# Patient Record
Sex: Female | Born: 1955 | Race: White | Hispanic: No | Marital: Married | Smoking: Former smoker
Health system: Southern US, Community
[De-identification: ages and names within clinical notes are randomized; demographics above are authoritative.]

## PROBLEM LIST (undated history)

## (undated) DIAGNOSIS — R05 Cough: Secondary | ICD-10-CM

## (undated) DIAGNOSIS — I10 Essential (primary) hypertension: Secondary | ICD-10-CM

## (undated) DIAGNOSIS — I6529 Occlusion and stenosis of unspecified carotid artery: Secondary | ICD-10-CM

## (undated) DIAGNOSIS — I5032 Chronic diastolic (congestive) heart failure: Secondary | ICD-10-CM

## (undated) DIAGNOSIS — Z951 Presence of aortocoronary bypass graft: Secondary | ICD-10-CM

## (undated) DIAGNOSIS — G479 Sleep disorder, unspecified: Secondary | ICD-10-CM

## (undated) DIAGNOSIS — Z8619 Personal history of other infectious and parasitic diseases: Secondary | ICD-10-CM

## (undated) DIAGNOSIS — R059 Cough, unspecified: Secondary | ICD-10-CM

## (undated) DIAGNOSIS — J45909 Unspecified asthma, uncomplicated: Secondary | ICD-10-CM

## (undated) DIAGNOSIS — T8859XA Other complications of anesthesia, initial encounter: Secondary | ICD-10-CM

## (undated) DIAGNOSIS — E785 Hyperlipidemia, unspecified: Secondary | ICD-10-CM

## (undated) DIAGNOSIS — I2510 Atherosclerotic heart disease of native coronary artery without angina pectoris: Secondary | ICD-10-CM

## (undated) DIAGNOSIS — E119 Type 2 diabetes mellitus without complications: Secondary | ICD-10-CM

## (undated) DIAGNOSIS — I779 Disorder of arteries and arterioles, unspecified: Secondary | ICD-10-CM

## (undated) DIAGNOSIS — Z9889 Other specified postprocedural states: Secondary | ICD-10-CM

## (undated) DIAGNOSIS — I739 Peripheral vascular disease, unspecified: Secondary | ICD-10-CM

## (undated) DIAGNOSIS — I639 Cerebral infarction, unspecified: Secondary | ICD-10-CM

## (undated) DIAGNOSIS — T4145XA Adverse effect of unspecified anesthetic, initial encounter: Secondary | ICD-10-CM

## (undated) DIAGNOSIS — G459 Transient cerebral ischemic attack, unspecified: Secondary | ICD-10-CM

## (undated) DIAGNOSIS — R112 Nausea with vomiting, unspecified: Secondary | ICD-10-CM

## (undated) DIAGNOSIS — I219 Acute myocardial infarction, unspecified: Secondary | ICD-10-CM

## (undated) HISTORY — DX: Chronic diastolic (congestive) heart failure: I50.32

## (undated) HISTORY — DX: Personal history of other infectious and parasitic diseases: Z86.19

## (undated) HISTORY — DX: Cerebral infarction, unspecified: I63.9

## (undated) HISTORY — DX: Atherosclerotic heart disease of native coronary artery without angina pectoris: I25.10

## (undated) HISTORY — DX: Occlusion and stenosis of unspecified carotid artery: I65.29

## (undated) HISTORY — PX: CORONARY ARTERY BYPASS GRAFT: SHX141

## (undated) HISTORY — PX: CHOLECYSTECTOMY: SHX55

## (undated) HISTORY — DX: Essential (primary) hypertension: I10

## (undated) HISTORY — DX: Presence of aortocoronary bypass graft: Z95.1

## (undated) HISTORY — DX: Hyperlipidemia, unspecified: E78.5

---

## 1979-09-20 HISTORY — PX: TUBAL LIGATION: SHX77

## 1989-05-20 HISTORY — PX: CHOLECYSTECTOMY: SHX55

## 2004-09-19 DIAGNOSIS — I639 Cerebral infarction, unspecified: Secondary | ICD-10-CM

## 2004-09-19 HISTORY — DX: Cerebral infarction, unspecified: I63.9

## 2011-08-26 ENCOUNTER — Encounter: Payer: Self-pay | Admitting: *Deleted

## 2011-08-26 ENCOUNTER — Emergency Department (HOSPITAL_BASED_OUTPATIENT_CLINIC_OR_DEPARTMENT_OTHER)
Admission: EM | Admit: 2011-08-26 | Discharge: 2011-08-26 | Disposition: A | Discharge status: Home / Self Care | Payer: BC Managed Care – PPO | Attending: Emergency Medicine | Admitting: Emergency Medicine

## 2011-08-26 ENCOUNTER — Emergency Department (INDEPENDENT_AMBULATORY_CARE_PROVIDER_SITE_OTHER): Payer: BC Managed Care – PPO

## 2011-08-26 DIAGNOSIS — R0602 Shortness of breath: Secondary | ICD-10-CM

## 2011-08-26 DIAGNOSIS — J189 Pneumonia, unspecified organism: Secondary | ICD-10-CM | POA: Insufficient documentation

## 2011-08-26 DIAGNOSIS — E119 Type 2 diabetes mellitus without complications: Secondary | ICD-10-CM | POA: Insufficient documentation

## 2011-08-26 DIAGNOSIS — I517 Cardiomegaly: Secondary | ICD-10-CM

## 2011-08-26 DIAGNOSIS — R609 Edema, unspecified: Secondary | ICD-10-CM

## 2011-08-26 LAB — BASIC METABOLIC PANEL
BUN: 15 mg/dL (ref 6–23)
CO2: 25 mEq/L (ref 19–32)
Chloride: 104 mEq/L (ref 96–112)
Creatinine, Ser: 0.6 mg/dL (ref 0.50–1.10)
GFR calc Af Amer: >90 mL/min (ref >90)
Glucose, Bld: 135 mg/dL — ABNORMAL HIGH (ref 70–99)
Potassium: 3.8 mEq/L (ref 3.5–5.1)

## 2011-08-26 LAB — CBC
HCT: 39.4 % (ref 36.0–46.0)
Hemoglobin: 13.3 g/dL (ref 12.0–15.0)
MCHC: 33.8 g/dL (ref 30.0–36.0)
RBC: 4.53 MIL/uL (ref 3.87–5.11)

## 2011-08-26 LAB — DIFFERENTIAL
Lymphocytes Relative: 5 % — ABNORMAL LOW (ref 12–46)
Lymphs Abs: 1 10*3/uL (ref 0.7–4.0)
Monocytes Absolute: 0.4 10*3/uL (ref 0.1–1.0)
Monocytes Relative: 2 % — ABNORMAL LOW (ref 3–12)
Neutro Abs: 17.7 10*3/uL — ABNORMAL HIGH (ref 1.7–7.7)
Neutrophils Relative %: 92 % — ABNORMAL HIGH (ref 43–77)

## 2011-08-26 MED ORDER — MOXIFLOXACIN HCL 400 MG PO TABS: 400.0000 mg | ORAL_TABLET | Freq: Every day | ORAL | Status: DC

## 2011-08-26 MED ORDER — DEXTROSE 5 % IV SOLN
1.0000 g | Freq: Once | INTRAVENOUS | Status: AC
  Administered 2011-08-26: 1 g via INTRAVENOUS
  Filled 2011-08-26: qty 10

## 2011-08-26 NOTE — Discharge Instructions (Signed)

## 2011-08-26 NOTE — ED Notes (Signed)
Walked to BR and tolerated well

## 2011-08-26 NOTE — ED Notes (Signed)
Asthmatic attack

## 2011-08-26 NOTE — ED Notes (Signed)
Feels better can now talk in full sentences

## 2011-08-26 NOTE — ED Notes (Signed)
Pt was tried off the oxygen to RA 21%; pt had an SPO2 drop of as low as 89% but maintained at 90%-93% on RA. Pt when she was auscultated had an oxygen saturation of 96% when taking big deep breaths.

## 2011-08-26 NOTE — ED Provider Notes (Signed)
History     CSN: [id number] Arrival date & time: 08/26/2011  5:26 PM   First MD Initiated Contact with Patient 08/26/11 1742      Chief Complaint  Patient presents with  . Asthma    (Consider location/radiation/quality/duration/timing/severity/associated sxs/prior treatment) HPI Comments: Pt states that she has been increasingly sob over the last month:pt states that she was diagnosed with pneumonia a month ago and they did a repeat x-ray and everything was normal:pt states that at that time they put her on oxygen at night:pt was given 2 treatment on the ems  Patient is a 55 y.o. female presenting with shortness of breath. The history is provided by the patient. No language interpreter was used.  Shortness of Breath  The current episode started more than 2 weeks ago. The problem occurs continuously. The problem has been gradually worsening. The problem is severe. The symptoms are relieved by beta-agonist inhalers. The symptoms are aggravated by activity. Associated symptoms include cough, shortness of breath and wheezing. Pertinent negatives include no chest pain and no fever. Her past medical history is significant for asthma.    Past Medical History  Diagnosis Date  . Asthma   . Diabetes mellitus     Past Surgical History  Procedure Date  . Cholecystectomy     Family History  Problem Relation Age of Onset  . Cancer Mother   . Heart failure Mother   . Diabetes Mother     History  Substance Use Topics  . Smoking status: Not on file  . Smokeless tobacco: Not on file  . Alcohol Use: No    OB History    Grav Para Term Preterm Abortions TAB SAB Ect Mult Living                  Review of Systems  Constitutional: Negative for fever.  Respiratory: Positive for cough, shortness of breath and wheezing.   Cardiovascular: Negative for chest pain.  All other systems reviewed and are negative.    Allergies  Review of patient's allergies indicates no known  allergies.  Home Medications   Current Outpatient Rx  Name Route Sig Dispense Refill  . ACETAMINOPHEN 500 MG PO TABS Oral Take 1,000 mg by mouth every 6 (six) hours as needed. For pain      . ALBUTEROL SULFATE (2.5 MG/3ML) 0.083% IN NEBU Nebulization Take 2.5 mg by nebulization 2 (two) times daily as needed. For shortness of breath and wheezing     . BUDESONIDE-FORMOTEROL FUMARATE 160-4.5 MCG/ACT IN AERO Inhalation Inhale 2 puffs into the lungs 2 (two) times daily.      . INSULIN GLARGINE 100 UNIT/ML [place] SOLN Subcutaneous Inject 100 Units into the skin 2 (two) times daily.      . INSULIN LISPRO (HUMAN) 100 UNIT/ML [place] SOLN Subcutaneous Inject 15 Units into the skin 3 (three) times daily before meals.        BP 155/61  Pulse 93  Temp(Src) 98.8 F (37.1 C) (Oral)  Resp 24  SpO2 93%  Physical Exam  Nursing note and vitals reviewed. Constitutional: She is oriented to person, place, and time. She appears well-developed and well-nourished.  HENT:  Head: Normocephalic and atraumatic.  Right Ear: External ear normal.  Left Ear: External ear normal.  Eyes: Pupils are equal, round, and reactive to light.  Neck: Normal range of motion. Neck supple.  Cardiovascular: Regular rhythm.   Pulmonary/Chest: No respiratory distress. She has wheezes.  Abdominal: Soft.  Musculoskeletal: Normal range of  motion.  Neurological: She is alert and oriented to person, place, and time.  Skin: Skin is warm and dry.  Psychiatric: She has a normal mood and affect.    ED Course  Procedures (including critical care time)  Labs Reviewed  CBC - Abnormal; Notable for the following:    WBC 19.1 (*)    All other components within normal limits  DIFFERENTIAL - Abnormal; Notable for the following:    Neutrophils Relative 92 (*)    Neutro Abs 17.7 (*)    Lymphocytes Relative 5 (*)    Monocytes Relative 2 (*)    All other components within normal limits  BASIC METABOLIC PANEL - Abnormal; Notable for the  following:    Glucose, Bld 135 (*)    All other components within normal limits   Dg Chest 2 View  08/26/2011  *RADIOLOGY REPORT*  Clinical Data: Shortness of breath.  CHEST - 2 VIEW  Comparison: None.  Findings: There is cardiomegaly.  The patient has bilateral alveolar and interstitial opacities.  Airspace opacity is most confluent in the right middle lobe.  No pneumothorax or effusion is identified.  IMPRESSION:   Cardiomegaly and interstitial edema.  More confluent opacity in the right middle lobe is likely due to more focal edema. Pneumonia cannot be excluded.  Original Report Authenticated By: Bernadene Bell. D'ALESSIO, M.D.     1. Pneumonia       MDM  Will treat pt for pneumonia:pt states that she has oxygen at home pt is 95-97% on oxygen:pt goes down to 89 % without JXB:JYNW have pt be on oxygen continuously:pt is requesting to go home;pt states that she is feeling better at this time:pt able to ambulate without actively being sob    Medical screening examination/treatment/procedure(s) were performed by non-physician practitioner and as supervising physician I was immediately available for consultation/collaboration. Osvaldo Human, M.D.      Teressa Lower, NP 08/26/11 2119  Carleene Cooper III, MD 08/26/11 2127

## 2011-08-29 MED FILL — Ipratropium-Albuterol Nebu Soln 0.5-2.5(3) MG/3ML: RESPIRATORY_TRACT | Qty: 3 | Status: AC

## 2011-08-29 MED FILL — Albuterol Sulfate Soln Nebu 0.083% (2.5 MG/3ML): RESPIRATORY_TRACT | Qty: 3 | Status: AC

## 2011-08-29 MED FILL — Methylprednisolone Sod Succ For Inj 125 MG (Base Equiv): INTRAMUSCULAR | Qty: 2 | Status: AC

## 2011-08-31 ENCOUNTER — Emergency Department (HOSPITAL_COMMUNITY)
Admission: EM | Admit: 2011-08-31 | Discharge: 2011-08-31 | Discharge status: Against Medical Advice | Payer: Self-pay | Attending: Emergency Medicine | Admitting: Emergency Medicine

## 2011-08-31 ENCOUNTER — Inpatient Hospital Stay (HOSPITAL_COMMUNITY)
Admission: EM | Admit: 2011-08-31 | Discharge: 2011-09-12 | DRG: 546 | Disposition: A | Discharge status: Home / Self Care | Payer: BC Managed Care – PPO | Source: Ambulatory Visit | Attending: Surgery | Admitting: Surgery

## 2011-08-31 ENCOUNTER — Other Ambulatory Visit: Payer: Self-pay

## 2011-08-31 ENCOUNTER — Encounter (HOSPITAL_COMMUNITY): Payer: Self-pay | Admitting: Internal Medicine

## 2011-08-31 ENCOUNTER — Emergency Department (HOSPITAL_COMMUNITY): Payer: BC Managed Care – PPO

## 2011-08-31 DIAGNOSIS — I10 Essential (primary) hypertension: Secondary | ICD-10-CM | POA: Diagnosis present

## 2011-08-31 DIAGNOSIS — I214 Non-ST elevation (NSTEMI) myocardial infarction: Principal | ICD-10-CM | POA: Diagnosis present

## 2011-08-31 DIAGNOSIS — Z79899 Other long term (current) drug therapy: Secondary | ICD-10-CM

## 2011-08-31 DIAGNOSIS — R0902 Hypoxemia: Secondary | ICD-10-CM | POA: Diagnosis present

## 2011-08-31 DIAGNOSIS — I509 Heart failure, unspecified: Secondary | ICD-10-CM | POA: Diagnosis present

## 2011-08-31 DIAGNOSIS — Z6841 Body Mass Index (BMI) 40.0 and over, adult: Secondary | ICD-10-CM

## 2011-08-31 DIAGNOSIS — E119 Type 2 diabetes mellitus without complications: Secondary | ICD-10-CM | POA: Diagnosis present

## 2011-08-31 DIAGNOSIS — R0989 Other specified symptoms and signs involving the circulatory and respiratory systems: Secondary | ICD-10-CM | POA: Insufficient documentation

## 2011-08-31 DIAGNOSIS — I5033 Acute on chronic diastolic (congestive) heart failure: Secondary | ICD-10-CM | POA: Diagnosis present

## 2011-08-31 DIAGNOSIS — T380X5A Adverse effect of glucocorticoids and synthetic analogues, initial encounter: Secondary | ICD-10-CM | POA: Diagnosis not present

## 2011-08-31 DIAGNOSIS — J189 Pneumonia, unspecified organism: Secondary | ICD-10-CM | POA: Diagnosis present

## 2011-08-31 DIAGNOSIS — J45909 Unspecified asthma, uncomplicated: Secondary | ICD-10-CM | POA: Diagnosis present

## 2011-08-31 DIAGNOSIS — J159 Unspecified bacterial pneumonia: Secondary | ICD-10-CM

## 2011-08-31 DIAGNOSIS — D72829 Elevated white blood cell count, unspecified: Secondary | ICD-10-CM | POA: Diagnosis present

## 2011-08-31 DIAGNOSIS — Z794 Long term (current) use of insulin: Secondary | ICD-10-CM

## 2011-08-31 DIAGNOSIS — K5900 Constipation, unspecified: Secondary | ICD-10-CM | POA: Diagnosis not present

## 2011-08-31 DIAGNOSIS — G4733 Obstructive sleep apnea (adult) (pediatric): Secondary | ICD-10-CM | POA: Diagnosis present

## 2011-08-31 DIAGNOSIS — R599 Enlarged lymph nodes, unspecified: Secondary | ICD-10-CM | POA: Diagnosis present

## 2011-08-31 DIAGNOSIS — I2510 Atherosclerotic heart disease of native coronary artery without angina pectoris: Secondary | ICD-10-CM | POA: Diagnosis present

## 2011-08-31 HISTORY — DX: Acute myocardial infarction, unspecified: I21.9

## 2011-08-31 HISTORY — DX: Nausea with vomiting, unspecified: R11.2

## 2011-08-31 HISTORY — DX: Sleep disorder, unspecified: G47.9

## 2011-08-31 HISTORY — DX: Other specified postprocedural states: Z98.890

## 2011-08-31 LAB — RAPID URINE DRUG SCREEN, HOSP PERFORMED
Benzodiazepines: NOT DETECTED
Cocaine: NOT DETECTED
Opiates: NOT DETECTED

## 2011-08-31 LAB — DIFFERENTIAL
Basophils Relative: 1 % (ref 0–1)
Eosinophils Relative: 2 % (ref 0–5)
Monocytes Absolute: 0.5 10*3/uL (ref 0.1–1.0)
Monocytes Relative: 4 % (ref 3–12)
Neutro Abs: 8 10*3/uL — ABNORMAL HIGH (ref 1.7–7.7)

## 2011-08-31 LAB — POCT I-STAT, CHEM 8
BUN: 14 mg/dL (ref 6–23)
Calcium, Ion: 1.12 mmol/L (ref 1.12–1.32)
Chloride: 109 mEq/L (ref 96–112)
Glucose, Bld: 306 mg/dL — ABNORMAL HIGH (ref 70–99)
HCT: 44 % (ref 36.0–46.0)
Potassium: 4.4 mEq/L (ref 3.5–5.1)

## 2011-08-31 LAB — TROPONIN I: Troponin I: <0.3 ng/mL (ref <0.30)

## 2011-08-31 LAB — URINALYSIS, ROUTINE W REFLEX MICROSCOPIC
Bilirubin Urine: NEGATIVE
Hgb urine dipstick: NEGATIVE
Specific Gravity, Urine: 1.021 (ref 1.005–1.030)
pH: 6 (ref 5.0–8.0)

## 2011-08-31 LAB — URINE MICROSCOPIC-ADD ON

## 2011-08-31 LAB — CBC
HCT: 39.5 % (ref 36.0–46.0)
Hemoglobin: 12.6 g/dL (ref 12.0–15.0)
MCH: 28.4 pg (ref 26.0–34.0)
MCHC: 31.9 g/dL (ref 30.0–36.0)
MCV: 89 fL (ref 78.0–100.0)
RDW: 13.8 % (ref 11.5–15.5)

## 2011-08-31 LAB — BLOOD GAS, ARTERIAL
Bicarbonate: 21.5 mEq/L (ref 20.0–24.0)
TCO2: 19.3 mmol/L (ref 0–100)
pCO2 arterial: 39.7 mmHg (ref 35.0–45.0)
pH, Arterial: 7.354 (ref 7.350–7.400)
pO2, Arterial: 271 mmHg — ABNORMAL HIGH (ref 80.0–100.0)

## 2011-08-31 LAB — COMPREHENSIVE METABOLIC PANEL
Albumin: 3.4 g/dL — ABNORMAL LOW (ref 3.5–5.2)
BUN: 13 mg/dL (ref 6–23)
CO2: 21 mEq/L (ref 19–32)
Calcium: 9 mg/dL (ref 8.4–10.5)
Chloride: 102 mEq/L (ref 96–112)
Creatinine, Ser: 0.75 mg/dL (ref 0.50–1.10)
GFR calc non Af Amer: >90 mL/min (ref >90)
Total Bilirubin: 0.1 mg/dL — ABNORMAL LOW (ref 0.3–1.2)

## 2011-08-31 LAB — GLUCOSE, CAPILLARY: Glucose-Capillary: 225 mg/dL — ABNORMAL HIGH (ref 70–99)

## 2011-08-31 LAB — PRO B NATRIURETIC PEPTIDE: Pro B Natriuretic peptide (BNP): 320.3 pg/mL — ABNORMAL HIGH (ref 0–125)

## 2011-08-31 MED ORDER — AZITHROMYCIN 500 MG IV SOLR
500.0000 mg | Freq: Once | INTRAVENOUS | Status: AC
  Administered 2011-08-31: 500 mg via INTRAVENOUS
  Filled 2011-08-31: qty 500

## 2011-08-31 MED ORDER — FUROSEMIDE 10 MG/ML IJ SOLN
40.0000 mg | Freq: Once | INTRAMUSCULAR | Status: AC
  Administered 2011-08-31: 40 mg via INTRAVENOUS
  Filled 2011-08-31: qty 4

## 2011-08-31 MED ORDER — DEXTROSE 5 % IV SOLN
1.0000 g | Freq: Once | INTRAVENOUS | Status: AC
  Administered 2011-08-31: 1 g via INTRAVENOUS
  Filled 2011-08-31: qty 10

## 2011-08-31 MED ORDER — IOHEXOL 300 MG/ML  SOLN
100.0000 mL | Freq: Once | INTRAMUSCULAR | Status: AC | PRN
  Administered 2011-08-31: 100 mL via INTRAVENOUS

## 2011-08-31 NOTE — ED Notes (Signed)
Ct and Rt made aware that pt is ready to go for CT angio

## 2011-08-31 NOTE — ED Notes (Signed)
Per CT pt need IV access above the hand and wrist

## 2011-08-31 NOTE — ED Notes (Signed)
Per ems. Found pt sitting in her car at a gas station, obvious respiratory distress. Extremely diaphoretic and rales in all fields. Applied cpap, immediately lung sounds started to clear, but not compeltely. One nitroglycerin. 20 g R hand. Alert and oriented x4 whole time. Pt reported hx of dx of pneumonia last Friday. Denies allergies.

## 2011-08-31 NOTE — ED Notes (Signed)
Per Dr. Conley Rolls pt transported to CT on 100% NRB.  Pt spo2 maintained between 94-100%.  Per Dr. Conley Rolls pt left on 100% NRB and is tolerating well. VS stable at this time.  RT will continue to follow.

## 2011-08-31 NOTE — Progress Notes (Signed)
Documented on wrong pt.Needed to document on a [place]

## 2011-08-31 NOTE — ED Notes (Signed)
Pt and bedding changed due to pt being incontinent of urine. Foley placed by assigned RN

## 2011-08-31 NOTE — ED Notes (Signed)
MD at bedside. Dr. Le  

## 2011-08-31 NOTE — ED Provider Notes (Signed)
History     CSN: [id number] Arrival date & time: 08/31/2011  5:28 PM   First MD Initiated Contact with Patient 08/31/11 1734      Chief Complaint  Patient presents with  . Respiratory Distress    (Consider location/radiation/quality/duration/timing/severity/associated sxs/prior treatment) Patient is a 55 y.o. female presenting with shortness of breath. The history is provided by the patient.  Shortness of Breath  The current episode started 3 to 5 days ago. The onset was gradual. The problem occurs continuously. The problem has been rapidly worsening. The problem is severe. The symptoms are relieved by nothing. The symptoms are aggravated by nothing. Associated symptoms include orthopnea, cough and shortness of breath. Pertinent negatives include no chest pain, no chest pressure, no fever, no rhinorrhea and no wheezing. She has been behaving normally. Urine output has been normal. The last void occurred less than 6 hours ago. There were no sick contacts. Recently, medical care has been given by the PCP. Services received include medications given (Treated with Avelox for 5 days in the emergency department).   The patient was driving her car home tonight, when she had sudden worsening of her breathing problem. She was assessed in the field by EMS and put on CPAP. She reports feeling better after on CPAP. She denies any chest pain in the last week. She has chronic lower extremity edema. She feels she has not gotten any better on the Avelox.  Past Medical History  Diagnosis Date  . Asthma   . Diabetes mellitus     Past Surgical History  Procedure Date  . Cholecystectomy     Family History  Problem Relation Age of Onset  . Cancer Mother   . Heart failure Mother   . Diabetes Mother     History  Substance Use Topics  . Smoking status: Not on file  . Smokeless tobacco: Not on file  . Alcohol Use: No    OB History    Grav Para Term Preterm Abortions TAB SAB Ect Mult Living               Review of Systems  Constitutional: Negative for fever.  HENT: Negative for rhinorrhea.   Respiratory: Positive for cough and shortness of breath. Negative for wheezing.   Cardiovascular: Positive for orthopnea. Negative for chest pain.  All other systems reviewed and are negative.    Allergies  Review of patient's allergies indicates no known allergies.  Home Medications   Current Outpatient Rx  Name Route Sig Dispense Refill  . ACETAMINOPHEN 500 MG PO TABS Oral Take 1,000 mg by mouth every 6 (six) hours as needed. For pain      . ALBUTEROL SULFATE (2.5 MG/3ML) 0.083% IN NEBU Nebulization Take 2.5 mg by nebulization 2 (two) times daily as needed. For shortness of breath and wheezing     . ALBUTEROL SULFATE (5 MG/ML) 0.5% IN NEBU Nebulization Take 10 mg by nebulization once.      . BUDESONIDE-FORMOTEROL FUMARATE 160-4.5 MCG/ACT IN AERO Inhalation Inhale 2 puffs into the lungs 2 (two) times daily.      . INSULIN GLARGINE 100 UNIT/ML [place] SOLN Subcutaneous Inject 100 Units into the skin 2 (two) times daily.      . INSULIN LISPRO (HUMAN) 100 UNIT/ML [place] SOLN Subcutaneous Inject 15 Units into the skin 3 (three) times daily before meals.      . IPRATROPIUM BROMIDE 0.02 % IN SOLN Nebulization Take 500 mcg by nebulization once.      [name]  METHYLPREDNISOLONE SODIUM SUCC 125 MG IJ SOLR Intravenous Inject 125 mg into the vein once.      [name] MOXIFLOXACIN HCL 400 MG PO TABS Oral Take 1 tablet (400 mg total) by mouth daily. 5 tablet 0    BP 127/78  Pulse 79  Resp 29  SpO2 100%  Physical Exam  Constitutional: She is oriented to person, place, and time. She appears well-developed.       Obese  HENT:  Head: Normocephalic and atraumatic.  Right Ear: External ear normal.  Left Ear: External ear normal.  Eyes: Conjunctivae and EOM are normal. Pupils are equal, round, and reactive to light. Left eye exhibits no discharge.  Neck: Normal range of motion. Neck supple.  Cardiovascular: Normal  rate and regular rhythm.   Pulmonary/Chest: She is in respiratory distress. She has no wheezes. She has no rales. She exhibits no tenderness.  Abdominal: Soft. Bowel sounds are normal. She exhibits no distension. There is no tenderness.  Musculoskeletal: She exhibits edema (Symmetric lower extremity edema, mild.). She exhibits no tenderness.  Neurological: She is alert and oriented to person, place, and time. No cranial nerve deficit. She exhibits normal muscle tone. Coordination normal.  Skin: Skin is warm and dry.  Psychiatric: She has a normal mood and affect. Her behavior is normal. Judgment and thought content normal.    ED Course  Procedures (including critical care time)  CRITICAL CARE Performed by: Mancel Bale L   Total critical care time: 50  Critical care time was exclusive of separately billable procedures and treating other patients.  Critical care was necessary to treat or prevent imminent or life-threatening deterioration.  Critical care was time spent personally by me on the following activities: development of treatment plan with patient and/or surrogate as well as nursing, discussions with consultants, evaluation of patient's response to treatment, examination of patient, obtaining history from patient or surrogate, ordering and performing treatments and interventions, ordering and review of laboratory studies, ordering and review of radiographic studies, pulse oximetry and re-evaluation of patient's condition.  Date: 08/31/2011  Rate: 103  Rhythm: sinus tachycardia  QRS Axis: left  Intervals: normal  ST/T Wave abnormalities: nonspecific ST changes  Conduction Disutrbances:left anterior fascicular block  Narrative Interpretation: LVH, Q wave anterior  Old EKG Reviewed: none available  18:30 Initial ED management. Change from CPAP to BiPAP. Arterial blood gas checked and is reassuring. Weaning from BiPAP underway at this time. 20:30- she did not tolerate weaning  from BiPAP. IV Lasix ordered for apparent CHF. IV antibiotics, started for persistent community-acquired pneumonia. Patient maintained good blood pressure during observation, but was persistently tachypneic.     Labs Reviewed  CBC - Abnormal; Notable for the following:    WBC 10.7 (*)    All other components within normal limits  DIFFERENTIAL - Abnormal; Notable for the following:    Neutro Abs 8.0 (*)    All other components within normal limits  COMPREHENSIVE METABOLIC PANEL - Abnormal; Notable for the following:    Glucose, Bld 298 (*)    Albumin 3.4 (*)    AST 70 (*)    ALT 54 (*)    Alkaline Phosphatase 194 (*)    Total Bilirubin 0.1 (*)    All other components within normal limits  URINE RAPID DRUG SCREEN (HOSP PERFORMED) - Abnormal; Notable for the following:    Barbiturates POSITIVE (*)    All other components within normal limits  URINALYSIS, ROUTINE W REFLEX MICROSCOPIC - Abnormal; Notable for the following:  Glucose, UA 250 (*)    Protein, ur 100 (*)    All other components within normal limits  BLOOD GAS, ARTERIAL - Abnormal; Notable for the following:    pO2, Arterial 271.0 (*)    Acid-base deficit 3.2 (*)    All other components within normal limits  PRO B NATRIURETIC PEPTIDE - Abnormal; Notable for the following:    Pro B Natriuretic peptide (BNP) 320.3 (*)    All other components within normal limits  GLUCOSE, CAPILLARY - Abnormal; Notable for the following:    Glucose-Capillary 295 (*)    All other components within normal limits  POCT I-STAT TROPONIN I - Abnormal; Notable for the following:    Troponin i, poc 0.28 (*)    All other components within normal limits  POCT I-STAT, CHEM 8 - Abnormal; Notable for the following:    Glucose, Bld 306 (*)    All other components within normal limits  URINE MICROSCOPIC-ADD ON - Abnormal; Notable for the following:    Squamous Epithelial / LPF FEW (*)    Casts HYALINE CASTS (*)    All other components within normal  limits  TROPONIN I  I-STAT, CHEM 8  I-STAT TROPONIN I  URINE CULTURE  CULTURE, BLOOD (ROUTINE X 2)  CULTURE, BLOOD (ROUTINE X 2)   Dg Chest Port 1 View  08/31/2011  *RADIOLOGY REPORT*  Clinical Data: Short of breath.  Respiratory distress  PORTABLE CHEST - 1 VIEW  Comparison: 08/26/2011  Findings: Progression of diffuse bilateral airspace disease.  This may be due to pulmonary edema versus pneumonia.  No significant pleural effusion.  IMPRESSION: Progression of diffuse bilateral airspace disease, probable pulmonary edema.  Original Report Authenticated By: Camelia Phenes, M.D.     1. CHF (congestive heart failure)   2. Community acquired bacterial pneumonia       MDM  Nonspecific, shortness of breath with concern for combined CHF, and pneumonia. Patient is in persistent respiratory distress. Will have to be admitted for further treatment and management.        Flint Melter, MD 08/31/11 2122

## 2011-08-31 NOTE — ED Notes (Signed)
JYN:WGNF<AO> Expected date:08/31/11<BR> Expected time: 5:25 PM<BR> Means of arrival:Ambulance<BR> Comments:<BR> EMS 61 GC - Pneumonia/low sats/need respiratory

## 2011-08-31 NOTE — ED Notes (Signed)
Dr. Conley Rolls notified of pt being placed on NRB and tolerating well. Pt. Maintaining sats at 100%. OK for pt to stay on NRB per Dr. Conley Rolls.

## 2011-08-31 NOTE — ED Notes (Signed)
Dr. Conley Rolls notified of pt accucheck of 225 mg/dl. No further orders received.

## 2011-08-31 NOTE — ED Notes (Signed)
Pt is aware of the need for urine. Reporting EMT states that MD was asked for foley and MD wanted to wait.

## 2011-08-31 NOTE — ED Notes (Signed)
Patient transported to CT on Non rebreather per Dr Conley Rolls

## 2011-08-31 NOTE — ED Notes (Signed)
Pt. Denies chest pain. Pt. Has no complaints at this time. Continues on Bi-Pap.

## 2011-08-31 NOTE — ED Notes (Signed)
Pt. Confirms that she takes 100U Lantus twice daily. Dr. Conley Rolls aware. Pt a/o x 3. No acute distress.

## 2011-09-01 ENCOUNTER — Encounter (HOSPITAL_COMMUNITY): Payer: Self-pay

## 2011-09-01 DIAGNOSIS — I509 Heart failure, unspecified: Secondary | ICD-10-CM

## 2011-09-01 DIAGNOSIS — I214 Non-ST elevation (NSTEMI) myocardial infarction: Secondary | ICD-10-CM | POA: Diagnosis present

## 2011-09-01 DIAGNOSIS — I369 Nonrheumatic tricuspid valve disorder, unspecified: Secondary | ICD-10-CM

## 2011-09-01 LAB — BASIC METABOLIC PANEL
BUN: 15 mg/dL (ref 6–23)
Chloride: 101 mEq/L (ref 96–112)
Creatinine, Ser: 0.8 mg/dL (ref 0.50–1.10)
GFR calc Af Amer: >90 mL/min (ref >90)
Glucose, Bld: 232 mg/dL — ABNORMAL HIGH (ref 70–99)

## 2011-09-01 LAB — CARDIAC PANEL(CRET KIN+CKTOT+MB+TROPI)
CK, MB: 18 ng/mL (ref 0.3–4.0)
Relative Index: 7.8 — ABNORMAL HIGH (ref 0.0–2.5)
Relative Index: 7.7 — ABNORMAL HIGH (ref 0.0–2.5)
Total CK: 234 U/L — ABNORMAL HIGH (ref 7–177)
Troponin I: 2.71 ng/mL (ref <0.30)

## 2011-09-01 LAB — CBC
HCT: 38.3 % (ref 36.0–46.0)
Hemoglobin: 12.4 g/dL (ref 12.0–15.0)
MCH: 28.8 pg (ref 26.0–34.0)
MCHC: 32.4 g/dL (ref 30.0–36.0)
MCV: 89.1 fL (ref 78.0–100.0)
RDW: 14.2 % (ref 11.5–15.5)

## 2011-09-01 LAB — APTT: aPTT: 29 seconds (ref 24–37)

## 2011-09-01 LAB — PROTIME-INR: INR: 1.06 (ref 0.00–1.49)

## 2011-09-01 LAB — GLUCOSE, CAPILLARY: Glucose-Capillary: 249 mg/dL — ABNORMAL HIGH (ref 70–99)

## 2011-09-01 LAB — HEMOGLOBIN A1C: Hgb A1c MFr Bld: 8.1 % — ABNORMAL HIGH (ref <5.7)

## 2011-09-01 MED ORDER — DEXTROSE 5 % IV SOLN
500.0000 mg | INTRAVENOUS | Status: DC
  Administered 2011-09-01: 500 mg via INTRAVENOUS
  Filled 2011-09-01 (×2): qty 500

## 2011-09-01 MED ORDER — DEXTROSE 5 % IV SOLN
1.0000 g | INTRAVENOUS | Status: DC
  Administered 2011-09-01: 1 g via INTRAVENOUS
  Filled 2011-09-01 (×2): qty 10

## 2011-09-01 MED ORDER — HEPARIN BOLUS VIA INFUSION
4000.0000 [IU] | Freq: Once | INTRAVENOUS | Status: DC
  Administered 2011-09-01: 4000 [IU] via INTRAVENOUS
  Filled 2011-09-01: qty 4000

## 2011-09-01 MED ORDER — INSULIN GLARGINE 100 UNIT/ML ~~LOC~~ SOLN
50.0000 [IU] | Freq: Two times a day (BID) | SUBCUTANEOUS | Status: DC
  Administered 2011-09-01 – 2011-09-03 (×3): 50 [IU] via SUBCUTANEOUS
  Filled 2011-09-01: qty 3
  Filled 2011-09-01: qty 0.5

## 2011-09-01 MED ORDER — SODIUM CHLORIDE 0.9 % IJ SOLN
3.0000 mL | Freq: Two times a day (BID) | INTRAMUSCULAR | Status: DC
  Administered 2011-09-03 – 2011-09-04 (×2): 3 mL via INTRAVENOUS

## 2011-09-01 MED ORDER — FUROSEMIDE 10 MG/ML IJ SOLN
40.0000 mg | Freq: Every day | INTRAMUSCULAR | Status: DC
  Administered 2011-09-01: 40 mg via INTRAVENOUS
  Filled 2011-09-01 (×2): qty 2
  Filled 2011-09-01 (×2): qty 4

## 2011-09-01 MED ORDER — CARVEDILOL 3.125 MG PO TABS
3.1250 mg | ORAL_TABLET | Freq: Two times a day (BID) | ORAL | Status: DC
  Administered 2011-09-01 – 2011-09-06 (×10): 3.125 mg via ORAL
  Filled 2011-09-01 (×16): qty 1

## 2011-09-01 MED ORDER — ASPIRIN EC 325 MG PO TBEC
325.0000 mg | DELAYED_RELEASE_TABLET | Freq: Every day | ORAL | Status: DC
  Administered 2011-09-01: 325 mg via ORAL
  Filled 2011-09-01 (×2): qty 1

## 2011-09-01 MED ORDER — SODIUM CHLORIDE 0.9 % IJ SOLN: 3.0000 mL | INTRAMUSCULAR | Status: DC | PRN

## 2011-09-01 MED ORDER — HEPARIN SOD (PORCINE) IN D5W 100 UNIT/ML IV SOLN
1650.0000 [IU]/h | INTRAVENOUS | Status: DC
  Administered 2011-09-01: 1200 [IU]/h via INTRAVENOUS
  Administered 2011-09-02: 1650 [IU]/h via INTRAVENOUS
  Filled 2011-09-01 (×4): qty 250

## 2011-09-01 MED ORDER — ACETAMINOPHEN 500 MG PO TABS: 1000.0000 mg | ORAL_TABLET | Freq: Four times a day (QID) | ORAL | Status: DC | PRN

## 2011-09-01 MED ORDER — SODIUM CHLORIDE 0.9 % IV SOLN: 250.0000 mL | INTRAVENOUS | Status: DC | PRN

## 2011-09-01 MED ORDER — METHYLPREDNISOLONE SODIUM SUCC 125 MG IJ SOLR
60.0000 mg | Freq: Four times a day (QID) | INTRAMUSCULAR | Status: DC
  Administered 2011-09-01 (×3): 60 mg via INTRAVENOUS
  Administered 2011-09-01: 09:00:00 via INTRAVENOUS
  Administered 2011-09-02: 60 mg via INTRAVENOUS
  Filled 2011-09-01 (×2): qty 2
  Filled 2011-09-01: qty 4
  Filled 2011-09-01 (×6): qty 2

## 2011-09-01 MED ORDER — ASPIRIN 81 MG PO CHEW
324.0000 mg | CHEWABLE_TABLET | ORAL | Status: AC
  Administered 2011-09-02: 324 mg via ORAL
  Filled 2011-09-01: qty 4

## 2011-09-01 MED ORDER — DIAZEPAM 5 MG PO TABS
5.0000 mg | ORAL_TABLET | ORAL | Status: AC
  Administered 2011-09-02: 5 mg via ORAL
  Filled 2011-09-01: qty 1

## 2011-09-01 MED ORDER — ROSUVASTATIN CALCIUM 40 MG PO TABS
40.0000 mg | ORAL_TABLET | Freq: Every day | ORAL | Status: DC
  Administered 2011-09-04 – 2011-09-11 (×7): 40 mg via ORAL
  Filled 2011-09-01 (×11): qty 1

## 2011-09-01 MED ORDER — ALBUTEROL SULFATE (5 MG/ML) 0.5% IN NEBU
2.5000 mg | INHALATION_SOLUTION | Freq: Four times a day (QID) | RESPIRATORY_TRACT | Status: DC
  Administered 2011-09-01 – 2011-09-03 (×8): 2.5 mg via RESPIRATORY_TRACT
  Filled 2011-09-01 (×8): qty 0.5

## 2011-09-01 MED ORDER — MORPHINE SULFATE 2 MG/ML IJ SOLN: 2.0000 mg | INTRAMUSCULAR | Status: DC | PRN

## 2011-09-01 MED ORDER — ROSUVASTATIN CALCIUM 40 MG PO TABS
40.0000 mg | ORAL_TABLET | ORAL | Status: AC
  Administered 2011-09-01: 40 mg via ORAL
  Filled 2011-09-01 (×2): qty 1

## 2011-09-01 MED ORDER — SODIUM CHLORIDE 0.9 % IJ SOLN
3.0000 mL | Freq: Two times a day (BID) | INTRAMUSCULAR | Status: DC
  Administered 2011-09-01 – 2011-09-04 (×4): 3 mL via INTRAVENOUS

## 2011-09-01 MED ORDER — BUDESONIDE-FORMOTEROL FUMARATE 160-4.5 MCG/ACT IN AERO
2.0000 | INHALATION_SPRAY | Freq: Two times a day (BID) | RESPIRATORY_TRACT | Status: DC
  Administered 2011-09-01 – 2011-09-03 (×3): 2 via RESPIRATORY_TRACT
  Filled 2011-09-01 (×2): qty 6

## 2011-09-01 MED ORDER — INSULIN ASPART 100 UNIT/ML ~~LOC~~ SOLN
0.0000 [IU] | Freq: Three times a day (TID) | SUBCUTANEOUS | Status: DC
  Administered 2011-09-01: 08:00:00 via SUBCUTANEOUS

## 2011-09-01 MED ORDER — INSULIN GLARGINE 100 UNIT/ML ~~LOC~~ SOLN: 100.0000 [IU] | Freq: Every day | SUBCUTANEOUS | Status: DC

## 2011-09-01 MED ORDER — INSULIN ASPART 100 UNIT/ML ~~LOC~~ SOLN
0.0000 [IU] | SUBCUTANEOUS | Status: DC
  Administered 2011-09-01 – 2011-09-02 (×5): 8 [IU] via SUBCUTANEOUS
  Filled 2011-09-01: qty 0.15

## 2011-09-01 MED ORDER — ALBUTEROL SULFATE (5 MG/ML) 0.5% IN NEBU: 2.5000 mg | INHALATION_SOLUTION | RESPIRATORY_TRACT | Status: DC | PRN

## 2011-09-01 MED ORDER — INSULIN GLARGINE 100 UNIT/ML ~~LOC~~ SOLN
100.0000 [IU] | Freq: Two times a day (BID) | SUBCUTANEOUS | Status: DC
  Administered 2011-09-01 (×2): 100 [IU] via SUBCUTANEOUS
  Filled 2011-09-01 (×2): qty 1

## 2011-09-01 MED ORDER — INSULIN ASPART 100 UNIT/ML ~~LOC~~ SOLN
6.0000 [IU] | Freq: Three times a day (TID) | SUBCUTANEOUS | Status: DC
  Administered 2011-09-01: 08:00:00 via SUBCUTANEOUS

## 2011-09-01 MED ORDER — INSULIN ASPART 100 UNIT/ML ~~LOC~~ SOLN: 0.0000 [IU] | Freq: Every day | SUBCUTANEOUS | Status: DC

## 2011-09-01 MED ORDER — ENOXAPARIN SODIUM 40 MG/0.4ML ~~LOC~~ SOLN: 40.0000 mg | SUBCUTANEOUS | Status: DC

## 2011-09-01 NOTE — ED Notes (Signed)
Pt transferred to dept.

## 2011-09-01 NOTE — ED Notes (Signed)
Dr Conley Rolls at bs with pt to discuss Ct results. Reports that he will change order for pt to go to tele bed

## 2011-09-01 NOTE — Progress Notes (Signed)
ANTICOAGULATION CONSULT NOTE - Initial Consult  Pharmacy Consult for IV heparin Indication: ACS/STEMI  No Known Allergies  Patient Measurements:   Patient's reported weight = 323 lbs = 146.8 kg Ideal body weight = 61.6 Heparin dosing weight = 98 kg  Vital Signs: Temp: 98.7 F (37.1 C) (12/13 1145) BP: 146/55 mmHg (12/13 1145) Pulse Rate: 97  (12/13 1145)  Labs:  Basename 09/01/11 1013 09/01/11 0500 08/31/11 1802 08/31/11 1737 08/31/11 1712  HGB -- 12.4 15.0 -- --  HCT -- 38.3 44.0 39.5 --  PLT -- 250 -- 235 --  APTT -- -- -- -- --  LABPROT -- -- -- -- --  INR -- -- -- -- --  HEPARINUNFRC -- -- -- -- --  CREATININE -- 0.80 0.80 0.75 --  CKTOTAL 339* -- -- -- --  CKMB 26.4* -- -- -- --  TROPONINI 1.44* -- -- -- <0.30   CrCl is unknown because there is no height on file for the current visit.  Medical History: Past Medical History  Diagnosis Date  . Asthma   . Diabetes mellitus   . PONV (postoperative nausea and vomiting)   . Sleep disturbance     Had sleep study several years ago, wasn't told she has OSA but told she needs O2 at night  . Abnormal stress test     Failed stress test 3 years ago in [facility] for cough -  had f/u echo showing a "valve didn't open well" . No cath    Medications:  Scheduled:    . albuterol  2.5 mg Nebulization Q6H  . aspirin EC  325 mg Oral Daily  . azithromycin (ZITHROMAX) 500 MG IVPB  500 mg Intravenous Once  . azithromycin  500 mg Intravenous Q24H  . budesonide-formoterol  2 puff Inhalation BID  . carvedilol  3.125 mg Oral BID WC  . cefTRIAXone (ROCEPHIN)  IV  1 g Intravenous Once  . cefTRIAXone (ROCEPHIN)  IV  1 g Intravenous Q24H  . furosemide  40 mg Intravenous Once  . furosemide  40 mg Intravenous Daily  . insulin aspart  0-15 Units Subcutaneous Q4H  . insulin glargine  50 Units Subcutaneous BID  . methylPREDNISolone (SOLU-MEDROL) injection  60 mg Intravenous Q6H  . sodium chloride  3 mL Intravenous Q12H  . DISCONTD:  enoxaparin  40 mg Subcutaneous Q24H  . DISCONTD: insulin aspart  0-20 Units Subcutaneous TID WC  . DISCONTD: insulin aspart  0-5 Units Subcutaneous QHS  . DISCONTD: insulin aspart  6 Units Subcutaneous TID WC  . DISCONTD: insulin glargine  100 Units Subcutaneous BID  . DISCONTD: insulin glargine  100 Units Subcutaneous QHS   Infusions:    Assessment:  55YOF admitted with SOB found to have elevated cardiac enzymes.  Pt is to start IV heparin to r/o ACS/STEMI.    CBC stable  Goal of Therapy:  Heparin level 0.3-0.7 units/ml   Plan:   RN notified to obtain baseline PT/INR and PTT  Heparin 4000 units IV x 1 as bolus then heparin 1200 units/hr  Heparin level 6 hours after start of infusion  Daily heparin level and CBC  Marshall Cork, Jerell Demery Thi 09/01/2011,2:43 PM

## 2011-09-01 NOTE — Progress Notes (Signed)
ANTICOAGULATION CONSULT NOTE - Follow Up Consult  Pharmacy Consult for Heparin Indication: chest pain/ACS  No Known Allergies  Patient Measurements: Weight: 314 lb 13.1 oz (142.8 kg) Adjusted Body Weight:   Vital Signs: Temp: 98.7 F (37.1 C) (12/13 1145) BP: 146/55 mmHg (12/13 1145) Pulse Rate: 97  (12/13 1145)  Labs:  Basename 09/01/11 2225 09/01/11 1630 09/01/11 1503 09/01/11 1013 09/01/11 0500 08/31/11 1802 08/31/11 1737 08/31/11 1712  HGB -- -- -- -- 12.4 15.0 -- --  HCT -- -- -- -- 38.3 44.0 39.5 --  PLT -- -- -- -- 250 -- 235 --  APTT -- -- 29 -- -- -- -- --  LABPROT -- -- 14.0 -- -- -- -- --  INR -- -- 1.06 -- -- -- -- --  HEPARINUNFRC <0.10* -- -- -- -- -- -- --  CREATININE -- -- -- -- 0.80 0.80 0.75 --  CKTOTAL -- 234* -- 339* -- -- -- --  CKMB -- 18.0* -- 26.4* -- -- -- --  TROPONINI -- 2.71* -- 1.44* -- -- -- <0.30   CrCl is unknown because there is no height on file for the current visit.    Assessment: Patient on Heparin for ACS, 1st level is below goal.  No issues per RN.  Goal of Therapy:  Heparin level 0.3-0.7 units/ml   Plan:  Increase heparin to 1650 units/hr, recheck level at 0800  Darlina Guys, Jacquenette Shone Crowford 09/01/2011,11:37 PM

## 2011-09-01 NOTE — Consult Note (Signed)
CARDIOLOGY CONSULT NOTE  Patient ID: Felicia Rivas, MRN: [id number], DOB/AGE: 1956/04/28 55 y.o. Admit date: 08/31/2011 Date of Consult: 09/01/2011  Primary Cardiologist: New  Chief Complaint: SOB Reason for Consultation: NSTEMI, CHF  HPI: 45 y/o F with hx DM, abnormal stress test 3 yrs ago but no cath presented to [facility] c/o SOB. She works as a Runner, broadcasting/film/video at [employer] and after getting off work, she was driving home and became progressively SOB to the point where she had to stop at a gas station to call for help. She was also very diaphoretic. She denies any chest pain either now or in the past. She doesn't typically get DOE, except when she's sick with a URI. She hasn't felt well for the past several weeks, with increasing LEE& dyspnea on exertion. She saw her PCP on 12/7 for SOB/cough and was dx'd with PNA and given IV steroids/abx then prescribed 5 days of Avelox which she completed. No fevers, chills, nausea, vomiting, syncope or palpitations. She notes that her weight was 290 on 12/7 and then at her f/u on 12/10, it was 323. She hasn't noticed any early satiety or change in the way her clothing fits, but says she wears loose clothing in general. She states she is urinating normally. She received 2 doses of IV Lasix 40mg  each thus far, and she can't say whether it has made a difference. BNP was 320.  Initial EKG 12/12 17:28pm showed sinus tach with marked ST depression V4-V6, ST depression avL. Difficult to assess for ST elevation given wandering baseline. F/u EKG today shows improvement of these findings with mild ST depression V5-V6 with general TW flattening V5, biphasic T in V6 with evidence for LVH.   Past Medical History  Diagnosis Date  . Asthma   . Diabetes mellitus   . PONV (postoperative nausea and vomiting)   . Sleep disturbance     Had sleep study several years ago, wasn't told she has OSA but told she needs O2 at night  . Abnormal stress test     Failed stress test 3 years  ago in [facility] for cough -  had f/u echo showing a "valve didn't open well" . No cath      Surgical History:  Past Surgical History  Procedure Date  . Cholecystectomy      Home Meds: Medication Sig  acetaminophen (TYLENOL) 500 MG tablet Take 1,000 mg by mouth every 6 (six) hours as needed. For pain    albuterol (PROVENTIL) (2.5 MG/3ML) 0.083% nebulizer solution Take 2.5 mg by nebulization 2 (two) times daily as needed. For shortness of breath and wheezing   budesonide-formoterol (SYMBICORT) 160-4.5 MCG/ACT inhaler Inhale 2 puffs into the lungs 2 (two) times daily.   insulin glargine (LANTUS) 100 UNIT/ML injection Inject 100 Units into the skin 2 (two) times daily.    insulin lispro (HUMALOG) 100 UNIT/ML injection Inject 15 Units into the skin 3 (three) times daily before meals.    moxifloxacin (AVELOX) 400 MG tablet Take 400 mg by mouth daily. Finished 08/31/11 after a 5 day course of therapy.   albuterol (PROVENTIL) (5 MG/ML) 0.5% nebulizer solution Take 10 mg by nebulization once.    ipratropium (ATROVENT) 0.02 % nebulizer solution Take 500 mcg by nebulization once.    methylPREDNISolone sodium succinate (SOLU-MEDROL) 125 MG injection Inject 125 mg into the vein once.      Inpatient Medications:    . albuterol  2.5 mg Nebulization Q6H  . aspirin EC  325 mg Oral Daily  . azithromycin (ZITHROMAX) 500 MG IVPB  500 mg Intravenous Once  . azithromycin  500 mg Intravenous Q24H  . budesonide-formoterol  2 puff Inhalation BID  . carvedilol  3.125 mg Oral BID WC  . cefTRIAXone (ROCEPHIN)  IV  1 g Intravenous Once  . cefTRIAXone (ROCEPHIN)  IV  1 g Intravenous Q24H  . furosemide  40 mg Intravenous Once  . furosemide  40 mg Intravenous Daily  . insulin aspart  0-15 Units Subcutaneous Q4H  . insulin glargine  50 Units Subcutaneous BID  . methylPREDNISolone (SOLU-MEDROL) injection  60 mg Intravenous Q6H  . sodium chloride  3 mL Intravenous Q12H  . DISCONTD: enoxaparin  40 mg  Subcutaneous Q24H  . DISCONTD: insulin aspart  0-20 Units Subcutaneous TID WC  . DISCONTD: insulin aspart  0-5 Units Subcutaneous QHS  . DISCONTD: insulin aspart  6 Units Subcutaneous TID WC  . DISCONTD: insulin glargine  100 Units Subcutaneous BID  . DISCONTD: insulin glargine  100 Units Subcutaneous QHS    Allergies: No Known Allergies  History   Social History  . Marital Status: Married    Spouse Name: N/A    Number of Children: N/A  . Years of Education: N/A   Occupational History  . Not on file.   Social History Main Topics  . Smoking status: Former Smoker    Types: Cigarettes    Quit date: 10/01/2010  . Smokeless tobacco: Never Used  . Alcohol Use: No  . Drug Use: No  . Sexually Active: No   Other Topics Concern  . Not on file   Social History Narrative  . No narrative on file     Family History  Problem Relation Age of Onset  . Cancer Mother   . Heart failure Mother     Also has hx MVP s/p MVR  . Diabetes Mother   . Coronary artery disease Father     Died of MI at age 44     Review of Systems: General: negative for chills, fever, night sweats. + impressive weight gain of 33lbs  Cardiovascular: negative for chest pain. + edema, orthopnea. No palpitations. See above Dermatological: negative for rash Respiratory: Positive for some cough. No wheezing Urologic: negative for hematuria Abdominal: negative for nausea, vomiting, diarrhea, bright red blood per rectum, melena, or hematemesis Neurologic: negative for visual changes, syncope, or dizziness All other systems reviewed and are otherwise negative except as noted above.  Labs:  [facility] 09/01/11 1013 08/31/11 1712  CKTOTAL 339* --  CKMB 26.4* --  TROPONINI 1.44* <0.30   Lab Results  Component Value Date   WBC 15.4* 09/01/2011   HGB 12.4 09/01/2011   HCT 38.3 09/01/2011   MCV 89.1 09/01/2011   PLT 250 09/01/2011    Lab 09/01/11 0500 08/31/11 1737  NA 137 --  K 3.9 --  CL 101 --  CO2 29  --  BUN 15 --  CREATININE 0.80 --  CALCIUM 9.8 --  PROT -- 7.2  BILITOT -- 0.1*  ALKPHOS -- 194*  ALT -- 54*  AST -- 70*  GLUCOSE 232* --   No results found for this basename: CHOL, HDL, LDLCALC, TRIG   No results found for this basename: DDIMER    Radiology/Studies:  1. Chest 2 View 08/26/2011  *RADIOLOGY REPORT*  Clinical Data: Shortness of breath.  CHEST - 2 VIEW  Comparison: None.  Findings: There is cardiomegaly.  The patient has bilateral alveolar and interstitial opacities.  Airspace opacity is most confluent in the right middle lobe.  No pneumothorax or effusion is identified.  IMPRESSION:   Cardiomegaly and interstitial edema.  More confluent opacity in the right middle lobe is likely due to more focal edema. Pneumonia cannot be excluded.  Original Report Authenticated By: Bernadene Bell. D'ALESSIO, M.D.   2. Ct Angio Chest W/cm &/or Wo Cm 08/31/2011  *RADIOLOGY REPORT*  Clinical Data:  Shortness of breath.  Asthma.  Pneumonia.  CT ANGIOGRAPHY CHEST WITH CONTRAST  Technique:  Multidetector CT imaging of the chest was performed using the standard protocol during bolus administration of intravenous contrast.  Multiplanar CT image reconstructions including MIPs were obtained to evaluate the vascular anatomy.  Contrast: OMNIPAQUE IOHEXOL 300 MG/ML IV SOLN  Comparison:  None  Findings:  Technically adequate study with moderately good opacification of the central and proximal segmental pulmonary arteries.   No filling defects are demonstrated in the visualized vessels.  No evidence of significant pulmonary embolus. The sub segmental pulmonary arteries are not well visualized and smaller pulmonary emboli are not excluded. Normal caliber thoracic aorta with calcification.  Mild diffuse thickening of the esophageal wall which might represent reflux disease.  Prominent hilar and mediastinal lymph nodes.  Right paratracheal lymph nodes measure up to about 17 mm in diameter.  Left aortopulmonic window  lymph nodes measure about 14 mm in diameter.  These are nonspecific and could represent reactive or pathologic lymph nodes.  Recommend follow-up in 3-6 months for further evaluation.  Calcification of coronary arteries.  The lung fields demonstrate diffuse alveolar infiltrates bilaterally suggesting pulmonary edema or diffuse pneumonia. Nodular changes are not excluded.  No significant pleural effusion. No pneumothorax.  Degenerative changes in the thoracic spine without compression deformity or bone destruction.  Review of the MIP images confirms the above findings.  IMPRESSION: No evidence of significant central pulmonary embolus although the peripheral vessels are not well demonstrated and smaller peripheral emboli are not excluded.  Diffuse bilateral airspace disease throughout the lungs consistent with edema or pneumonia. Moderately enlarged mediastinal lymph nodes may represent reactive lymphadenopathy although pathologic lymph nodes are not excluded and follow-up is recommended.  Mild diffuse esophageal wall thickening suggesting inflammatory process.  Original Report Authenticated By: Marlon Pel, M.D.   EKG: Initial EKG 12/12 17:28pm showed sinus tach with marked ST depression V4-V6, ST depression avL. Difficult to assess for ST elevation given wandering baseline. F/u EKG today shows improvement of these findings with mild ST depression V5-V6 with general TW flattening V5, biphasic T in V6 with evidence for LVH.    Physical Exam: Blood pressure 146/55, pulse 97, temperature 98.7 F (37.1 C), resp. rate 22, SpO2 98.00%. General: Obese pleasant WF, well nourished, in no acute distress sitting comfortably in bed. Head: Normocephalic, atraumatic, sclera non-icteric, no xanthomas, nares are without discharge. Wears glasses Neck: Quiet right carotid bruits, L without bruit. JVD is slightly elevated. Lungs: Generally clear bilaterally to auscultation without wheezes, rales, or rhonchi. Breathing  is unlabored. Heart: RRR with S1 S2, +S3. No murmurs or gallops appreciated. Abdomen: Soft, non-tender, non-distended with normoactive bowel sounds. No hepatomegaly. No rebound/guarding. No obvious abdominal masses. Msk:  Strength and tone appear normal for age. Extremities: No clubbing or cyanosis. Soft sockline edema.  Distal pedal pulses are 1+ and equal bilaterally. Neuro: Alert and oriented X 3. Moves all extremities spontaneously. Psych:  Responds to questions appropriately with a normal affect.     Assessment and Plan:   1. Acute respiratory  distress - unclear precipitant, CHF vs. PNA as trigger. CXR with cardiomegaly, ?edema vs PNA on CT. BNP only 320 but can be falsely low in obese pts. Impressive 33lb weight gain since last week although on exam her volume is difficult to assess. Await 2D echocardiogram. Patient is symptomatically improved from breathing standpoint.  2. NSTEMI with marked ST depression yesterday - no prior hx of CAD. We have ordered heparin per pharmacy. Aspirin, Coreg started. Patient has several RF's for CAD and will likely need cath once felt compensated from respiratory standpoint. It is possible that enzymes could be from acute respiratory failure, but cannot exclude ischemia based on findings. Check lipid panel. Will discuss timing of cath with MD.  3. Diabetes mellitus - mgmt per primary team.  4. Right carotid bruit - would recommend carotid dopplers at d/c with f/u.  5. Lymphadenopathy on CT angio - will need eventual rescan.  Will discuss comprehensive plan with MD.  Signed, Ronie Spies PA-C 09/01/2011, 2:30 PM   Patient seen and examined, agree with above. 55 y/o F with 10 years of DM presents with acute dyspnea but no CP. EKG revealed sinus tachycardia, LVH, and marked ST depression laterally. Echocardiogram showed normal LV function and moderate LVH. Cardiac markers are abnormal, consistent with NSTEMI. Physical exam at present with clear lungs,  cardiovascular exam with RRR & extremities show trace edema. Patient will need cardiac catheterization. The risks & benefits including but not limited to death, myocardial infarction and CVA were discussed and pt agrees to proceed. Agree with therapy with aspirin, heparin, BB. Add statin. She will require an ACEI once renal function is stable post-procedure. Agree with gentle diuresis. Follow renal function closely. Outpatient carotid dopplers for R carotid bruit. She will need f/u CT for adenopathy. We will continue to cycle enzymes and will plan for cath in AM.  Olga Millers

## 2011-09-01 NOTE — H&P (Signed)
PCP: None  Chief Complaint: Progressive shortness of breath and wheezing.  HPI: Felicia Rivas is an 55 y.o. female with history of asthma, diabetes, diagnosed with community-acquired pneumonia, obesity, presents with acute worsening of her shortness of breath and wheezing. Apparently, she was a gas station and suddenly developed severe shortness of breath and wheezing. EMS was summoned, and she was placed on CPAP with immediate improvement of her breathing status.  In the emergency room, she was found to be hypoxemic and BiPAP was placed. She was evaluated and was found that her chest x-ray was suggestive of congestive heart failure versus pneumonia.  A pro BNP was 300's.  Her ABG did not show hypoxemia or acidosis. She was also given Lasix, and her breathing status improved markedly. Her blood glucose was also elevated in the mid 300s. Hospitalist was asked to admit her for treatment of her community-acquired pneumonia, congestive heart failure, hyperglycemia, and asthma exarcebation.  Rewiew of Systems:  The patient denies anorexia, fever, weight loss,, vision loss, decreased hearing, hoarseness, chest pain, syncope, , peripheral edema, balance deficits, hemoptysis, abdominal pain, melena, hematochezia, severe indigestion/heartburn, hematuria, incontinence, genital sores, muscle weakness, suspicious skin lesions, transient blindness, difficulty walking, depression, unusual weight change, abnormal bleeding, enlarged lymph nodes, angioedema, and breast masses.    Past Medical History  Diagnosis Date  . Asthma   . Diabetes mellitus     Past Surgical History  Procedure Date  . Cholecystectomy     Medications:  HOME MEDS: Prior to Admission medications   Medication Sig Start Date End Date Taking? Authorizing Provider  acetaminophen (TYLENOL) 500 MG tablet Take 1,000 mg by mouth every 6 (six) hours as needed. For pain     Yes Historical Provider, MD  albuterol (PROVENTIL) (2.5 MG/3ML) 0.083%  nebulizer solution Take 2.5 mg by nebulization 2 (two) times daily as needed. For shortness of breath and wheezing    Yes Historical Provider, MD  budesonide-formoterol (SYMBICORT) 160-4.5 MCG/ACT inhaler Inhale 2 puffs into the lungs 2 (two) times daily.    Yes Historical Provider, MD  insulin glargine (LANTUS) 100 UNIT/ML injection Inject 100 Units into the skin 2 (two) times daily.     Yes Historical Provider, MD  insulin lispro (HUMALOG) 100 UNIT/ML injection Inject 15 Units into the skin 3 (three) times daily before meals.     Yes Historical Provider, MD  moxifloxacin (AVELOX) 400 MG tablet Take 400 mg by mouth daily. Finished 08/31/11 after a 5 day course of therapy.    Yes Historical Provider, MD  albuterol (PROVENTIL) (5 MG/ML) 0.5% nebulizer solution Take 10 mg by nebulization once.      Historical Provider, MD  ipratropium (ATROVENT) 0.02 % nebulizer solution Take 500 mcg by nebulization once.      Historical Provider, MD  methylPREDNISolone sodium succinate (SOLU-MEDROL) 125 MG injection Inject 125 mg into the vein once.      Historical Provider, MD     Allergies:  No Known Allergies  Social History:   does not have a smoking history on file. She does not have any smokeless tobacco history on file. She reports that she does not drink alcohol or use illicit drugs.  Family History: Family History  Problem Relation Age of Onset  . Cancer Mother   . Heart failure Mother   . Diabetes Mother      Physical Exam: Filed Vitals:   08/31/11 2200 08/31/11 2300 08/31/11 2315 08/31/11 2340  BP: 134/59 146/57 129/66   Pulse: 66 74 77  Resp: 16 22 22    SpO2: 100% 100% 99% 100%   Blood pressure 129/66, pulse 77, resp. rate 22, SpO2 100.00%.  GEN:  Pleasant person lying in the stretcher in no acute distress; cooperative with exam PSYCH:  alert and oriented x4; does not appear anxious does not appear depressed; affect is normal HEENT: Mucous membranes pink and anicteric; PERRLA; EOM  intact; no cervical lymphadenopathy nor thyromegaly or carotid bruit; no JVD; Breasts:: Not examined CHEST WALL: No tenderness CHEST:  Diffuse wheezing.  She has shallow and rapid respirations. HEART: Regular rate and rhythm; no murmurs rubs or gallops BACK: No kyphosis or scoliosis; no CVA tenderness ABDOMEN: Obese, soft non-tender; no masses, no organomegaly, normal abdominal bowel sounds; no pannus; no intertriginous candida. Rectal Exam: Not done EXTREMITIES: No bone or joint deformity; age-appropriate arthropathy of the hands and knees; she has trace edema but no ulcerations. Genitalia: not examined PULSES: 2+ and symmetric SKIN: Normal hydration no rash or ulceration CNS: Cranial nerves 2-12 grossly intact no focal neurologic deficit   Labs & Imaging Results for orders placed during the hospital encounter of 08/31/11 (from the past 48 hour(s))  TROPONIN I     Status: Normal   Collection Time   08/31/11  5:12 PM      Component Value Range Comment   Troponin I <0.30  <0.30 (ng/mL)   CBC     Status: Abnormal   Collection Time   08/31/11  5:37 PM      Component Value Range Comment   WBC 10.7 (*) 4.0 - 10.5 (K/uL)    RBC 4.44  3.87 - 5.11 (MIL/uL)    Hemoglobin 12.6  12.0 - 15.0 (g/dL)    HCT 13.0  86.5 - 78.4 (%)    MCV 89.0  78.0 - 100.0 (fL)    MCH 28.4  26.0 - 34.0 (pg)    MCHC 31.9  30.0 - 36.0 (g/dL)    RDW 69.6  29.5 - 28.4 (%)    Platelets 235  150 - 400 (K/uL)   DIFFERENTIAL     Status: Abnormal   Collection Time   08/31/11  5:37 PM      Component Value Range Comment   Neutrophils Relative 75  43 - 77 (%)    Neutro Abs 8.0 (*) 1.7 - 7.7 (K/uL)    Lymphocytes Relative 19  12 - 46 (%)    Lymphs Abs 2.1  0.7 - 4.0 (K/uL)    Monocytes Relative 4  3 - 12 (%)    Monocytes Absolute 0.5  0.1 - 1.0 (K/uL)    Eosinophils Relative 2  0 - 5 (%)    Eosinophils Absolute 0.2  0.0 - 0.7 (K/uL)    Basophils Relative 1  0 - 1 (%)    Basophils Absolute 0.1  0.0 - 0.1 (K/uL)     COMPREHENSIVE METABOLIC PANEL     Status: Abnormal   Collection Time   08/31/11  5:37 PM      Component Value Range Comment   Sodium 136  135 - 145 (mEq/L)    Potassium 4.3  3.5 - 5.1 (mEq/L)    Chloride 102  96 - 112 (mEq/L)    CO2 21  19 - 32 (mEq/L)    Glucose, Bld 298 (*) 70 - 99 (mg/dL)    BUN 13  6 - 23 (mg/dL)    Creatinine, Ser 1.32  0.50 - 1.10 (mg/dL)    Calcium 9.0  8.4 - 10.5 (mg/dL)  Total Protein 7.2  6.0 - 8.3 (g/dL)    Albumin 3.4 (*) 3.5 - 5.2 (g/dL)    AST 70 (*) 0 - 37 (U/L)    ALT 54 (*) 0 - 35 (U/L)    Alkaline Phosphatase 194 (*) 39 - 117 (U/L)    Total Bilirubin 0.1 (*) 0.3 - 1.2 (mg/dL)    GFR calc non Af Amer >90  >90 (mL/min)    GFR calc Af Amer >90  >90 (mL/min)   PRO B NATRIURETIC PEPTIDE     Status: Abnormal   Collection Time   08/31/11  5:37 PM      Component Value Range Comment   Pro B Natriuretic peptide (BNP) 320.3 (*) 0 - 125 (pg/mL)   GLUCOSE, CAPILLARY     Status: Abnormal   Collection Time   08/31/11  5:39 PM      Component Value Range Comment   Glucose-Capillary 295 (*) 70 - 99 (mg/dL)   BLOOD GAS, ARTERIAL     Status: Abnormal   Collection Time   08/31/11  5:42 PM      Component Value Range Comment   FIO2 1.00      Delivery systems BILEVEL POSITIVE AIRWAY PRESSURE      Mode BILEVEL POSITIVE AIRWAY PRESSURE      Rate 10      Inspiratory PAP 18      Expiratory PAP 8      pH, Arterial 7.354  7.350 - 7.400     pCO2 arterial 39.7  35.0 - 45.0 (mmHg)    pO2, Arterial 271.0 (*) 80.0 - 100.0 (mmHg)    Bicarbonate 21.5  20.0 - 24.0 (mEq/L)    TCO2 19.3  0 - 100 (mmol/L)    Acid-base deficit 3.2 (*) 0.0 - 2.0 (mmol/L)    O2 Saturation 99.3      Patient temperature 98.6      Collection site RIGHT RADIAL      Drawn by [id number]      Sample type ARTERIAL DRAW      Allens test (pass/fail) PASS  PASS    POCT I-STAT TROPONIN I     Status: Abnormal   Collection Time   08/31/11  5:49 PM      Component Value Range Comment   Troponin i, poc  0.28 (*) 0.00 - 0.08 (ng/mL)    Comment NOTIFIED PHYSICIAN      Comment 3            POCT I-STAT, CHEM 8     Status: Abnormal   Collection Time   08/31/11  6:02 PM      Component Value Range Comment   Sodium 139  135 - 145 (mEq/L)    Potassium 4.4  3.5 - 5.1 (mEq/L)    Chloride 109  96 - 112 (mEq/L)    BUN 14  6 - 23 (mg/dL)    Creatinine, Ser 8.29  0.50 - 1.10 (mg/dL)    Glucose, Bld 562 (*) 70 - 99 (mg/dL)    Calcium, Ion 1.30  1.12 - 1.32 (mmol/L)    TCO2 22  0 - 100 (mmol/L)    Hemoglobin 15.0  12.0 - 15.0 (g/dL)    HCT 86.5  78.4 - 69.6 (%)   URINE RAPID DRUG SCREEN (HOSP PERFORMED)     Status: Abnormal   Collection Time   08/31/11  8:26 PM      Component Value Range Comment   Opiates NONE DETECTED  NONE DETECTED  Cocaine NONE DETECTED  NONE DETECTED     Benzodiazepines NONE DETECTED  NONE DETECTED     Amphetamines NONE DETECTED  NONE DETECTED     Tetrahydrocannabinol NONE DETECTED  NONE DETECTED     Barbiturates POSITIVE (*) NONE DETECTED    URINALYSIS, ROUTINE W REFLEX MICROSCOPIC     Status: Abnormal   Collection Time   08/31/11  8:26 PM      Component Value Range Comment   Color, Urine YELLOW  YELLOW     APPearance CLEAR  CLEAR     Specific Gravity, Urine 1.021  1.005 - 1.030     pH 6.0  5.0 - 8.0     Glucose, UA 250 (*) NEGATIVE (mg/dL)    Hgb urine dipstick NEGATIVE  NEGATIVE     Bilirubin Urine NEGATIVE  NEGATIVE     Ketones, ur NEGATIVE  NEGATIVE (mg/dL)    Protein, ur 253 (*) NEGATIVE (mg/dL)    Urobilinogen, UA 1.0  0.0 - 1.0 (mg/dL)    Nitrite NEGATIVE  NEGATIVE     Leukocytes, UA NEGATIVE  NEGATIVE    URINE MICROSCOPIC-ADD ON     Status: Abnormal   Collection Time   08/31/11  8:26 PM      Component Value Range Comment   Squamous Epithelial / LPF FEW (*) RARE     WBC, UA 0-2  <3 (WBC/hpf)    Casts HYALINE CASTS (*) NEGATIVE     Urine-Other MUCOUS PRESENT     GLUCOSE, CAPILLARY     Status: Abnormal   Collection Time   08/31/11 10:31 PM       Component Value Range Comment   Glucose-Capillary 225 (*) 70 - 99 (mg/dL)    Ct Angio Chest W/cm &/or Wo Cm  08/31/2011  *RADIOLOGY REPORT*  Clinical Data:  Shortness of breath.  Asthma.  Pneumonia.  CT ANGIOGRAPHY CHEST WITH CONTRAST  Technique:  Multidetector CT imaging of the chest was performed using the standard protocol during bolus administration of intravenous contrast.  Multiplanar CT image reconstructions including MIPs were obtained to evaluate the vascular anatomy.  Contrast: OMNIPAQUE IOHEXOL 300 MG/ML IV SOLN  Comparison:  None  Findings:  Technically adequate study with moderately good opacification of the central and proximal segmental pulmonary arteries.   No filling defects are demonstrated in the visualized vessels.  No evidence of significant pulmonary embolus. The sub segmental pulmonary arteries are not well visualized and smaller pulmonary emboli are not excluded. Normal caliber thoracic aorta with calcification.  Mild diffuse thickening of the esophageal wall which might represent reflux disease.  Prominent hilar and mediastinal lymph nodes.  Right paratracheal lymph nodes measure up to about 17 mm in diameter.  Left aortopulmonic window lymph nodes measure about 14 mm in diameter.  These are nonspecific and could represent reactive or pathologic lymph nodes.  Recommend follow-up in 3-6 months for further evaluation.  Calcification of coronary arteries.  The lung fields demonstrate diffuse alveolar infiltrates bilaterally suggesting pulmonary edema or diffuse pneumonia. Nodular changes are not excluded.  No significant pleural effusion. No pneumothorax.  Degenerative changes in the thoracic spine without compression deformity or bone destruction.  Review of the MIP images confirms the above findings.  IMPRESSION: No evidence of significant central pulmonary embolus although the peripheral vessels are not well demonstrated and smaller peripheral emboli are not excluded.  Diffuse  bilateral airspace disease throughout the lungs consistent with edema or pneumonia. Moderately enlarged mediastinal lymph nodes may represent reactive lymphadenopathy  although pathologic lymph nodes are not excluded and follow-up is recommended.  Mild diffuse esophageal wall thickening suggesting inflammatory process.  Original Report Authenticated By: Marlon Pel, M.D.   Dg Chest Port 1 View  08/31/2011  *RADIOLOGY REPORT*  Clinical Data: Short of breath.  Respiratory distress  PORTABLE CHEST - 1 VIEW  Comparison: 08/26/2011  Findings: Progression of diffuse bilateral airspace disease.  This may be due to pulmonary edema versus pneumonia.  No significant pleural effusion.  IMPRESSION: Progression of diffuse bilateral airspace disease, probable pulmonary edema.  Original Report Authenticated By: Camelia Phenes, M.D.      Assessment 1 community-acquired pneumonia 2 congestive heart failure 3 diabetes type 2  4 morbid obesity 5 asthmatic attack   PLAN:  Will admit her to the step down unit. She will be given supplemental O2. I will continue her on IV steroids along with Rocephin and Zithromax for community-acquired pneumonia. She will be diuresed slowly as well. We'll get an echo to evaluate her LV function. For her diabetes, we'll continue her large dose Lantus twice a day and use resistant insulin sliding scale. She is stable, full code, and will be admitted to triad hospitalist service.   Other plans as per orders.   Ashna Dorough 09/01/2011, 12:32 AM

## 2011-09-01 NOTE — Progress Notes (Signed)
.12:23 PM I agree with HPI/GPe and A/P per Dr. 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  Yesterday had a pretty unremarkable day at [employer] and was ready to go home and thought she felt a bit "Odd" and when she got down to the highway and parked open the side of the highway and wen tto the [place] station and called 9-1-1.  Had no CP at that time, has not had any since either. No arm pain, no and does have some paraesthesias in her arm.  Getting up to walk around and getting up and doing something at home last week would "wind" her.  Has gained 23 punds since last Firday.  Clothes not been tighter (wears loose clothing), in the evening her legs have been swollen.  Cannot lay down flat.  Saw a Development worker, international aid at [employer] 3 yr ago for Tricuspid regurgitation-had a stress test 3 years ago.  Today feels "much better than yesterday" Uses 2 Liters of O2 at night Sees [facility] that she sees for this.     CBC    Component Value Date/Time   WBC 15.4* 09/01/2011 0500   RBC 4.30 09/01/2011 0500   HGB 12.4 09/01/2011 0500   HCT 38.3 09/01/2011 0500   PLT 250 09/01/2011 0500   MCV 89.1 09/01/2011 0500   MCH 28.8 09/01/2011 0500   MCHC 32.4 09/01/2011 0500   RDW 14.2 09/01/2011 0500   LYMPHSABS 2.1 08/31/2011 1737   MONOABS 0.5 08/31/2011 1737   EOSABS 0.2 08/31/2011 1737   BASOSABS 0.1 08/31/2011 1737   BMET    Component Value Date/Time   NA 137 09/01/2011 0500   K 3.9 09/01/2011 0500   CL 101 09/01/2011 0500   CO2 29 09/01/2011 0500   GLUCOSE 232* 09/01/2011 0500   BUN 15 09/01/2011 0500   CREATININE 0.80 09/01/2011 0500   CALCIUM 9.8 09/01/2011 0500   GFRNONAA 81* 09/01/2011 0500   GFRAA >90  09/01/2011 0500   Results for Felicia, Felicia Rivas (MRN [id number]) as of 09/01/2011 12:01  Ref. Range 09/01/2011 10:13  CK, MB Latest Range: 0.3-4.0 ng/mL 26.4 (HH)  CK Total Latest Range: 7-177 U/L 339 (H)  Troponin I Latest Range: <0.30 ng/mL 1.44 (HH)    BP 146/55  Pulse 97  Temp 98.7 F (37.1 C)  Resp 22  SpO2 98% General appearance: alert and cooperative Throat: lips, mucosa, and tongue normal; teeth and gums normal Lungs: clear to auscultation bilaterally and normal percussion bilaterally Heart: regular rate and rhythm, S1, S2 normal, no murmur, click, rub or gallop Extremities: edema grade 1-2 and no dermal stasis changes Pulses: 2+ and symmetric   Patient Active Hospital Problem List: Possible (NSTEMI) (09/01/2011)   Assessment:Ce's elevated-1st set-Rpt x 3 q 6   Will consult Cards-Might need Heparinization + Cardiac cath.  Rpt EKG today 12:31 PM shows rversion of the St-T changes seen on 12.12 EKG, with some T wave flattening in V6, V1 only that is apparent now  PNA (pneumonia) (08/31/2011)   Assessment: Continue Empiric Rx-Would narrow abx and complete course of Rx (possibly Azithromycin 500 qd x 7 d) if no white count elevation in am  Asthma (08/31/2011)  Assessment: Continue rx   CHF (congestive heart failure) (08/31/2011)   Assessment: Will diurese gently as per prior plan-as doesn;t seem decompensated, would start empiric B-blocker low dose-Follow echo  DM2 (diabetes mellitus, type 2) (08/31/2011)   Assessment: CBG q6-keep NPO as unclear if needs intervention   Obesity (09/01/2011)   Assessment: Out-patient eval

## 2011-09-01 NOTE — Progress Notes (Signed)
  Echocardiogram 2D Echocardiogram has been performed.  [name] [facility] 09/01/2011, 2:01 PM

## 2011-09-02 ENCOUNTER — Encounter (HOSPITAL_COMMUNITY): Payer: Self-pay | Admitting: Cardiology

## 2011-09-02 ENCOUNTER — Encounter (HOSPITAL_COMMUNITY)
Admission: EM | Disposition: A | Discharge status: Home / Self Care | Payer: Self-pay | Source: Ambulatory Visit | Attending: Surgery

## 2011-09-02 ENCOUNTER — Other Ambulatory Visit: Payer: Self-pay | Admitting: Surgery

## 2011-09-02 DIAGNOSIS — I2510 Atherosclerotic heart disease of native coronary artery without angina pectoris: Secondary | ICD-10-CM

## 2011-09-02 HISTORY — PX: LEFT HEART CATHETERIZATION WITH CORONARY ANGIOGRAM: SHX5451

## 2011-09-02 LAB — CBC
Platelets: 254 10*3/uL (ref 150–400)
RBC: 4.58 MIL/uL (ref 3.87–5.11)
WBC: 15.1 10*3/uL — ABNORMAL HIGH (ref 4.0–10.5)

## 2011-09-02 LAB — COMPREHENSIVE METABOLIC PANEL
ALT: 34 U/L (ref 0–35)
AST: 20 U/L (ref 0–37)
Alkaline Phosphatase: 142 U/L — ABNORMAL HIGH (ref 39–117)
CO2: 26 mEq/L (ref 19–32)
Chloride: 100 mEq/L (ref 96–112)
GFR calc non Af Amer: >90 mL/min (ref >90)
Sodium: 135 mEq/L (ref 135–145)
Total Bilirubin: 0.2 mg/dL — ABNORMAL LOW (ref 0.3–1.2)

## 2011-09-02 LAB — GLUCOSE, CAPILLARY
Glucose-Capillary: 233 mg/dL — ABNORMAL HIGH (ref 70–99)
Glucose-Capillary: 258 mg/dL — ABNORMAL HIGH (ref 70–99)
Glucose-Capillary: 281 mg/dL — ABNORMAL HIGH (ref 70–99)

## 2011-09-02 LAB — CARDIAC PANEL(CRET KIN+CKTOT+MB+TROPI)
Total CK: 133 U/L (ref 7–177)
Troponin I: 2.18 ng/mL (ref <0.30)

## 2011-09-02 LAB — URINE CULTURE
Colony Count: NO GROWTH
Culture  Setup Time: 201212130110
Culture: NO GROWTH

## 2011-09-02 LAB — HEPARIN LEVEL (UNFRACTIONATED): Heparin Unfractionated: 0.37 IU/mL (ref 0.30–0.70)

## 2011-09-02 LAB — SURGICAL PCR SCREEN
MRSA, PCR: NEGATIVE
Staphylococcus aureus: NEGATIVE

## 2011-09-02 SURGERY — LEFT HEART CATHETERIZATION WITH CORONARY ANGIOGRAM
Anesthesia: LOCAL

## 2011-09-02 MED ORDER — VERAPAMIL HCL 2.5 MG/ML IV SOLN
INTRAVENOUS | Status: AC
  Filled 2011-09-02: qty 2

## 2011-09-02 MED ORDER — DIAZEPAM 2 MG PO TABS: 2.0000 mg | ORAL_TABLET | ORAL | Status: DC | PRN

## 2011-09-02 MED ORDER — INSULIN ASPART 100 UNIT/ML ~~LOC~~ SOLN
0.0000 [IU] | Freq: Three times a day (TID) | SUBCUTANEOUS | Status: DC
Start: 2011-09-02 — End: 2011-09-03
  Administered 2011-09-02 (×2): 5 [IU] via SUBCUTANEOUS
  Administered 2011-09-03: 3 [IU] via SUBCUTANEOUS
  Filled 2011-09-02: qty 3

## 2011-09-02 MED ORDER — HEPARIN SOD (PORCINE) IN D5W 100 UNIT/ML IV SOLN
2400.0000 [IU]/h | INTRAVENOUS | Status: DC
  Administered 2011-09-02: 1650 [IU]/h via INTRAVENOUS
  Administered 2011-09-03 (×2): 1800 [IU]/h via INTRAVENOUS
  Administered 2011-09-04 – 2011-09-05 (×3): 2200 [IU]/h via INTRAVENOUS
  Administered 2011-09-06 – 2011-09-07 (×2): 2400 [IU]/h via INTRAVENOUS
  Filled 2011-09-02 (×11): qty 250

## 2011-09-02 MED ORDER — LIDOCAINE HCL (PF) 1 % IJ SOLN
INTRAMUSCULAR | Status: AC
  Filled 2011-09-02: qty 30

## 2011-09-02 MED ORDER — ASPIRIN 81 MG PO CHEW
81.0000 mg | CHEWABLE_TABLET | Freq: Every day | ORAL | Status: DC
  Administered 2011-09-02 – 2011-09-06 (×5): 81 mg via ORAL
  Filled 2011-09-02 (×5): qty 1

## 2011-09-02 MED ORDER — INSULIN ASPART 100 UNIT/ML ~~LOC~~ SOLN: 0.0000 [IU] | Freq: Three times a day (TID) | SUBCUTANEOUS | Status: DC

## 2011-09-02 MED ORDER — HEPARIN (PORCINE) IN NACL 2-0.9 UNIT/ML-% IJ SOLN
INTRAMUSCULAR | Status: AC
  Filled 2011-09-02: qty 2000

## 2011-09-02 MED ORDER — NITROGLYCERIN IN D5W 200-5 MCG/ML-% IV SOLN
3.0000 ug/min | INTRAVENOUS | Status: DC
  Administered 2011-09-02: 3 ug/min via INTRAVENOUS
  Filled 2011-09-02 (×2): qty 250

## 2011-09-02 MED ORDER — SODIUM CHLORIDE 0.9 % IV SOLN: INTRAVENOUS | Status: AC

## 2011-09-02 MED ORDER — NITROGLYCERIN IN D5W 200-5 MCG/ML-% IV SOLN
INTRAVENOUS | Status: AC
  Filled 2011-09-02: qty 250

## 2011-09-02 MED ORDER — MIDAZOLAM HCL 2 MG/2ML IJ SOLN
INTRAMUSCULAR | Status: AC
  Filled 2011-09-02: qty 2

## 2011-09-02 MED ORDER — AMLODIPINE BESYLATE 5 MG PO TABS
5.0000 mg | ORAL_TABLET | Freq: Every day | ORAL | Status: DC
  Administered 2011-09-02 – 2011-09-06 (×5): 5 mg via ORAL
  Filled 2011-09-02 (×6): qty 1

## 2011-09-02 MED ORDER — HEPARIN SODIUM (PORCINE) 1000 UNIT/ML IJ SOLN
INTRAMUSCULAR | Status: AC
  Filled 2011-09-02: qty 1

## 2011-09-02 MED ORDER — FUROSEMIDE 10 MG/ML IJ SOLN
INTRAMUSCULAR | Status: AC
  Filled 2011-09-02: qty 4

## 2011-09-02 MED ORDER — FUROSEMIDE 40 MG PO TABS
40.0000 mg | ORAL_TABLET | Freq: Every day | ORAL | Status: DC
  Administered 2011-09-03: 40 mg via ORAL
  Filled 2011-09-02: qty 1

## 2011-09-02 MED ORDER — NITROGLYCERIN 0.2 MG/ML ON CALL CATH LAB
INTRAVENOUS | Status: AC
  Filled 2011-09-02: qty 1

## 2011-09-02 MED ORDER — ONDANSETRON HCL 4 MG/2ML IJ SOLN: 4.0000 mg | Freq: Four times a day (QID) | INTRAMUSCULAR | Status: DC | PRN

## 2011-09-02 NOTE — Progress Notes (Signed)
Patient ID: Felicia Rivas, female   DOB: 1956-02-23, 55 y.o.   MRN: [id number] Patient stable post cath.  No def rales.  Slight wheezes.   Antibiotics were placed on hold for now.  She has not had a temp, and wbc is high, but getting steroids, which I also held.  Will have general medicine comment on this.  Note:  EDP was greater than 40.  Shawnie Pons 5:38 PM 09/02/2011

## 2011-09-02 NOTE — Op Note (Signed)
Cardiac Catheterization Procedure Note  Name: Felicia Rivas MRN: [id number] DOB: October 11, 1955  Procedure: Left Heart Cath, Selective Coronary Angiography,  Indication: non stemi   Procedural Details: The right wrist was prepped, draped, and anesthetized with 1% lidocaine. Using the modified Seldinger technique, a 5 French sheath was introduced into the right radial artery. 3 mg of verapamil was administered through the sheath, weight-based unfractionated heparin was administered intravenously. Standard Judkins catheters were used for selective coronary angiography and left ventriculography. Catheter exchanges were performed over an exchange length guidewire. There were no immediate procedural complications. A TR band was used for radial hemostasis at the completion of the procedure.  The patient was transferred to the post catheterization recovery area for further monitoring.  Procedural Findings: Hemodynamics: AO 150/79 (106) LV 164/48  Coronary angiography: Coronary dominance: right  Left mainstem: The left main coronary stem is calcified and short but without significant obstruction.  Left anterior descending (LAD): The left anterior descending artery is fairly heavily calcified throughout its course. There is segmental plaque throughout the proximal vessel but not high-grade significant obstruction. There is approximate 70-80% stenosis just beyond the origin of the major diagonal near a septal perforator. It does not appear to be the culprit vessel.  Left circumflex (LCx): The circumflex coronary artery provides a first marginal branch has mild ostial irregularity but no high-grade stenosis. The AV circumflex and provides a second marginal branch which has a high-grade stenosis of approximately 80% the distal portion of this large OM 2 bifurcates and is a large caliber vessel the AV circumflex supplies a small posterolateral segment and has about 60-70% narrowing. Small posterolateral segment  is nongraftable. The large OM 2 is a graftable vessel.  Right coronary artery (RCA): The right coronary artery demonstrates about 40% segmental plaque near the junction of the proximal and mid vessel. It then opens up and then there is 30% narrowing at the acute margin distally, there is about an 80% area stenosis prior to the takeoff of the posterior descending branch which is large and graftable and the somewhat smaller posterolateral branch.   Final Conclusions:   1. 3 vessel coronary artery disease with significant involvement of the right coronary artery and moderately high-grade involvement of the obtuse marginal and left anterior descending artery. 2. Pulmonary edema with marked elevation in left ventricular end-diastolic pressure likely accounting for findings on CT.  Recommendations:  1. I will review with my colleagues. We will attempt to diurese her. Final recommendations pending.  Shawnie Pons 11:35 AM 09/02/2011   Shawnie Pons 09/02/2011, 11:27 AM

## 2011-09-02 NOTE — Progress Notes (Addendum)
1610 Received to cath lab holding area pre-procedure via [facility] from [facility]. Vital signs recorded in doc flowsheets. NSL right AC; 0.9% NS @ 75cc/hr right hand. Both sites unremarable. [id number] Dr. Riley Kill in to speak with patient. Taken to cath lab procedure room.

## 2011-09-02 NOTE — Progress Notes (Signed)
[facility] HOSPITALIST STEP-DOWN PROGRESS NOTE   Patient Details:    Felicia Rivas is an 55 y.o. female who presented to [facility] with possible PNA in a setting of recently Rx PNA She presented with SOB and Wheeze and was hypoxemic and placed on BI-PAP with some improvement She had no assosc CP or Diaphoresis, but initial EKG showed ST-T wave changes with elevations of Cardiac enzymes. Cardiology was consulted and she was taken to [facility] for Cardiac cath 12.14 am  Lines, Airways, Drains: Urethral Catheter (Active)  Site Assessment Clean;Intact 09/02/2011  7:30 AM  Collection Container Standard drainage bag 09/02/2011  7:30 AM  Securement Method Securing device (Describe) 09/02/2011  7:30 AM  Urinary Catheter Interventions Unclamped 09/02/2011  7:30 AM  Output (mL) 60 mL 09/02/2011  7:30 AM    Anti-infectives:  Anti-infectives     Start     Dose/Rate Route Frequency Ordered Stop   09/01/11 0224   cefTRIAXone (ROCEPHIN) 1 g in dextrose 5 % 50 mL IVPB        1 g 100 mL/hr over 30 Minutes Intravenous Every 24 hours 09/01/11 0224     09/01/11 0224   azithromycin (ZITHROMAX) 500 mg in dextrose 5 % 250 mL IVPB        500 mg 250 mL/hr over 60 Minutes Intravenous Every 24 hours 09/01/11 0224     08/31/11 2045   cefTRIAXone (ROCEPHIN) 1 g in dextrose 5 % 50 mL IVPB        1 g 100 mL/hr over 30 Minutes Intravenous  Once 08/31/11 2038 08/31/11 2152   08/31/11 2045   azithromycin (ZITHROMAX) 500 mg in dextrose 5 % 250 mL IVPB        500 mg 250 mL/hr over 60 Minutes Intravenous  Once 08/31/11 2038 08/31/11 2338          Microbiology: Results for orders placed during the hospital encounter of 08/31/11  CULTURE, BLOOD (ROUTINE X 2)     Status: Normal (Preliminary result)   Collection Time   08/31/11  5:40 PM      Component Value Range Status Comment   Specimen Description BLOOD LEFT HAND   Final    Special Requests BOTTLES DRAWN AEROBIC AND ANAEROBIC 5CC   Final    Setup Time 201212122310    Final    Culture     Final    Value:        BLOOD CULTURE RECEIVED NO GROWTH TO DATE CULTURE WILL BE HELD FOR 5 DAYS BEFORE ISSUING A FINAL NEGATIVE REPORT   Report Status PENDING   Incomplete   CULTURE, BLOOD (ROUTINE X 2)     Status: Normal (Preliminary result)   Collection Time   08/31/11  5:42 PM      Component Value Range Status Comment   Specimen Description BLOOD RIGHT ARM   Final    Special Requests BOTTLES DRAWN AEROBIC AND ANAEROBIC 3CC   Final    Setup Time [id number]   Final    Culture     Final    Value:        BLOOD CULTURE RECEIVED NO GROWTH TO DATE CULTURE WILL BE HELD FOR 5 DAYS BEFORE ISSUING A FINAL NEGATIVE REPORT   Report Status PENDING   Incomplete   URINE CULTURE     Status: Normal   Collection Time   08/31/11  8:26 PM      Component Value Range Status Comment   Specimen Description URINE, CATHETERIZED     Final    Special Requests NONE   Final    Setup Time [id number]   Final    Colony Count NO GROWTH   Final    Culture NO GROWTH   Final    Report Status 09/02/2011 FINAL   Final   MRSA PCR SCREENING     Status: Normal   Collection Time   09/01/11 10:39 PM      Component Value Range Status Comment   MRSA by PCR NEGATIVE  NEGATIVE  Final     ABG    Component Value Date/Time   PHART 7.354 08/31/2011 1742   PCO2ART 39.7 08/31/2011 1742   PO2ART 271.0* 08/31/2011 1742   HCO3 21.5 08/31/2011 1742   TCO2 22 08/31/2011 1802   ACIDBASEDEF 3.2* 08/31/2011 1742   O2SAT 99.3 08/31/2011 1742     Best Practice/Protocols:  VTE Prophylaxis: Heparin (drip) Vancomycin/Azithromycin (12.12>>>)  Events: CT chest neg for PE 12.12 Cardiac Cath planned 12.14   Studies:  Pertinent labs and imaging all reviewed today  Consults: Treatment Team:  Peter Le Dalton McLean, MD   Subjective:    Overnight Issues:   Objective:  Vital signs for last 24 hours: Temp:  [98 F (36.7 C)-98.7 F (37.1 C)] 98.4 F (36.9 C) (12/14 0500) Pulse Rate:  [75-101] 75   (12/14 0730) Resp:  [16-24] 16  (12/14 0730) BP: (109-156)/(55-100) 156/73 mmHg (12/14 0730) SpO2:  [97 %-100 %] 99 % (12/14 0730) Weight:  [141.4 kg (311 lb 11.7 oz)-142.8 kg (314 lb 13.1 oz)] 311 lb 11.7 oz (141.4 kg) (12/14 0500)  Intake/Output from previous day: 12/13 0701 - 12/14 0700 In: 671.2 [P.O.:120; I.V.:251.2; IV Piggyback:300] Out: 2500 [Urine:2500]  Intake/Output this shift: Total I/O In: -  Out: 60 [Urine:60]  Physical Exam:  General: alert and no respiratory distress Neuro: alert, oriented and nonfocal exam HEENT/Neck: no JVD and Possible Bruit L carotid Resp: clear to auscultation bilaterally and normal percussion bilaterally CVS: regular rate and rhythm, S1, S2 normal, no murmur, click, rub or gallop GI: soft, nontender, BS WNL, no r/g Skin: no rash  Assessment/Plan:   NEURO  NAD   Plan: nad  PULM  Pneumonia: community-acquired (possible-unlikley)   Plan: D#2 abx-would wean to oral Azithro 500 x 3 more days in am-Leukocytosis likely 2/2 to NSTEMI>>PNA CXR tomorrow to confirm  CARDIO  Acute Coronary Syndrome: acute myocardial infarction (no ST elevation)   Plan: For Cath-on heparin Further dispo per Cards-Appreciate consult  RENAL  NAD   Plan: nad  GI  Nad   Plan: Nad  ID  Pneumonia (see above)   Plan: see above  HEME  Leukocytosis (lymphocytosis)   Plan: likely 2/2 to NSTEMI>> PNA-see above  ENDO Diabetes Mellitus (Type 2)   Plan: Continue SSI-NPO still Blood sugars 200 range  Global Issues   Patient transferred to [facility] for further management and care under cardiology [facility] If patient stays at [facility]-please inform this MD so patient can be followed there-thanks   LOS: 2 days   Additional comments:I reviewed the patient's new clinical lab test results. in entirety and I reviewed the patient's other test results. in entirety  Critical Care Total Time*: 45 Minutes  Lisbet Busker,JAI 09/02/2011     

## 2011-09-02 NOTE — Op Note (Signed)
Cardiac Catheterization Procedure Note  Name: Felicia Rivas MRN: [id number] DOB: [birth year]  Procedure: Left Heart Cath, Selective Coronary Angiography,  Indication: non stemi   Procedural Details: The right wrist was prepped, draped, and anesthetized with 1% lidocaine. Using the modified Seldinger technique, a 5 French sheath was introduced into the right radial artery. 3 mg of verapamil was administered through the sheath, weight-based unfractionated heparin was administered intravenously. Standard Judkins catheters were used for selective coronary angiography and left ventriculography. Catheter exchanges were performed over an exchange length guidewire. There were no immediate procedural complications. A TR band was used for radial hemostasis at the completion of the procedure.  The patient was transferred to the post catheterization recovery area for further monitoring.  Procedural Findings: Hemodynamics: AO 150/79 (106) LV 164/48  Coronary angiography: Coronary dominance: right  Left mainstem: The left main coronary stem is calcified and short but without significant obstruction.  Left anterior descending (LAD): The left anterior descending artery is fairly heavily calcified throughout its course. There is segmental plaque throughout the proximal vessel but not high-grade significant obstruction. There is approximate 70-80% stenosis just beyond the origin of the major diagonal near a septal perforator. It does not appear to be the culprit vessel.  Left circumflex (LCx): The circumflex coronary artery provides a first marginal branch has mild ostial irregularity but no high-grade stenosis. The AV circumflex and provides a second marginal branch which has a high-grade stenosis of approximately 80% the distal portion of this large OM 2 bifurcates and is a large caliber vessel the AV circumflex supplies a small posterolateral segment and has about 60-70% narrowing. Small posterolateral segment  is nongraftable. The large OM 2 is a graftable vessel.  Right coronary artery (RCA): The right coronary artery demonstrates about 40% segmental plaque near the junction of the proximal and mid vessel. It then opens up and then there is 30% narrowing at the acute margin distally, there is about an 80% area stenosis prior to the takeoff of the posterior descending branch which is large and graftable and the somewhat smaller posterolateral branch.   Final Conclusions:   1. 3 vessel coronary artery disease with significant involvement of the right coronary artery and moderately high-grade involvement of the obtuse marginal and left anterior descending artery. 2. Pulmonary edema with marked elevation in left ventricular end-diastolic pressure likely accounting for findings on CT.  Recommendations:  1. I will review with my colleagues. We will attempt to diurese her. Final recommendations pending.  Shawnie Pons 11:35 AM 09/02/2011   Shawnie Pons 09/02/2011, 11:35 AM

## 2011-09-02 NOTE — Progress Notes (Signed)
ANTICOAGULATION CONSULT NOTE - Initial Consult  Pharmacy Consult for Heparin Indication: ACS/NSTEMI  No Known Allergies  Patient Measurements: Height: 5\' 7"  (170.2 cm) Weight: 311 lb 11.7 oz (141.4 kg) IBW/kg (Calculated) : 61.6  Adjusted Body Weight: 96.3  Vital Signs: Temp: 97.9 F (36.6 C) (12/14 1613) BP: 148/38 mmHg (12/14 1615) Pulse Rate: 72  (12/14 1615)  Labs:  Basename 09/02/11 0625 09/02/11 0600 09/02/11 0147 09/01/11 2225 09/01/11 1630 09/01/11 1503 09/01/11 1013 09/01/11 0500 08/31/11 1802 08/31/11 1737  HGB 13.2 -- -- -- -- -- -- 12.4 -- --  HCT 40.3 -- -- -- -- -- -- 38.3 44.0 --  PLT 254 -- -- -- -- -- -- 250 -- 235  APTT -- -- -- -- -- 29 -- -- -- --  LABPROT -- -- -- -- -- 14.0 -- -- -- --  INR -- -- -- -- -- 1.06 -- -- -- --  HEPARINUNFRC -- 0.37 -- <0.10* -- -- -- -- -- --  CREATININE 0.61 -- -- -- -- -- -- 0.80 0.80 --  CKTOTAL -- -- 133 -- 234* -- 339* -- -- --  CKMB -- -- 9.7* -- 18.0* -- 26.4* -- -- --  TROPONINI -- -- 2.18* -- 2.71* -- 1.44* -- -- --   Estimated Creatinine Clearance: 115.9 ml/min (by C-G formula based on Cr of 0.61).  Medical History: Past Medical History  Diagnosis Date  . Asthma   . Diabetes mellitus   . PONV (postoperative nausea and vomiting)   . Sleep disturbance     Had sleep study several years ago, wasn't told she has OSA but told she needs O2 at night  . Abnormal stress test     Failed stress test 3 years ago in [facility] for cough -  had f/u echo showing a "valve didn't open well" . No cath  . Myocardial infarction     Assessment: 83 YOF with NSTEMI, s/p cath. Pt. was on heparin before cath at [facility], level therapeutic at 1650units/hr. Now to restart heparin infusion with a goal range of 0.3-0.5 per Dr. Riley Kill. Pt. may need cardiac surgery.   Goal of Therapy:  0.3-0.5 units/ml   Plan:  Resume heparin infusion 1650 units/hr start 2000 tonight,  F/u heparin level 6 hours after infusion start at around 0200 on  12/15  Riki Rusk 09/02/2011,4:53 PM

## 2011-09-02 NOTE — Interval H&P Note (Signed)
History and Physical Interval Note:  09/02/2011 10:17 AM  Felicia Rivas  has presented today for surgery, with the diagnosis of non STEMI.  See note of Dr. Jens Som.  All studies reviewed.  She has had multiple studies.  CT scan with findings noted.  With positive enzymes, we will proceed with cath today.  Timing of PCI if needed is in question in part due to pulmonary findings.    The various methods of treatment have been discussed with the patient. After consideration of risks, benefits and other options for treatment, the patient has consented to  Procedure(s): LEFT HEART CATHETERIZATION WITH CORONARY ANGIOGRAM as a surgical intervention .  The patients' history has been reviewed, patient examined, no change in status, stable for surgery.  I have reviewed the patients' chart and labs.  Questions were answered to the patient's satisfaction.     Shawnie Pons

## 2011-09-02 NOTE — Progress Notes (Addendum)
This is a progress note on [name] done at [facility] at 7 PM. This patient was earlier seen at [facility] by [facility] hospitalist subsequently patient was transferred here under team 8 at 10am this morning. I was asked to see the patient by Cardiology as she has not been seen by hospitalist today at this facility.   Felicia Rivas ZOX:[id number] is a 55 y.o. female,  Outpatient Primary MD for the patient is No primary provider on file.  Chief Complaint  Patient presents with  . Respiratory Distress        Subjective:   Felicia Rivas today has, No headache, No chest pain, No abdominal pain - No Nausea, No new weakness tingling or numbness, No Cough - SOB.    Objective:   Filed Vitals:   09/02/11 1615 09/02/11 1630 09/02/11 1718 09/02/11 1730  BP: 148/38 143/55 143/55 125/113  Pulse: 72 77 71   Temp:      TempSrc:      Resp:      Height:      Weight:      SpO2: 100% 100%      Wt Readings from Last 3 Encounters:  09/02/11 141.4 kg (311 lb 11.7 oz)  09/02/11 141.4 kg (311 lb 11.7 oz)     Intake/Output Summary (Last 24 hours) at 09/02/11 1850 Last data filed at 09/02/11 1613  Gross per 24 hour  Intake 716.18 ml  Output   1320 ml  Net -603.82 ml    Exam Awake Alert, Oriented *3, No new F.N deficits, Normal affect Felicia Rivas.AT,PERRAL Supple Neck,No JVD, No cervical lymphadenopathy appriciated.  Symmetrical Chest wall movement, Good air movement bilaterally, few rales RRR,No Gallops,Rubs or new Murmurs, No Parasternal Heave +ve B.Sounds, Abd Soft, Non tender, No organomegaly appriciated, No rebound -guarding or rigidity. No Cyanosis, Clubbing, trace edema, No new Rash or bruise     Data Review  CBC  Lab 09/02/11 0625 09/01/11 0500 08/31/11 1802 08/31/11 1737  WBC 15.1* 15.4* -- 10.7*  HGB 13.2 12.4 15.0 12.6  HCT 40.3 38.3 44.0 39.5  PLT 254 250 -- 235  MCV 88.0 89.1 -- 89.0  MCH 28.8 28.8 -- 28.4  MCHC 32.8 32.4 -- 31.9  RDW 13.7 14.2 --  13.8  LYMPHSABS -- -- -- 2.1  MONOABS -- -- -- 0.5  EOSABS -- -- -- 0.2  BASOSABS -- -- -- 0.1  BANDABS -- -- -- --    Chemistries   Lab 09/02/11 0625 09/01/11 0500 08/31/11 1802 08/31/11 1737  NA 135 137 139 136  K 4.0 3.9 4.4 4.3  CL 100 101 109 102  CO2 26 29 -- 21  GLUCOSE 286* 232* 306* 298*  BUN 18 15 14 13   CREATININE 0.61 0.80 0.80 0.75  CALCIUM 9.7 9.8 -- 9.0  MG -- -- -- --  AST 20 -- -- 70*  ALT 34 -- -- 54*  ALKPHOS 142* -- -- 194*  BILITOT 0.2* -- -- 0.1*   ------------------------------------------------------------------------------------------------------------------ estimated creatinine clearance is 115.9 ml/min (by C-G formula based on Cr of 0.61). ------------------------------------------------------------------------------------------------------------------  [facility] 09/01/11 0447  HGBA1C 8.1*   ------------------------------------------------------------------------------------------------------------------ No results found for this basename: CHOL:2,HDL:2,LDLCALC:2,TRIG:2,CHOLHDL:2,LDLDIRECT:2 in the last 72 hours ------------------------------------------------------------------------------------------------------------------  [facility] 09/01/11 0447  TSH 1.017  T4TOTAL --  T3FREE --  THYROIDAB --   ------------------------------------------------------------------------------------------------------------------ No results found for this basename: VITAMINB12:2,FOLATE:2,FERRITIN:2,TIBC:2,IRON:2,RETICCTPCT:2 in the last 72 hours  Coagulation profile  Lab 09/01/11 1503  INR 1.06  PROTIME --  No results found for this basename: DDIMER:2 in the last 72 hours  Cardiac Enzymes  Lab 09/02/11 0147 09/01/11 1630 09/01/11 1013  CKMB 9.7* 18.0* 26.4*  TROPONINI 2.18* 2.71* 1.44*  MYOGLOBIN -- -- --   ------------------------------------------------------------------------------------------------------------------ No components found with this  basename: POCBNP:3  Micro Results Recent Results (from the past 240 hour(s))  CULTURE, BLOOD (ROUTINE X 2)     Status: Normal (Preliminary result)   Collection Time   08/31/11  5:40 PM      Component Value Range Status Comment   Specimen Description BLOOD LEFT HAND   Final    Special Requests BOTTLES DRAWN AEROBIC AND ANAEROBIC 5CC   Final    Setup Time [id number]   Final    Culture     Final    Value:        BLOOD CULTURE RECEIVED NO GROWTH TO DATE CULTURE WILL BE HELD FOR 5 DAYS BEFORE ISSUING A FINAL NEGATIVE REPORT   Report Status PENDING   Incomplete   CULTURE, BLOOD (ROUTINE X 2)     Status: Normal (Preliminary result)   Collection Time   08/31/11  5:42 PM      Component Value Range Status Comment   Specimen Description BLOOD RIGHT ARM   Final    Special Requests BOTTLES DRAWN AEROBIC AND ANAEROBIC 3CC   Final    Setup Time [id number]   Final    Culture     Final    Value:        BLOOD CULTURE RECEIVED NO GROWTH TO DATE CULTURE WILL BE HELD FOR 5 DAYS BEFORE ISSUING A FINAL NEGATIVE REPORT   Report Status PENDING   Incomplete   URINE CULTURE     Status: Normal   Collection Time   08/31/11  8:26 PM      Component Value Range Status Comment   Specimen Description URINE, CATHETERIZED   Final    Special Requests NONE   Final    Setup Time [id number]   Final    Colony Count NO GROWTH   Final    Culture NO GROWTH   Final    Report Status 09/02/2011 FINAL   Final   MRSA PCR SCREENING     Status: Normal   Collection Time   09/01/11 10:39 PM      Component Value Range Status Comment   MRSA by PCR NEGATIVE  NEGATIVE  Final     Radiology Reports Dg Chest 2 View  08/26/2011  *RADIOLOGY REPORT*  Clinical Data: Shortness of breath.  CHEST - 2 VIEW  Comparison: None.  Findings: There is cardiomegaly.  The patient has bilateral alveolar and interstitial opacities.  Airspace opacity is most confluent in the right middle lobe.  No pneumothorax or effusion is identified.   IMPRESSION:   Cardiomegaly and interstitial edema.  More confluent opacity in the right middle lobe is likely due to more focal edema. Pneumonia cannot be excluded.  Original Report Authenticated By: Bernadene Bell. Maricela Curet, M.D.   Ct Angio Chest W/cm &/or Wo Cm  08/31/2011  *RADIOLOGY REPORT*  Clinical Data:  Shortness of breath.  Asthma.  Pneumonia.  CT ANGIOGRAPHY CHEST WITH CONTRAST  Technique:  Multidetector CT imaging of the chest was performed using the standard protocol during bolus administration of intravenous contrast.  Multiplanar CT image reconstructions including MIPs were obtained to evaluate the vascular anatomy.  Contrast: OMNIPAQUE IOHEXOL 300 MG/ML IV SOLN  Comparison:  None  Findings:  Technically adequate study  with moderately good opacification of the central and proximal segmental pulmonary arteries.   No filling defects are demonstrated in the visualized vessels.  No evidence of significant pulmonary embolus. The sub segmental pulmonary arteries are not well visualized and smaller pulmonary emboli are not excluded. Normal caliber thoracic aorta with calcification.  Mild diffuse thickening of the esophageal wall which might represent reflux disease.  Prominent hilar and mediastinal lymph nodes.  Right paratracheal lymph nodes measure up to about 17 mm in diameter.  Left aortopulmonic window lymph nodes measure about 14 mm in diameter.  These are nonspecific and could represent reactive or pathologic lymph nodes.  Recommend follow-up in 3-6 months for further evaluation.  Calcification of coronary arteries.  The lung fields demonstrate diffuse alveolar infiltrates bilaterally suggesting pulmonary edema or diffuse pneumonia. Nodular changes are not excluded.  No significant pleural effusion. No pneumothorax.  Degenerative changes in the thoracic spine without compression deformity or bone destruction.  Review of the MIP images confirms the above findings.    IMPRESSION: No evidence of  significant central pulmonary embolus although the peripheral vessels are not well demonstrated and smaller peripheral emboli are not excluded.  Diffuse bilateral airspace disease throughout the lungs consistent with edema or pneumonia. Moderately enlarged mediastinal lymph nodes may represent reactive lymphadenopathy although pathologic lymph nodes are not excluded and follow-up is recommended.  Mild diffuse esophageal wall thickening suggesting inflammatory process.  Original Report Authenticated By: Marlon Pel, M.D.   Dg Chest Port 1 View  08/31/2011  *RADIOLOGY REPORT*  Clinical Data: Short of breath.  Respiratory distress  PORTABLE CHEST - 1 VIEW  Comparison: 08/26/2011  Findings: Progression of diffuse bilateral airspace disease.  This may be due to pulmonary edema versus pneumonia.  No significant pleural effusion.    IMPRESSION: Progression of diffuse bilateral airspace disease, probable pulmonary edema.  Original Report Authenticated By: Camelia Phenes, M.D.    Scheduled Meds:   . albuterol  2.5 mg Nebulization Q6H  . amLODipine  5 mg Oral Daily  . aspirin  324 mg Oral Pre-Cath  . aspirin  81 mg Oral Daily  . budesonide-formoterol  2 puff Inhalation BID  . carvedilol  3.125 mg Oral BID WC  . diazepam  5 mg Oral On Call  . furosemide      . furosemide  40 mg Intravenous Daily  . furosemide  40 mg Oral Daily  . heparin      . heparin      . insulin aspart  0-15 Units Subcutaneous TID WC  . insulin glargine  50 Units Subcutaneous BID  . lidocaine      . midazolam      . nitroGLYCERIN      . rosuvastatin  40 mg Oral NOW   Followed by  . rosuvastatin  40 mg Oral Q0600  . sodium chloride  3 mL Intravenous Q12H  . sodium chloride  3 mL Intravenous Q12H  . verapamil      . DISCONTD: aspirin EC  325 mg Oral Daily  . DISCONTD: azithromycin  500 mg Intravenous Q24H  . DISCONTD: cefTRIAXone (ROCEPHIN)  IV  1 g Intravenous Q24H  . DISCONTD: insulin aspart  0-15 Units  Subcutaneous Q4H  . DISCONTD: insulin aspart  0-15 Units Subcutaneous TID WC  . DISCONTD: methylPREDNISolone (SOLU-MEDROL) injection  60 mg Intravenous Q6H   Continuous Infusions:   . sodium chloride 75 mL/hr at 09/02/11 1147  . heparin    . nitroGLYCERIN 3 mcg/min (  09/02/11 1149)  . DISCONTD: heparin 1,650 Units/hr (09/02/11 0635)   PRN Meds:.sodium chloride, sodium chloride, acetaminophen, albuterol, diazepam, morphine, ondansetron (ZOFRAN) IV, sodium chloride, sodium chloride  Assessment & Plan   #1. This is a 55 year old Caucasian female with known past medical history of morbid obesity, essential hypertension, type 2 diabetes mellitus who is on insulin now, she presented to [facility] long ED yesterday with shortness of breath, as is the second episode in the last week of shortness of breath, patient does not report any fevers she does report a cough intermittently which is producing some frothy discharge. She denies any exposure to sick contacts. At the hospital there was a question whether patient had pneumonia versus CHF.   After her  workup it was decided that patient's symptoms were consistent with coronary artery disease, she was subsequently transferred to [facility] where she underwent left heart catheterization done by Dr. Riley Kill.  The catheterization showed  3 vessel coronary artery disease with significant involvement of the right coronary artery and moderately high-grade involvement of the obtuse marginal and left anterior descending artery. Pulmonary edema with marked elevation in left ventricular end-diastolic pressure likely accounting for findings on CT. Dr. Riley Kill has already talked to cardiothoracic surgery for possible CABG. She has been placed on nitro drip & IV heparin per Dr. Riley Kill, IV Lasix has been given today by Dr. Riley Kill, patient is on oxygen and feeling much better and currently symptom-free.  I do not think that patient had URI or pneumonia her history  physical exam are all consistent with acute on chronic diastolic CHF in the presence of multi-vessels CAD & NSTEMI - her leukocytosis is secondary to her steroid use which has been given during her hospital stay. I agree with Dr. Riley Kill on holding steroids and antibiotics at this time however if patient shows signs of infection this could be addressed. Will order strict eyes nose, fluid restriction, elevation of head of the bed, will put her on oral Lasix from tomorrow. Patient is already on ASA, Statin, Coreg which will be continued I have added Norvasc for better blood pressure control.  Please call and confirm pt on CTS list in am.  #2. Diabetes mellitus type 2 Will continue patient on Lantus and insulin sliding scale, after discontinuation of steroids her glycemic control should improve.  #3. History of underlying asthma no acute issues when necessary nebulizer treatments.  #4. Morbid obesity outpatient nutrition and obesity counseling.  #5. Questionable history of obstructive sleep apnea patient wears oxygen at night at home, no formal sleep study no CPAP use. Outpatient sleep study if clinically appropriate.  Patient will be followed by [facility] hospitalist from tomorrow Team 8.   DVT Prophylaxis   Heparin   See all Orders from today for further details   Time spent 55 minutes in seeing, examining patient, and over half of the total time was spent in coordinating patient care on the floor or bedisde.  Leroy Sea M.D  09/02/2011, 6:50 PM  [facility] Office  [phone]

## 2011-09-02 NOTE — Progress Notes (Signed)
ANTICOAGULATION CONSULT NOTE - Follow Up Consult  Pharmacy Consult for Heparin Indication: chest pain/ACS  No Known Allergies  Patient Measurements: Height: 5\' 7"  (170.2 cm) Weight: 311 lb 11.7 oz (141.4 kg) IBW/kg (Calculated) : 61.6  Adjusted Body Weight:   Vital Signs: Temp: 98.4 F (36.9 C) (12/14 0500) Temp src: Oral (12/13 2130) BP: 132/58 mmHg (12/14 0500) Pulse Rate: 77  (12/13 2130)  Labs:  Basename 09/02/11 0625 09/02/11 0600 09/02/11 0147 09/01/11 2225 09/01/11 1630 09/01/11 1503 09/01/11 1013 09/01/11 0500 08/31/11 1802 08/31/11 1737  HGB 13.2 -- -- -- -- -- -- 12.4 -- --  HCT 40.3 -- -- -- -- -- -- 38.3 44.0 --  PLT 254 -- -- -- -- -- -- 250 -- 235  APTT -- -- -- -- -- 29 -- -- -- --  LABPROT -- -- -- -- -- 14.0 -- -- -- --  INR -- -- -- -- -- 1.06 -- -- -- --  HEPARINUNFRC -- 0.37 -- <0.10* -- -- -- -- -- --  CREATININE 0.61 -- -- -- -- -- -- 0.80 0.80 --  CKTOTAL -- -- 133 -- 234* -- 339* -- -- --  CKMB -- -- 9.7* -- 18.0* -- 26.4* -- -- --  TROPONINI -- -- 2.18* -- 2.71* -- 1.44* -- -- --   Estimated Creatinine Clearance: 115.9 ml/min (by C-G formula based on Cr of 0.61).    Assessment: Patient on Heparin for ACS, level within goal  Goal of Therapy:  Heparin level 0.3-0.7 units/ml   Plan:  1. Continue heparin 1650 units/hr 2. Recheck level in 6hrs to verify  Rollene Fare 09/02/2011,7:26 AM Pager: [phone]

## 2011-09-02 NOTE — H&P (View-Only) (Signed)
[facility]ITAL[facility] STEP-DOWN PROGRESS NOTE   Patient Details:    Felicia Rivas is an 55 y.o. female who presented to [facility] Ed with possible PNA in a setting of recently Rx PNA She presented with SOB and Wheeze and was hypoxemic and placed on BI-PAP with some improvement She had no assosc CP or Diaphoresis, but initial EKG showed ST-T wave changes with elevations of Cardiac enzymes. Cardiology was consulted and she was taken to [facility] for Cardiac cath 12.14 am  Lines, Airways, Drains: Urethral Catheter (Active)  Site Assessment Clean;Intact 09/02/2011  7:30 AM  Collection Container Standard drainage bag 09/02/2011  7:30 AM  Securement Method Securing device (Describe) 09/02/2011  7:30 AM  Urinary Catheter Interventions Unclamped 09/02/2011  7:30 AM  Output (mL) 60 mL 09/02/2011  7:30 AM    Anti-infectives:  Anti-infectives     Start     Dose/Rate Route Frequency Ordered Stop   09/01/11 0224   cefTRIAXone (ROCEPHIN) 1 g in dextrose 5 % 50 mL IVPB        1 g 100 mL/hr over 30 Minutes Intravenous Every 24 hours 09/01/11 0224     09/01/11 0224   azithromycin (ZITHROMAX) 500 mg in dextrose 5 % 250 mL IVPB        500 mg 250 mL/hr over 60 Minutes Intravenous Every 24 hours 09/01/11 0224     08/31/11 2045   cefTRIAXone (ROCEPHIN) 1 g in dextrose 5 % 50 mL IVPB        1 g 100 mL/hr over 30 Minutes Intravenous  Once 08/31/11 2038 08/31/11 2152   08/31/11 2045   azithromycin (ZITHROMAX) 500 mg in dextrose 5 % 250 mL IVPB        500 mg 250 mL/hr over 60 Minutes Intravenous  Once 08/31/11 2038 08/31/11 2338          Microbiology: Results for orders placed during the hospital encounter of 08/31/11  CULTURE, BLOOD (ROUTINE X 2)     Status: Normal (Preliminary result)   Collection Time   08/31/11  5:40 PM      Component Value Range Status Comment   Specimen Description BLOOD LEFT HAND   Final    Special Requests BOTTLES DRAWN AEROBIC AND ANAEROBIC 5CC   Final    Setup Time [id number]    Final    Culture     Final    Value:        BLOOD CULTURE RECEIVED NO GROWTH TO DATE CULTURE WILL BE HELD FOR 5 DAYS BEFORE ISSUING A FINAL NEGATIVE REPORT   Report Status PENDING   Incomplete   CULTURE, BLOOD (ROUTINE X 2)     Status: Normal (Preliminary result)   Collection Time   08/31/11  5:42 PM      Component Value Range Status Comment   Specimen Description BLOOD RIGHT ARM   Final    Special Requests BOTTLES DRAWN AEROBIC AND ANAEROBIC 3CC   Final    Setup Time [id number]   Final    Culture     Final    Value:        BLOOD CULTURE RECEIVED NO GROWTH TO DATE CULTURE WILL BE HELD FOR 5 DAYS BEFORE ISSUING A FINAL NEGATIVE REPORT   Report Status PENDING   Incomplete   URINE CULTURE     Status: Normal   Collection Time   08/31/11  8:26 PM      Component Value Range Status Comment   Specimen Description URINE, CATHETERIZED  Final    Special Requests NONE   Final    Setup Time [id number]   Final    Colony Count NO GROWTH   Final    Culture NO GROWTH   Final    Report Status 09/02/2011 FINAL   Final   MRSA PCR SCREENING     Status: Normal   Collection Time   09/01/11 10:39 PM      Component Value Range Status Comment   MRSA by PCR NEGATIVE  NEGATIVE  Final     ABG    Component Value Date/Time   PHART 7.354 08/31/2011 1742   PCO2ART 39.7 08/31/2011 1742   PO2ART 271.0* 08/31/2011 1742   HCO3 21.5 08/31/2011 1742   TCO2 22 08/31/2011 1802   ACIDBASEDEF 3.2* 08/31/2011 1742   O2SAT 99.3 08/31/2011 1742     Best Practice/Protocols:  VTE Prophylaxis: Heparin (drip) Vancomycin/Azithromycin (12.12>>>)  Events: CT chest neg for PE 12.12 Cardiac Cath planned 12.14   Studies:  Pertinent labs and imaging all reviewed today  Consults: Treatment Team:  Crisoforo Oxford, MD   Subjective:    Overnight Issues:   Objective:  Vital signs for last 24 hours: Temp:  [98 F (36.7 C)-98.7 F (37.1 C)] 98.4 F (36.9 C) (12/14 0500) Pulse Rate:  [75-101] 75   (12/14 0730) Resp:  [16-24] 16  (12/14 0730) BP: (109-156)/(55-100) 156/73 mmHg (12/14 0730) SpO2:  [97 %-100 %] 99 % (12/14 0730) Weight:  [141.4 kg (311 lb 11.7 oz)-142.8 kg (314 lb 13.1 oz)] 311 lb 11.7 oz (141.4 kg) (12/14 0500)  Intake/Output from previous day: 12/13 0701 - 12/14 0700 In: 671.2 [P.O.:120; I.V.:251.2; IV Piggyback:300] Out: 2500 [Urine:2500]  Intake/Output this shift: Total I/O In: -  Out: 60 [Urine:60]  Physical Exam:  General: alert and no respiratory distress Neuro: alert, oriented and nonfocal exam HEENT/Neck: no JVD and Possible Bruit L carotid Resp: clear to auscultation bilaterally and normal percussion bilaterally CVS: regular rate and rhythm, S1, S2 normal, no murmur, click, rub or gallop GI: soft, nontender, BS WNL, no r/g Skin: no rash  Assessment/Plan:   NEURO  NAD   Plan: nad  PULM  Pneumonia: community-acquired (possible-unlikley)   Plan: D#2 abx-would wean to oral Azithro 500 x 3 more days in am-Leukocytosis likely 2/2 to NSTEMI>>PNA CXR tomorrow to confirm  CARDIO  Acute Coronary Syndrome: acute myocardial infarction (no ST elevation)   Plan: For Cath-on heparin Further dispo per Cards-Appreciate consult  RENAL  NAD   Plan: nad  GI  Nad   Plan: Nad  ID  Pneumonia (see above)   Plan: see above  HEME  Leukocytosis (lymphocytosis)   Plan: likely 2/2 to NSTEMI>> PNA-see above  ENDO Diabetes Mellitus (Type 2)   Plan: Continue SSI-NPO still Blood sugars 200 range  Global Issues   Patient transferred to [facility] for further management and care under cardiology [facility] as 1ry If patient stays at [facility] inform this MD so patient can be followed there-thanks   LOS: 2 days   Additional comments:I reviewed the patient's new clinical lab test results. in entirety and I reviewed the patient's other test results. in entirety  Critical Care Total Time*: 45 Minutes  [facility] 09/02/2011

## 2011-09-03 ENCOUNTER — Inpatient Hospital Stay (HOSPITAL_COMMUNITY): Payer: BC Managed Care – PPO

## 2011-09-03 ENCOUNTER — Encounter (HOSPITAL_COMMUNITY): Payer: Self-pay

## 2011-09-03 DIAGNOSIS — I214 Non-ST elevation (NSTEMI) myocardial infarction: Secondary | ICD-10-CM

## 2011-09-03 DIAGNOSIS — I2510 Atherosclerotic heart disease of native coronary artery without angina pectoris: Secondary | ICD-10-CM

## 2011-09-03 LAB — CBC
Platelets: 246 10*3/uL (ref 150–400)
RBC: 4.49 MIL/uL (ref 3.87–5.11)
WBC: 18.8 10*3/uL — ABNORMAL HIGH (ref 4.0–10.5)

## 2011-09-03 LAB — GLUCOSE, CAPILLARY
Glucose-Capillary: 211 mg/dL — ABNORMAL HIGH (ref 70–99)
Glucose-Capillary: 184 mg/dL — ABNORMAL HIGH (ref 70–99)

## 2011-09-03 LAB — BASIC METABOLIC PANEL
Calcium: 9.5 mg/dL (ref 8.4–10.5)
GFR calc Af Amer: >90 mL/min (ref >90)
GFR calc non Af Amer: 81 mL/min — ABNORMAL LOW (ref >90)
Sodium: 139 mEq/L (ref 135–145)

## 2011-09-03 LAB — HEPARIN LEVEL (UNFRACTIONATED)
Heparin Unfractionated: <0.1 IU/mL — ABNORMAL LOW (ref 0.30–0.70)
Heparin Unfractionated: 0.16 IU/mL — ABNORMAL LOW (ref 0.30–0.70)
Heparin Unfractionated: <0.1 IU/mL — ABNORMAL LOW (ref 0.30–0.70)

## 2011-09-03 MED ORDER — BUDESONIDE 0.25 MG/2ML IN SUSP
0.2500 mg | Freq: Two times a day (BID) | RESPIRATORY_TRACT | Status: DC
  Administered 2011-09-03 – 2011-09-06 (×6): 0.25 mg via RESPIRATORY_TRACT
  Filled 2011-09-03 (×10): qty 2

## 2011-09-03 MED ORDER — INSULIN ASPART 100 UNIT/ML ~~LOC~~ SOLN
0.0000 [IU] | Freq: Three times a day (TID) | SUBCUTANEOUS | Status: DC
Start: 2011-09-03 — End: 2011-09-07
  Administered 2011-09-03: 0 [IU] via SUBCUTANEOUS
  Administered 2011-09-04 – 2011-09-06 (×3): 3 [IU] via SUBCUTANEOUS
  Administered 2011-09-06: 0 [IU] via SUBCUTANEOUS
  Administered 2011-09-06: 3 [IU] via SUBCUTANEOUS

## 2011-09-03 MED ORDER — ALBUTEROL SULFATE (5 MG/ML) 0.5% IN NEBU
2.5000 mg | INHALATION_SOLUTION | Freq: Four times a day (QID) | RESPIRATORY_TRACT | Status: DC
  Administered 2011-09-03 – 2011-09-05 (×7): 2.5 mg via RESPIRATORY_TRACT
  Filled 2011-09-03 (×7): qty 0.5

## 2011-09-03 MED ORDER — HEPARIN BOLUS VIA INFUSION
2500.0000 [IU] | Freq: Once | INTRAVENOUS | Status: AC
  Administered 2011-09-03: 2500 [IU] via INTRAVENOUS
  Filled 2011-09-03: qty 2500

## 2011-09-03 MED ORDER — INSULIN GLARGINE 100 UNIT/ML ~~LOC~~ SOLN
68.0000 [IU] | Freq: Two times a day (BID) | SUBCUTANEOUS | Status: DC
  Administered 2011-09-03 – 2011-09-04 (×2): 68 [IU] via SUBCUTANEOUS

## 2011-09-03 MED ORDER — FUROSEMIDE 10 MG/ML IJ SOLN
60.0000 mg | Freq: Two times a day (BID) | INTRAMUSCULAR | Status: DC
  Administered 2011-09-03 – 2011-09-04 (×3): 60 mg via INTRAVENOUS
  Filled 2011-09-03 (×6): qty 6

## 2011-09-03 MED ORDER — FUROSEMIDE 10 MG/ML IJ SOLN
40.0000 mg | Freq: Two times a day (BID) | INTRAMUSCULAR | Status: DC
  Administered 2011-09-03: 40 mg via INTRAVENOUS
  Filled 2011-09-03 (×3): qty 4

## 2011-09-03 MED ORDER — INSULIN ASPART 100 UNIT/ML ~~LOC~~ SOLN
0.0000 [IU] | Freq: Every day | SUBCUTANEOUS | Status: DC
  Administered 2011-09-05: 2 [IU] via SUBCUTANEOUS

## 2011-09-03 NOTE — Progress Notes (Signed)
ANTICOAGULATION CONSULT NOTE - Initial Consult  Pharmacy Consult for Heparin Indication: ACS/NSTEMI  No Known Allergies  Patient Measurements: Height: 5\' 7"  (170.2 cm) Weight: 314 lb 6 oz (142.6 kg) IBW/kg (Calculated) : 61.6  Adjusted Body Weight: 96.3  Vital Signs: Temp: 97.6 F (36.4 C) (12/15 1549) Temp src: Oral (12/15 1549)  Labs:  Basename 09/03/11 1551 09/03/11 1230 09/03/11 0409 09/03/11 0408 09/02/11 0625 09/02/11 0147 09/01/11 1630 09/01/11 1503 09/01/11 1013 09/01/11 0500  HGB -- -- 13.2 -- 13.2 -- -- -- -- --  HCT -- -- 40.6 -- 40.3 -- -- -- -- 38.3  PLT -- -- 246 -- 254 -- -- -- -- 250  APTT -- -- -- -- -- -- -- 29 -- --  LABPROT -- -- -- -- -- -- -- 14.0 -- --  INR -- -- -- -- -- -- -- 1.06 -- --  HEPARINUNFRC <0.10* <0.10* -- 0.16* -- -- -- -- -- --  CREATININE -- -- 0.80 -- 0.61 -- -- -- -- 0.80  CKTOTAL -- -- -- -- -- 133 234* -- 339* --  CKMB -- -- -- -- -- 9.7* 18.0* -- 26.4* --  TROPONINI -- -- -- -- -- 2.18* 2.71* -- 1.44* --   Estimated Creatinine Clearance: 116.5 ml/min (by C-G formula based on Cr of 0.8).  Medical History: Past Medical History  Diagnosis Date  . Asthma   . Diabetes mellitus   . PONV (postoperative nausea and vomiting)   . Sleep disturbance     Had sleep study several years ago, wasn't told she has OSA but told she needs O2 at night  . Abnormal stress test     Failed stress test 3 years ago in [facility] for cough -  had f/u echo showing a "valve didn't open well" . No cath  . Myocardial infarction     Assessment: 55 YOF with NSTEMI, s/p cath. Heparin level <0.10 on 1800 units/hr. Repeated to verify and still low. RN states lines infusing well and no bleeding issues.   Goal of Therapy:  0.3-0.5 units/ml per Dr. Riley Kill   Plan:  Heparin bolus of 2500 units x1, then increase to 2000 units/hr (61ml/hr).  F/u heparin level 6 hours   Felicia Rivas 09/03/2011,4:39 PM

## 2011-09-03 NOTE — Progress Notes (Signed)
Patient Name: Felicia Rivas Date of Encounter: 09/03/2011, 8:32 AM    Subjective  No CP. Breathing feels okay. She is still feeling sluggish, weaker than usual. On a happier note, her daughter had a baby yesterday and she is now a grandma for the second time!   Objective   Telemetry: NSR with occasional PVCs (which occasionally group into pattern of quadrigeminy) Physical Exam: T 97.4, P 62, BP 127/59, RR 16, Pox 98% 2LNC General: Well developed, well nourished, obese WF in no acute distress. Head: Normocephalic, atraumatic, sclera non-icteric, no xanthomas, nares are without discharge.  Neck: Negative for carotid bruits. JVD is mildly elevated. Lungs: Decrease coarse BS at bases. No wheezes or rhonchi. Breathing is unlabored. Heart: RRR S1 S2 without murmurs, rubs, or gallops.  Abdomen: Soft, non-tender, non-distended with normoactive bowel sounds. No hepatomegaly. No rebound/guarding. No obvious abdominal masses. Msk:  Strength and tone appear normal for age. Extremities: No clubbing or cyanosis. 1-2+ edema.  Distal pedal pulses are 2+ and equal bilaterally. Neuro: Alert and oriented X 3. Moves all extremities spontaneously. Psych:  Responds to questions appropriately with a normal affect.   Weight 314 (311 yesterday) - UOP 620 yesterday with +70 but patient transferred hospitals so unsure how accurate this is.   Intake/Output Summary (Last 24 hours) at 09/03/11 0832 Last data filed at 09/03/11 0600  Gross per 24 hour  Intake    691 ml  Output    561 ml  Net    130 ml    Labs:  [facility] 09/03/11 0409 09/02/11 0625  NA 139 135  K 4.0 4.0  CL 100 100  CO2 32 26  GLUCOSE 166* 286*  BUN 23 18  CREATININE 0.80 0.61  CALCIUM 9.5 9.7  MG -- --  PHOS -- --    Basename 09/02/11 0625 08/31/11 1737  AST 20 70*  ALT 34 54*  ALKPHOS 142* 194*  BILITOT 0.2* 0.1*  PROT 7.1 7.2  ALBUMIN 3.3* 3.4*    Basename 09/03/11 0409 09/02/11 0625 08/31/11 1737  WBC 18.8* 15.1*  --  NEUTROABS -- -- 8.0*  HGB 13.2 13.2 --  HCT 40.6 40.3 --  MCV 90.4 88.0 --  PLT 246 254 --    Basename 09/02/11 0147 09/01/11 1630 09/01/11 1013 08/31/11 1712  CKTOTAL 133 234* 339* --  CKMB 9.7* 18.0* 26.4* --  TROPONINI 2.18* 2.71* 1.44* <0.30    Basename 09/01/11 0447  HGBA1C 8.1*    Basename 09/01/11 0447  TSH 1.017  T4TOTAL --  T3FREE --  THYROIDAB --   Radiology/Studies:  1. Ct Angio Chest W/cm &/or Wo Cm 08/31/2011  *RADIOLOGY REPORT* IMPRESSION: No evidence of significant central pulmonary embolus although the peripheral vessels are not well demonstrated and smaller peripheral emboli are not excluded.  Diffuse bilateral airspace disease throughout the lungs consistent with edema or pneumonia. Moderately enlarged mediastinal lymph nodes may represent reactive lymphadenopathy although pathologic lymph nodes are not excluded and follow-up is recommended.  Mild diffuse esophageal wall thickening suggesting inflammatory process.   2. Cath 09/02/11 showing : 1. 3 vessel coronary artery disease with significant involvement of the right coronary artery and moderately high-grade involvement of the obtuse marginal and left anterior descending artery.  2. Pulmonary edema with marked elevation in left ventricular end-diastolic pressure likely accounting for findings on CT.  3. 2D Echo 09/01/11: - Left ventricle: The cavity size was mildly dilated. There was moderate concentric hypertrophy. Systolic function was normal. The estimated ejection fraction was  in the range of 55% to 60%. - Left atrium: The atrium was mildly dilated. - Atrial septum: No defect or patent foramen ovale was identified.    Assessment and Plan   1. NSTEMI with newly diagnosed 3-Vessel CAD - see cath report - per Dr. Riley Kill (ran into him in the unit), surgery is going to come by today to evaluate suitability for CABG.  - Continue ASA, Coreg, Crestor, heparin. Is maintained on low-dose NTG. No CP. - No  Plavix until decision of surgery is clarified.   2. Dyspnea felt 2/2 acute pulmonary edema - EF normal by echo, likely acute diastolic CHF in the setting of MI. LVEDP elevated on cath. Less likely felt due to PNA - pt received 5 days of Avelox as OP and her pulmonary symptoms are felt most attributable to pulmonary edema. She is written for both po/IV Lasix currently. - Per discussion with Dr. Riley Kill today, will increase to Lasix 40mg  IV bid. Note that her last normal weight was 290 on 08/26/11 at outpt visit, still 314 today. - BP well controlled at present  3. Diabetes Mellitus  - appreciate hospitalist's team input.  4. Leukocytosis - afebrile. May be related to IV steroid use for initial question of URI, which has since been d/c'd. - Follow.  5. R carotid bruit - Pending inpatient dopplers with possibility of CABG.  Signed, Ronie Spies PA-C   Seen and examined agree with revised H and P .  Thurston Hole

## 2011-09-03 NOTE — Progress Notes (Signed)
Order received for Cardiac rehab per [facility] orders.  Please order cardiac rehab when appropriate and activity is advanced.

## 2011-09-03 NOTE — Progress Notes (Signed)
*  PRELIMINARY RESULTS*  Pre-CABG Carotid Dopplers has been performed.  Right - There is a 60 to 79% ICA low to mid end of scale stenosis. Left - No significant ICA stenosis. Vertebral arteries are patent with antegrade flow.   Felicia Rivas  09/03/2011, 2:29 PM

## 2011-09-03 NOTE — Progress Notes (Signed)
Subjective: This is a 55 year old Caucasian female with known past medical history of morbid obesity, essential hypertension, type 2 diabetes mellitus who is on insulin now, she presented to [facility] long ED yesterday with shortness of breath, as is the second episode in the last week of shortness of breath, patient does not report any fevers she does report a cough intermittently which is producing some frothy discharge. She denies any exposure to sick contacts. At the hospital there was a question whether patient had pneumonia versus CHF. After her workup it was decided that patient's symptoms were consistent with coronary artery disease, she was subsequently transferred to [facility] where she underwent left heart catheterization done by Dr. Riley Kill. The catheterization showed 3 vessel coronary artery disease with significant involvement of the right coronary artery and moderately high-grade involvement of the obtuse marginal and left anterior descending artery.  At the present time the patient is resting comfortably in her hospital bed. She denies chest pain fevers chills nausea vomiting or abdominal pain. She reports her shortness of breath is much improved though not quite back to her baseline. Of note the patient actually never did suffer with chest pain the  Significant procedures: CT angiogram of the chest 12/12-no evidence of pulmonary embolus  TTE 12/13-LV cavity size mildly dilated-moderate concentric hypertrophy-systolic function normal with ejection fraction 55-60%  Cardiac cath 12/14-severe three-vessel coronary artery disease  Objective: Weight change: -1.4 kg (-3 lb 1.4 oz)  Intake/Output Summary (Last 24 hours) at 09/03/11 1357 Last data filed at 09/03/11 0600  Gross per 24 hour  Intake    691 ml  Output    561 ml  Net    130 ml   Blood pressure 127/59, pulse 62, temperature 97.4 F (36.3 C), temperature source Oral, resp. rate 16, height 5\' 7"  (1.702 m), weight 142.6  kg (314 lb 6 oz), SpO2 99.00%.  Physical Exam: General: No acute respiratory distress Lungs: Mild diffuse wheezing with good air movement otherwise with exception of faint basilar crackle Cardiovascular: Regular rate and rhythm without murmur gallop or rub normal S1 and S2 Abdomen: Nontender, nondistended, soft, bowel sounds positive, no rebound, no ascites, no appreciable mass-obese Extremities: No significant cyanosis or clubbing, 2+ edema bilateral lower extremities  Lab Results:  Basename 09/03/11 0409 09/02/11 0625 09/01/11 0500  NA 139 135 137  K 4.0 4.0 3.9  CL 100 100 101  CO2 32 26 29  GLUCOSE 166* 286* 232*  BUN 23 18 15   CREATININE 0.80 0.61 0.80  CALCIUM 9.5 9.7 9.8  MG -- -- --  PHOS -- -- --    [facility] 09/02/11 0625 08/31/11 1737  AST 20 70*  ALT 34 54*  ALKPHOS 142* 194*  BILITOT 0.2* 0.1*  PROT 7.1 7.2  ALBUMIN 3.3* 3.4*   No results found for this basename: LIPASE:2,AMYLASE:2 in the last 72 hours  Basename 09/03/11 0409 09/02/11 0625 09/01/11 0500 08/31/11 1737  WBC 18.8* 15.1* 15.4* --  NEUTROABS -- -- -- 8.0*  HGB 13.2 13.2 12.4 --  HCT 40.6 40.3 38.3 --  MCV 90.4 88.0 89.1 --  PLT 246 254 250 --    Basename 09/02/11 0147 09/01/11 1630 09/01/11 1013  CKTOTAL 133 234* 339*  CKMB 9.7* 18.0* 26.4*  CKMBINDEX -- -- --  TROPONINI 2.18* 2.71* 1.44*   No components found with this basename: POCBNP:3 No results found for this basename: DDIMER:2 in the last 72 hours  Basename 09/01/11 0447  HGBA1C 8.1*   No results  found for this basename: CHOL:2,HDL:2,LDLCALC:2,TRIG:2,CHOLHDL:2,LDLDIRECT:2 in the last 72 hours  Basename 09/01/11 0447  TSH 1.017  T4TOTAL --  T3FREE --  THYROIDAB --   No results found for this basename: VITAMINB12:2,FOLATE:2,FERRITIN:2,TIBC:2,IRON:2,RETICCTPCT:2 in the last 72 hours  Micro Results: Recent Results (from the past 240 hour(s))  CULTURE, BLOOD (ROUTINE X 2)     Status: Normal (Preliminary result)   Collection  Time   08/31/11  5:40 PM      Component Value Range Status Comment   Specimen Description BLOOD LEFT HAND   Final    Special Requests BOTTLES DRAWN AEROBIC AND ANAEROBIC 5CC   Final    Setup Time [id number]   Final    Culture     Final    Value:        BLOOD CULTURE RECEIVED NO GROWTH TO DATE CULTURE WILL BE HELD FOR 5 DAYS BEFORE ISSUING A FINAL NEGATIVE REPORT   Report Status PENDING   Incomplete   CULTURE, BLOOD (ROUTINE X 2)     Status: Normal (Preliminary result)   Collection Time   08/31/11  5:42 PM      Component Value Range Status Comment   Specimen Description BLOOD RIGHT ARM   Final    Special Requests BOTTLES DRAWN AEROBIC AND ANAEROBIC 3CC   Final    Setup Time [id number]   Final    Culture     Final    Value:        BLOOD CULTURE RECEIVED NO GROWTH TO DATE CULTURE WILL BE HELD FOR 5 DAYS BEFORE ISSUING A FINAL NEGATIVE REPORT   Report Status PENDING   Incomplete   URINE CULTURE     Status: Normal   Collection Time   08/31/11  8:26 PM      Component Value Range Status Comment   Specimen Description URINE, CATHETERIZED   Final    Special Requests NONE   Final    Setup Time [id number]   Final    [place] Count NO GROWTH   Final    Culture NO GROWTH   Final    Report Status 09/02/2011 FINAL   Final   MRSA PCR SCREENING     Status: Normal   Collection Time   09/01/11 10:39 PM      Component Value Range Status Comment   MRSA by PCR NEGATIVE  NEGATIVE  Final   SURGICAL PCR SCREEN     Status: Normal   Collection Time   09/02/11  6:02 PM      Component Value Range Status Comment   MRSA, PCR NEGATIVE  NEGATIVE  Final    Staphylococcus aureus NEGATIVE  NEGATIVE  Final     Studies/Results: Scheduled Meds:   . albuterol  2.5 mg Nebulization Q6H  . amLODipine  5 mg Oral Daily  . aspirin  81 mg Oral Daily  . budesonide-formoterol  2 puff Inhalation BID  . carvedilol  3.125 mg Oral BID WC  . furosemide  40 mg Intravenous BID  . insulin aspart  0-15 Units  Subcutaneous TID WC  . insulin glargine  50 Units Subcutaneous BID  . rosuvastatin  40 mg Oral Q0600  . sodium chloride  3 mL Intravenous Q12H  . sodium chloride  3 mL Intravenous Q12H  . DISCONTD: aspirin EC  325 mg Oral Daily  . DISCONTD: azithromycin  500 mg Intravenous Q24H  . DISCONTD: cefTRIAXone (ROCEPHIN)  IV  1 g Intravenous Q24H  . DISCONTD: furosemide  40 mg Intravenous  Daily  . DISCONTD: furosemide  40 mg Oral Daily  . DISCONTD: methylPREDNISolone (SOLU-MEDROL) injection  60 mg Intravenous Q6H   Continuous Infusions:   . sodium chloride 75 mL/hr at 09/02/11 1147  . heparin 1,800 Units/hr (09/03/11 1154)  . nitroGLYCERIN 3 mcg/min (09/03/11 0600)  . DISCONTD: heparin 1,650 Units/hr (09/02/11 0635)   PRN Meds:.sodium chloride, sodium chloride, acetaminophen, albuterol, diazepam, morphine, ondansetron (ZOFRAN) IV, sodium chloride, sodium chloride  Assessment/Plan:  NSTEMI with newly diagnosed 3-Vessel CAD The patient is undergoing the necessary staging tests to eventually have bypass surgery-cardiac surgery and cardiology are both following  pulmonary edema EF 55% via echo 09/01/1999-markedly elevated left ventricular end-diastolic pressures noted via cath-I. suspect she would benefit from additional diuresis-we will watch her I.'s and O.'s as well as her renal function  Asthma Only mild expiratory wheeze at present which may in fact be exacerbated by her pulmonary edema-diurese and follow  DM2 (diabetes mellitus, type 2) Reasonably well controlled at present but there is room for improvement-I have adjusted her medical therapy  Obesity  Questionable history of OSA Would benefit from an outpatient sleep study  R carotid bruit Carotid Dopplers are a routine part of the pre-bypass surgery evaluation   LOS: 3 days   Louna Rothgeb T 09/03/2011, 1:57 PM

## 2011-09-03 NOTE — Consult Note (Signed)
[address]            [place] [address]          [phone]       Felicia Rivas [facility] Medical Record #[id number] Date of Birth: 03-25-1956  Referring cardiologist: Dr. Shawnie Pons    Chief Complaint:    Chief Complaint  Patient presents with  . Respiratory Distress    History of Present Illness:      The patient is a 55 her old morbidly obese woman with a history of diabetes and asthma who reports that over the past 2 months or so she has had episodes of dyspnea occurring with exertion and rest. About a week and a half ago she presented to the [facility] in [facility] with shortness of breath and was told that she had pneumonia. She was treated with a 5 day course of antibiotics and told to followup with her primary physician. She did followup with her primary physician last Monday and was told that she was improved. She said she still didn't feel right. Then on Wednesday of this week she was driving home from work and became suddenly short of breath and it was much more severe than usual. She pulled over into a gas station and had them call 911. She was brought into [facility] emergency room and was treated with BiPAP with improvement. A CT scan the chest showed no evidence of pulmonary embolism but did show diffuse patchy air space disease consistent with edema. She also had cardiac enzymes drawn which were positive, an elevated pro- BNP and her echocardiogram showed an ejection fraction of 55-60% with a mildly dilated left ventricle and moderate concentric left ventricular hypertrophy. There was no significant valvular disease. She improved quickly with IV Lasix. Cardiac catheterization as noted below shows significant three-vessel coronary disease.  Current Activity/ Functional Status: Patient is independent with mobility/ambulation, transfers, ADL's, IADL's.   Past Medical History  Diagnosis Date  . Asthma   . Diabetes mellitus     . PONV (postoperative nausea and vomiting)   . Sleep disturbance     Had sleep study several years ago, wasn't told she has OSA but told she needs O2 at night  . Abnormal stress test     Failed stress test 3 years ago in [facility] for cough -  had f/u echo showing a "valve didn't open well" . No cath  . Myocardial infarction     Past Surgical History  Procedure Date  . Cholecystectomy     History  Smoking status  . Former Smoker  . Types: Cigarettes  . Quit date: 10/01/2010  Smokeless tobacco  . Never Used    History  Alcohol Use No    History   Social History  . Marital Status: Married    Spouse Name: N/A    Number of Children: N/A  . Years of Education: N/A   Occupational History  . 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 Smoking status: Former Smoker    Types: Cigarettes    Quit date: 10/01/2010  . Smokeless tobacco: Never Used  . Alcohol Use: No  . Drug Use: No  . Sexually Active: No   Other Topics Concern  . Not on file   Social History Narrative  . No narrative on  file    No Known Allergies  Current Facility-Administered Medications  Medication Dose Route Frequency Provider Last Rate Last Dose  . 0.9 %  sodium chloride infusion  250 mL Intravenous PRN Houston Siren      . 0.9 %  sodium chloride infusion  250 mL Intravenous PRN Lewayne Bunting, MD      . 0.9 %  sodium chloride infusion   Intravenous Continuous Shawnie Pons, MD 75 mL/hr at 09/02/11 1147    . acetaminophen (TYLENOL) tablet 1,000 mg  1,000 mg Oral Q6H PRN Houston Siren      . albuterol (PROVENTIL) (5 MG/ML) 0.5% nebulizer solution 2.5 mg  2.5 mg Nebulization Q6H Peter Le   2.5 mg at 09/03/11 0859  . albuterol (PROVENTIL) (5 MG/ML) 0.5% nebulizer solution 2.5 mg  2.5 mg Nebulization Q2H PRN Houston Siren      . amLODipine (NORVASC) tablet 5 mg  5 mg Oral Daily Leroy Sea, MD   5 mg at 09/03/11 0924  . aspirin chewable tablet 81 mg  81 mg Oral Daily  Shawnie Pons, MD   81 mg at 09/03/11 4540  . budesonide-formoterol (SYMBICORT) 160-4.5 MCG/ACT inhaler 2 puff  2 puff Inhalation BID Peter Le   2 puff at 09/03/11 0900  . carvedilol (COREG) tablet 3.125 mg  3.125 mg Oral BID WC Pleas Koch, MD   3.125 mg at 09/03/11 0924  . diazepam (VALIUM) tablet 2 mg  2 mg Oral Q4H PRN Shawnie Pons, MD      . furosemide (LASIX) injection 40 mg  40 mg Intravenous BID Dayna N Dunn, PA   40 mg at 09/03/11 1154  . heparin ADULT infusion 100 units/ml (25000 units/250 ml)  1,800 Units/hr Intravenous Continuous Drake Leach Rumbarger, PHARMD 18 mL/hr at 09/03/11 1154 1,800 Units/hr at 09/03/11 1154  . insulin aspart (novoLOG) injection 0-15 Units  0-15 Units Subcutaneous TID [facility] Hardin Negus [place], PHARMD   3 Units at 09/03/11 9811  . insulin glargine (LANTUS) injection 50 Units  50 Units Subcutaneous BID Pleas Koch, MD   50 Units at 09/03/11 [phone]  . morphine 2 MG/ML injection 2 mg  2 mg Intravenous Q2H PRN Houston Siren      . nitroGLYCERIN 0.2 mg/mL in dextrose 5 % infusion  3 mcg/min Intravenous Continuous Shawnie Pons, MD 0.9 mL/hr at 09/03/11 0600 3 mcg/min at 09/03/11 0600  . ondansetron (ZOFRAN) injection 4 mg  4 mg Intravenous Q6H PRN Shawnie Pons, MD      . rosuvastatin (CRESTOR) tablet 40 mg  40 mg Oral Q0600 Lewayne Bunting, MD      . sodium chloride 0.9 % injection 3 mL  3 mL Intravenous Q12H Peter Le   3 mL at 09/02/11 2200  . sodium chloride 0.9 % injection 3 mL  3 mL Intravenous PRN Houston Siren      . sodium chloride 0.9 % injection 3 mL  3 mL Intravenous Q12H Lewayne Bunting, MD      . sodium chloride 0.9 % injection 3 mL  3 mL Intravenous PRN Lewayne Bunting, MD      . DISCONTD: aspirin EC tablet 325 mg  325 mg Oral Daily Peter Le   325 mg at 09/01/11 1031  . DISCONTD: azithromycin (ZITHROMAX) 500 mg in dextrose 5 % 250 mL IVPB  500 mg Intravenous Q24H Peter Le   500 mg at 09/01/11 2228  . DISCONTD: cefTRIAXone (ROCEPHIN) 1 g in dextrose 5 % 50 mL  IVPB  1 g Intravenous Q24H Peter Le   1 g at 09/01/11 2229  . DISCONTD: furosemide (LASIX) injection 40 mg  40 mg Intravenous Daily Peter Le   40 mg at 09/01/11 1029  . DISCONTD: furosemide (LASIX) tablet 40 mg  40 mg Oral Daily Leroy Sea, MD   40 mg at 09/03/11 0924  . DISCONTD: heparin ADULT infusion 100 units/ml (25000 units/250 ml)  1,650 Units/hr Intravenous Continuous [address] Darlina Guys., PHARMD 16.5 mL/hr at 09/02/11 1610 1,650 Units/hr at 09/02/11 0635  . DISCONTD: methylPREDNISolone sodium succinate (SOLU-MEDROL) 125 MG injection 60 mg  60 mg Intravenous Q6H Peter Le   60 mg at 09/02/11 0230     Family History  Problem Relation Age of Onset  . Cancer Mother   . Heart failure Mother     Also has hx MVP s/p MVR  . Diabetes Mother   . Coronary artery disease Father     Died of MI at age 36     Review of Systems:     Cardiac Review of Systems: Y or N  Chest Pain [  n  ]  Resting SOB Cove.Etienne   ] Exertional SOB  [ y ]  Pollyann Kennedy Cove.Etienne ]   Pedal Edema Cove.Etienne  ]    Palpitations [ n ] Syncope  Milo.Brash  ]  Presyncope [n ]   General Review of Systems: [Y] = yes [  ]=no  Constitional: recent weight change [y]; anorexia [ n ]; fatigue [ y ]; nausea [n ]; night sweats [n  ]; fever [n  ]; or chills [ n ];                                                                                                                                          Dental: poor dentition[n  ];    Eye : blurred vision [n ]; diplopia Milo.Brash ]; vision changes [ n ];  Amaurosis fugax[ n];  Resp: cough Cove.Etienne  ];  wheezing[ y];  hemoptysis[ n ]; shortness of breath[y ]; paroxysmal nocturnal dyspnea[ n ]; dyspnea on exertion[ y]; or orthopnea[ y ];   GI:  gallstones[ n], vomiting[ n ];  dysphagia[n]; melena[ n ];  hematochezia [n ]; heartburn[ n ];   Hx of  Colonoscopy[  n];  GU: kidney stones [n  ]; hematuria[n  ];   dysuria [n ];  nocturia[  n];  history of     obstruction [n  ];              Skin: rash, swelling[ n ];,  hair loss[n];  peripheral edema[n];  or itching[ n ];  Musculosketetal: myalgias[ n ];  joint swelling[ n ];  joint erythema[ n ];  joint pain[ n ];  back pain[ n ];   Heme/Lymph: bruising[ n ];  bleeding[ n;  anemia[ n ];  Neuro: TIA[ n ];  headaches[  n];  stroke[  n];  vertigo[ n ];  seizures[ n ];   paresthesias[  n];  difficulty walking[n  ];   Psych:depression[ n]; anxiety[ n ];   Endocrine: diabetes[  n  thyroid dysfunction[ n ]   Immunizations: Flu [ n ]; Pneumococcal[ n ];   Other:  Physical Exam: BP 127/59  Pulse 62  Temp(Src) 97.4 F (36.3 C) (Oral)  Resp 16  Ht 5\' 7"  (1.702 m)  Wt 142.6 kg (314 lb 6 oz)  BMI 49.24 kg/m2  SpO2 99%  General appearance: Morbidly obese white female in no distress. Alert and cooperative HEENT: Normocephalic and atraumatic. Pupils are equal and reactive to light and accommodation. Extraocular muscles are intact. Oropharynx is clear. Neck: Carotid pulses are palpable bilaterally. There no bruits. There is no adenopathy or thyromegaly. Neurologic: intact Heart: regular rate and rhythm, S1, S2 normal, no murmur, click, rub or gallop Lungs: rales bibasilar Abdomen: obese,soft, non-tender; bowel sounds normal; no masses,  no organomegaly Extremities: extremities normal, atraumatic, no cyanosis or edema. Pedal pulses are palpable bilaterally.   Diagnostic Studies & Laboratory data:   [facility]*               *[facility]*                       [address]                     [place], [place] [address]                         [phone]   ------------------------------------------------------------ Transthoracic Echocardiography  Patient:    Tishanna, Dunford MR #:       [id number] Study Date: 09/01/2011 Gender:     F Age:        55 Height:     170.2cm Weight:     146.5kg BSA:        2.39m^2 Pt. Status: Room:       WA30    PERFORMING   Dodson, [facility]  SONOGRAPHER  Kwong(Billy) Dory Peru, RDCS   ATTENDING    Houston Siren  REFERRING    Lynden Ang, Jai-Gurmukh cc:  ------------------------------------------------------------ LV EF: 55% -   60%  ------------------------------------------------------------ Indications:      Dyspnea 786.09.  ------------------------------------------------------------ History:   PMH:   Congestive heart failure.  Risk factors: PNA. Asthma. Diabetes mellitus.  ------------------------------------------------------------ Study Conclusions  - Left ventricle: The cavity size was mildly dilated. There   was moderate concentric hypertrophy. Systolic function was   normal. The estimated ejection fraction was in the range   of 55% to 60%. - Left atrium: The atrium was mildly dilated. - Atrial septum: No defect or patent foramen ovale was   identified. Transthoracic echocardiography.  M-mode, complete 2D, spectral Doppler, and color Doppler.  Height:  Height: 170.2cm. Height: 67in.  Weight:  Weight: 146.5kg. Weight: 322.3lb.  Body mass index:  BMI: 50.6kg/m^2.  Body surface area:    BSA: 2.5m^2.  Blood pressure:     109/52.  Patient status:  Inpatient.  Location:  Emergency department.  ------------------------------------------------------------  ------------------------------------------------------------ Left ventricle:  The cavity size was mildly dilated. There was moderate concentric hypertrophy. Systolic function was normal. The estimated ejection fraction was in the range of 55% to 60%.  ------------------------------------------------------------ Aortic valve:  Mildly thickened leaflets.  ------------------------------------------------------------ Aorta:  The aorta was normal, not dilated, and non-diseased.   ------------------------------------------------------------ Mitral valve:   Doppler:   Trivial regurgitation.    Valve area by pressure half-time: 2.68cm^2. Indexed valve area by pressure half-time:  1.08cm^2/m^2.    Mean gradient: 3mm Hg (D). Peak gradient: 11mm Hg (D).  ------------------------------------------------------------ Left atrium:  The atrium was mildly dilated.  ------------------------------------------------------------ Atrial septum:  No defect or patent foramen ovale was identified.  ------------------------------------------------------------ Right ventricle:  The cavity size was normal. Wall thickness was normal. Systolic function was normal.  ------------------------------------------------------------ Pulmonic valve:    Doppler:   Trivial regurgitation.  ------------------------------------------------------------ Tricuspid valve:   Doppler:   Mild regurgitation.  ------------------------------------------------------------ Right atrium:  The atrium was normal in size.  ------------------------------------------------------------ Pericardium:  The pericardium was normal in appearance.   ------------------------------------------------------------ Post procedure conclusions Ascending Aorta:  - The aorta was normal, not dilated, and non-diseased.  ------------------------------------------------------------  2D measurements        Normal  Doppler measurements   Normal Left ventricle                 Left ventricle LVID ED,     54 mm     43-52   Ea, lat    7.85 cm/s   ------ chord,                         ann, tiss PLAX                           DP LVID ES,     39 mm     23-38   E/Ea, lat  19.3        ------ chord,                         ann, tiss     6 PLAX                           DP FS, chord,   28 %      >29     Ea, med    5.75 cm/s   ------ PLAX                           ann, tiss LVPW, ED     16 mm     ------  DP IVS/LVPW   1.06        <1.3    E/Ea, med  26.4        ------ ratio, ED                      ann, tiss     3 Ventricular septum             DP IVS, ED      17 mm     ------  Mitral valve Aorta                          Peak E vel   152 cm/s   ------ Root diam,   30 mm     ------  Peak A vel 43.2 cm/s   ------ ED  Mean vel,  82.6 cm/s   ------ Left atrium                    D AP dim       40 mm     ------  Decelerati  134 ms     150-23 AP dim     1.61 cm/m^2 <2.2    on time                0 index                          Pressure     82 ms     ------ Vol, S       48 ml     ------  half-time Vol index, 19.4 ml/m^2 ------  Mean          3 mm Hg  ------ S                              gradient,                                D                                Peak         11 mm Hg  ------                                gradient,                                D                                Peak E/A    3.5        ------                                ratio                                Area (PHT) 2.68 cm^2   ------                                Area index 1.08 cm^2/m ------                                (PHT)           ^2                                Annulus    40.4 cm     ------  VTI                                Systemic veins                                Estimated    20 mm Hg  ------                                CVP                                Right ventricle                                Sa vel,    12.6 cm/s   ------                                lat ann,                                tiss DP   ------------------------------------------------------------ Prepared and Electronically Authenticated by  Charlton Haws 2012-12-13T14:13:11.167 Procedural Findings: Hemodynamics: AO 150/79 (106) LV 164/48  Coronary angiography: Coronary dominance: right  Left mainstem: The left main coronary stem is calcified and short but without significant obstruction.  Left anterior descending (LAD): The left anterior descending artery is fairly heavily calcified throughout its course. There is segmental plaque throughout the proximal vessel but not  high-grade significant obstruction. There is approximate 70-80% stenosis just beyond the origin of the major diagonal near a septal perforator. It does not appear to be the culprit vessel.  Left circumflex (LCx): The circumflex coronary artery provides a first marginal branch has mild ostial irregularity but no high-grade stenosis. The AV circumflex and provides a second marginal branch which has a high-grade stenosis of approximately 80% the distal portion of this large OM 2 bifurcates and is a large caliber vessel the AV circumflex supplies a small posterolateral segment and has about 60-70% narrowing. Small posterolateral segment is nongraftable. The large OM 2 is a graftable vessel.  Right coronary artery (RCA): The right coronary artery demonstrates about 40% segmental plaque near the junction of the proximal and mid vessel. It then opens up and then there is 30% narrowing at the acute margin distally, there is about an 80% area stenosis prior to the takeoff of the posterior descending branch which is large and graftable and the somewhat smaller posterolateral branch.   Final Conclusions:   1. 3 vessel coronary artery disease with significant involvement of the right coronary artery and moderately high-grade involvement of the obtuse marginal and left anterior descending artery. 2. Pulmonary edema with marked elevation in left ventricular end-diastolic pressure likely accounting for findings on CT.    Recent Radiology Findings:   Dg Chest Port 1 View  09/03/2011  *RADIOLOGY REPORT*  Clinical Data: Chest and heart failure.  Shortness of breath.  PORTABLE CHEST - 1 VIEW  Comparison: CT and plain films chest 08/31/2011.  Findings: Pulmonary edema has improved.  There is cardiomegaly.  No pneumothorax or effusion.  IMPRESSION: Improved  congestive failure.  Original Report Authenticated By: Bernadene Bell. D'ALESSIO, M.D.      *RADIOLOGY REPORT*   Clinical Data: Chest and heart failure.  Shortness of  breath.   PORTABLE CHEST - 1 VIEW   Comparison: CT and plain films chest 08/31/2011.   Findings: Pulmonary edema has improved.  There is cardiomegaly.  No pneumothorax or effusion.   IMPRESSION: Improved congestive failure.   Original Report Authenticated By: Bernadene Bell. Maricela Curet, M.D.     Imaging     DG Chest [place] [address] (Order #[id number]) on 09/02/2011 - Imaging Information    *RADIOLOGY REPORT*   Clinical Data:  Shortness of breath.  Asthma.  Pneumonia.   CT ANGIOGRAPHY CHEST WITH CONTRAST   Technique:  Multidetector CT imaging of the chest was performed using the standard protocol during bolus administration of intravenous contrast.  Multiplanar CT image reconstructions including MIPs were obtained to evaluate the vascular anatomy.   Contrast: OMNIPAQUE IOHEXOL 300 MG/ML IV SOLN   Comparison:  None   Findings:  Technically adequate study with moderately good opacification of the central and proximal segmental pulmonary arteries.   No filling defects are demonstrated in the visualized vessels.  No evidence of significant pulmonary embolus. The sub segmental pulmonary arteries are not well visualized and smaller pulmonary emboli are not excluded. Normal caliber thoracic aorta with calcification.  Mild diffuse thickening of the esophageal wall which might represent reflux disease.  Prominent hilar and mediastinal lymph nodes.  Right paratracheal lymph nodes measure up to about 17 mm in diameter.  Left aortopulmonic window lymph nodes measure about 14 mm in diameter.  These are nonspecific and could represent reactive or pathologic lymph nodes.  Recommend follow-up in 3-6 months for further evaluation.  Calcification of coronary arteries.  The lung fields demonstrate diffuse alveolar infiltrates bilaterally suggesting pulmonary edema or diffuse pneumonia. Nodular changes are not excluded.  No significant pleural effusion. No pneumothorax.  Degenerative changes in  the thoracic spine without compression deformity or bone destruction.   Review of the MIP images confirms the above findings.   IMPRESSION: No evidence of significant central pulmonary embolus although the peripheral vessels are not well demonstrated and smaller peripheral emboli are not excluded.  Diffuse bilateral airspace disease throughout the lungs consistent with edema or pneumonia. Moderately enlarged mediastinal lymph nodes may represent reactive lymphadenopathy although pathologic lymph nodes are not excluded and follow-up is recommended.  Mild diffuse esophageal wall thickening suggesting inflammatory process.   Original Report Authenticated By: Marlon Pel, M.D.     Imaging     CT Angio Chest W/Cm &amp;/Or Wo Cm (Order #[id number]) on 08/31/2011 - Imaging Information       Recent Lab Findings: Lab Results  Component Value Date   WBC 18.8* 09/03/2011   HGB 13.2 09/03/2011   HCT 40.6 09/03/2011   PLT 246 09/03/2011   GLUCOSE 166* 09/03/2011   ALT 34 09/02/2011   AST 20 09/02/2011   NA 139 09/03/2011   K 4.0 09/03/2011   CL 100 09/03/2011   CREATININE 0.80 09/03/2011   BUN 23 09/03/2011   CO2 32 09/03/2011   TSH 1.017 09/01/2011   INR 1.06 09/01/2011   HGBA1C 8.1* 09/01/2011      Assessment / Plan:      She has significant three-vessel coronary disease presenting with acute shortness of breath secondary to pulmonary edema with recurrent episodes of the same. She has recently been treated for pneumonia but I think this is most  likely pulmonary edema. She has evidence of moderate left ventricular hypertrophy by echocardiogram with a markedly elevated LVEDP by catheterization and I'm sure she has diastolic dysfunction. She had elevated cardiac enzymes at the time of presentation consistent with non-ST segment elevation MI and acute ischemia on top of her diastolic dysfunction likely lead to pulmonary edema. I agree that revascularization surgery is the best  treatment for this obese diabetic patient with three-vessel coronary disease. I think we should  continue diuresis to try clear up her lungs as much as possible before proceeding with surgery sometime early next week. I discussed the importance of maximum cardiac risk factor reduction with her and the consequences of not following through with that. I discussed the operative procedure with the patient  including alternatives, benefits and risks; including but not limited to bleeding, blood transfusion, infection, stroke, myocardial infarction, graft failure, heart block requiring a permanent pacemaker, organ dysfunction, and death.  Ashni Lonzo understands and agrees to proceed.  We will schedule surgery for sometime early next week depending on how quickly the pulmonary edema clears up on her chest x-ray.       @me1 @ 09/03/2011 1:59 PM

## 2011-09-04 DIAGNOSIS — I2510 Atherosclerotic heart disease of native coronary artery without angina pectoris: Secondary | ICD-10-CM

## 2011-09-04 LAB — CBC
HCT: 42 % (ref 36.0–46.0)
Platelets: 241 10*3/uL (ref 150–400)
RDW: 13.8 % (ref 11.5–15.5)
WBC: 12.9 10*3/uL — ABNORMAL HIGH (ref 4.0–10.5)

## 2011-09-04 LAB — GLUCOSE, CAPILLARY
Glucose-Capillary: 72 mg/dL (ref 70–99)
Glucose-Capillary: 133 mg/dL — ABNORMAL HIGH (ref 70–99)
Glucose-Capillary: 134 mg/dL — ABNORMAL HIGH (ref 70–99)

## 2011-09-04 LAB — BASIC METABOLIC PANEL
BUN: 20 mg/dL (ref 6–23)
Chloride: 96 mEq/L (ref 96–112)
GFR calc Af Amer: >90 mL/min (ref >90)
Potassium: 3.7 mEq/L (ref 3.5–5.1)

## 2011-09-04 LAB — HEPARIN LEVEL (UNFRACTIONATED)
Heparin Unfractionated: 0.28 IU/mL — ABNORMAL LOW (ref 0.30–0.70)
Heparin Unfractionated: 0.38 IU/mL (ref 0.30–0.70)

## 2011-09-04 MED ORDER — INSULIN GLARGINE 100 UNIT/ML ~~LOC~~ SOLN
75.0000 [IU] | Freq: Two times a day (BID) | SUBCUTANEOUS | Status: DC
  Administered 2011-09-04 – 2011-09-07 (×5): 75 [IU] via SUBCUTANEOUS
  Filled 2011-09-04 (×2): qty 3

## 2011-09-04 NOTE — Progress Notes (Signed)
Subjective: This is a 55 year old Caucasian female with known past medical history of morbid obesity, essential hypertension, type 2 diabetes mellitus who is on insulin now, she presented to [facility] long ED yesterday with shortness of breath, as is the second episode in the last week of shortness of breath, patient does not report any fevers she does report a cough intermittently which is producing some frothy discharge. She denies any exposure to sick contacts. At the hospital there was a question whether patient had pneumonia versus CHF. After her workup it was decided that patient's symptoms were consistent with coronary artery disease, she was subsequently transferred to [facility] where she underwent left heart catheterization done by Dr. Riley Kill. The catheterization showed 3 vessel coronary artery disease with significant involvement of the right coronary artery and moderately high-grade involvement of the obtuse marginal and left anterior descending artery.  At the present time the patient is resting comfortably in her hospital bed. She denies chest pain fevers chills nausea vomiting or abdominal pain. She reports her shortness of breath is essentially resolved.   Significant procedures: CT angiogram of the chest 12/12-no evidence of pulmonary embolus  TTE 12/13-LV cavity size mildly dilated-moderate concentric hypertrophy-systolic function normal with ejection fraction 55-60%  Cardiac cath 12/14-severe three-vessel coronary artery disease  Objective: Weight change: -3 kg (-6 lb 9.8 oz)  Intake/Output Summary (Last 24 hours) at 09/04/11 1431 Last data filed at 09/04/11 1300  Gross per 24 hour  Intake 1062.3 ml  Output   4500 ml  Net -3437.7 ml   Blood pressure 142/50, pulse 59, temperature 98.1 F (36.7 C), temperature source Oral, resp. rate 18, height 5\' 7"  (1.702 m), weight 138.4 kg (305 lb 1.9 oz), SpO2 98.00%.  Physical Exam: General: No acute respiratory  distress Lungs: Mild diffuse wheezing with good air movement otherwise with exception of faint basilar crackle Cardiovascular: Regular rate and rhythm w/o murmur gallop or rub normal S1 and S2 Abdomen: Nontender, nondistended, soft, bowel sounds positive, no rebound, no ascites, no appreciable mass-obese Extremities: No significant cyanosis or clubbing, 1+ edema bilateral lower extremities  Lab Results:  [facility] 09/04/11 0916 09/03/11 0409 09/02/11 0625  NA 139 139 135  K 3.7 4.0 4.0  CL 96 100 100  CO2 34* 32 26  GLUCOSE 186* 166* 286*  BUN 20 23 18   CREATININE 0.76 0.80 0.61  CALCIUM 9.5 9.5 9.7  MG -- -- --  PHOS -- -- --    Basename 09/02/11 0625  AST 20  ALT 34  ALKPHOS 142*  BILITOT 0.2*  PROT 7.1  ALBUMIN 3.3*    Basename 09/04/11 0916 09/03/11 0409 09/02/11 0625  WBC 12.9* 18.8* 15.1*  NEUTROABS -- -- --  HGB 13.9 13.2 13.2  HCT 42.0 40.6 40.3  MCV 89.4 90.4 88.0  PLT 241 246 254    Basename 09/02/11 0147 09/01/11 1630  CKTOTAL 133 234*  CKMB 9.7* 18.0*  CKMBINDEX -- --  TROPONINI 2.18* 2.71*   Assessment/Plan:  NSTEMI with newly diagnosed 3-Vessel CAD The patient is undergoing the necessary staging tests to eventually have bypass surgery-cardiac surgery and cardiology are both following  pulmonary edema EF 55% via echo 09/01/1999-markedly elevated left ventricular end-diastolic pressures noted via cath-a significant diuresis has begun-continue to follow her Is/Os  Asthma No wheezing at present  DM2 (diabetes mellitus, type 2) No change in treatment plan today  Obesity  Questionable history of OSA Would benefit from an outpatient sleep study  R carotid bruit Await opinion  from thoracic surgery   LOS: 4 days  09/04/2011, 2:31 PM  Lonia Blood, MD [facility] Office  [phone] Pager [phone]  On-Call/Text Page:      [email or web address]      password [facility]

## 2011-09-04 NOTE — Progress Notes (Signed)
ANTICOAGULATION CONSULT NOTE - Initial Consult  Pharmacy Consult for Heparin Indication: ACS/NSTEMI  No Known Allergies  Patient Measurements: Height: 5\' 7"  (170.2 cm) Weight: 314 lb 6 oz (142.6 kg) IBW/kg (Calculated) : 61.6  Adjusted Body Weight: 96.3  Vital Signs: Temp: 97.8 F (36.6 C) (12/15 2339) Temp src: Oral (12/15 2339) BP: 130/86 mmHg (12/15 2305)  Labs:  Basename 09/03/11 2327 09/03/11 1551 09/03/11 1230 09/03/11 0409 09/02/11 0625 09/02/11 0147 09/01/11 1630 09/01/11 1503 09/01/11 1013 09/01/11 0500  HGB -- -- -- 13.2 13.2 -- -- -- -- --  HCT -- -- -- 40.6 40.3 -- -- -- -- 38.3  PLT -- -- -- 246 254 -- -- -- -- 250  APTT -- -- -- -- -- -- -- 29 -- --  LABPROT -- -- -- -- -- -- -- 14.0 -- --  INR -- -- -- -- -- -- -- 1.06 -- --  HEPARINUNFRC 0.28* <0.10* <0.10* -- -- -- -- -- -- --  CREATININE -- -- -- 0.80 0.61 -- -- -- -- 0.80  CKTOTAL -- -- -- -- -- 133 234* -- 339* --  CKMB -- -- -- -- -- 9.7* 18.0* -- 26.4* --  TROPONINI -- -- -- -- -- 2.18* 2.71* -- 1.44* --   Estimated Creatinine Clearance: 116.5 ml/min (by C-G formula based on Cr of 0.8).  Assessment: 57 YOF with NSTEMI, s/p cath, awaiting CABG, for Heparin  Goal of Therapy: Heparin level 0.3-0.5 per Dr. Riley Kill   Plan:  Increase Heparin 2200 units/hr, f/u am labs  Amador Braddy, Gary Fleet 09/04/2011,12:53 AM

## 2011-09-04 NOTE — Progress Notes (Signed)
Patient Name: Felicia Rivas      SUBJECTIVE:feels better with no CP and SOB is much improved  Past Medical History  Diagnosis Date  . Asthma   . Diabetes mellitus   . PONV (postoperative nausea and vomiting)   . Sleep disturbance     Had sleep study several years ago, wasn't told she has OSA but told she needs O2 at night  . Abnormal stress test     Failed stress test 3 years ago in [facility] for cough -  had f/u echo showing a "valve didn't open well" . No cath  . Myocardial infarction     PHYSICAL EXAM Filed Vitals:   09/04/11 0650 09/04/11 0800 09/04/11 0822 09/04/11 0837  BP: 125/38 127/56 127/56   Pulse:   62   Temp:   98.1 F (36.7 C)   TempSrc:   Oral   Resp: 16  18   Height:      Weight:      SpO2: 97%  97% 96%   Well developed and nourished in no acute distress HENT normal Neck supple with JVP-flat Carotids brisk and full without bruits Clear Regular rate and rhythm, no murmurs or gallops Abd-soft with active BS without hepatomegaly No Clubbing cyanosis edema Skin-warm and dry A & Oriented  Grossly normal sensory and motor function  TELEMETRY: Reviewed telemetry pt in nsr:    Intake/Output Summary (Last 24 hours) at 09/04/11 1028 Last data filed at 09/04/11 0803  Gross per 24 hour  Intake  937.8 ml  Output   2300 ml  Net -1362.2 ml    LABS: Basic Metabolic Panel:  Lab 09/03/11 0454 09/02/11 0625 09/01/11 0500 08/31/11 1802 08/31/11 1737  NA 139 135 137 139 136  K 4.0 4.0 3.9 4.4 4.3  CL 100 100 101 109 102  CO2 32 26 29 -- 21  GLUCOSE 166* 286* 232* 306* 298*  BUN 23 18 15 14 13   CREATININE 0.80 0.61 0.80 0.80 0.75  CALCIUM 9.5 9.7 -- -- --  MG -- -- -- -- --  PHOS -- -- -- -- --   Cardiac Enzymes:  Basename 09/02/11 0147 09/01/11 1630  CKTOTAL 133 234*  CKMB 9.7* 18.0*  CKMBINDEX -- --  TROPONINI 2.18* 2.71*   CBC:  Lab 09/04/11 0916 09/03/11 0409 09/02/11 0625 09/01/11 0500 08/31/11 1802 08/31/11 1737  WBC 12.9* 18.8* 15.1*  15.4* -- 10.7*  NEUTROABS -- -- -- -- -- 8.0*  HGB 13.9 13.2 13.2 12.4 15.0 12.6  HCT 42.0 40.6 40.3 38.3 44.0 39.5  MCV 89.4 90.4 88.0 89.1 -- 89.0  PLT 241 246 254 250 -- 235   PROTIME:  Basename 09/01/11 1503  LABPROT 14.0  INR 1.06   Liver Function Tests:  Basename 09/02/11 0625  AST 20  ALT 34  ALKPHOS 142*  BILITOT 0.2*  PROT 7.1  ALBUMIN 3.3*    Echo LV Fn normal with concentric moderate hypertrophy    ASSESSMENT AND PLAN:  Patient Active Hospital Problem List: Possible (NSTEMI) (09/01/2011)   Assessment: withoug chestpain   Plan: continue NTG, HEP, BB and Asa; CABG early week  CHF (congestive heart failure) (08/31/2011)   Assessment: improved;    Plan: will continue IV diuresis at least one more day and follow CR      Signed, Sherryl Manges MD  09/04/2011

## 2011-09-04 NOTE — Progress Notes (Signed)
ANTICOAGULATION CONSULT NOTE - Follow Up Consult  Pharmacy Consult for IV heparin Indication: ACS/NSTEMI  No Known Allergies  Patient Measurements: Height: 5\' 7"  (170.2 cm) Weight: 305 lb 1.9 oz (138.4 kg) (stand up scale) IBW/kg (Calculated) : 61.6   Vital Signs: Temp: 98.1 F (36.7 C) (12/16 0822) Temp src: Oral (12/16 0822) BP: 127/56 mmHg (12/16 0822) Pulse Rate: 62  (12/16 0822)  Labs:  Basename 09/04/11 0916 09/03/11 2327 09/03/11 1551 09/03/11 0409 09/02/11 0625 09/02/11 0147 09/01/11 1630 09/01/11 1503  HGB 13.9 -- -- 13.2 -- -- -- --  HCT 42.0 -- -- 40.6 40.3 -- -- --  PLT 241 -- -- 246 254 -- -- --  APTT -- -- -- -- -- -- -- 29  LABPROT -- -- -- -- -- -- -- 14.0  INR -- -- -- -- -- -- -- 1.06  HEPARINUNFRC 0.38 0.28* <0.10* -- -- -- -- --  CREATININE -- -- -- 0.80 0.61 -- -- --  CKTOTAL -- -- -- -- -- 133 234* --  CKMB -- -- -- -- -- 9.7* 18.0* --  TROPONINI -- -- -- -- -- 2.18* 2.71* --   Estimated Creatinine Clearance: 114.4 ml/min (by C-G formula based on Cr of 0.8).   Medications:  Heparin drip at 2200 units/hr (22 ml/hr).   Assessment: 55 year old female with ACS/NSTEMI for CABG later this week. Heparin is therapeutic for goal. CBC is stable. No bleeding reported.   Goal of Therapy:  Heparin level 0.3-0.7 units/ml   Plan:  1. Continue Heparin at 2200units/hr (22 ml/hr).  2. Will check a 6hr heparin level to confirm.   Fayne Norrie 09/04/2011,10:41 AM

## 2011-09-04 NOTE — Plan of Care (Signed)
Problem: Phase I Progression Outcomes Goal: Vascular site scale level 0 - I Vascular Site Scale Level 0: No bruising/bleeding/hematoma Level I (Mild): Bruising/Ecchymosis, minimal bleeding/ooozing, palpable hematoma < 3 cm Level II (Moderate): Bleeding not affecting hemodynamic parameters, pseudoaneurysm, palpable hematoma > 3 cm  Outcome: Completed/Met Date Met:  09/04/11 Right Radial level 0

## 2011-09-04 NOTE — Progress Notes (Signed)
ANTICOAGULATION CONSULT NOTE - Follow Up Consult  Pharmacy Consult for IV heparin Indication: ACS/NSTEMI  No Known Allergies  Patient Measurements: Height: 5\' 7"  (170.2 cm) Weight: 305 lb 1.9 oz (138.4 kg) (stand up scale) IBW/kg (Calculated) : 61.6   Vital Signs: Temp: 98 F (36.7 C) (12/16 1602) Temp src: Oral (12/16 1602) BP: 136/67 mmHg (12/16 1602) Pulse Rate: 55  (12/16 1602)  Labs:  Basename 09/04/11 1544 09/04/11 0916 09/03/11 2327 09/03/11 0409 09/02/11 0625 09/02/11 0147  HGB -- 13.9 -- 13.2 -- --  HCT -- 42.0 -- 40.6 40.3 --  PLT -- 241 -- 246 254 --  APTT -- -- -- -- -- --  LABPROT -- -- -- -- -- --  INR -- -- -- -- -- --  HEPARINUNFRC 0.35 0.38 0.28* -- -- --  CREATININE -- 0.76 -- 0.80 0.61 --  CKTOTAL -- -- -- -- -- 133  CKMB -- -- -- -- -- 9.7*  TROPONINI -- -- -- -- -- 2.18*   Estimated Creatinine Clearance: 114.4 ml/min (by C-G formula based on Cr of 0.76).   Medications:  Heparin drip at 2200 units/hr (22 ml/hr).   Assessment: 55 year old female with ACS/NSTEMI for CABG later this week. Heparin is therapeutic for goal.   Goal of Therapy:  Heparin level 0.3-0.7 units/ml   Plan:  1. Continue Heparin at 2200units/hr (22 ml/hr).  2. Follow-up am level.   Fayne Norrie 09/04/2011,4:40 PM

## 2011-09-04 NOTE — Progress Notes (Signed)
2 Days Post-Op Procedure(s) (LRB): LEFT HEART CATHETERIZATION WITH CORONARY ANGIOGRAM (N/A) Subjective: No complaints  Objective: Vital signs in last 24 hours: Temp:  [97.8 F (36.6 C)-98.4 F (36.9 C)] 98 F (36.7 C) (12/16 1602) Pulse Rate:  [55-62] 55  (12/16 1602) Cardiac Rhythm:  [-] Sinus bradycardia (12/16 0828) Resp:  [16-20] 20  (12/16 1602) BP: (118-142)/(38-86) 136/67 mmHg (12/16 1602) SpO2:  [95 %-100 %] 99 % (12/16 1602) FiO2 (%):  [28 %] 28 % (12/16 0837) Weight:  [138.4 kg (305 lb 1.9 oz)] 305 lb 1.9 oz (138.4 kg) (12/16 0500)  Hemodynamic parameters for last 24 hours:    Intake/Output from previous day: 12/15 0701 - 12/16 0700 In: 934.9 [P.O.:180; I.V.:754.9] Out: 2500 [Urine:2500] Intake/Output this shift: Total I/O In: 263.2 [I.V.:263.2] Out: 2000 [Urine:2000]  General appearance: alert and cooperative Heart: regular rate and rhythm, S1, S2 normal, no murmur, click, rub or gallop Lungs: rales bibasilar Extremities: edema mild  Lab Results:  [facility] 09/04/11 0916 09/03/11 0409  WBC 12.9* 18.8*  HGB 13.9 13.2  HCT 42.0 40.6  PLT 241 246   BMET:  Basename 09/04/11 0916 09/03/11 0409  NA 139 139  K 3.7 4.0  CL 96 100  CO2 34* 32  GLUCOSE 186* 166*  BUN 20 23  CREATININE 0.76 0.80  CALCIUM 9.5 9.5    PT/INR: No results found for this basename: LABPROT,INR in the last 72 hours ABG    Component Value Date/Time   PHART 7.354 08/31/2011 1742   HCO3 21.5 08/31/2011 1742   TCO2 22 08/31/2011 1802   ACIDBASEDEF 3.2* 08/31/2011 1742   O2SAT 99.3 08/31/2011 1742   CBG (last 3)   Basename 09/04/11 1204 09/04/11 0824 09/04/11 0720  GLUCAP 145* 134* 133*   CXR:  Looks better  Carotid Dopplers:  Right - There is a 60 to 79% ICA low to mid end of scale stenosis. Left - No significant ICA stenosis. Vertebral arteries are patent with antegrade flow.    Assessment/Plan: S/P Procedure(s) (LRB): LEFT HEART CATHETERIZATION WITH CORONARY ANGIOGRAM  (N/A) She is improving with diuresis.  I will plan to do CABG on wed am since schedule is full until then and she will benefit from further diuresis to completely clear up pulmonary edema. Will get PFT's tomorrow.   LOS: 4 days    BARTLE,BRYAN K 09/04/2011

## 2011-09-05 ENCOUNTER — Inpatient Hospital Stay (HOSPITAL_COMMUNITY): Payer: BC Managed Care – PPO

## 2011-09-05 ENCOUNTER — Encounter (HOSPITAL_COMMUNITY): Payer: Self-pay | Admitting: Dietician

## 2011-09-05 LAB — CBC
HCT: 41.5 % (ref 36.0–46.0)
MCH: 29.6 pg (ref 26.0–34.0)
MCHC: 33.5 g/dL (ref 30.0–36.0)
MCV: 88.5 fL (ref 78.0–100.0)
RDW: 14 % (ref 11.5–15.5)

## 2011-09-05 LAB — GLUCOSE, CAPILLARY
Glucose-Capillary: 90 mg/dL (ref 70–99)
Glucose-Capillary: 140 mg/dL — ABNORMAL HIGH (ref 70–99)

## 2011-09-05 LAB — BASIC METABOLIC PANEL
BUN: 20 mg/dL (ref 6–23)
Calcium: 9.2 mg/dL (ref 8.4–10.5)
GFR calc non Af Amer: >90 mL/min (ref >90)
Glucose, Bld: 92 mg/dL (ref 70–99)

## 2011-09-05 MED ORDER — POTASSIUM CHLORIDE CRYS ER 20 MEQ PO TBCR
20.0000 meq | EXTENDED_RELEASE_TABLET | Freq: Two times a day (BID) | ORAL | Status: DC
  Administered 2011-09-05 – 2011-09-06 (×3): 20 meq via ORAL
  Filled 2011-09-05 (×5): qty 1

## 2011-09-05 MED ORDER — FUROSEMIDE 10 MG/ML IJ SOLN
40.0000 mg | Freq: Two times a day (BID) | INTRAMUSCULAR | Status: DC
  Administered 2011-09-05 – 2011-09-06 (×4): 40 mg via INTRAVENOUS
  Filled 2011-09-05 (×8): qty 4

## 2011-09-05 MED ORDER — POTASSIUM CHLORIDE CRYS ER 20 MEQ PO TBCR
40.0000 meq | EXTENDED_RELEASE_TABLET | Freq: Once | ORAL | Status: AC
  Administered 2011-09-05: 40 meq via ORAL
  Filled 2011-09-05: qty 2

## 2011-09-05 MED ORDER — SODIUM CHLORIDE 0.9 % IV SOLN
INTRAVENOUS | Status: DC
  Administered 2011-09-05 – 2011-09-06 (×2): 10 mL/h via INTRAVENOUS

## 2011-09-05 MED ORDER — ALPRAZOLAM 0.25 MG PO TABS: 0.2500 mg | ORAL_TABLET | ORAL | Status: DC | PRN

## 2011-09-05 NOTE — Progress Notes (Signed)
PT. Given Lantus 75units SQ @ 2200; At 0400 CBG= 53; 56 . Graham cracker & peanut butter; Chocolate pudding given. Reassess CBG = 90. Lantus dose need to be adjusted. Thanks.

## 2011-09-05 NOTE — Progress Notes (Signed)
@   Subjective:  Denies CP or dyspnea   Objective:  Filed Vitals:   09/04/11 2325 09/04/11 2326 09/05/11 0342 09/05/11 0500  BP: 103/64  122/51   Pulse:      Temp:  97.6 F (36.4 C) 98.4 F (36.9 C)   TempSrc:  Oral Oral   Resp: 14  18   Height:      Weight:    302 lb 7.5 oz (137.2 kg)  SpO2:  96% 99%     Intake/Output from previous day:  Intake/Output Summary (Last 24 hours) at 09/05/11 0737 Last data filed at 09/05/11 0600  Gross per 24 hour  Intake 1642.7 ml  Output   4575 ml  Net -2932.3 ml    Physical Exam: Physical exam: Well-developed obese in no acute distress.  Skin is warm and dry.  HEENT is normal.  Neck is supple. No thyromegaly.  Chest is clear to auscultation with normal expansion.  Cardiovascular exam is regular rate and rhythm.  Abdominal exam nontender or distended. No masses palpated. Extremities show no edema. neuro grossly intact    Lab Results: Basic Metabolic Panel:  Basename 09/05/11 0530 09/04/11 0916  NA 137 139  K 3.8 3.7  CL 97 96  CO2 27 34*  GLUCOSE 92 186*  BUN 20 20  CREATININE 0.69 0.76  CALCIUM 9.2 9.5  MG -- --  PHOS -- --   CBC:  Basename 09/05/11 0530 09/04/11 0916  WBC 13.8* 12.9*  NEUTROABS -- --  HGB 13.9 13.9  HCT 41.5 42.0  MCV 88.5 89.4  PLT 175 241   Cardiac Enzymes:   Assessment/Plan:  1) NSTEMI - Continue ASA, heparin, statin, NTG and coreg; add ACEI following CABG; CABG Wed. 2) acute diastolic CHF - change lasix to 40 bid; follow renal function. 3) tobacco abuse - dc 4) adenopathy - fu chest ct 6 months 5) DM - management per primary care. 6) carotid bruit - await dopplers.  Olga Millers 09/05/2011, 7:37 AM

## 2011-09-05 NOTE — Progress Notes (Signed)
Pt smokes 1/2 ppd and says she plans to quit cold [place]. She is in action stage. Referred to 1-800 quit now for f/u and support. Discussed oral fixation substitutes, second hand smoke and in home smoking policy. Reviewed and gave pt Written education/contact information.

## 2011-09-05 NOTE — Progress Notes (Signed)
Patient ID: Felicia Rivas, female   DOB: 1955-11-13, 55 y.o.   MRN: [id number]  Filed Vitals:   09/05/11 0903 09/05/11 0958 09/05/11 1146 09/05/11 1231  BP: 132/55 142/50 119/51 119/51  Pulse: 62 74  73  Temp:   98.4 F (36.9 C)   TempSrc:   Oral   Resp: 17 18 20 20   Height:      Weight:      SpO2: 99%      No chest pain or SOB. Feels well.  PFT's done and FEV1 is 79% of predicted with no change with bronchodilators.  A/P:  Stable for CABG wed am

## 2011-09-05 NOTE — Progress Notes (Addendum)
*  PRELIMINARY RESULTS*  Pre CABG Extremity Dopplers have been performed. Right BP = 147 and Left BP = 135. Palmar Arch Eval = Right waveform remains normal with radial compression and obliterates with radial compression. Left waveform remains normal with radial compression and decreases >50% with ulnar compression.  ABI completed:    RIGHT    LEFT    PRESSURE WAVEFORM  PRESSURE WAVEFORM  DP 100 mono DP 98 mono  PT 121 mono PT 138 bi  AT X X AT X X  PER X X PER X X  GREAT TOE X X GREAT TOE X X    RIGHT LEFT  ABI 0.82 0.94      Farrel Demark 09/05/2011 5:09 PM     Farrel Demark 09/05/2011, 3:43 PM

## 2011-09-05 NOTE — Progress Notes (Signed)
Inpatient Diabetes Program Recommendations  AACE/ADA: New Consensus Statement on Inpatient Glycemic Control (2009)  Target Ranges:  Prepandial:   less than 140 mg/dL      Peak postprandial:   less than 180 mg/dL (1-2 hours)      Critically ill patients:  140 - 180 mg/dL   Reason:  Hypoglycemia this a.m. CBG=53  Inpatient Diabetes Program Recommendations Insulin - Basal: Decrease Lantus to 1/2 home dose 50 units BID  Note: Will continue to follow. Piedad Climes RN, Diabetes Coordinator

## 2011-09-05 NOTE — Progress Notes (Signed)
Subjective: This is a 55 year old Caucasian female with known past medical history of morbid obesity, essential hypertension, type 2 diabetes mellitus who is on insulin now, she presented to [facility] long ED yesterday with shortness of breath, as is the second episode in the last week of shortness of breath, patient does not report any fevers she does report a cough intermittently which is producing some frothy discharge. She denies any exposure to sick contacts. At the hospital there was a question whether patient had pneumonia versus CHF. After her workup it was decided that patient's symptoms were consistent with coronary artery disease, she was subsequently transferred to [facility] where she underwent left heart catheterization done by Dr. Riley Kill. The catheterization showed 3 vessel coronary artery disease with significant involvement of the right coronary artery and moderately high-grade involvement of the obtuse marginal and left anterior descending artery.  At the present time the patient is resting comfortably in her hospital bed. She denies shortness of breath chest pain fevers chills nausea vomiting or abdominal pain.   Significant procedures: CT angiogram of the chest 12/12-no evidence of pulmonary embolus  TTE 12/13-LV cavity size mildly dilated-moderate concentric hypertrophy-systolic function normal with ejection fraction 55-60%  Cardiac cath 12/14-severe three-vessel coronary artery disease  Objective: Weight change: -1.2 kg (-2 lb 10.3 oz)  Intake/Output Summary (Last 24 hours) at 09/05/11 1830 Last data filed at 09/05/11 1700  Gross per 24 hour  Intake 1304.7 ml  Output   1950 ml  Net -645.3 ml   Blood pressure 129/58, pulse 64, temperature 98.7 F (37.1 C), temperature source Oral, resp. rate 19, height 5\' 7"  (1.702 m), weight 137.2 kg (302 lb 7.5 oz), SpO2 100.00%.  Physical Exam: General: No acute respiratory distress Lungs: Mild diffuse wheezing with good air  movement otherwise with exception of faint basilar crackle Cardiovascular: Regular rate and rhythm w/o murmur gallop or rub normal S1 and S2 Abdomen: Nontender, nondistended, soft, bowel sounds positive, no rebound, no ascites, no appreciable mass-obese Extremities: No significant cyanosis or clubbing, 1+ edema bilateral lower extremities  Lab Results:  Basename 09/05/11 0530 09/04/11 0916 09/03/11 0409  NA 137 139 139  K 3.8 3.7 4.0  CL 97 96 100  CO2 27 34* 32  GLUCOSE 92 186* 166*  BUN 20 20 23   CREATININE 0.69 0.76 0.80  CALCIUM 9.2 9.5 9.5  MG -- -- --  PHOS -- -- --   No results found for this basename: AST:2,ALT:2,ALKPHOS:2,BILITOT:2,PROT:2,ALBUMIN:2 in the last 72 hours  Basename 09/05/11 0530 09/04/11 0916 09/03/11 0409  WBC 13.8* 12.9* 18.8*  NEUTROABS -- -- --  HGB 13.9 13.9 13.2  HCT 41.5 42.0 40.6  MCV 88.5 89.4 90.4  PLT 175 241 246   No results found for this basename: CKTOTAL:3,CKMB:3,CKMBINDEX:3,TROPONINI:3 in the last 72 hours Assessment/Plan:  NSTEMI with newly diagnosed 3-Vessel CAD The patient is undergoing the necessary staging tests to eventually have bypass surgery-cardiac surgery and cardiology are both following-surgery is now planned for Wednesday  pulmonary edema EF 55% via echo 09/01/1999-markedly elevated left ventricular end-diastolic pressures noted via cath-a significant diuresis has begun-she had a -3 L net balance yesterday  Asthma No wheezing at present  DM2 (diabetes mellitus, type 2) Well-controlled at present  Obesity  Questionable history of OSA Would benefit from an outpatient sleep study  R carotid bruit As per thoracic surgery   LOS: 5 days  09/05/2011, 6:30 PM  Lonia Blood, MD [facility] Office  [phone] Pager [phone]  On-Call/Text  Page:      [email or web address]      password [facility]

## 2011-09-05 NOTE — Progress Notes (Signed)
ANTICOAGULATION CONSULT NOTE - Follow Up Consult  Pharmacy Consult for IV Heparin Indication: ACS/NSTEMI  No Known Allergies  Patient Measurements: Height: 5\' 7"  (170.2 cm) Weight: 302 lb 7.5 oz (137.2 kg) IBW/kg (Calculated) : 61.6   Vital Signs: Temp: 98.4 F (36.9 C) (12/17 1146) Temp src: Oral (12/17 1146) BP: 119/51 mmHg (12/17 1231) Pulse Rate: 73  (12/17 1231)  Labs:  Basename 09/05/11 0530 09/04/11 1544 09/04/11 0916 09/03/11 0409  HGB 13.9 -- 13.9 --  HCT 41.5 -- 42.0 40.6  PLT 175 -- 241 246  APTT -- -- -- --  LABPROT -- -- -- --  INR -- -- -- --  HEPARINUNFRC 0.33 0.35 0.38 --  CREATININE 0.69 -- 0.76 0.80  CKTOTAL -- -- -- --  CKMB -- -- -- --  TROPONINI -- -- -- --   Estimated Creatinine Clearance: 113.8 ml/min (by C-G formula based on Cr of 0.69).   Medications:  Heparin drip at 2200 units/hr (22 ml/hr).   Assessment: 55 year old female with ACS/NSTEMI for CABG later this week. Heparin is within therapeutic range.   Goal of Therapy:  Heparin level 0.3-0.7 units/ml   Plan:  1. Continue Heparin at 2200units/hr (22 ml/hr).  2. Follow-up am level.   Nadara Mustard, PharmD., MS 09/05/2011,2:46 PM

## 2011-09-06 DIAGNOSIS — I219 Acute myocardial infarction, unspecified: Secondary | ICD-10-CM

## 2011-09-06 LAB — GLUCOSE, CAPILLARY
Glucose-Capillary: 136 mg/dL — ABNORMAL HIGH (ref 70–99)
Glucose-Capillary: 142 mg/dL — ABNORMAL HIGH (ref 70–99)

## 2011-09-06 LAB — CBC
HCT: 41.8 % (ref 36.0–46.0)
MCH: 29.1 pg (ref 26.0–34.0)
MCHC: 32.8 g/dL (ref 30.0–36.0)
MCV: 88.7 fL (ref 78.0–100.0)
RDW: 13.9 % (ref 11.5–15.5)

## 2011-09-06 LAB — BASIC METABOLIC PANEL
BUN: 19 mg/dL (ref 6–23)
Calcium: 9.4 mg/dL (ref 8.4–10.5)
Creatinine, Ser: 0.83 mg/dL (ref 0.50–1.10)
GFR calc non Af Amer: 77 mL/min — ABNORMAL LOW (ref >90)
Glucose, Bld: 81 mg/dL (ref 70–99)

## 2011-09-06 LAB — CULTURE, BLOOD (ROUTINE X 2): Culture  Setup Time: 201212122310

## 2011-09-06 LAB — TYPE AND SCREEN
ABO/RH(D): O NEG
Antibody Screen: NEGATIVE

## 2011-09-06 LAB — HEPARIN LEVEL (UNFRACTIONATED): Heparin Unfractionated: 0.52 IU/mL (ref 0.30–0.70)

## 2011-09-06 MED ORDER — DOPAMINE-DEXTROSE 3.2-5 MG/ML-% IV SOLN
2.0000 ug/kg/min | INTRAVENOUS | Status: DC
  Filled 2011-09-06: qty 250

## 2011-09-06 MED ORDER — POTASSIUM CHLORIDE 2 MEQ/ML IV SOLN
80.0000 meq | INTRAVENOUS | Status: DC
  Filled 2011-09-06: qty 40

## 2011-09-06 MED ORDER — CHLORHEXIDINE GLUCONATE 4 % EX LIQD
60.0000 mL | Freq: Once | CUTANEOUS | Status: DC
  Filled 2011-09-06: qty 60

## 2011-09-06 MED ORDER — PLASMA-LYTE 148 IV SOLN
INTRAVENOUS | Status: AC
  Administered 2011-09-07: 11:00:00
  Filled 2011-09-06 (×2): qty 0.5

## 2011-09-06 MED ORDER — DEXTROSE 5 % IV SOLN
750.0000 mg | INTRAVENOUS | Status: DC
  Filled 2011-09-06 (×2): qty 750

## 2011-09-06 MED ORDER — SODIUM CHLORIDE 0.9 % IV SOLN
INTRAVENOUS | Status: DC
  Filled 2011-09-06: qty 1

## 2011-09-06 MED ORDER — SODIUM CHLORIDE 0.9 % IV SOLN
INTRAVENOUS | Status: DC
  Filled 2011-09-06: qty 40

## 2011-09-06 MED ORDER — MAGNESIUM SULFATE 50 % IJ SOLN
40.0000 meq | INTRAMUSCULAR | Status: DC
  Filled 2011-09-06: qty 10

## 2011-09-06 MED ORDER — PHENYLEPHRINE HCL 10 MG/ML IJ SOLN
30.0000 ug/min | INTRAVENOUS | Status: AC
  Administered 2011-09-07: 10 ug/min via INTRAVENOUS
  Filled 2011-09-06: qty 2

## 2011-09-06 MED ORDER — CHLORHEXIDINE GLUCONATE 4 % EX LIQD
60.0000 mL | Freq: Once | CUTANEOUS | Status: AC
  Administered 2011-09-07: 4 via TOPICAL
  Filled 2011-09-06: qty 60

## 2011-09-06 MED ORDER — DIAZEPAM 5 MG PO TABS: 10.0000 mg | ORAL_TABLET | Freq: Once | ORAL | Status: DC

## 2011-09-06 MED ORDER — SODIUM CHLORIDE 0.9 % IV SOLN
0.1000 ug/kg/h | INTRAVENOUS | Status: AC
  Administered 2011-09-07: .2 ug/kg/h via INTRAVENOUS
  Filled 2011-09-06: qty 4

## 2011-09-06 MED ORDER — DEXTROSE 5 % IV SOLN
1.5000 g | INTRAVENOUS | Status: AC
  Administered 2011-09-07: 1.5 g via INTRAVENOUS
  Filled 2011-09-06: qty 1.5

## 2011-09-06 MED ORDER — METOPROLOL TARTRATE 12.5 MG HALF TABLET
12.5000 mg | ORAL_TABLET | Freq: Once | ORAL | Status: AC
  Administered 2011-09-07: 12.5 mg via ORAL
  Filled 2011-09-06: qty 1

## 2011-09-06 MED ORDER — TEMAZEPAM 15 MG PO CAPS: 15.0000 mg | ORAL_CAPSULE | Freq: Once | ORAL | Status: AC | PRN

## 2011-09-06 MED ORDER — VANCOMYCIN HCL 1000 MG IV SOLR
1500.0000 mg | INTRAVENOUS | Status: AC
  Administered 2011-09-07: 1500 mg via INTRAVENOUS
  Filled 2011-09-06: qty 1500

## 2011-09-06 MED ORDER — BISACODYL 5 MG PO TBEC
5.0000 mg | DELAYED_RELEASE_TABLET | Freq: Once | ORAL | Status: AC
  Administered 2011-09-06: 5 mg via ORAL
  Filled 2011-09-06: qty 1

## 2011-09-06 MED ORDER — CHLORHEXIDINE GLUCONATE 4 % EX LIQD
60.0000 mL | Freq: Once | CUTANEOUS | Status: AC
  Administered 2011-09-06: 4 via TOPICAL

## 2011-09-06 MED ORDER — MAGNESIUM HYDROXIDE 400 MG/5ML PO SUSP
30.0000 mL | Freq: Every day | ORAL | Status: DC | PRN
  Administered 2011-09-06: 30 mL via ORAL
  Filled 2011-09-06: qty 30

## 2011-09-06 MED ORDER — NITROGLYCERIN IN D5W 200-5 MCG/ML-% IV SOLN
2.0000 ug/min | INTRAVENOUS | Status: AC
  Administered 2011-09-07: 16 ug/kg/min via INTRAVENOUS

## 2011-09-06 MED ORDER — EPINEPHRINE HCL 1 MG/ML IJ SOLN
0.5000 ug/min | INTRAVENOUS | Status: DC
  Filled 2011-09-06: qty 4

## 2011-09-06 MED FILL — Insulin Aspart Inj 100 Unit/ML: SUBCUTANEOUS | Qty: 3 | Status: AC

## 2011-09-06 NOTE — Progress Notes (Signed)
ANTICOAGULATION CONSULT NOTE - Follow Up Consult  Pharmacy Consult for heparin Indication: chest pain/ACS  No Known Allergies  Patient Measurements: Height: 5\' 7"  (170.2 cm) Weight: 299 lb 13.2 oz (136 kg) IBW/kg (Calculated) : 61.6  Adjusted Body Weight:   Vital Signs: Temp: 98.7 F (37.1 C) (12/18 1553) Temp src: Oral (12/18 1553) BP: 130/49 mmHg (12/18 1553) Pulse Rate: 62  (12/18 1553)  Labs:  Basename 09/06/11 1557 09/06/11 0520 09/05/11 0530 09/04/11 0916  HGB -- 13.7 13.9 --  HCT -- 41.8 41.5 42.0  PLT -- 246 175 241  APTT -- -- -- --  LABPROT -- -- -- --  INR -- -- -- --  HEPARINUNFRC 0.52 0.23* 0.33 --  CREATININE -- 0.83 0.69 0.76  CKTOTAL -- -- -- --  CKMB -- -- -- --  TROPONINI -- -- -- --   Estimated Creatinine Clearance: 109.2 ml/min (by C-G formula based on Cr of 0.83).   Assessment: NSTeMi-s/p cath on IV heparin awaiting CABG Wednesday.Heparin level now 0.52 in goal 0.3-0.7  Goal of Therapy:  Heparin level 0.3-0.7 units/ml   Plan:  Continue heparin at 2400 units/hr. Next level in am prior to surgery.  Merilynn Finland, [employer] 09/06/2011,4:53 PM

## 2011-09-06 NOTE — Progress Notes (Signed)
   CARE MANAGEMENT NOTE 09/06/2011  Patient:  Felicia Rivas, Felicia Rivas   Account Number:  [id number]  Date Initiated:  09/06/2011  Documentation initiated by:  Onnie Boer  Subjective/Objective Assessment:   PT WAS ADMITTED WITH PNA     Action/Plan:   PROGRESSION OF CARE AND DISCHARGE PLANNING   Anticipated DC Date:  09/09/2011   Anticipated DC Plan:  HOME/SELF CARE      DC Planning Services  CM consult      Choice offered to / List presented to:             Status of service:  In process, will continue to follow Medicare Important Message given?   (If response is "NO", the following Medicare IM given date fields will be blank) Date Medicare IM given:   Date Additional Medicare IM given:    Discharge Disposition:    Per UR Regulation:  Reviewed for med. necessity/level of care/duration of stay  Comments:  UR COMPLETED 09/05/2011 Onnie Boer, RN, BSN 1651 PT WAS ADMITTED WITH PNA, PTA PT WAS AT HOME WITH SELF CARE AND PLANS TO RETURN AT DC.

## 2011-09-06 NOTE — Progress Notes (Signed)
ANTICOAGULATION CONSULT NOTE - Follow Up Consult  Pharmacy Consult for heparin Indication: chest pain/ACS  No Known Allergies  Patient Measurements: Height: 5\' 7"  (170.2 cm) Weight: 299 lb 13.2 oz (136 kg) IBW/kg (Calculated) : 61.6  Adjusted Body Weight:   Vital Signs: Temp: 98.2 F (36.8 C) (12/18 0758) Temp src: Oral (12/18 0758) BP: 137/44 mmHg (12/18 0758) Pulse Rate: 67  (12/18 0758)  Labs:  Basename 09/06/11 0520 09/05/11 0530 09/04/11 1544 09/04/11 0916  HGB 13.7 13.9 -- --  HCT 41.8 41.5 -- 42.0  PLT 246 175 -- 241  APTT -- -- -- --  LABPROT -- -- -- --  INR -- -- -- --  HEPARINUNFRC 0.23* 0.33 0.35 --  CREATININE 0.83 0.69 -- 0.76  CKTOTAL -- -- -- --  CKMB -- -- -- --  TROPONINI -- -- -- --   Estimated Creatinine Clearance: 109.2 ml/min (by C-G formula based on Cr of 0.83).   Assessment: NSTeMi-s/p cath on IV heparin awaiting CABG Wednesday. Heparin just below goal, without bleeding complications. BMET/CBC within normal limits  Goal of Therapy:  Heparin level 0.3-0.7 units/ml   Plan:  infusion rate 2400 units/hr Verify heparin at goal with 6 hour level  Severiano Gilbert 09/06/2011,8:56 AM

## 2011-09-06 NOTE — Progress Notes (Addendum)
Subjective: This is a 55 year old Caucasian female with known past medical history of morbid obesity, essential hypertension, type 2 diabetes mellitus who is on insulin now, she presented to [facility] long ED yesterday with shortness of breath, as is the second episode in the last week of shortness of breath, patient does not report any fevers she does report a cough intermittently which is producing some frothy discharge. She denies any exposure to sick contacts. At the hospital there was a question whether patient had pneumonia versus CHF. After her workup it was decided that patient's symptoms were consistent with coronary artery disease, she was subsequently transferred to [facility] where she underwent left heart catheterization done by Dr. Riley Kill. The catheterization showed 3 vessel coronary artery disease with significant involvement of the right coronary artery and moderately high-grade involvement of the obtuse marginal and left anterior descending artery.  Patient is sitting up in bed and states she feels well. She has been ambulating and does not have any dyspnea. Her ankles are no longer swollen. She has only had 1 Bm in this past week and feels she needs a laxative.   Significant procedures: CT angiogram of the chest 12/12-no evidence of pulmonary embolus  TTE 12/13-LV cavity size mildly dilated-moderate concentric hypertrophy-systolic function normal with ejection fraction 55-60%  Cardiac cath 12/14-severe three-vessel coronary artery disease  Objective: Weight change: -1.2 kg (-2 lb 10.3 oz)  Intake/Output Summary (Last 24 hours) at 09/06/11 1605 Last data filed at 09/06/11 0700  Gross per 24 hour  Intake  973.5 ml  Output   1725 ml  Net -751.5 ml   Blood pressure 130/49, pulse 62, temperature 98.7 F (37.1 C), temperature source Oral, resp. rate 18, height 5\' 7"  (1.702 m), weight 136 kg (299 lb 13.2 oz), SpO2 100.00%.  Physical Exam: General: No acute respiratory  distress Lungs: clear to ausculation bilaterally.  Cardiovascular: Regular rate and rhythm w/o murmur gallop or rub normal S1 and S2 Abdomen: Nontender, nondistended, soft, bowel sounds positive, no rebound, no ascites, no appreciable mass-obese Extremities: No significant cyanosis or clubbing or edema.  Lab Results:  Basename 09/06/11 0520 09/05/11 0530 09/04/11 0916  NA 138 137 139  K 3.9 3.8 3.7  CL 99 97 96  CO2 32 27 34*  GLUCOSE 81 92 186*  BUN 19 20 20   CREATININE 0.83 0.69 0.76  CALCIUM 9.4 9.2 9.5  MG -- -- --  PHOS -- -- --   No results found for this basename: AST:2,ALT:2,ALKPHOS:2,BILITOT:2,PROT:2,ALBUMIN:2 in the last 72 hours  Basename 09/06/11 0520 09/05/11 0530 09/04/11 0916  WBC 12.5* 13.8* 12.9*  NEUTROABS -- -- --  HGB 13.7 13.9 13.9  HCT 41.8 41.5 42.0  MCV 88.7 88.5 89.4  PLT 246 175 241   No results found for this basename: CKTOTAL:3,CKMB:3,CKMBINDEX:3,TROPONINI:3 in the last 72 hours Assessment/Plan:  NSTEMI with newly diagnosed 3-Vessel CAD The patient is undergoing the necessary staging tests to eventually have bypass surgery-cardiac surgery and cardiology are both following-surgery is now planned for Wednesday  pulmonary edema due to acute diastolic CHF EF 55% via echo 09/01/1999-markedly elevated left ventricular end-diastolic pressures noted via cath-cont to diurese per cardiology.   Asthma No wheezing at present  DM2 (diabetes mellitus, type 2) Well-controlled at present  Obesity  Questionable history of OSA Would benefit from an outpatient sleep study  R carotid bruit As per thoracic surgery  Constipation- dulcolax ordered today.   Mediastinal adenopathy- f/u in 6 months   LOS: 6 days  09/06/2011, 4:05 PM  Calvert Cantor MD [phone]  On-Call/Text Page:      [email or web address]      password [facility]

## 2011-09-06 NOTE — Progress Notes (Signed)
CSW notarized advanced directives. Patient was alert and oriented. Hussain Maimone, LCSW [phone] 

## 2011-09-06 NOTE — Progress Notes (Signed)
CSW assisted with pt advanced directives, no further csw needs at this time. .No further Clinical Social Work needs, signing off.    Catha Gosselin, Theresia Majors  [phone] .09/06/2011 17:01pm

## 2011-09-06 NOTE — Progress Notes (Signed)
@   Subjective:  Denies CP or dyspnea   Objective:  Filed Vitals:   09/05/11 1926 09/05/11 2350 09/06/11 0339 09/06/11 0454  BP: 126/56 117/55 129/43   Pulse: 70 67    Temp: 98.4 F (36.9 C) 98.3 F (36.8 C) 98.2 F (36.8 C)   TempSrc: Oral Oral Oral   Resp: 19 20 16    Height:      Weight:    299 lb 13.2 oz (136 kg)  SpO2: 94% 99% 99%     Intake/Output from previous day:  Intake/Output Summary (Last 24 hours) at 09/06/11 0732 Last data filed at 09/06/11 0700  Gross per 24 hour  Intake 1589.6 ml  Output   2125 ml  Net -535.4 ml    Physical Exam: Physical exam: Well-developed obese in no acute distress.  Skin is warm and dry.  HEENT is normal.  Neck is supple. No thyromegaly.  Chest is clear to auscultation with normal expansion.  Cardiovascular exam is regular rate and rhythm.  Abdominal exam nontender or distended. No masses palpated. Extremities show no edema. neuro grossly intact    Lab Results: Basic Metabolic Panel:  Basename 09/06/11 0520 09/05/11 0530  NA 138 137  K 3.9 3.8  CL 99 97  CO2 32 27  GLUCOSE 81 92  BUN 19 20  CREATININE 0.83 0.69  CALCIUM 9.4 9.2  MG -- --  PHOS -- --   CBC:  Basename 09/06/11 0520 09/05/11 0530  WBC 12.5* 13.8*  NEUTROABS -- --  HGB 13.7 13.9  HCT 41.8 41.5  MCV 88.7 88.5  PLT 246 175   Cardiac Enzymes:   Assessment/Plan:  1) NSTEMI - Continue ASA, heparin, statin, NTG and coreg; add ACEI following CABG; CABG Wed. 2) acute diastolic CHF - continue lasix 40 bid; follow renal function. 3) tobacco abuse - dc 4) adenopathy - fu chest ct 6 months 5) DM - management per primary care. 6) carotid bruit - dopplers reveal 60-79% right (low end) and no left stenosis; fu carotid dopplers one year.  Olga Millers 09/06/2011, 7:32 AM

## 2011-09-07 ENCOUNTER — Other Ambulatory Visit: Payer: Self-pay

## 2011-09-07 ENCOUNTER — Encounter (HOSPITAL_COMMUNITY): Payer: Self-pay | Admitting: Anesthesiology

## 2011-09-07 ENCOUNTER — Encounter (HOSPITAL_COMMUNITY)
Admission: EM | Disposition: A | Discharge status: Home / Self Care | Payer: Self-pay | Source: Ambulatory Visit | Attending: Surgery

## 2011-09-07 ENCOUNTER — Inpatient Hospital Stay (HOSPITAL_COMMUNITY): Payer: BC Managed Care – PPO | Admitting: Anesthesiology

## 2011-09-07 ENCOUNTER — Inpatient Hospital Stay (HOSPITAL_COMMUNITY): Payer: BC Managed Care – PPO

## 2011-09-07 DIAGNOSIS — Z951 Presence of aortocoronary bypass graft: Secondary | ICD-10-CM

## 2011-09-07 DIAGNOSIS — I2510 Atherosclerotic heart disease of native coronary artery without angina pectoris: Secondary | ICD-10-CM

## 2011-09-07 HISTORY — DX: Presence of aortocoronary bypass graft: Z95.1

## 2011-09-07 HISTORY — PX: CORONARY ARTERY BYPASS GRAFT: SHX141

## 2011-09-07 LAB — POCT I-STAT, CHEM 8
BUN: 17 mg/dL (ref 6–23)
Chloride: 106 mEq/L (ref 96–112)
Creatinine, Ser: 0.7 mg/dL (ref 0.50–1.10)
Hemoglobin: 11.6 g/dL — ABNORMAL LOW (ref 12.0–15.0)
Potassium: 4.2 mEq/L (ref 3.5–5.1)
Sodium: 140 mEq/L (ref 135–145)

## 2011-09-07 LAB — CBC
HCT: 43.2 % (ref 36.0–46.0)
HCT: 35.1 % — ABNORMAL LOW (ref 36.0–46.0)
HCT: 35.4 % — ABNORMAL LOW (ref 36.0–46.0)
Hemoglobin: 14.3 g/dL (ref 12.0–15.0)
Hemoglobin: 11.7 g/dL — ABNORMAL LOW (ref 12.0–15.0)
MCH: 29.5 pg (ref 26.0–34.0)
MCH: 29.3 pg (ref 26.0–34.0)
MCH: 29.4 pg (ref 26.0–34.0)
MCHC: 33.3 g/dL (ref 30.0–36.0)
MCV: 89.1 fL (ref 78.0–100.0)
MCV: 88.3 fL (ref 78.0–100.0)
RBC: 4.85 MIL/uL (ref 3.87–5.11)
RBC: 3.99 MIL/uL (ref 3.87–5.11)
RDW: 14 % (ref 11.5–15.5)
WBC: 25.1 10*3/uL — ABNORMAL HIGH (ref 4.0–10.5)

## 2011-09-07 LAB — POCT I-STAT 3, ART BLOOD GAS (G3+)
Acid-Base Excess: 4 mmol/L — ABNORMAL HIGH (ref 0.0–2.0)
Acid-Base Excess: 3 mmol/L — ABNORMAL HIGH (ref 0.0–2.0)
Acid-Base Excess: 2 mmol/L (ref 0.0–2.0)
Acid-base deficit: 2 mmol/L (ref 0.0–2.0)
Bicarbonate: 28.2 mEq/L — ABNORMAL HIGH (ref 20.0–24.0)
Bicarbonate: 26.2 mEq/L — ABNORMAL HIGH (ref 20.0–24.0)
Bicarbonate: 25.7 mEq/L — ABNORMAL HIGH (ref 20.0–24.0)
Bicarbonate: 23.9 mEq/L (ref 20.0–24.0)
O2 Saturation: 100 %
O2 Saturation: 100 %
O2 Saturation: 94 %
O2 Saturation: 93 %
O2 Saturation: 92 %
Patient temperature: 35.9
TCO2: 31 mmol/L (ref 0–100)
TCO2: 28 mmol/L (ref 0–100)
TCO2: 25 mmol/L (ref 0–100)
pCO2 arterial: 48.9 mmHg — ABNORMAL HIGH (ref 35.0–45.0)
pH, Arterial: 7.359 (ref 7.350–7.400)
pO2, Arterial: 549 mmHg — ABNORMAL HIGH (ref 80.0–100.0)
pO2, Arterial: 154 mmHg — ABNORMAL HIGH (ref 80.0–100.0)
pO2, Arterial: 68 mmHg — ABNORMAL LOW (ref 80.0–100.0)
pO2, Arterial: 71 mmHg — ABNORMAL LOW (ref 80.0–100.0)
pO2, Arterial: 65 mmHg — ABNORMAL LOW (ref 80.0–100.0)

## 2011-09-07 LAB — POCT I-STAT 4, (NA,K, GLUC, HGB,HCT)
Glucose, Bld: 70 mg/dL (ref 70–99)
Glucose, Bld: 98 mg/dL (ref 70–99)
HCT: 38 % (ref 36.0–46.0)
HCT: 30 % — ABNORMAL LOW (ref 36.0–46.0)
HCT: 31 % — ABNORMAL LOW (ref 36.0–46.0)
Hemoglobin: 10.2 g/dL — ABNORMAL LOW (ref 12.0–15.0)
Hemoglobin: 12.2 g/dL (ref 12.0–15.0)
Hemoglobin: 10.5 g/dL — ABNORMAL LOW (ref 12.0–15.0)
Hemoglobin: 11.6 g/dL — ABNORMAL LOW (ref 12.0–15.0)
Potassium: 4 mEq/L (ref 3.5–5.1)
Potassium: 4.1 mEq/L (ref 3.5–5.1)
Potassium: 4.7 mEq/L (ref 3.5–5.1)
Potassium: 4.1 mEq/L (ref 3.5–5.1)
Sodium: 138 mEq/L (ref 135–145)
Sodium: 135 mEq/L (ref 135–145)
Sodium: 137 mEq/L (ref 135–145)
Sodium: 139 mEq/L (ref 135–145)

## 2011-09-07 LAB — BLOOD GAS, ARTERIAL
Acid-Base Excess: 4.5 mmol/L — ABNORMAL HIGH (ref 0.0–2.0)
Bicarbonate: 28.7 mEq/L — ABNORMAL HIGH (ref 20.0–24.0)
Patient temperature: 98.6
TCO2: 30 mmol/L (ref 0–100)
pH, Arterial: 7.426 — ABNORMAL HIGH (ref 7.350–7.400)

## 2011-09-07 LAB — HEPARIN LEVEL (UNFRACTIONATED): Heparin Unfractionated: 0.34 IU/mL (ref 0.30–0.70)

## 2011-09-07 LAB — PROTIME-INR
INR: 1.17 (ref 0.00–1.49)
Prothrombin Time: 15.1 seconds (ref 11.6–15.2)

## 2011-09-07 LAB — BASIC METABOLIC PANEL
BUN: 18 mg/dL (ref 6–23)
Calcium: 9.8 mg/dL (ref 8.4–10.5)
GFR calc non Af Amer: 81 mL/min — ABNORMAL LOW (ref >90)
Glucose, Bld: 68 mg/dL — ABNORMAL LOW (ref 70–99)

## 2011-09-07 LAB — PLATELET COUNT: Platelets: 204 10*3/uL (ref 150–400)

## 2011-09-07 LAB — CREATININE, SERUM: GFR calc non Af Amer: >90 mL/min (ref >90)

## 2011-09-07 LAB — HEMOGLOBIN AND HEMATOCRIT, BLOOD
HCT: 33.4 % — ABNORMAL LOW (ref 36.0–46.0)
Hemoglobin: 10.9 g/dL — ABNORMAL LOW (ref 12.0–15.0)

## 2011-09-07 LAB — APTT: aPTT: 27 seconds (ref 24–37)

## 2011-09-07 LAB — GLUCOSE, CAPILLARY: Glucose-Capillary: 133 mg/dL — ABNORMAL HIGH (ref 70–99)

## 2011-09-07 SURGERY — CORONARY ARTERY BYPASS GRAFTING (CABG)
Anesthesia: General | Site: Chest | Wound class: Clean

## 2011-09-07 MED ORDER — ROCURONIUM BROMIDE 100 MG/10ML IV SOLN
INTRAVENOUS | Status: DC | PRN
  Administered 2011-09-07 (×2): 50 mg via INTRAVENOUS
  Administered 2011-09-07: 100 mg via INTRAVENOUS
  Administered 2011-09-07: 50 mg via INTRAVENOUS

## 2011-09-07 MED ORDER — 0.9 % SODIUM CHLORIDE (POUR BTL) OPTIME
TOPICAL | Status: DC | PRN
  Administered 2011-09-07: 1000 mL

## 2011-09-07 MED ORDER — LACTATED RINGERS IV SOLN: 500.0000 mL | Freq: Once | INTRAVENOUS | Status: AC | PRN

## 2011-09-07 MED ORDER — ACETAMINOPHEN 500 MG PO TABS
1000.0000 mg | ORAL_TABLET | Freq: Four times a day (QID) | ORAL | Status: DC
  Administered 2011-09-08 – 2011-09-12 (×16): 1000 mg via ORAL
  Filled 2011-09-07 (×18): qty 2

## 2011-09-07 MED ORDER — HEMOSTATIC AGENTS (NO CHARGE) OPTIME
TOPICAL | Status: DC | PRN
  Administered 2011-09-07: 1 via TOPICAL

## 2011-09-07 MED ORDER — FENTANYL CITRATE 0.05 MG/ML IJ SOLN
INTRAMUSCULAR | Status: DC | PRN
  Administered 2011-09-07: 250 ug via INTRAVENOUS
  Administered 2011-09-07: 900 ug via INTRAVENOUS
  Administered 2011-09-07: 100 ug via INTRAVENOUS
  Administered 2011-09-07: 250 ug via INTRAVENOUS

## 2011-09-07 MED ORDER — SODIUM CHLORIDE 0.45 % IV SOLN
INTRAVENOUS | Status: DC
  Administered 2011-09-07 – 2011-09-08 (×2): via INTRAVENOUS

## 2011-09-07 MED ORDER — PHENYLEPHRINE HCL 10 MG/ML IJ SOLN: 0.0000 ug/min | INTRAVENOUS | Status: DC

## 2011-09-07 MED ORDER — SODIUM CHLORIDE 0.9 % IV SOLN
100.0000 [IU] | INTRAVENOUS | Status: DC | PRN
  Administered 2011-09-07: .5 [IU]/h via INTRAVENOUS

## 2011-09-07 MED ORDER — SUCCINYLCHOLINE CHLORIDE 20 MG/ML IJ SOLN
INTRAMUSCULAR | Status: DC | PRN
  Administered 2011-09-07: 140 mg via INTRAVENOUS

## 2011-09-07 MED ORDER — ALBUMIN HUMAN 5 % IV SOLN
250.0000 mL | INTRAVENOUS | Status: AC | PRN
  Administered 2011-09-07: 250 mL via INTRAVENOUS

## 2011-09-07 MED ORDER — MORPHINE SULFATE 2 MG/ML IJ SOLN
1.0000 mg | INTRAMUSCULAR | Status: AC | PRN
  Administered 2011-09-07 (×2): 2 mg via INTRAVENOUS
  Filled 2011-09-07 (×5): qty 1

## 2011-09-07 MED ORDER — ACETAMINOPHEN 160 MG/5ML PO SOLN
975.0000 mg | Freq: Four times a day (QID) | ORAL | Status: DC
  Filled 2011-09-07: qty 40.6

## 2011-09-07 MED ORDER — DEXMEDETOMIDINE HCL 100 MCG/ML IV SOLN: 0.1000 ug/kg/h | INTRAVENOUS | Status: DC

## 2011-09-07 MED ORDER — MAGNESIUM SULFATE 40 MG/ML IJ SOLN
INTRAMUSCULAR | Status: AC
  Administered 2011-09-07: 4 g via INTRAVENOUS
  Filled 2011-09-07: qty 100

## 2011-09-07 MED ORDER — PROPOFOL 10 MG/ML IV EMUL
INTRAVENOUS | Status: DC | PRN
  Administered 2011-09-07: 30 mg via INTRAVENOUS

## 2011-09-07 MED ORDER — INSULIN ASPART 100 UNIT/ML ~~LOC~~ SOLN
0.0000 [IU] | SUBCUTANEOUS | Status: AC
  Administered 2011-09-07: 2 [IU] via SUBCUTANEOUS
  Filled 2011-09-07 (×2): qty 3

## 2011-09-07 MED ORDER — ACETAMINOPHEN 650 MG RE SUPP
650.0000 mg | RECTAL | Status: AC
  Administered 2011-09-07: 650 mg via RECTAL

## 2011-09-07 MED ORDER — PROTAMINE SULFATE 10 MG/ML IV SOLN
INTRAVENOUS | Status: DC | PRN
  Administered 2011-09-07: 380 mg via INTRAVENOUS

## 2011-09-07 MED ORDER — SODIUM CHLORIDE 0.9 % IV SOLN
INTRAVENOUS | Status: DC
  Filled 2011-09-07: qty 1

## 2011-09-07 MED ORDER — ASPIRIN 81 MG PO CHEW: 324.0000 mg | CHEWABLE_TABLET | Freq: Every day | ORAL | Status: DC

## 2011-09-07 MED ORDER — MAGNESIUM SULFATE 40 MG/ML IJ SOLN
4.0000 g | Freq: Once | INTRAMUSCULAR | Status: AC
  Administered 2011-09-07: 4 g via INTRAVENOUS

## 2011-09-07 MED ORDER — ALBUMIN HUMAN 5 % IV SOLN
INTRAVENOUS | Status: DC | PRN
  Administered 2011-09-07: 12:00:00 via INTRAVENOUS

## 2011-09-07 MED ORDER — DEXTROSE 5 % IV SOLN
1.5000 g | Freq: Two times a day (BID) | INTRAVENOUS | Status: AC
  Administered 2011-09-07 – 2011-09-09 (×4): 1.5 g via INTRAVENOUS
  Filled 2011-09-07 (×4): qty 1.5

## 2011-09-07 MED ORDER — METOPROLOL TARTRATE 12.5 MG HALF TABLET
12.5000 mg | ORAL_TABLET | Freq: Two times a day (BID) | ORAL | Status: DC
  Filled 2011-09-07 (×3): qty 1

## 2011-09-07 MED ORDER — MORPHINE SULFATE 4 MG/ML IJ SOLN
2.0000 mg | INTRAMUSCULAR | Status: DC | PRN
  Administered 2011-09-08 – 2011-09-09 (×4): 4 mg via INTRAVENOUS
  Filled 2011-09-07 (×4): qty 1

## 2011-09-07 MED ORDER — SODIUM CHLORIDE 0.9 % IJ SOLN: 3.0000 mL | INTRAMUSCULAR | Status: DC | PRN

## 2011-09-07 MED ORDER — MIDAZOLAM HCL 5 MG/5ML IJ SOLN
INTRAMUSCULAR | Status: DC | PRN
  Administered 2011-09-07 (×2): 2 mg via INTRAVENOUS
  Administered 2011-09-07 (×2): 3 mg via INTRAVENOUS

## 2011-09-07 MED ORDER — BISACODYL 5 MG PO TBEC
10.0000 mg | DELAYED_RELEASE_TABLET | Freq: Every day | ORAL | Status: DC
  Administered 2011-09-08 – 2011-09-09 (×2): 10 mg via ORAL
  Filled 2011-09-07 (×2): qty 2

## 2011-09-07 MED ORDER — HEPARIN SODIUM (PORCINE) 1000 UNIT/ML IJ SOLN
INTRAMUSCULAR | Status: DC | PRN
  Administered 2011-09-07: 38000 [IU] via INTRAVENOUS

## 2011-09-07 MED ORDER — DOCUSATE SODIUM 100 MG PO CAPS
200.0000 mg | ORAL_CAPSULE | Freq: Every day | ORAL | Status: DC
  Administered 2011-09-08 – 2011-09-11 (×3): 200 mg via ORAL
  Filled 2011-09-07 (×5): qty 2

## 2011-09-07 MED ORDER — METOPROLOL TARTRATE 25 MG/10 ML ORAL SUSPENSION
12.5000 mg | Freq: Two times a day (BID) | ORAL | Status: DC
  Filled 2011-09-07 (×3): qty 5

## 2011-09-07 MED ORDER — OXYCODONE HCL 5 MG PO TABS
5.0000 mg | ORAL_TABLET | ORAL | Status: DC | PRN
  Administered 2011-09-08 (×2): 10 mg via ORAL
  Administered 2011-09-09: 5 mg via ORAL
  Administered 2011-09-10: 10 mg via ORAL
  Administered 2011-09-10 – 2011-09-11 (×3): 5 mg via ORAL
  Administered 2011-09-12: 10 mg via ORAL
  Filled 2011-09-07: qty 1
  Filled 2011-09-07: qty 2
  Filled 2011-09-07: qty 1
  Filled 2011-09-07 (×2): qty 2
  Filled 2011-09-07 (×2): qty 1
  Filled 2011-09-07: qty 2
  Filled 2011-09-07: qty 1

## 2011-09-07 MED ORDER — SODIUM CHLORIDE 0.9 % IV SOLN: INTRAVENOUS | Status: DC

## 2011-09-07 MED ORDER — ONDANSETRON HCL 4 MG/2ML IJ SOLN
4.0000 mg | Freq: Four times a day (QID) | INTRAMUSCULAR | Status: DC | PRN
  Administered 2011-09-08: 4 mg via INTRAVENOUS
  Filled 2011-09-07: qty 2

## 2011-09-07 MED ORDER — SODIUM CHLORIDE 0.9 % IV SOLN
10.0000 g | INTRAVENOUS | Status: DC | PRN
  Administered 2011-09-07: 5 g/h via INTRAVENOUS

## 2011-09-07 MED ORDER — METOPROLOL TARTRATE 1 MG/ML IV SOLN: 2.5000 mg | INTRAVENOUS | Status: DC | PRN

## 2011-09-07 MED ORDER — SODIUM CHLORIDE 0.9 % IV SOLN
5.0000 g | INTRAVENOUS | Status: DC
  Filled 2011-09-07: qty 20

## 2011-09-07 MED ORDER — ACETAMINOPHEN 160 MG/5ML PO SOLN: 650.0000 mg | ORAL | Status: AC

## 2011-09-07 MED ORDER — EPTIFIBATIDE 75 MG/100ML IV SOLN
2.0000 ug/kg/min | INTRAVENOUS | Status: AC
  Filled 2011-09-07 (×2): qty 100

## 2011-09-07 MED ORDER — SODIUM CHLORIDE 0.9 % IJ SOLN
3.0000 mL | Freq: Two times a day (BID) | INTRAMUSCULAR | Status: DC
  Administered 2011-09-08 – 2011-09-09 (×3): 3 mL via INTRAVENOUS

## 2011-09-07 MED ORDER — PAPAVERINE HCL 30 MG/ML IJ SOLN
INTRAMUSCULAR | Status: DC | PRN
  Administered 2011-09-07: 60 mg

## 2011-09-07 MED ORDER — SODIUM CHLORIDE 0.9 % IV SOLN: 250.0000 mL | INTRAVENOUS | Status: DC

## 2011-09-07 MED ORDER — INSULIN ASPART 100 UNIT/ML ~~LOC~~ SOLN
0.0000 [IU] | SUBCUTANEOUS | Status: DC
  Administered 2011-09-08: 2 [IU] via SUBCUTANEOUS

## 2011-09-07 MED ORDER — DIAZEPAM 5 MG PO TABS
10.0000 mg | ORAL_TABLET | Freq: Once | ORAL | Status: AC
  Administered 2011-09-07: 10 mg via ORAL
  Filled 2011-09-07: qty 2

## 2011-09-07 MED ORDER — MIDAZOLAM HCL 2 MG/2ML IJ SOLN: 2.0000 mg | INTRAMUSCULAR | Status: DC | PRN

## 2011-09-07 MED ORDER — FAMOTIDINE IN NACL 20-0.9 MG/50ML-% IV SOLN
20.0000 mg | Freq: Two times a day (BID) | INTRAVENOUS | Status: AC
  Administered 2011-09-07: 20 mg via INTRAVENOUS

## 2011-09-07 MED ORDER — BISACODYL 10 MG RE SUPP
10.0000 mg | Freq: Every day | RECTAL | Status: DC
  Administered 2011-09-11: 10 mg via RECTAL
  Filled 2011-09-07: qty 1

## 2011-09-07 MED ORDER — ASPIRIN EC 325 MG PO TBEC
325.0000 mg | DELAYED_RELEASE_TABLET | Freq: Every day | ORAL | Status: DC
  Administered 2011-09-08 – 2011-09-11 (×4): 325 mg via ORAL
  Filled 2011-09-07 (×5): qty 1

## 2011-09-07 MED ORDER — DEXMEDETOMIDINE HCL 100 MCG/ML IV SOLN
200.0000 ug | INTRAVENOUS | Status: DC
  Filled 2011-09-07: qty 2

## 2011-09-07 MED ORDER — LACTATED RINGERS IV SOLN
INTRAVENOUS | Status: DC | PRN
  Administered 2011-09-07: 07:00:00 via INTRAVENOUS

## 2011-09-07 MED ORDER — DEXTROSE 5 % IV SOLN
0.7500 g | INTRAVENOUS | Status: DC | PRN
  Administered 2011-09-07: .75 g via INTRAVENOUS

## 2011-09-07 MED ORDER — VANCOMYCIN HCL 1000 MG IV SOLR
1000.0000 mg | Freq: Once | INTRAVENOUS | Status: AC
  Administered 2011-09-07: 1000 mg via INTRAVENOUS
  Filled 2011-09-07: qty 1000

## 2011-09-07 MED ORDER — POTASSIUM CHLORIDE 10 MEQ/50ML IV SOLN: 10.0000 meq | INTRAVENOUS | Status: AC

## 2011-09-07 MED ORDER — LACTATED RINGERS IV SOLN
INTRAVENOUS | Status: DC
  Administered 2011-09-07: 14:00:00 via INTRAVENOUS

## 2011-09-07 MED ORDER — THROMBIN 20000 UNITS EX KIT
PACK | OROMUCOSAL | Status: DC | PRN
  Administered 2011-09-07: 11:00:00 via TOPICAL

## 2011-09-07 MED ORDER — DEXTROSE 50 % IV SOLN
INTRAVENOUS | Status: DC | PRN
  Administered 2011-09-07: 12 mL via INTRAVENOUS

## 2011-09-07 MED ORDER — NITROGLYCERIN IN D5W 200-5 MCG/ML-% IV SOLN: 0.0000 ug/min | INTRAVENOUS | Status: DC

## 2011-09-07 MED ORDER — PANTOPRAZOLE SODIUM 40 MG PO TBEC
40.0000 mg | DELAYED_RELEASE_TABLET | Freq: Every day | ORAL | Status: DC
  Administered 2011-09-09 – 2011-09-11 (×2): 40 mg via ORAL
  Filled 2011-09-07: qty 1

## 2011-09-07 SURGICAL SUPPLY — 109 items
ADAPTER CARDIO PERF ANTE/RETRO (ADAPTER) IMPLANT
ATTRACTOMAT 16X20 MAGNETIC DRP (DRAPES) ×2 IMPLANT
BAG DECANTER FOR FLEXI CONT (MISCELLANEOUS) ×2 IMPLANT
BANDAGE ELASTIC 4 VELCRO ST LF (GAUZE/BANDAGES/DRESSINGS) ×2 IMPLANT
BANDAGE ELASTIC 6 VELCRO ST LF (GAUZE/BANDAGES/DRESSINGS) ×2 IMPLANT
BANDAGE GAUZE ELAST BULKY 4 IN (GAUZE/BANDAGES/DRESSINGS) ×2 IMPLANT
BASKET HEART (ORDER IN 25'S) (MISCELLANEOUS) ×1
BASKET HEART (ORDER IN 25S) (MISCELLANEOUS) ×1 IMPLANT
BENZOIN TINCTURE PRP APPL 2/3 (GAUZE/BANDAGES/DRESSINGS) ×2 IMPLANT
BLADE SAW STERNAL (BLADE) ×2 IMPLANT
BLADE SURG 11 STRL SS (BLADE) ×2 IMPLANT
BLADE SURG ROTATE 9660 (MISCELLANEOUS) IMPLANT
CANISTER SUCTION 2500CC (MISCELLANEOUS) ×2 IMPLANT
CANNULA GUNDRY RCSP 15FR (MISCELLANEOUS) IMPLANT
CATH ROBINSON RED A/P 18FR (CATHETERS) ×4 IMPLANT
CATH THORACIC 28FR (CATHETERS) ×2 IMPLANT
CATH THORACIC 28FR RT ANG (CATHETERS) IMPLANT
CATH THORACIC 36FR (CATHETERS) ×2 IMPLANT
CATH THORACIC 36FR RT ANG (CATHETERS) ×2 IMPLANT
CLIP FOGARTY SPRING 6M (CLIP) IMPLANT
CLIP RETRACTION 3.0MM CORONARY (MISCELLANEOUS) ×2 IMPLANT
CLIP TI MEDIUM 24 (CLIP) IMPLANT
CLIP TI WIDE RED SMALL 24 (CLIP) ×2 IMPLANT
CLOTH BEACON ORANGE TIMEOUT ST (SAFETY) ×2 IMPLANT
COVER SURGICAL LIGHT HANDLE (MISCELLANEOUS) ×4 IMPLANT
CRADLE DONUT ADULT HEAD (MISCELLANEOUS) ×2 IMPLANT
DRAPE CARDIOVASCULAR INCISE (DRAPES) ×1
DRAPE SLUSH MACHINE 52X66 (DRAPES) IMPLANT
DRAPE SLUSH/WARMER DISC (DRAPES) IMPLANT
DRAPE SRG 135X102X78XABS (DRAPES) ×1 IMPLANT
DRSG COVADERM 4X14 (GAUZE/BANDAGES/DRESSINGS) ×2 IMPLANT
ELECT CAUTERY BLADE 6.4 (BLADE) ×2 IMPLANT
ELECT REM PT RETURN 9FT ADLT (ELECTROSURGICAL) ×4
ELECTRODE REM PT RTRN 9FT ADLT (ELECTROSURGICAL) ×2 IMPLANT
GLOVE BIO SURGEON STRL SZ 6 (GLOVE) ×4 IMPLANT
GLOVE BIO SURGEON STRL SZ 6.5 (GLOVE) ×10 IMPLANT
GLOVE BIO SURGEON STRL SZ7 (GLOVE) IMPLANT
GLOVE BIO SURGEON STRL SZ7.5 (GLOVE) IMPLANT
GLOVE BIOGEL PI IND STRL 6 (GLOVE) IMPLANT
GLOVE BIOGEL PI IND STRL 6.5 (GLOVE) ×2 IMPLANT
GLOVE BIOGEL PI IND STRL 7.0 (GLOVE) IMPLANT
GLOVE BIOGEL PI INDICATOR 6 (GLOVE)
GLOVE BIOGEL PI INDICATOR 6.5 (GLOVE) ×2
GLOVE BIOGEL PI INDICATOR 7.0 (GLOVE)
GLOVE EUDERMIC 7 POWDERFREE (GLOVE) ×4 IMPLANT
GLOVE ORTHO TXT STRL SZ7.5 (GLOVE) IMPLANT
GOWN PREVENTION PLUS XLARGE (GOWN DISPOSABLE) ×2 IMPLANT
GOWN STRL NON-REIN LRG LVL3 (GOWN DISPOSABLE) ×10 IMPLANT
HEMOSTAT POWDER SURGIFOAM 1G (HEMOSTASIS) ×6 IMPLANT
HEMOSTAT SURGICEL 2X14 (HEMOSTASIS) ×2 IMPLANT
INSERT FOGARTY 61MM (MISCELLANEOUS) IMPLANT
INSERT FOGARTY XLG (MISCELLANEOUS) IMPLANT
KIT BASIN OR (CUSTOM PROCEDURE TRAY) ×2 IMPLANT
KIT CATH CPB BARTLE (MISCELLANEOUS) ×2 IMPLANT
KIT ROOM TURNOVER OR (KITS) ×2 IMPLANT
KIT SUCTION CATH 14FR (SUCTIONS) ×2 IMPLANT
KIT VASOVIEW W/TROCAR VH 2000 (KITS) ×2 IMPLANT
NS IRRIG 1000ML POUR BTL (IV SOLUTION) ×12 IMPLANT
PACK OPEN HEART (CUSTOM PROCEDURE TRAY) ×2 IMPLANT
PAD ARMBOARD 7.5X6 YLW CONV (MISCELLANEOUS) ×4 IMPLANT
PENCIL BUTTON HOLSTER BLD 10FT (ELECTRODE) ×2 IMPLANT
PUNCH AORTIC ROTATE 4.0MM (MISCELLANEOUS) IMPLANT
PUNCH AORTIC ROTATE 4.5MM 8IN (MISCELLANEOUS) ×2 IMPLANT
PUNCH AORTIC ROTATE 5MM 8IN (MISCELLANEOUS) IMPLANT
SET CARDIOPLEGIA MPS 5001102 (MISCELLANEOUS) ×2 IMPLANT
SOLUTION ANTI FOG 6CC (MISCELLANEOUS) IMPLANT
SPONGE GAUZE 4X4 12PLY (GAUZE/BANDAGES/DRESSINGS) ×6 IMPLANT
SPONGE INTESTINAL PEANUT (DISPOSABLE) IMPLANT
SPONGE LAP 18X18 X RAY DECT (DISPOSABLE) ×2 IMPLANT
SPONGE LAP 4X18 X RAY DECT (DISPOSABLE) ×2 IMPLANT
STRIP CLOSURE SKIN 1/2X4 (GAUZE/BANDAGES/DRESSINGS) ×2 IMPLANT
SUT BONE WAX W31G (SUTURE) ×2 IMPLANT
SUT MNCRL AB 4-0 PS2 18 (SUTURE) ×2 IMPLANT
SUT PROLENE 3 0 SH DA (SUTURE) IMPLANT
SUT PROLENE 3 0 SH1 36 (SUTURE) IMPLANT
SUT PROLENE 4 0 RB 1 (SUTURE)
SUT PROLENE 4 0 SH DA (SUTURE) IMPLANT
SUT PROLENE 4-0 RB1 .5 CRCL 36 (SUTURE) IMPLANT
SUT PROLENE 5 0 C 1 36 (SUTURE) IMPLANT
SUT PROLENE 6 0 C 1 30 (SUTURE) ×8 IMPLANT
SUT PROLENE 6 0 CC (SUTURE) IMPLANT
SUT PROLENE 7 0 BV 1 (SUTURE) IMPLANT
SUT PROLENE 7 0 BV1 MDA (SUTURE) ×2 IMPLANT
SUT PROLENE 7.0 RB 3 (SUTURE) ×2 IMPLANT
SUT PROLENE 8 0 BV175 6 (SUTURE) IMPLANT
SUT SILK  1 MH (SUTURE)
SUT SILK 1 MH (SUTURE) IMPLANT
SUT SILK 2 0 SH CR/8 (SUTURE) IMPLANT
SUT SILK 3 0 SH CR/8 (SUTURE) IMPLANT
SUT STEEL STERNAL CCS#1 18IN (SUTURE) IMPLANT
SUT STEEL SZ 6 DBL 3X14 BALL (SUTURE) IMPLANT
SUT VIC AB 1 CTX 36 (SUTURE) ×2
SUT VIC AB 1 CTX36XBRD ANBCTR (SUTURE) ×2 IMPLANT
SUT VIC AB 2-0 CT1 27 (SUTURE) ×1
SUT VIC AB 2-0 CT1 TAPERPNT 27 (SUTURE) ×1 IMPLANT
SUT VIC AB 2-0 CTX 27 (SUTURE) ×2 IMPLANT
SUT VIC AB 3-0 SH 27 (SUTURE)
SUT VIC AB 3-0 SH 27X BRD (SUTURE) IMPLANT
SUT VIC AB 3-0 X1 27 (SUTURE) IMPLANT
SUT VICRYL 4-0 PS2 18IN ABS (SUTURE) IMPLANT
SUTURE E-PAK OPEN HEART (SUTURE) ×2 IMPLANT
SYSTEM SAHARA CHEST DRAIN ATS (WOUND CARE) ×2 IMPLANT
TOWEL OR 17X24 6PK STRL BLUE (TOWEL DISPOSABLE) ×2 IMPLANT
TOWEL OR 17X26 10 PK STRL BLUE (TOWEL DISPOSABLE) ×2 IMPLANT
TRAY FOLEY IC TEMP SENS 14FR (CATHETERS) ×2 IMPLANT
TUBE SUCT INTRACARD DLP 20F (MISCELLANEOUS) ×2 IMPLANT
TUBING INSUFFLATION 10FT LAP (TUBING) ×2 IMPLANT
UNDERPAD 30X30 INCONTINENT (UNDERPADS AND DIAPERS) ×2 IMPLANT
WATER STERILE IRR 1000ML POUR (IV SOLUTION) ×4 IMPLANT

## 2011-09-07 NOTE — Progress Notes (Signed)
Patient ID: Felicia Rivas, female   DOB: [birth year], 55 y.o.   MRN: [id number] S/p CABG x 3 BP 130/70  Pulse 67  Temp(Src) 98.2 F (36.8 C) (Oral)  Resp 26  Ht 5\' 7"  (1.702 m)  Wt 134 kg (295 lb 6.7 oz)  BMI 46.27 kg/m2  SpO2 99% Extubated CT output minimal

## 2011-09-07 NOTE — Procedures (Signed)
Extubation Procedure Note  Patient Details:   Name: Felicia Rivas DOB: October 20, 1955 MRN: [id number]   Airway Documentation:   Pt extubated to 4L [place] at 1630. No stridor noted. BBS equal and dim. Pt coughing up sm white secretions. Pt able to vocalize name.  Evaluation  O2 sats: stable throughout and currently acceptable Complications: No apparent complications Patient did tolerate procedure well. Bilateral Breath Sounds: Diminished     Christie Beckers 09/07/2011, 4:42 PM

## 2011-09-07 NOTE — Transfer of Care (Signed)
Immediate Anesthesia Transfer of Care Note  Patient: Felicia Rivas  Procedure(s) Performed:  CORONARY ARTERY BYPASS GRAFTING (CABG)  Patient Location: PACU and ICU  Anesthesia Type: General  Level of Consciousness: sedated  Airway & Oxygen Therapy: Patient remains intubated per anesthesia plan and Patient placed on Ventilator (see vital sign flow sheet for setting)  Post-op Assessment: Report given to PACU RN  Post vital signs: Reviewed and stable  Complications: No apparent anesthesia complications

## 2011-09-07 NOTE — Brief Op Note (Signed)
08/31/2011 - 09/07/2011  11:46 AM  PATIENT:  Felicia Rivas  55 y.o. female  PRE-OPERATIVE DIAGNOSIS:  Coronary Artery Disease  POST-OPERATIVE DIAGNOSIS:  Coronary Artery Disease  PROCEDURE:  Procedure(s): CORONARY ARTERY BYPASS GRAFTING (CABG) X 3 (LIMA- LAD, SVG-OM2, SVG-PD), EVH Right leg  SURGEON:  Surgeon(s): Alleen Borne, MD  PHYSICIAN ASSISTANT: Coral Ceo, PA-C  ANESTHESIA:   general  PATIENT CONDITION:  ICU - intubated and hemodynamically stable.  PRE-OPERATIVE WEIGHT: 134 kg

## 2011-09-07 NOTE — Progress Notes (Signed)
Utilization Review Completed.Felicia Rivas T12/19/2012   

## 2011-09-07 NOTE — Preoperative (Signed)
Beta Blockers   Reason not to administer Beta Blockers:Not Applicable 

## 2011-09-07 NOTE — Anesthesia Postprocedure Evaluation (Signed)
  Anesthesia Post-op Note  Patient: Felicia Rivas  Procedure(s) Performed:  CORONARY ARTERY BYPASS GRAFTING (CABG)  Patient Location: PACU and SICU  Anesthesia Type: General  Level of Consciousness: sedated  Airway and Oxygen Therapy: Patient remains intubated per anesthesia plan and Patient placed on Ventilator (see vital sign flow sheet for setting)  Post-op Pain: none  Post-op Assessment: Post-op Vital signs reviewed, Patient's Cardiovascular Status Stable, Respiratory Function Stable, Patent Airway, No signs of Nausea or vomiting and Pain level controlled, remains on ventilator  Post-op Vital Signs: Reviewed and stable  Complications: No apparent anesthesia complications

## 2011-09-07 NOTE — Anesthesia Preprocedure Evaluation (Addendum)
Anesthesia Evaluation  Patient identified by MRN, date of birth, ID band Patient awake    Reviewed: Allergy & Precautions, H&P , NPO status , Patient's Chart, lab work & pertinent test results, reviewed documented beta blocker date and time   History of Anesthesia Complications (+) PONV  Airway Mallampati: II TM Distance: >3 FB Neck ROM: Limited  Mouth opening: Limited Mouth Opening  Dental  (+) Teeth Intact and Poor Dentition   Pulmonary asthma (inhaler use daily) , Current Smoker,  clear to auscultation  Pulmonary exam normal       Cardiovascular + Past MI (Cath 3 vessel disease) and +CHF (echo normal LVF, valves OK) Regular Normal    Neuro/Psych    GI/Hepatic   Endo/Other  Diabetes mellitus- (glu 133 this am), Type 2, Insulin DependentMorbid obesity  Renal/GU      Musculoskeletal   Abdominal (+) obese,   Peds  Hematology   Anesthesia Other Findings   Reproductive/Obstetrics                         Anesthesia Physical Anesthesia Plan  ASA: III  Anesthesia Plan: General   Post-op Pain Management:    Induction: Intravenous  Airway Management Planned: Oral ETT  Additional Equipment: Arterial line, CVP and PA Cath  Intra-op Plan:   Post-operative Plan: Post-operative intubation/ventilation  Informed Consent: I have reviewed the patients History and Physical, chart, labs and discussed the procedure including the risks, benefits and alternatives for the proposed anesthesia with the patient or authorized representative who has indicated his/her understanding and acceptance.   Dental advisory given  Plan Discussed with: CRNA, Anesthesiologist and Surgeon  Anesthesia Plan Comments: (Plan routine monitors, A line, PA cath, GETA with post op ventilation)       Anesthesia Quick Evaluation

## 2011-09-07 NOTE — Progress Notes (Signed)
   CARE MANAGEMENT NOTE 09/07/2011  Patient:  Felicia Rivas, Felicia Rivas   Account Number:  [id number]  Date Initiated:  09/06/2011  Documentation initiated by:  Onnie Boer  Subjective/Objective Assessment:   PT WAS ADMITTED WITH PNA     Action/Plan:   PROGRESSION OF CARE AND DISCHARGE PLANNING   Anticipated DC Date:  09/09/2011   Anticipated DC Plan:  HOME/SELF CARE      DC Planning Services  CM consult      Choice offered to / List presented to:             Status of service:  In process, will continue to follow Medicare Important Message given?   (If response is "NO", the following Medicare IM given date fields will be blank) Date Medicare IM given:   Date Additional Medicare IM given:    Discharge Disposition:    Per UR Regulation:  Reviewed for med. necessity/level of care/duration of stay  Comments:  09/06/2011 Onnie Boer, RN, BSN 1650 PT IS HAVING A CABG TODAY.  UR COMPLETED 09/05/2011 Onnie Boer, RN, BSN 1651 PT WAS ADMITTED WITH PNA, PTA PT WAS AT HOME WITH SELF CARE AND PLANS TO RETURN AT DC.

## 2011-09-07 NOTE — Plan of Care (Signed)
Problem: Phase II Progression Outcomes Goal: Patient extubated within - Outcome: Completed/Met Date Met:  09/07/11 Extubated within 6 hours at 1630pm

## 2011-09-08 ENCOUNTER — Encounter (HOSPITAL_COMMUNITY): Payer: Self-pay | Admitting: Surgery

## 2011-09-08 LAB — GLUCOSE, CAPILLARY
Glucose-Capillary: 106 mg/dL — ABNORMAL HIGH (ref 70–99)
Glucose-Capillary: 131 mg/dL — ABNORMAL HIGH (ref 70–99)
Glucose-Capillary: 81 mg/dL (ref 70–99)
Glucose-Capillary: 93 mg/dL (ref 70–99)

## 2011-09-08 LAB — BASIC METABOLIC PANEL
BUN: 18 mg/dL (ref 6–23)
CO2: 23 mEq/L (ref 19–32)
Chloride: 104 mEq/L (ref 96–112)
Creatinine, Ser: 0.68 mg/dL (ref 0.50–1.10)

## 2011-09-08 LAB — MAGNESIUM: Magnesium: 2.3 mg/dL (ref 1.5–2.5)

## 2011-09-08 LAB — POCT I-STAT, CHEM 8
BUN: 20 mg/dL (ref 6–23)
Chloride: 101 mEq/L (ref 96–112)
Creatinine, Ser: 0.9 mg/dL (ref 0.50–1.10)
Glucose, Bld: 169 mg/dL — ABNORMAL HIGH (ref 70–99)
Potassium: 4.8 mEq/L (ref 3.5–5.1)

## 2011-09-08 LAB — CBC
HCT: 35.7 % — ABNORMAL LOW (ref 36.0–46.0)
HCT: 35.2 % — ABNORMAL LOW (ref 36.0–46.0)
Hemoglobin: 11.5 g/dL — ABNORMAL LOW (ref 12.0–15.0)
MCH: 28.5 pg (ref 26.0–34.0)
MCV: 88.6 fL (ref 78.0–100.0)
RBC: 4.03 MIL/uL (ref 3.87–5.11)
RDW: 14.4 % (ref 11.5–15.5)
WBC: 21.9 10*3/uL — ABNORMAL HIGH (ref 4.0–10.5)
WBC: 23.5 10*3/uL — ABNORMAL HIGH (ref 4.0–10.5)

## 2011-09-08 MED ORDER — INSULIN GLARGINE 100 UNIT/ML ~~LOC~~ SOLN
20.0000 [IU] | SUBCUTANEOUS | Status: DC
  Administered 2011-09-08 – 2011-09-09 (×2): 20 [IU] via SUBCUTANEOUS
  Filled 2011-09-08 (×2): qty 3

## 2011-09-08 MED ORDER — METOPROLOL TARTRATE 25 MG PO TABS
25.0000 mg | ORAL_TABLET | Freq: Two times a day (BID) | ORAL | Status: DC
  Administered 2011-09-08 – 2011-09-11 (×7): 25 mg via ORAL
  Filled 2011-09-08 (×10): qty 1

## 2011-09-08 MED ORDER — POTASSIUM CHLORIDE CRYS ER 20 MEQ PO TBCR
40.0000 meq | EXTENDED_RELEASE_TABLET | Freq: Once | ORAL | Status: AC
  Administered 2011-09-08: 40 meq via ORAL
  Filled 2011-09-08: qty 2

## 2011-09-08 MED ORDER — METOPROLOL TARTRATE 25 MG/10 ML ORAL SUSPENSION
25.0000 mg | Freq: Two times a day (BID) | ORAL | Status: DC
  Administered 2011-09-10: 25 mg
  Filled 2011-09-08 (×10): qty 10

## 2011-09-08 MED ORDER — FUROSEMIDE 10 MG/ML IJ SOLN
40.0000 mg | Freq: Once | INTRAMUSCULAR | Status: AC
  Administered 2011-09-08: 40 mg via INTRAVENOUS
  Filled 2011-09-08: qty 4

## 2011-09-08 MED ORDER — INSULIN ASPART 100 UNIT/ML ~~LOC~~ SOLN
0.0000 [IU] | SUBCUTANEOUS | Status: DC
  Administered 2011-09-08 (×2): 2 [IU] via SUBCUTANEOUS
  Administered 2011-09-08: 4 [IU] via SUBCUTANEOUS
  Administered 2011-09-09 (×2): 2 [IU] via SUBCUTANEOUS

## 2011-09-08 MED ORDER — METOCLOPRAMIDE HCL 10 MG PO TABS
10.0000 mg | ORAL_TABLET | Freq: Three times a day (TID) | ORAL | Status: AC
  Administered 2011-09-08 – 2011-09-09 (×7): 10 mg via ORAL
  Filled 2011-09-08 (×8): qty 1

## 2011-09-08 MED ORDER — ENOXAPARIN SODIUM 40 MG/0.4ML ~~LOC~~ SOLN
40.0000 mg | Freq: Every day | SUBCUTANEOUS | Status: DC
  Administered 2011-09-08 – 2011-09-11 (×4): 40 mg via SUBCUTANEOUS
  Filled 2011-09-08 (×5): qty 0.4

## 2011-09-08 MED FILL — Magnesium Sulfate Inj 50%: INTRAMUSCULAR | Qty: 10 | Status: AC

## 2011-09-08 MED FILL — Potassium Chloride Inj 2 mEq/ML: INTRAVENOUS | Qty: 40 | Status: AC

## 2011-09-08 NOTE — Progress Notes (Signed)
1 Day Post-Op Procedure(s) (LRB): CORONARY ARTERY BYPASS GRAFTING (CABG) (N/A) Subjective: C/o numbness, tingling in both hands. Otherwise, no complaints  Objective: Vital signs in last 24 hours: Temp:  [96.6 F (35.9 C)-98.8 F (37.1 C)] 98.8 F (37.1 C) (12/20 0600) Pulse Rate:  [67-85] 85  (12/20 0600) Cardiac Rhythm:  [-] Normal sinus rhythm (12/20 0600) Resp:  [2-26] 25  (12/20 0600) BP: (101-133)/(53-72) 133/68 mmHg (12/20 0600) SpO2:  [95 %-100 %] 100 % (12/20 0600) Arterial Line BP: (89-149)/(52-75) 147/69 mmHg (12/20 0600) FiO2 (%):  [40 %-50 %] 40 % (12/19 1603) Weight:  [139.8 kg (308 lb 3.3 oz)] 308 lb 3.3 oz (139.8 kg) (12/20 0600)  Hemodynamic parameters for last 24 hours: PAP: (24-42)/(11-24) 42/15 mmHg CO:  [3.9 L/min-6.8 L/min] 6.8 L/min CI:  [1.6 L/min/m2-2.8 L/min/m2] 2.8 L/min/m2  Intake/Output from previous day: 12/19 0701 - 12/20 0700 In: 4322.4 [I.V.:3329.4; Blood:283; NG/GT:30; IV Piggyback:680] Out: 2940 [Urine:2120; Emesis/NG output:70; Blood:460; Chest Tube:290] Intake/Output this shift:    General appearance: alert and cooperative Neurologic: mildly weak grip on both sides Lungs: clear Heart:  RRR Ext:  Mild edema  Lab Results:  Basename 09/08/11 0355 09/07/11 2003 09/07/11 1945  WBC 21.9* -- 25.1*  HGB 11.5* 11.6* --  HCT 35.7* 34.0* --  PLT 187 -- 184   BMET:  Basename 09/08/11 0355 09/07/11 2003 09/07/11 0625  NA 135 140 --  K 4.3 4.2 --  CL 104 106 --  CO2 23 -- 33*  GLUCOSE 103* 129* --  BUN 18 17 --  CREATININE 0.68 0.70 --  CALCIUM 8.5 -- 9.8    PT/INR:  Basename 09/07/11 1345  LABPROT 15.1  INR 1.17   ABG    Component Value Date/Time   PHART 7.367 09/07/2011 1748   HCO3 23.9 09/07/2011 1748   TCO2 25 09/07/2011 2003   ACIDBASEDEF 2.0 09/07/2011 1748   O2SAT 92.0 09/07/2011 1748   CBG (last 3)   Basename 09/08/11 0726 09/08/11 0312 09/08/11 0028  GLUCAP 81 93 106*    Assessment/Plan: S/P Procedure(s)  (LRB): CORONARY ARTERY BYPASS GRAFTING (CABG) (N/A) Mobilize Diuresis Diabetes control d/c tubes/lines See progression orders   LOS: 8 days    Felicia Rivas K 09/08/2011

## 2011-09-08 NOTE — Progress Notes (Signed)
Patient ID: Felicia Rivas, female   DOB: May 21, 1956, 55 y.o.   MRN: [id number]                   [address]            [place] [address]          [phone]      Belina Mandile [facility] Medical Record #[id number] Date of Birth: Mar 23, 1956  Referring: No ref. provider found Primary Care: No primary provider on file.  Chief Complaint:   POST OP FOLLOW UP  History of Present Illness:     Doing well post op, walked in unit, not SOB as was preop     Past Medical History  Diagnosis Date  . Asthma   . Diabetes mellitus   . PONV (postoperative nausea and vomiting)   . Sleep disturbance     Had sleep study several years ago, wasn't told she has OSA but told she needs O2 at night  . Abnormal stress test     Failed stress test 3 years ago in [facility] for cough -  had f/u echo showing a "valve didn't open well" . No cath  . Myocardial infarction      History  Smoking status  . Current Everyday Smoker -- 0.5 packs/day  . Types: Cigarettes  Smokeless tobacco  . Never Used    History  Alcohol Use No     No Known Allergies  Current Facility-Administered Medications  Medication Dose Route Frequency Provider Last Rate Last Dose  . 0.45 % sodium chloride infusion   Intravenous Continuous Adella Hare, PA 20 mL/hr at 09/08/11 2841    . 0.9 %  sodium chloride infusion   Intravenous Continuous Adella Hare, PA      . 0.9 %  sodium chloride infusion  250 mL Intravenous Continuous Adella Hare, PA      . acetaminophen (TYLENOL) tablet 1,000 mg  1,000 mg Oral Q6H Adella Hare, PA   1,000 mg at 09/08/11 1715   Or  . acetaminophen (TYLENOL) solution 975 mg  975 mg Per Tube Q6H Gina H Collins, PA      . albumin human 5 % solution 250 mL  250 mL Intravenous Q15 min PRN Adella Hare, PA   250 mL at 09/07/11 1350  . aspirin EC tablet 325 mg  325 mg Oral Daily Adella Hare, PA   325 mg at 09/08/11 1025   Or  . aspirin chewable tablet 324 mg  324 mg Per Tube  Daily Adella Hare, PA      . bisacodyl (DULCOLAX) EC tablet 10 mg  10 mg Oral Daily Adella Hare, PA   10 mg at 09/08/11 1050   Or  . bisacodyl (DULCOLAX) suppository 10 mg  10 mg Rectal Daily Adella Hare, PA      . cefUROXime (ZINACEF) 1.5 g in dextrose 5 % 50 mL IVPB  1.5 g Intravenous Q12H Adella Hare, PA   1.5 g at 09/08/11 0932  . dexmedetomidine (PRECEDEX) 200 mcg in sodium chloride 0.9 % 50 mL infusion  0.1-0.7 mcg/kg/hr Intravenous Continuous Adella Hare, PA   0.2 mcg/kg/hr at 09/07/11 1500  . docusate sodium (COLACE) capsule 200 mg  200 mg Oral Daily Adella Hare, PA   200 mg at 09/08/11 1049  . enoxaparin (LOVENOX) injection 40 mg  40 mg Subcutaneous QHS Alleen Borne, MD      .  eptifibatide (INTEGRILIN) 75 mg / 100 mL infusion  2 mcg/kg/min Intravenous To Cath Judene Companion, MD      . famotidine (PEPCID) IVPB 20 mg  20 mg Intravenous Q12H Adella Hare, PA   20 mg at 09/07/11 1355  . furosemide (LASIX) injection 40 mg  40 mg Intravenous Once Alleen Borne, MD   40 mg at 09/08/11 0904  . insulin aspart (novoLOG) injection 0-24 Units  0-24 Units Subcutaneous Q2H Alleen Borne, MD   2 Units at 09/07/11 1855  . insulin aspart (novoLOG) injection 0-24 Units  0-24 Units Subcutaneous Q4H Alleen Borne, MD   2 Units at 09/08/11 1527  . insulin glargine (LANTUS) injection 20 Units  20 Units Subcutaneous Q0700 Alleen Borne, MD   20 Units at 09/08/11 1141  . insulin regular (NOVOLIN R,HUMULIN R) 1 Units/mL in sodium chloride 0.9 % 100 mL infusion   Intravenous Continuous Adella Hare, PA      . lactated ringers infusion 500 mL  500 mL Intravenous Once PRN Adella Hare, PA      . lactated ringers infusion   Intravenous Continuous Adella Hare, PA 20 mL/hr at 09/08/11 0600    . magnesium sulfate IVPB 4 g 100 mL  4 g Intravenous Once Adella Hare, PA   4 g at 09/07/11 1435  . metoCLOPramide (REGLAN) tablet 10 mg  10 mg Oral TID AC & HS Alleen Borne, MD   10 mg at  09/08/11 1716  . metoprolol (LOPRESSOR) injection 2.5-5 mg  2.5-5 mg Intravenous Q2H PRN Adella Hare, PA      . metoprolol tartrate (LOPRESSOR) tablet 25 mg  25 mg Oral BID Alleen Borne, MD   25 mg at 09/08/11 1026   Or  . metoprolol tartrate (LOPRESSOR) 25 mg/10 mL oral suspension 25 mg  25 mg Per Tube BID Alleen Borne, MD      . midazolam (VERSED) injection 2 mg  2 mg Intravenous Q1H PRN Adella Hare, PA      . morphine 2 MG/ML injection 1-4 mg  1-4 mg Intravenous Q1H PRN Adella Hare, PA   2 mg at 09/07/11 2043  . morphine 4 MG/ML injection 2-5 mg  2-5 mg Intravenous Q1H PRN Adella Hare, PA   4 mg at 09/08/11 1519  . nitroGLYCERIN 0.2 mg/mL in dextrose 5 % infusion  0-100 mcg/min Intravenous Continuous Adella Hare, PA   10 mcg/min at 09/07/11 1353  . ondansetron (ZOFRAN) injection 4 mg  4 mg Intravenous Q6H PRN Adella Hare, PA   4 mg at 09/08/11 1211  . oxyCODONE (Oxy IR/ROXICODONE) immediate release tablet 5-10 mg  5-10 mg Oral Q3H PRN Adella Hare, PA   10 mg at 09/08/11 1325  . pantoprazole (PROTONIX) EC tablet 40 mg  40 mg Oral Q1200 Adella Hare, PA      . phenylephrine (NEO-SYNEPHRINE) 20,000 mcg in dextrose 5 % 250 mL infusion  0-100 mcg/min Intravenous Continuous Adella Hare, PA   2 mcg/min at 09/07/11 1500  . potassium chloride SA (K-DUR,KLOR-CON) CR tablet 40 mEq  40 mEq Oral Once Alleen Borne, MD   40 mEq at 09/08/11 1027  . rosuvastatin (CRESTOR) tablet 40 mg  40 mg Oral Q0600 Lewayne Bunting, MD   40 mg at 09/08/11 1716  . sodium chloride 0.9 % injection 3 mL  3 mL Intravenous Q12H Adella Hare,  PA   3 mL at 09/08/11 1232  . sodium chloride 0.9 % injection 3 mL  3 mL Intravenous PRN Adella Hare, PA      . vancomycin (VANCOCIN) 1,000 mg in sodium chloride 0.9 % 100 mL IVPB  1,000 mg Intravenous Once Adella Hare, PA   1,000 mg at 09/07/11 2040  . DISCONTD: insulin aspart (novoLOG) injection 0-24 Units  0-24 Units Subcutaneous Q4H Alleen Borne,  MD   2 Units at 09/08/11 1202  . DISCONTD: metoprolol tartrate (LOPRESSOR) 25 mg/10 mL oral suspension 12.5 mg  12.5 mg Per Tube BID Adella Hare, PA      . DISCONTD: metoprolol tartrate (LOPRESSOR) tablet 12.5 mg  12.5 mg Oral BID Adella Hare, PA           Physical Exam: BP 134/105  Pulse 70  Temp(Src) 98.3 F (36.8 C) (Oral)  Resp 18  Ht 5\' 7"  (1.702 m)  Wt 308 lb 3.3 oz (139.8 kg)  BMI 48.27 kg/m2  SpO2 97% Neuro intact Not wheezing     Diagnostic Studies & Laboratory data:     Recent Radiology Findings:   Dg Chest Portable 1 View In Am  09/07/2011  *RADIOLOGY REPORT*  Clinical Data: Postop heart surgery.  PORTABLE CHEST - 1 VIEW  Comparison: 09/03/2011.  Findings: Endotracheal tube is in satisfactory position. Nasogastric tube is followed into the distal esophagus, with the tip projecting beyond the inferior boundary of the film.  Right IJ Swan-Ganz catheter tip projects over the right pulmonary artery. Mediastinal drain and left chest tube are in place.  Seven intact sternotomy wires are visualized.  Cardiomediastinal silhouette is slightly prominent, as expected postoperatively.  There is central pulmonary vascular congestion with scattered atelectasis bilaterally.  No definite pleural fluid or pneumothorax.  IMPRESSION: Central pulmonary vascular congestion with scattered atelectasis.  Original Report Authenticated By: Reyes Ivan, M.D.      Recent Lab Findings: Lab Results  Component Value Date   WBC 23.5* 09/08/2011   HGB 11.9* 09/08/2011   HCT 35.0* 09/08/2011   PLT 183 09/08/2011   GLUCOSE 169* 09/08/2011   ALT 34 09/02/2011   AST 20 09/02/2011   NA 135 09/08/2011   K 4.8 09/08/2011   CL 101 09/08/2011   CREATININE 0.90 09/08/2011   BUN 20 09/08/2011   CO2 23 09/08/2011   TSH 1.017 09/01/2011   INR 1.17 09/07/2011   HGBA1C 8.1* 09/01/2011      Assessment / Plan:      Stable day one post op      Delight Ovens MD  Beeper  [phone] Office [phone] 09/08/2011 7:01 PM

## 2011-09-08 NOTE — Op Note (Signed)
NAMEMarland Rivas  [name], [name] NO.:  [id number]  MEDICAL RECORD NO.:  [id number]  LOCATION:  2302                         FACILITY:  [facility]  PHYSICIAN:  Evelene Croon, M.D.     DATE OF BIRTH:  1955/12/26  DATE OF PROCEDURE:  09/07/2011 DATE OF DISCHARGE:                              OPERATIVE REPORT   PREOPERATIVE DIAGNOSIS:  Severe three-vessel coronary artery disease.  POSTOPERATIVE DIAGNOSIS:  Severe three-vessel coronary artery disease.  OPERATIVE PROCEDURE:  Median sternotomy, extracorporeal circulation, coronary artery bypass graft surgery x3 using a left internal mammary artery graft to the left anterior descending coronary artery, with a saphenous vein graft to the second obtuse marginal branch of the left circumflex coronary artery, and a saphenous vein graft to the posterior descending branch of the right coronary artery.  Endoscopic vein harvesting from the right leg.  ATTENDING SURGEON:  Evelene Croon, MD  ASSISTANT:  Coral Ceo, PA-C  ANESTHESIA:  General endotracheal.  CLINICAL HISTORY:  This patient is a 55 year old morbidly obese, diabetic woman with asthma who presented with about 2 month history of episodic dyspnea occurring with exertion and at rest.  About a week and half prior to admission, the patient presented to the [facility] in [facility] with shortness of breath and was told that she had pneumonia.  She was treated with a 5 day course of antibiotics and told to follow up with her primary physician, and seemed to be improved when she went to see her primary physician although she said she still did not feel right.  Then last Wednesday she was driving home from work and suddenly became short of breath, and that was much more severe than usual.  She pulled over to a gas station and 911 was called.  She was brought to the [facility] Emergency Room with hypoxemia and respiratory distress.  She was treated with BiPAP and diuresis with  improvement.  CT scan showed no evidence of pulmonary embolism, but did show patchy airspace disease bilaterally consistent with edema.  Cardiac enzymes were positive. Echocardiogram showed ejection fraction of 55-60% with a mildly dilated left ventricle and moderate concentric left ventricular hypertrophy. There was no significant valvular disease.  Cardiac catheterizations showed significant three-vessel coronary disease.  The LAD had a 70-80% stenosis just beyond the origin of the major diagonal branch.  The left circumflex had a moderate-sized first marginal and had some mild ostial irregularities but no stenosis.  The AV groove portion of the circumflex provides a second marginal branch that had a high-grade stenosis proximally about 80%.  The right coronary artery had about 40% segmental plaque near the junction of the proximal and mid vessel.  There was about 80% stenosis just prior to the takeoff of the posterior descending branch.  Left ventricular end-diastolic pressure was elevated at 48. The patient was treated with further diuresis and continued to improve. After review of the catheterization and examination of the patient, it was felt that coronary artery bypass graft surgery is the best treatment to prevent further ischemia and infarction.  I discussed the operative procedure with the patient and her daughter.  We discussed the alternatives, benefits, and risks including,  but not limited to bleeding, blood transfusion, infection, stroke, myocardial infarction, graft failure, and death.  We also discussed the importance of maximum cardiac risk factor reduction.  She understood and agreed to proceed.  OPERATIVE PROCEDURE:  The patient was taken to the operating room and placed on the table in supine position.  After induction of general endotracheal anesthesia, a Foley catheter was placed in the bladder using sterile technique.  Then, the chest, abdomen, and both  lower extremities were prepped and draped in usual sterile manner.  The chest was entered through a median sternotomy incision and the pericardium opened in the midline.  Examination of the heart showed good ventricular contractility.  The ascending aorta was of normal size and had no palpable plaques in it.  Then, the left internal mammary artery was harvested from the chest wall as a pedicle graft.  This was a medium caliber vessel with excellent blood flow through it.  At the same time, a segment of greater saphenous vein was harvested from the right leg using endoscopic vein harvest technique.  This vein was of medium size and good quality.  Then, the patient was heparinized and when an adequate ACT was obtained, the distal ascending aorta was cannulated using a 20-French aortic cannula for arterial inflow.  Venous outflow was achieved using a two- stage venous cannula for the right atrial appendage.  An antegrade cardioplegia and vent cannula were inserted in the aortic root.  The patient was placed on cardiopulmonary bypass and distal coronaries identified.  The LAD was intramyocardial along most of its extent.  It exited near the apex of the heart.  I located this within the muscle in the midportion where it was a moderate size graftable vessel.  The obtuse marginal branch was visible in its proximal portion just beyond the area of stenosis and was a large graftable vessel.  It then became intramyocardial.  The posterior descending was located in its proximal portion and was a moderate size graftable vessel.  Then it became intramyocardial and was lying deep to a large posterior descending vein.  Then, the aorta was crossclamped and 1000 mL of cold blood antegrade cardioplegia was administered in the aortic root with quick arrest of the heart.  Systemic hypothermia to 32 degrees centigrade and topical hypothermic iced saline was used.  A temperature probe was placed in  the septum and an insulating pad in the pericardium.  First, distal anastomosis was performed of the obtuse marginal branch. The internal diameter of this vessel was about 1.75 mm.  The conduit used was a segment of greater saphenous vein and the anastomosis performed in an end-to-side manner using continuous 7-0 Prolene suture. Flow was noted through the graft and was excellent.  The second distal anastomosis was performed to the posterior descending coronary artery.  The internal diameter of this vessel was about 1.75 mm proximally.  The conduit used was the second segment of greater saphenous vein and the anastomosis performed in an end-to-side manner using continuous 7-0 Prolene suture.  Flow was noted through the graft and was excellent.  Then, another dose of cardioplegia was given.  The third distal anastomosis was performed to the mid LAD.  The internal diameter of this vessel was 1.75 mm.  Conduit used was a left internal mammary graft and was brought through an opening in the left pericardium and anterior to the phrenic nerve.  It was anastomosed to the LAD in end-to-side manner using continuous 8-0 Prolene suture.  The  pedicle was sutured to the epicardium with 6-0 Prolene sutures.  Then, the patient was rewarmed to 37 degrees centigrade.  With crossclamp in place, the 2 proximal vein graft anastomoses were performed in the mid ascending aorta in an end-to- side manner using continuous 6-0 Prolene suture.  Then, the clamp was removed from mammary pedicle.  There was rapid warming of the ventricular septum and return of spontaneous ventricular fibrillation. Crossclamp was removed with time of 66 minutes, and the patient spontaneously converted to sinus rhythm.  The proximal and distal anastomoses appeared hemostatic and allowed the grafts satisfactory. Graft markers were placed around proximal anastomoses.  Two temporary right ventricular and right atrial pacing wires were  placed and brought out through the skin.  When the patient had rewarmed to 37 degrees centigrade, she was weaned from cardiopulmonary bypass on no inotropic agents.  Total bypass time was 83 minutes.  Cardiac function appeared excellent with a cardiac output of 5.5 L/minute.  Protamine was given and the venous and aortic cannulae were removed without difficulty.  Hemostasis was achieved. Three chest tubes were placed with a tube in the posterior pericardium, 1 in left pleural space, and 1 in the anterior mediastinum.  Sternum was then closed with double #6 stainless steel wires.  The fascia was closed with continuous #1 Vicryl suture.  Subcutaneous tissue was closed with continuous 2-0 Vicryl and the skin with a 3-0 Vicryl subcuticular closure.  Lower extremity vein harvest site was closed in layers in similar manner.  The sponge, needle, and instrument counts were correct according to scrub nurse.  Dry sterile dressings were applied over the incisions around chest tubes, which were hooked to Pleur-Evac suction. The patient remained hemodynamically stable and was transported to the SICU in guarded but stable condition.     Evelene Croon, M.D.     BB/MEDQ  D:  09/07/2011  T:  09/08/2011  Job:  [id number]

## 2011-09-09 ENCOUNTER — Inpatient Hospital Stay (HOSPITAL_COMMUNITY): Payer: BC Managed Care – PPO

## 2011-09-09 LAB — GLUCOSE, CAPILLARY
Glucose-Capillary: 142 mg/dL — ABNORMAL HIGH (ref 70–99)
Glucose-Capillary: 146 mg/dL — ABNORMAL HIGH (ref 70–99)
Glucose-Capillary: 201 mg/dL — ABNORMAL HIGH (ref 70–99)

## 2011-09-09 LAB — CBC
MCH: 29.5 pg (ref 26.0–34.0)
MCHC: 32.8 g/dL (ref 30.0–36.0)
MCV: 90 fL (ref 78.0–100.0)
Platelets: 161 10*3/uL (ref 150–400)
RDW: 14.2 % (ref 11.5–15.5)

## 2011-09-09 LAB — BASIC METABOLIC PANEL
CO2: 26 mEq/L (ref 19–32)
Calcium: 8.7 mg/dL (ref 8.4–10.5)
Creatinine, Ser: 0.68 mg/dL (ref 0.50–1.10)
GFR calc non Af Amer: >90 mL/min (ref >90)
Glucose, Bld: 137 mg/dL — ABNORMAL HIGH (ref 70–99)
Sodium: 134 mEq/L — ABNORMAL LOW (ref 135–145)

## 2011-09-09 MED ORDER — INSULIN ASPART 100 UNIT/ML ~~LOC~~ SOLN
0.0000 [IU] | Freq: Three times a day (TID) | SUBCUTANEOUS | Status: DC
  Administered 2011-09-09: 4 [IU] via SUBCUTANEOUS
  Administered 2011-09-09 – 2011-09-10 (×3): 7 [IU] via SUBCUTANEOUS
  Administered 2011-09-10: 4 [IU] via SUBCUTANEOUS
  Administered 2011-09-11: 7 [IU] via SUBCUTANEOUS
  Administered 2011-09-11: 4 [IU] via SUBCUTANEOUS
  Administered 2011-09-11: 7 [IU] via SUBCUTANEOUS

## 2011-09-09 MED ORDER — ONDANSETRON HCL 4 MG PO TABS: 4.0000 mg | ORAL_TABLET | Freq: Four times a day (QID) | ORAL | Status: DC | PRN

## 2011-09-09 MED ORDER — ZOLPIDEM TARTRATE 5 MG PO TABS: 5.0000 mg | ORAL_TABLET | Freq: Every evening | ORAL | Status: DC | PRN

## 2011-09-09 MED ORDER — INSULIN GLARGINE 100 UNIT/ML ~~LOC~~ SOLN
50.0000 [IU] | SUBCUTANEOUS | Status: DC
Start: 2011-09-10 — End: 2011-09-10
  Administered 2011-09-10: 50 [IU] via SUBCUTANEOUS

## 2011-09-09 MED ORDER — BUDESONIDE-FORMOTEROL FUMARATE 160-4.5 MCG/ACT IN AERO
2.0000 | INHALATION_SPRAY | Freq: Two times a day (BID) | RESPIRATORY_TRACT | Status: DC
  Administered 2011-09-09 – 2011-09-11 (×5): 2 via RESPIRATORY_TRACT
  Filled 2011-09-09: qty 6

## 2011-09-09 MED ORDER — ALPRAZOLAM 0.25 MG PO TABS: 0.2500 mg | ORAL_TABLET | Freq: Four times a day (QID) | ORAL | Status: DC | PRN

## 2011-09-09 MED ORDER — TRAMADOL HCL 50 MG PO TABS: 50.0000 mg | ORAL_TABLET | ORAL | Status: DC | PRN

## 2011-09-09 MED ORDER — SODIUM CHLORIDE 0.9 % IJ SOLN
3.0000 mL | Freq: Two times a day (BID) | INTRAMUSCULAR | Status: DC
  Administered 2011-09-09 – 2011-09-11 (×5): 3 mL via INTRAVENOUS

## 2011-09-09 MED ORDER — ONDANSETRON HCL 4 MG/2ML IJ SOLN
4.0000 mg | Freq: Four times a day (QID) | INTRAMUSCULAR | Status: DC | PRN
  Administered 2011-09-11: 4 mg via INTRAVENOUS
  Filled 2011-09-09: qty 2

## 2011-09-09 MED ORDER — POVIDONE-IODINE 10 % EX SOLN
1.0000 "application " | Freq: Two times a day (BID) | CUTANEOUS | Status: DC
  Administered 2011-09-09 – 2011-09-11 (×5): 1 via TOPICAL
  Filled 2011-09-09: qty 15

## 2011-09-09 MED ORDER — INSULIN ASPART 100 UNIT/ML ~~LOC~~ SOLN
8.0000 [IU] | Freq: Three times a day (TID) | SUBCUTANEOUS | Status: DC
  Administered 2011-09-10: 8 [IU] via SUBCUTANEOUS

## 2011-09-09 MED ORDER — INSULIN ASPART 100 UNIT/ML ~~LOC~~ SOLN
0.0000 [IU] | Freq: Every day | SUBCUTANEOUS | Status: DC
  Administered 2011-09-09: 2 [IU] via SUBCUTANEOUS

## 2011-09-09 MED ORDER — SODIUM CHLORIDE 0.9 % IJ SOLN: 3.0000 mL | INTRAMUSCULAR | Status: DC | PRN

## 2011-09-09 MED ORDER — ALUM & MAG HYDROXIDE-SIMETH 400-400-40 MG/5ML PO SUSP
15.0000 mL | ORAL | Status: DC | PRN
  Filled 2011-09-09: qty 30

## 2011-09-09 MED ORDER — SODIUM CHLORIDE 0.9 % IV SOLN: 250.0000 mL | INTRAVENOUS | Status: DC | PRN

## 2011-09-09 MED ORDER — POTASSIUM CHLORIDE CRYS ER 20 MEQ PO TBCR
20.0000 meq | EXTENDED_RELEASE_TABLET | Freq: Two times a day (BID) | ORAL | Status: DC
  Administered 2011-09-09: 20 meq via ORAL
  Filled 2011-09-09 (×3): qty 1

## 2011-09-09 MED ORDER — ALBUTEROL SULFATE (5 MG/ML) 0.5% IN NEBU: 2.5000 mg | INHALATION_SOLUTION | Freq: Four times a day (QID) | RESPIRATORY_TRACT | Status: DC | PRN

## 2011-09-09 MED ORDER — MOVING RIGHT ALONG BOOK
Freq: Once | Status: AC
  Administered 2011-09-09: 16:00:00
  Filled 2011-09-09: qty 1

## 2011-09-09 MED FILL — Sodium Chloride IV Soln 0.9%: INTRAVENOUS | Qty: 1000 | Status: AC

## 2011-09-09 MED FILL — Heparin Sodium (Porcine) Inj 1000 Unit/ML: INTRAMUSCULAR | Qty: 90 | Status: AC

## 2011-09-09 MED FILL — Electrolyte-R (PH 7.4) Solution: INTRAVENOUS | Qty: 7000 | Status: AC

## 2011-09-09 MED FILL — Sodium Chloride Irrigation Soln 0.9%: Qty: 3000 | Status: AC

## 2011-09-09 NOTE — Progress Notes (Signed)
Patient was transferred to 2000, room 2022, at 1415 on 09/09/11.  Transferred by wheelchair on 2L [place].  All personal belongings were transferred with patient.  Report was given to admitting nurse prior to arrival to 2000.   Larina Bras, RN BSN

## 2011-09-09 NOTE — Progress Notes (Signed)
2 Days Post-Op Procedure(s) (LRB): CORONARY ARTERY BYPASS GRAFTING (CABG) (N/A) Subjective: No c/o this AM, denies pain and nausea  Objective: Vital signs in last 24 hours: Temp:  [98.3 F (36.8 C)-99.3 F (37.4 C)] 99.3 F (37.4 C) (12/21 0739) Pulse Rate:  [65-85] 74  (12/21 0700) Cardiac Rhythm:  [-] Normal sinus rhythm (12/21 0600) Resp:  [14-25] 18  (12/21 0700) BP: (109-139)/(39-105) 124/45 mmHg (12/21 0700) SpO2:  [94 %-99 %] 97 % (12/21 0700) Arterial Line BP: (129-130)/(72-91) 130/91 mmHg (12/20 1000) FiO2 (%):  [2 %] 2 % (12/20 1600) Weight:  [139.3 kg (307 lb 1.6 oz)] 307 lb 1.6 oz (139.3 kg) (12/21 0400)  Hemodynamic parameters for last 24 hours: PAP: (42-46)/(15-20) 46/20 mmHg  Intake/Output from previous day: 12/20 0701 - 12/21 0700 In: 1610 [P.O.:1040; I.V.:520; IV Piggyback:50] Out: 1710 [Urine:1660; Chest Tube:50] Intake/Output this shift:    General appearance: alert and no distress Heart: regular rate and rhythm Lungs: diminished breath sounds bilaterally Abdomen: normal findings: soft, non-tender  Lab Results:  Basename 09/09/11 0400 09/08/11 1702 09/08/11 1700  WBC 17.3* -- 23.5*  HGB 10.6* 11.9* --  HCT 32.3* 35.0* --  PLT 161 -- 183   BMET:  Basename 09/09/11 0400 09/08/11 1702 09/08/11 0355  NA 134* 135 --  K 4.5 4.8 --  CL 100 101 --  CO2 26 -- 23  GLUCOSE 137* 169* --  BUN 17 20 --  CREATININE 0.68 0.90 --  CALCIUM 8.7 -- 8.5    PT/INR:  Basename 09/07/11 1345  LABPROT 15.1  INR 1.17   ABG    Component Value Date/Time   PHART 7.367 09/07/2011 1748   HCO3 23.9 09/07/2011 1748   TCO2 26 09/08/2011 1702   ACIDBASEDEF 2.0 09/07/2011 1748   O2SAT 92.0 09/07/2011 1748   CBG (last 3)   Basename 09/09/11 0722 09/09/11 0314 09/08/11 2332  GLUCAP 157* 146* 142*    Assessment/Plan: S/P Procedure(s) (LRB): CORONARY ARTERY BYPASS GRAFTING (CABG) (N/A) Mobilize Diuresis Diabetes control Plan for transfer to step-down: see  transfer orders Looks great Transfer to 2000   LOS: 9 days    Shruthi Northrup C 09/09/2011

## 2011-09-10 LAB — BASIC METABOLIC PANEL
BUN: 14 mg/dL (ref 6–23)
Creatinine, Ser: 0.58 mg/dL (ref 0.50–1.10)
GFR calc Af Amer: >90 mL/min (ref >90)
GFR calc non Af Amer: >90 mL/min (ref >90)
Glucose, Bld: 211 mg/dL — ABNORMAL HIGH (ref 70–99)
Potassium: 4.2 mEq/L (ref 3.5–5.1)

## 2011-09-10 LAB — CBC
HCT: 32.2 % — ABNORMAL LOW (ref 36.0–46.0)
Hemoglobin: 10.4 g/dL — ABNORMAL LOW (ref 12.0–15.0)
MCH: 28.9 pg (ref 26.0–34.0)
MCHC: 32.3 g/dL (ref 30.0–36.0)
MCV: 89.4 fL (ref 78.0–100.0)
RDW: 14.3 % (ref 11.5–15.5)

## 2011-09-10 LAB — GLUCOSE, CAPILLARY: Glucose-Capillary: 219 mg/dL — ABNORMAL HIGH (ref 70–99)

## 2011-09-10 MED ORDER — INSULIN GLARGINE 100 UNIT/ML ~~LOC~~ SOLN: 60.0000 [IU] | SUBCUTANEOUS | Status: DC

## 2011-09-10 MED ORDER — INSULIN GLARGINE 100 UNIT/ML ~~LOC~~ SOLN
60.0000 [IU] | Freq: Two times a day (BID) | SUBCUTANEOUS | Status: DC
  Administered 2011-09-10: 60 [IU] via SUBCUTANEOUS

## 2011-09-10 MED ORDER — POTASSIUM CHLORIDE CRYS ER 20 MEQ PO TBCR
20.0000 meq | EXTENDED_RELEASE_TABLET | Freq: Every day | ORAL | Status: DC
  Administered 2011-09-10 – 2011-09-11 (×2): 20 meq via ORAL
  Filled 2011-09-10 (×2): qty 1

## 2011-09-10 MED ORDER — FUROSEMIDE 40 MG PO TABS
40.0000 mg | ORAL_TABLET | Freq: Every day | ORAL | Status: DC
  Administered 2011-09-10 – 2011-09-11 (×2): 40 mg via ORAL
  Filled 2011-09-10 (×3): qty 1

## 2011-09-10 MED ORDER — INSULIN ASPART 100 UNIT/ML ~~LOC~~ SOLN
12.0000 [IU] | Freq: Three times a day (TID) | SUBCUTANEOUS | Status: DC
  Administered 2011-09-10 – 2011-09-11 (×3): 12 [IU] via SUBCUTANEOUS

## 2011-09-10 NOTE — Progress Notes (Signed)
Subjective:  Patient is doing well. Rhythm remains NSR  Objective:  Vital Signs in the last 24 hours: Temp:  [97.3 F (36.3 C)-98.9 F (37.2 C)] 97.9 F (36.6 C) (12/22 0538) Pulse Rate:  [70-90] 71  (12/22 0538) Resp:  [19-23] 20  (12/22 0538) BP: (95-146)/(50-74) 132/66 mmHg (12/22 0538) SpO2:  [94 %-100 %] 98 % (12/22 0538) FiO2 (%):  [0 %] 0 % (12/21 1437) Weight:  [306 lb 3.5 oz (138.9 kg)] 306 lb 3.5 oz (138.9 kg) (12/22 0538)  Intake/Output from previous day: 12/21 0701 - 12/22 0700 In: 760 [P.O.:720; I.V.:40] Out: 225 [Urine:225] Intake/Output from this shift:       . acetaminophen  1,000 mg Oral Q6H   Or  . acetaminophen (TYLENOL) oral liquid 160 mg/5 mL  975 mg Per Tube Q6H  . aspirin EC  325 mg Oral Daily   Or  . aspirin  324 mg Per Tube Daily  . bisacodyl  10 mg Oral Daily   Or  . bisacodyl  10 mg Rectal Daily  . budesonide-formoterol  2 puff Inhalation BID  . cefUROXime (ZINACEF)  IV  1.5 g Intravenous Q12H  . docusate sodium  200 mg Oral Daily  . enoxaparin  40 mg Subcutaneous QHS  . insulin aspart  0-20 Units Subcutaneous TID WC  . insulin aspart  0-5 Units Subcutaneous QHS  . insulin aspart  8 Units Subcutaneous TID WC  . insulin glargine  50 Units Subcutaneous Q0700  . metoCLOPramide  10 mg Oral TID AC & HS  . metoprolol tartrate  25 mg Oral BID   Or  . metoprolol tartrate  25 mg Per Tube BID  . moving right along book   Does not apply Once  . pantoprazole  40 mg Oral Q1200  . potassium chloride  20 mEq Oral BID  . povidone-iodine  1 application Topical BID  . rosuvastatin  40 mg Oral Q0600  . sodium chloride  3 mL Intravenous Q12H  . DISCONTD: sodium chloride  3 mL Intravenous Q12H      . DISCONTD: sodium chloride 20 mL/hr at 09/08/11 0933  . DISCONTD: sodium chloride    . DISCONTD: sodium chloride    . DISCONTD: lactated ringers 20 mL/hr at 09/08/11 0600    Physical Exam: The patient appears to be in no distress.  Head and neck  exam reveals that the pupils are equal and reactive.  The extraocular movements are full.  There is no scleral icterus.  Mouth and pharynx are benign.  No lymphadenopathy.  No carotid bruits.  The jugular venous pressure is normal.  Thyroid is not enlarged or tender.  Chest is clear to percussion and auscultation.  No rales or rhonchi.  Expansion of the chest is symmetrical.  Heart reveals no abnormal lift or heave.  First and second heart sounds are normal.  There is no murmur gallop rub or click.  The abdomen is soft and nontender.  Bowel sounds are normoactive.  There is no hepatosplenomegaly or mass.  There are no abdominal bruits.  Extremities Trace edema.  Pedal pulses are good.  There is no cyanosis or clubbing.    Integument reveals no rash  Lab Results:  Basename 09/10/11 0530 09/09/11 0400  WBC 14.1* 17.3*  HGB 10.4* 10.6*  PLT 168 161    Basename 09/10/11 0530 09/09/11 0400  NA 132* 134*  K 4.2 4.5  CL 99 100  CO2 26 26  GLUCOSE 211* 137*  BUN 14 17  CREATININE 0.58 0.68   No results found for this basename: TROPONINI:2,CK,MB:2 in the last 72 hours Hepatic Function Panel No results found for this basename: PROT,ALBUMIN,AST,ALT,ALKPHOS,BILITOT,BILIDIR,IBILI in the last 72 hours No results found for this basename: CHOL in the last 72 hours No results found for this basename: PROTIME in the last 72 hours  Imaging: Imaging results have been reviewed  Cardiac Studies:  Assessment/Plan:  Patient Active Hospital Problem List: Possible (NSTEMI) (09/01/2011)   Assessment: Doing well since CABG   Plan: Continue present Rx   LOS: 10 days    Cassell Clement 09/10/2011, 8:47 AM

## 2011-09-10 NOTE — Progress Notes (Addendum)
[address]            [place] [address]          [phone]     3 Days Post-Op  Procedure(s) (LRB): CORONARY ARTERY BYPASS GRAFTING (CABG) (N/A) Subjective: Continues to feel better  Objective  Telemetry NSR  Temp:  [97.3 F (36.3 C)-98.9 F (37.2 C)] 97.9 F (36.6 C) (12/22 0538) Pulse Rate:  [70-85] 71  (12/22 0538) Resp:  [19-23] 20  (12/22 0538) BP: (95-146)/(50-74) 132/66 mmHg (12/22 0538) SpO2:  [94 %-100 %] 98 % (12/22 0538) FiO2 (%):  [0 %] 0 % (12/21 1437) Weight:  [306 lb 3.5 oz (138.9 kg)] 306 lb 3.5 oz (138.9 kg) (12/22 0538)   Intake/Output Summary (Last 24 hours) at 09/10/11 0908 Last data filed at 09/09/11 2203  Gross per 24 hour  Intake    720 ml  Output    125 ml  Net    595 ml       General appearance: alert, cooperative and no distress Heart: regular rate and rhythm, S1, S2 normal, no murmur, click, rub or gallop Lungs: diminished in the bases Abdomen: soft, non-tender; bowel sounds normal; no masses,  no organomegaly Extremities: 1-2+ BLE edema Wound: incisions healing well without signs of infecton  Lab Results:  Basename 09/10/11 0530 09/09/11 0400 09/08/11 1700 09/08/11 0355  NA 132* 134* -- --  K 4.2 4.5 -- --  CL 99 100 -- --  CO2 26 26 -- --  GLUCOSE 211* 137* -- --  BUN 14 17 -- --  CREATININE 0.58 0.68 -- --  CALCIUM 9.2 8.7 -- --  MG -- -- 2.3 2.5  PHOS -- -- -- --   No results found for this basename: AST:2,ALT:2,ALKPHOS:2,BILITOT:2,PROT:2,ALBUMIN:2 in the last 72 hours No results found for this basename: LIPASE:2,AMYLASE:2 in the last 72 hours  Basename 09/10/11 0530 09/09/11 0400  WBC 14.1* 17.3*  NEUTROABS -- --  HGB 10.4* 10.6*  HCT 32.2* 32.3*  MCV 89.4 90.0  PLT 168 161   No results found for this basename: CKTOTAL:4,CKMB:4,TROPONINI:4 in the last 72 hours No components found with this basename: POCBNP:3 No results found for this basename: DDIMER in the last 72 hours No  results found for this basename: HGBA1C in the last 72 hours No results found for this basename: CHOL,HDL,LDLCALC,TRIG,CHOLHDL in the last 72 hours No results found for this basename: TSH,T4TOTAL,FREET3,T3FREE,THYROIDAB in the last 72 hours No results found for this basename: VITAMINB12,FOLATE,FERRITIN,TIBC,IRON,RETICCTPCT in the last 72 hours  Medications: Scheduled    . acetaminophen  1,000 mg Oral Q6H   Or  . acetaminophen (TYLENOL) oral liquid 160 mg/5 mL  975 mg Per Tube Q6H  . aspirin EC  325 mg Oral Daily   Or  . aspirin  324 mg Per Tube Daily  . bisacodyl  10 mg Oral Daily   Or  . bisacodyl  10 mg Rectal Daily  . budesonide-formoterol  2 puff Inhalation BID  . cefUROXime (ZINACEF)  IV  1.5 g Intravenous Q12H  . docusate sodium  200 mg Oral Daily  . enoxaparin  40 mg Subcutaneous QHS  . insulin aspart  0-20 Units Subcutaneous TID WC  . insulin aspart  0-5 Units Subcutaneous QHS  . insulin aspart  8 Units Subcutaneous TID WC  . insulin glargine  50 Units Subcutaneous Q0700  . metoCLOPramide  10  mg Oral TID AC & HS  . metoprolol tartrate  25 mg Oral BID   Or  . metoprolol tartrate  25 mg Per Tube BID  . moving right along book   Does not apply Once  . pantoprazole  40 mg Oral Q1200  . potassium chloride  20 mEq Oral BID  . povidone-iodine  1 application Topical BID  . rosuvastatin  40 mg Oral Q0600  . sodium chloride  3 mL Intravenous Q12H  . DISCONTD: sodium chloride  3 mL Intravenous Q12H     Radiology/Studies:  Dg Chest Portable 1 View In Am  09/09/2011  *RADIOLOGY REPORT*  Clinical Data: Postop open heart surgery, follow-up  PORTABLE CHEST - 1 VIEW  Comparison: Portable chest x-ray of 09/07/2011  Findings: The endotracheal tube, Swan-Ganz catheter, and left chest tube have been removed.  No pneumothorax is seen.  A venous sheath remains in the SVC.  There is cardiomegaly present with basilar linear atelectasis and probable small effusions.  Median sternotomy sutures  are noted.  IMPRESSION:  1.  Endotracheal tube, Swan-Ganz catheter, and left chest tube removed.  No pneumothorax. 2.  Basilar opacities are present consistent with atelectasis and effusions.  Original Report Authenticated By: Juline Patch, M.D.    INR: Will add last result for INR, ABG once components are confirmed Will add last 4 CBG results once components are confirmed  Assessment/Plan: S/P Procedure(s) (LRB): CORONARY ARTERY BYPASS GRAFTING (CABG) (N/A)  1. Steady progress 2.Rhythm stable 3.continue diuresis 4. CBG200-239 range for 24 hours, increase lantus 5. Leukocytosis improving, h/h stable 6. Wean O2, pulm toilet/cardiac rehab  LOS: 10 days    GOLD,WAYNE E 12/22/20129:08 AM    I have seen and examined the patient and agree with the assessment above.  Was on 100 units lantus BID and 15 units TID meal coverage at home

## 2011-09-10 NOTE — Progress Notes (Signed)
CARDIAC REHAB PHASE I   PRE:  Rate/Rhythm: 74sinus rhythm  BP:  Supine:  Sitting: 120/58   Standing:    SaO2: 97% 2L  MODE:  Ambulation: 550 ft   POST:  Rate/Rhythem: 84  BP:  Supine:   Sitting: 124/54  Standing:    SaO2: 99% 2L  0981-1914 Pt ambulated with rolling walker without difficulty.  1 person assist.  Pt denies sx. Education completed.  Pt requests referral to outpatient cardiac rehab [facility]  [place], [place]

## 2011-09-11 DIAGNOSIS — I2510 Atherosclerotic heart disease of native coronary artery without angina pectoris: Secondary | ICD-10-CM

## 2011-09-11 LAB — GLUCOSE, CAPILLARY: Glucose-Capillary: 173 mg/dL — ABNORMAL HIGH (ref 70–99)

## 2011-09-11 MED ORDER — ASPIRIN 325 MG PO TBEC: 325.0000 mg | DELAYED_RELEASE_TABLET | Freq: Every day | ORAL | Status: DC

## 2011-09-11 MED ORDER — INSULIN GLARGINE 100 UNIT/ML ~~LOC~~ SOLN
75.0000 [IU] | Freq: Two times a day (BID) | SUBCUTANEOUS | Status: DC
  Administered 2011-09-11: 75 [IU] via SUBCUTANEOUS

## 2011-09-11 MED ORDER — INSULIN GLARGINE 100 UNIT/ML ~~LOC~~ SOLN
100.0000 [IU] | Freq: Two times a day (BID) | SUBCUTANEOUS | Status: DC
  Administered 2011-09-11: 100 [IU] via SUBCUTANEOUS

## 2011-09-11 MED ORDER — INSULIN ASPART 100 UNIT/ML ~~LOC~~ SOLN
15.0000 [IU] | Freq: Three times a day (TID) | SUBCUTANEOUS | Status: DC
  Administered 2011-09-11 (×2): 15 [IU] via SUBCUTANEOUS

## 2011-09-11 MED ORDER — OXYCODONE HCL 5 MG PO TABS: 5.0000 mg | ORAL_TABLET | Freq: Four times a day (QID) | ORAL | Status: AC | PRN

## 2011-09-11 MED ORDER — ROSUVASTATIN CALCIUM 40 MG PO TABS: 40.0000 mg | ORAL_TABLET | Freq: Every day | ORAL | Status: DC

## 2011-09-11 MED ORDER — METOPROLOL TARTRATE 25 MG PO TABS: 25.0000 mg | ORAL_TABLET | Freq: Two times a day (BID) | ORAL | Status: DC

## 2011-09-11 NOTE — Progress Notes (Addendum)
[address]            [place] [address]          [phone]     4 Days Post-Op  Procedure(s) (LRB): CORONARY ARTERY BYPASS GRAFTING (CABG) (N/A) Subjective: Feeling better, stronger  Objective  Telemetry NSR, PVC's  Temp:  [97.9 F (36.6 C)-98.6 F (37 C)] 97.9 F (36.6 C) (12/23 0541) Pulse Rate:  [68-73] 68  (12/23 0541) Resp:  [18-20] 20  (12/23 0541) BP: (121-144)/(61-75) 144/75 mmHg (12/23 0541) SpO2:  [96 %-99 %] 99 % (12/23 0541) Weight:  [306 lb (138.801 kg)] 306 lb (138.801 kg) (12/23 0541)   Intake/Output Summary (Last 24 hours) at 09/11/11 0918 Last data filed at 09/11/11 0600  Gross per 24 hour  Intake    600 ml  Output    450 ml  Net    150 ml       General appearance: alert, cooperative and no distress Heart: regular rate and rhythm and S1, S2 normal Lungs: min diminished in the bases, otherwise clear Abdomen: soft, nontender Extremities: BLE edema improving Wound: incisions healing well  Lab Results:  Basename 09/10/11 0530 09/09/11 0400 09/08/11 1700  NA 132* 134* --  K 4.2 4.5 --  CL 99 100 --  CO2 26 26 --  GLUCOSE 211* 137* --  BUN 14 17 --  CREATININE 0.58 0.68 --  CALCIUM 9.2 8.7 --  MG -- -- 2.3  PHOS -- -- --   No results found for this basename: AST:2,ALT:2,ALKPHOS:2,BILITOT:2,PROT:2,ALBUMIN:2 in the last 72 hours No results found for this basename: LIPASE:2,AMYLASE:2 in the last 72 hours  Basename 09/10/11 0530 09/09/11 0400  WBC 14.1* 17.3*  NEUTROABS -- --  HGB 10.4* 10.6*  HCT 32.2* 32.3*  MCV 89.4 90.0  PLT 168 161   No results found for this basename: CKTOTAL:4,CKMB:4,TROPONINI:4 in the last 72 hours No components found with this basename: POCBNP:3 No results found for this basename: DDIMER in the last 72 hours No results found for this basename: HGBA1C in the last 72 hours No results found for this basename: CHOL,HDL,LDLCALC,TRIG,CHOLHDL in the last 72 hours No results found  for this basename: TSH,T4TOTAL,FREET3,T3FREE,THYROIDAB in the last 72 hours No results found for this basename: VITAMINB12,FOLATE,FERRITIN,TIBC,IRON,RETICCTPCT in the last 72 hours  Medications: Scheduled    . acetaminophen  1,000 mg Oral Q6H   Or  . acetaminophen (TYLENOL) oral liquid 160 mg/5 mL  975 mg Per Tube Q6H  . aspirin EC  325 mg Oral Daily   Or  . aspirin  324 mg Per Tube Daily  . bisacodyl  10 mg Oral Daily   Or  . bisacodyl  10 mg Rectal Daily  . budesonide-formoterol  2 puff Inhalation BID  . docusate sodium  200 mg Oral Daily  . enoxaparin  40 mg Subcutaneous QHS  . furosemide  40 mg Oral Daily  . insulin aspart  0-20 Units Subcutaneous TID WC  . insulin aspart  0-5 Units Subcutaneous QHS  . insulin aspart  12 Units Subcutaneous TID WC  . insulin glargine  60 Units Subcutaneous BID  . metoCLOPramide  10 mg Oral TID AC & HS  . metoprolol tartrate  25 mg Oral BID   Or  . metoprolol tartrate  25 mg Per Tube BID  . pantoprazole  40 mg Oral Q1200  . potassium chloride  20  mEq Oral Daily  . povidone-iodine  1 application Topical BID  . rosuvastatin  40 mg Oral Q0600  . sodium chloride  3 mL Intravenous Q12H  . DISCONTD: insulin aspart  8 Units Subcutaneous TID WC  . DISCONTD: insulin glargine  60 Units Subcutaneous Q0700     Radiology/Studies:  No results found.  INR: Will add last result for INR, ABG once components are confirmed Will add last 4 CBG results once components are confirmed  Assessment/Plan: S/P Procedure(s) (LRB): CORONARY ARTERY BYPASS GRAFTING (CABG) (N/A)  1. conts to improve, wean O2 off, cont to mobilize, diurese. 2. Sugars 178-237 range , lantus increased yesterday, was on 100 bid at home, will increase further 3. D/c epw's 4. Poss home in am   LOS: 11 days    Rivas,Felicia E 12/23/20129:18 AM    Pt seen and examined, agree with above Will change lantus and meal coverage to home doses.

## 2011-09-11 NOTE — Progress Notes (Signed)
Subjective:  Patient is doing well. Rhythm remains NSR No chest pain or dyspnea at rest.  Objective:  Vital Signs in the last 24 hours: Temp:  [97.9 F (36.6 C)-98.6 F (37 C)] 97.9 F (36.6 C) (12/23 0541) Pulse Rate:  [68-73] 68  (12/23 0541) Resp:  [18-20] 20  (12/23 0541) BP: (121-144)/(61-75) 144/75 mmHg (12/23 0541) SpO2:  [96 %-99 %] 99 % (12/23 0541) Weight:  [306 lb (138.801 kg)] 306 lb (138.801 kg) (12/23 0541)  Intake/Output from previous day: 12/22 0701 - 12/23 0700 In: 840 [P.O.:840] Out: 450 [Urine:450] Intake/Output from this shift:       . acetaminophen  1,000 mg Oral Q6H   Or  . acetaminophen (TYLENOL) oral liquid 160 mg/5 mL  975 mg Per Tube Q6H  . aspirin EC  325 mg Oral Daily   Or  . aspirin  324 mg Per Tube Daily  . bisacodyl  10 mg Oral Daily   Or  . bisacodyl  10 mg Rectal Daily  . budesonide-formoterol  2 puff Inhalation BID  . docusate sodium  200 mg Oral Daily  . enoxaparin  40 mg Subcutaneous QHS  . furosemide  40 mg Oral Daily  . insulin aspart  0-20 Units Subcutaneous TID WC  . insulin aspart  0-5 Units Subcutaneous QHS  . insulin aspart  12 Units Subcutaneous TID WC  . insulin glargine  60 Units Subcutaneous BID  . metoCLOPramide  10 mg Oral TID AC & HS  . metoprolol tartrate  25 mg Oral BID   Or  . metoprolol tartrate  25 mg Per Tube BID  . pantoprazole  40 mg Oral Q1200  . potassium chloride  20 mEq Oral Daily  . povidone-iodine  1 application Topical BID  . rosuvastatin  40 mg Oral Q0600  . sodium chloride  3 mL Intravenous Q12H  . DISCONTD: insulin aspart  8 Units Subcutaneous TID WC  . DISCONTD: insulin glargine  50 Units Subcutaneous Q0700  . DISCONTD: insulin glargine  60 Units Subcutaneous Q0700  . DISCONTD: potassium chloride  20 mEq Oral BID      Physical Exam: The patient appears to be in no distress.  Head and neck exam reveals that the pupils are equal and reactive.  The extraocular movements are full.  There  is no scleral icterus.  Mouth and pharynx are benign.  No lymphadenopathy.  No carotid bruits.  The jugular venous pressure is normal.  Thyroid is not enlarged or tender.  Chest is clear to percussion and auscultation.  No rales or rhonchi.  Expansion of the chest is symmetrical.  Heart reveals no abnormal lift or heave.  First and second heart sounds are normal.  There is no murmur gallop rub or click.  The abdomen is soft and nontender.  Bowel sounds are normoactive.  There is no hepatosplenomegaly or mass.  There are no abdominal bruits.  Extremities Trace edema.  Pedal pulses are good.  There is no cyanosis or clubbing.    Integument reveals no rash  Lab Results:  Basename 09/10/11 0530 09/09/11 0400  WBC 14.1* 17.3*  HGB 10.4* 10.6*  PLT 168 161    Basename 09/10/11 0530 09/09/11 0400  NA 132* 134*  K 4.2 4.5  CL 99 100  CO2 26 26  GLUCOSE 211* 137*  BUN 14 17  CREATININE 0.58 0.68   No results found for this basename: TROPONINI:2,CK,MB:2 in the last 72 hours Hepatic Function Panel No results found  for this basename: PROT,ALBUMIN,AST,ALT,ALKPHOS,BILITOT,BILIDIR,IBILI in the last 72 hours No results found for this basename: CHOL in the last 72 hours No results found for this basename: PROTIME in the last 72 hours  Imaging: Imaging results have been reviewed.  No  New studies.  Cardiac Studies: Telemetry shows NSR Assessment/Plan:  Patient Active Hospital Problem List: Possible (NSTEMI) (09/01/2011)   Assessment: Doing well since CABG.  Good progress.   Plan: Continue present Rx   LOS: 11 days    Cassell Clement 09/11/2011, 8:56 AM

## 2011-09-11 NOTE — Discharge Summary (Signed)
[address]            [place] [address]          [phone]      Felicia Rivas 1956/03/21 55 y.o. [id number]  08/31/2011   Alleen Borne, MD  CHF (congestive heart failure) [428.0] Community acquired bacterial pneumonia [482.9] Non-ST elevation MI (NSTEMI) [410.90] RESPIRATORY DISTRESS chest pain CAD  HPI: At the time of admission Felicia Rivas is an 55 y.o. female with history of asthma, diabetes, diagnosed with community-acquired pneumonia, obesity, presents with acute worsening of her shortness of breath and wheezing. Apparently, she was a gas station and suddenly developed severe shortness of breath and wheezing. EMS was summoned, and she was placed on CPAP with immediate improvement of her breathing status. In the emergency room, she was found to be hypoxemic and BiPAP was placed. She was evaluated and was found that her chest x-ray was suggestive of congestive heart failure versus pneumonia. A pro BNP was 300's. Her ABG did not show hypoxemia or acidosis. She was also given Lasix, and her breathing status improved markedly. Her blood glucose was also elevated in the mid 300s. Hospitalist was asked to admit her for treatment of her community-acquired pneumonia, congestive heart failure, hyperglycemia, and asthma exarcebation.  Rewiew of Systems: At the time of admission The patient denies anorexia, fever, weight loss,, vision loss, decreased hearing, hoarseness, chest pain, syncope, , peripheral edema, balance deficits, hemoptysis, abdominal pain, melena, hematochezia, severe indigestion/heartburn, hematuria, incontinence, genital sores, muscle weakness, suspicious skin lesions, transient blindness, difficulty walking, depression, unusual weight change, abnormal bleeding, enlarged lymph nodes, angioedema, and breast masses.  Past Medical History   Diagnosis  Date   .  Asthma    .  Diabetes mellitus     Past Surgical History   Procedure  Date     .  Cholecystectomy     Medications:  HOME MEDS:  Prior to Admission medications   Medication  Sig  Start Date  End Date  Taking?  Authorizing Provider   acetaminophen (TYLENOL) 500 MG tablet  Take 1,000 mg by mouth every 6 (six) hours as needed. For pain    Yes  Historical Provider, MD   albuterol (PROVENTIL) (2.5 MG/3ML) 0.083% nebulizer solution  Take 2.5 mg by nebulization 2 (two) times daily as needed. For shortness of breath and wheezing    Yes  Historical Provider, MD   budesonide-formoterol (SYMBICORT) 160-4.5 MCG/ACT inhaler  Inhale 2 puffs into the lungs 2 (two) times daily.    Yes  Historical Provider, MD   insulin glargine (LANTUS) 100 UNIT/ML injection  Inject 100 Units into the skin 2 (two) times daily.    Yes  Historical Provider, MD   insulin lispro (HUMALOG) 100 UNIT/ML injection  Inject 15 Units into the skin 3 (three) times daily before meals.    Yes  Historical Provider, MD   moxifloxacin (AVELOX) 400 MG tablet  Take 400 mg by mouth daily. Finished 08/31/11 after a 5 day course of therapy.    Yes  Historical Provider, MD   albuterol (PROVENTIL) (5 MG/ML) 0.5% nebulizer solution  Take 10 mg by nebulization once.     Historical Provider, MD   ipratropium (ATROVENT) 0.02 % nebulizer solution  Take 500 mcg by nebulization once.     Historical Provider, MD   methylPREDNISolone sodium succinate (SOLU-MEDROL) 125 MG injection  Inject  125 mg into the vein once.     Historical Provider, MD   Allergies:  No Known Allergies  Social History:  does not have a smoking history on file. She does not have any smokeless tobacco history on file. She reports that she does not drink alcohol or use illicit drugs.  Family History:  Family History   Problem  Relation  Age of Onset   .  Cancer  Mother    .  Heart failure  Mother    .  Diabetes  Mother     Physical Exam:  Filed Vitals:    08/31/11 2200  08/31/11 2300  08/31/11 2315  08/31/11 2340   BP:  134/59  146/57  129/66    Pulse:  66   74  77    Resp:  16  22  22     SpO2:  100%  100%  99%  100%    Blood pressure 129/66, pulse 77, resp. rate 22, SpO2 100.00%.  GEN: Pleasant person lying in the stretcher in no acute distress; cooperative with exam  PSYCH: alert and oriented x4; does not appear anxious does not appear depressed; affect is normal  HEENT: Mucous membranes pink and anicteric; PERRLA; EOM intact; no cervical lymphadenopathy nor thyromegaly or carotid bruit; no JVD;  Breasts:: Not examined  CHEST WALL: No tenderness  CHEST: Diffuse wheezing. She has shallow and rapid respirations.  HEART: Regular rate and rhythm; no murmurs rubs or gallops  BACK: No kyphosis or scoliosis; no CVA tenderness  ABDOMEN: Obese, soft non-tender; no masses, no organomegaly, normal abdominal bowel sounds; no pannus; no intertriginous candida.  Rectal Exam: Not done  EXTREMITIES: No bone or joint deformity; age-appropriate arthropathy of the hands and knees; she has trace edema but no ulcerations.  Genitalia: not examined  PULSES: 2+ and symmetric  SKIN: Normal hydration no rash or ulceration  CNS: Cranial nerves 2-12 grossly intact no focal neurologic deficit   The patient was initially admitted with community-acquired pneumonia with findings additionally of congestive heart failure. She was started on intravenous steroids as well as Rocephin and Zithromax. She was started on a course of diuretics as well. An echocardiogram was to be obtained to evaluate her left ventricular function. She ruled in for a non-ST segment elevation myocardial infarction and cardiology consultation was obtained with Olga Millers M.D. she was felt to require further evaluation to include cardiac catheterization which was done on 09/02/2011 by Dr. Eustaquio Maize. The following was noted:  Procedure: Left Heart Cath, Selective Coronary Angiography,  Indication: non stemi  Procedural Details: The right wrist was prepped, draped, and anesthetized with 1%  lidocaine. Using the modified Seldinger technique, a 5 French sheath was introduced into the right radial artery. 3 mg of verapamil was administered through the sheath, weight-based unfractionated heparin was administered intravenously. Standard Judkins catheters were used for selective coronary angiography and left ventriculography. Catheter exchanges were performed over an exchange length guidewire. There were no immediate procedural complications. A TR band was used for radial hemostasis at the completion of the procedure. The patient was transferred to the post catheterization recovery area for further monitoring.  Procedural Findings:  Hemodynamics:  AO 150/79 (106)  LV 164/48  Coronary angiography:  Coronary dominance: right  Left mainstem: The left main coronary stem is calcified and short but without significant obstruction.  Left anterior descending (LAD): The left anterior descending artery is fairly heavily calcified throughout its course. There is segmental plaque throughout the proximal vessel  but not high-grade significant obstruction. There is approximate 70-80% stenosis just beyond the origin of the major diagonal near a septal perforator. It does not appear to be the culprit vessel.  Left circumflex (LCx): The circumflex coronary artery provides a first marginal branch has mild ostial irregularity but no high-grade stenosis. The AV circumflex and provides a second marginal branch which has a high-grade stenosis of approximately 80% the distal portion of this large OM 2 bifurcates and is a large caliber vessel the AV circumflex supplies a small posterolateral segment and has about 60-70% narrowing. Small posterolateral segment is nongraftable. The large OM 2 is a graftable vessel.  Right coronary artery (RCA): The right coronary artery demonstrates about 40% segmental plaque near the junction of the proximal and mid vessel. It then opens up and then there is 30% narrowing at the acute  margin distally, there is about an 80% area stenosis prior to the takeoff of the posterior descending branch which is large and graftable and the somewhat smaller posterolateral branch.  Final Conclusions:  1. 3 vessel coronary artery disease with significant involvement of the right coronary artery and moderately high-grade involvement of the obtuse marginal and left anterior descending artery.  2. Pulmonary edema with marked elevation in left ventricular end-diastolic pressure likely accounting for findings on CT.   Due to these findings cardiothoracic surgical consultation was then obtained. She was seen in consultation by Dr. Evelene Croon. His impression that time of consultation is as follows:  Assessment / Plan:  She has significant three-vessel coronary disease presenting with acute shortness of breath secondary to pulmonary edema with recurrent episodes of the same. She has recently been treated for pneumonia but I think this is most likely pulmonary edema. She has evidence of moderate left ventricular hypertrophy by echocardiogram with a markedly elevated LVEDP by catheterization and I'm sure she has diastolic dysfunction. She had elevated cardiac enzymes at the time of presentation consistent with non-ST segment elevation MI and acute ischemia on top of her diastolic dysfunction likely lead to pulmonary edema. I agree that revascularization surgery is the best treatment for this obese diabetic patient with three-vessel coronary disease. I think we should continue diuresis to try clear up her lungs as much as possible before proceeding with surgery sometime early next week. I discussed the importance of maximum cardiac risk factor reduction with her and the consequences of not following through with that. I discussed the operative procedure with the patient including alternatives, benefits and risks; including but not limited to bleeding, blood transfusion, infection, stroke, myocardial infarction,  graft failure, heart block requiring a permanent pacemaker, organ dysfunction, and death. Felicia Rivas understands and agrees to proceed. We will schedule surgery for sometime early next week depending on how quickly the pulmonary edema clears up on her chest x-ray.    she continued to be stabilized from a medical view point and ultimately she was deemed to be acceptable for proceeding with surgery. The following procedure was performed on 09/07/2011:  PREOPERATIVE DIAGNOSIS: Severe three-vessel coronary artery disease.  POSTOPERATIVE DIAGNOSIS: Severe three-vessel coronary artery disease.  OPERATIVE PROCEDURE: Median sternotomy, extracorporeal circulation,  coronary artery bypass graft surgery x3 using a left internal mammary  artery graft to the left anterior descending coronary artery, with a  saphenous vein graft to the second obtuse marginal branch of the left  circumflex coronary artery, and a saphenous vein graft to the posterior  descending branch of the right coronary artery. Endoscopic vein  harvesting from the  right leg.  ATTENDING SURGEON: Evelene Croon, MD  ASSISTANT: Coral Ceo, PA-C  ANESTHESIA: General endotracheal.  She tolerated procedure well and was taken to the surgical intensive care he in stable condition.  Postoperative hospital course:  Overall the patient has progressed nicely. He is maintained stable hemodynamics. He was weaned from the ventilator without difficulty. Diabetes is currently under control and she has been transitioned to her preoperative regimen. She has had no significant cardiac dysrhythmias. She is tolerating a gradual increase in activities using standard protocols. Incisions are healing well without evidence of infection. She does have a moderate volume overload. The nuclear edema is improving daily. She has been weaned from oxygen and maintained good saturations on room air her pulmonary status is quite stable. She does have an acute blood loss  anemia which is stable. Most recent hematocrit dated 1222 2012 is 32.2. Currently her status is felt to be stable for tentative discharge in the morning pending rounds.         Basename 09/10/11 0530 09/09/11 0400  NA 132* 134*  K 4.2 4.5  CL 99 100  CO2 26 26  GLUCOSE 211* 137*  BUN 14 17  CALCIUM 9.2 8.7    Basename 09/10/11 0530 09/09/11 0400  WBC 14.1* 17.3*  HGB 10.4* 10.6*  HCT 32.2* 32.3*  PLT 168 161   No results found for this basename: INR:2 in the last 72 hours   Discharge Instructions:  The patient is discharged to home with extensive instructions on wound care and progressive ambulation.  They are instructed not to drive or perform any heavy lifting until returning to see the physician in his office.  Discharge Diagnosis:  CHF (congestive heart failure) [428.0] Community acquired bacterial pneumonia [482.9] Non-ST elevation MI (NSTEMI) [410.90] RESPIRATORY DISTRESS chest pain CAD  Secondary Diagnosis: Patient Active Problem List  Diagnoses  . PNA (pneumonia)  . Asthma  . CHF (congestive heart failure)  . DM2 (diabetes mellitus, type 2)  . Possible (NSTEMI)  . Obesity   Past Medical History  Diagnosis Date  . Asthma   . Diabetes mellitus   . PONV (postoperative nausea and vomiting)   . Sleep disturbance     Had sleep study several years ago, wasn't told she has OSA but told she needs O2 at night  . Abnormal stress test     Failed stress test 3 years ago in [facility] for cough -  had f/u echo showing a "valve didn't open well" . No cath  . Myocardial infarction        Felicia Rivas, Felicia Rivas  Home Medication Instructions [id number]   Printed on:09/11/11 1256  Medication Information                    insulin lispro (HUMALOG) 100 UNIT/ML injection Inject 15 Units into the skin 3 (three) times daily before meals.             insulin glargine (LANTUS) 100 UNIT/ML injection Inject 100 Units into the skin 2 (two) times daily.               budesonide-formoterol (SYMBICORT) 160-4.5 MCG/ACT inhaler Inhale 2 puffs into the lungs 2 (two) times daily.            albuterol (PROVENTIL) (2.5 MG/3ML) 0.083% nebulizer solution Take 2.5 mg by nebulization 2 (two) times daily as needed. For shortness of breath and wheezing            aspirin EC 325  MG EC tablet Take 1 tablet (325 mg total) by mouth daily.           metoprolol tartrate (LOPRESSOR) 25 MG tablet Take 1 tablet (25 mg total) by mouth 2 (two) times daily.           oxyCODONE (OXY IR/ROXICODONE) 5 MG immediate release tablet Take 1-2 tablets (5-10 mg total) by mouth every 6 (six) hours as needed.           rosuvastatin (CRESTOR) 40 MG tablet Take 1 tablet (40 mg total) by mouth daily at 6 (six) AM.             Disposition: Discharged home  Patient's condition is Good  Gershon Crane, PA-C 09/11/2011  12:56 PM

## 2011-09-12 ENCOUNTER — Encounter (HOSPITAL_COMMUNITY): Payer: Self-pay | Admitting: *Deleted

## 2011-09-12 LAB — GLUCOSE, CAPILLARY
Glucose-Capillary: 130 mg/dL — ABNORMAL HIGH (ref 70–99)
Glucose-Capillary: 198 mg/dL — ABNORMAL HIGH (ref 70–99)
Glucose-Capillary: 240 mg/dL — ABNORMAL HIGH (ref 70–99)

## 2011-09-12 LAB — CULTURE, BLOOD (ROUTINE X 2)

## 2011-09-12 MED ORDER — POTASSIUM CHLORIDE CRYS ER 20 MEQ PO TBCR: 20.0000 meq | EXTENDED_RELEASE_TABLET | Freq: Every day | ORAL | Status: DC

## 2011-09-12 MED ORDER — FUROSEMIDE 40 MG PO TABS: 40.0000 mg | ORAL_TABLET | Freq: Every day | ORAL | Status: DC

## 2011-09-12 NOTE — Progress Notes (Signed)
5 Days Post-Op Procedure(s) (LRB): CORONARY ARTERY BYPASS GRAFTING (CABG) (N/A)  Subjective: Feels well.  Has not had a BM since surgery.  Has been on O2 2L overnight, but denies SOB.  Objective: Vital signs in last 24 hours: Patient Vitals for the past 24 hrs:  BP Temp Temp src Pulse Resp SpO2 Weight  09/12/11 0530 174/79 mmHg 97.5 F (36.4 C) Oral 67  20  100 % 307 lb 8.7 oz (139.5 kg)  09/11/11 2044 - - - - - 96 % -  09/11/11 2030 108/68 mmHg 98.9 F (37.2 C) Oral 65  22  98 % -  09/11/11 1416 112/57 mmHg 98 F (36.7 C) - 72  18  97 % -  09/11/11 1330 112/41 mmHg - - - - 94 % -  09/11/11 1315 124/54 mmHg - - 69  20  98 % -  09/11/11 1245 109/48 mmHg 98.7 F (37.1 C) - 70  20  96 % -  09/11/11 1230 118/62 mmHg - - 74  20  97 % -  09/11/11 0918 - - - - - 97 % -   Current Weight  09/12/11 307 lb 8.7 oz (139.5 kg)   Pre-op wt= 134 kg  Intake/Output from previous day: 12/23 0701 - 12/24 0700 In: 960 [P.O.:960] Out: 775 [Urine:775]  CBGs 233-173-198  PHYSICAL EXAM:  Heart: RRR Lungs: slightly diminished BS in bases Wound: clean and dry  Extremities: +LE edema  Lab Results: CBC: Basename 09/10/11 0530  WBC 14.1*  HGB 10.4*  HCT 32.2*  PLT 168   BMET:  Basename 09/10/11 0530  NA 132*  K 4.2  CL 99  CO2 26  GLUCOSE 211*  BUN 14  CREATININE 0.58  CALCIUM 9.2    PT/INR: No results found for this basename: LABPROT,INR in the last 72 hours   Assessment/Plan: S/P Procedure(s) (LRB): CORONARY ARTERY BYPASS GRAFTING (CABG) (N/A) CV- BP up this am, but has been generally stable.  Continue current meds. DM- sugars remain elevated, pt now back on home insulin regimen.  A1C 8.1.  Will watch.  Plan d/c home today.  Benji Poynter H 09/12/2011

## 2011-09-12 NOTE — Discharge Summary (Signed)
ADDENDUM:  This is an addendum to a previously dictated discharge summary.  The patient has been evaluated on today's date and is ready for discharge home.  Dr. Laneta Simmers has reviewed her medications, and feels that in light of her volume overload, as well has her diastolic dysfunction, she will require long-term diuresis.  Therefore, she has been continued on Lasix 40 mg daily as well as KCl 20 meq daily.  There were no other changes to the previous discharge summary.    Coral Ceo, [place] 09-12-2011 [phone] am

## 2011-09-12 NOTE — Progress Notes (Signed)
5 Days Post-Op Procedure(s) (LRB): CORONARY ARTERY BYPASS GRAFTING (CABG) (N/A) Subjective: Feels well, no complaints  Objective: Vital signs in last 24 hours: Temp:  [97.5 F (36.4 C)-98.9 F (37.2 C)] 97.5 F (36.4 C) (12/24 0530) Pulse Rate:  [65-74] 67  (12/24 0530) Cardiac Rhythm:  [-] Normal sinus rhythm (12/23 2100) Resp:  [18-22] 20  (12/24 0530) BP: (108-174)/(41-79) 174/79 mmHg (12/24 0530) SpO2:  [94 %-100 %] 100 % (12/24 0530) Weight:  [139.5 kg (307 lb 8.7 oz)] 307 lb 8.7 oz (139.5 kg) (12/24 0530)  Hemodynamic parameters for last 24 hours:    Intake/Output from previous day: 12/23 0701 - 12/24 0700 In: 960 [P.O.:960] Out: 775 [Urine:775] Intake/Output this shift:    General appearance: alert and cooperative Heart: regular rate and rhythm, S1, S2 normal, no murmur, click, rub or gallop Lungs: clear to auscultation bilaterally Extremities: edema resolved Wound: incisions healing well  Lab Results:  Basename 09/10/11 0530  WBC 14.1*  HGB 10.4*  HCT 32.2*  PLT 168   BMET:  Basename 09/10/11 0530  NA 132*  K 4.2  CL 99  CO2 26  GLUCOSE 211*  BUN 14  CREATININE 0.58  CALCIUM 9.2    PT/INR: No results found for this basename: LABPROT,INR in the last 72 hours ABG    Component Value Date/Time   PHART 7.367 09/07/2011 1748   HCO3 23.9 09/07/2011 1748   TCO2 26 09/08/2011 1702   ACIDBASEDEF 2.0 09/07/2011 1748   O2SAT 92.0 09/07/2011 1748   CBG (last 3)   Basename 09/12/11 0653 09/11/11 1620 09/11/11 1122  GLUCAP 198* 173* 233*    Assessment/Plan: S/P Procedure(s) (LRB): CORONARY ARTERY BYPASS GRAFTING (CABG) (N/A) She is doing well. Diabetes:  Glucose is still higher than ideal over the long term.  Will send her home on her previous regimen and this will have to be followed up as outpt with her primary physician. Plan for discharge: see discharge orders   LOS: 12 days    Felicia Rivas K 09/12/2011

## 2011-09-14 ENCOUNTER — Telehealth: Payer: Self-pay | Admitting: Cardiology

## 2011-09-14 NOTE — Telephone Encounter (Signed)
I spoke with the pt's daughter and made her aware that per DC med list the pt should be taking ASA 325mg  daily.  The pt said she was also on a NTG drip IV and wanted to know if she needs a Rx for NTG.  I told the pt's daughter that the pt may need a Rx for NTG SL as needed.  I attempted to give the pt's daughter instructions about the use of SL NTG but she did not understand my instructions.  I told her that when the pt comes into the office 09/28/11 that we could discuss NTG and how to use this medication.  She agreed with plan.

## 2011-09-14 NOTE — Telephone Encounter (Signed)
New msg Pt's daughter called  She wants to know if she should have rx for nitroglycerin Which aspirin should she take baby aspirin or 325 mg

## 2011-09-22 ENCOUNTER — Telehealth: Payer: Self-pay | Admitting: Cardiology

## 2011-09-22 NOTE — Telephone Encounter (Signed)
FU Call: Pt calling again to speak with Lauren regarding pt swelling. Please return pt call to discuss further.

## 2011-09-22 NOTE — Telephone Encounter (Signed)
New Msg: Pt calling stating that her legs are swollen. Pt said legs have been swollen some since pt was d/c from hospital. Pt said yesterday they were worse and this morning legs were really swollen. Pt said other than L hand still tingling pt is not having any other symptoms. Please return pt call to discuss further.

## 2011-09-22 NOTE — Telephone Encounter (Signed)
I spoke with the pt and she has had some swelling in her lower extremities since leaving the hospital (right leg harvest site).  The pt has been up on her feet over the last few days and her swelling is more noticeable today.  The pt denies SOB.  The pt said that she took an extra Furosemide yesterday and today.  I made the pt aware that she needs to elevate her legs when sitting, avoid salt in the diet and weigh on a daily basis.  The pt agreed with plan. The pt also complains of left hand tingling and this could be nerve related. This symptom is improving and the pt said that Dr Laneta Simmers told her this could be related to the use of mammary artery for bypass.  I have instructed the pt to call the office if she does not notice improvement in her symptoms.  The pt agreed with plan.

## 2011-09-22 NOTE — Telephone Encounter (Signed)
No answer at home number x 2 attempts.

## 2011-09-28 ENCOUNTER — Other Ambulatory Visit: Payer: Self-pay | Admitting: *Deleted

## 2011-09-28 ENCOUNTER — Encounter: Payer: Self-pay | Admitting: Physician Assistant

## 2011-09-28 ENCOUNTER — Ambulatory Visit (INDEPENDENT_AMBULATORY_CARE_PROVIDER_SITE_OTHER): Payer: BC Managed Care – PPO | Admitting: Physician Assistant

## 2011-09-28 DIAGNOSIS — I2510 Atherosclerotic heart disease of native coronary artery without angina pectoris: Secondary | ICD-10-CM

## 2011-09-28 DIAGNOSIS — I509 Heart failure, unspecified: Secondary | ICD-10-CM

## 2011-09-28 DIAGNOSIS — I6529 Occlusion and stenosis of unspecified carotid artery: Secondary | ICD-10-CM

## 2011-09-28 DIAGNOSIS — G8918 Other acute postprocedural pain: Secondary | ICD-10-CM

## 2011-09-28 DIAGNOSIS — E785 Hyperlipidemia, unspecified: Secondary | ICD-10-CM | POA: Insufficient documentation

## 2011-09-28 DIAGNOSIS — Z951 Presence of aortocoronary bypass graft: Secondary | ICD-10-CM | POA: Insufficient documentation

## 2011-09-28 DIAGNOSIS — I5032 Chronic diastolic (congestive) heart failure: Secondary | ICD-10-CM | POA: Insufficient documentation

## 2011-09-28 DIAGNOSIS — E119 Type 2 diabetes mellitus without complications: Secondary | ICD-10-CM

## 2011-09-28 MED ORDER — POTASSIUM CHLORIDE CRYS ER 20 MEQ PO TBCR: 30.0000 meq | EXTENDED_RELEASE_TABLET | Freq: Every day | ORAL | Status: DC

## 2011-09-28 MED ORDER — HYDROCODONE-ACETAMINOPHEN 7.5-500 MG PO TABS: 1.0000 | ORAL_TABLET | ORAL | Status: DC | PRN

## 2011-09-28 MED ORDER — METOPROLOL TARTRATE 25 MG PO TABS: 25.0000 mg | ORAL_TABLET | Freq: Two times a day (BID) | ORAL | Status: DC

## 2011-09-28 MED ORDER — FUROSEMIDE 40 MG PO TABS: 60.0000 mg | ORAL_TABLET | Freq: Every day | ORAL | Status: DC

## 2011-09-28 NOTE — Progress Notes (Signed)
[address] [place], [place]  [address] Phone: [phone] Fax:  [phone]  Date:  09/28/2011   Name:  Felicia Rivas       DOB:  Feb 19, 1956 MRN:  [id number]  PCP:   Primary Cardiologist:  Dr. Olga Millers  Primary Electrophysiologist:  None    History of Present Illness: Felicia Rivas is a 56 y.o. female who presents for post hospital follow up.  She was admitted 12/12-12/24 With diastolic heart failure in the setting of NSTEMI and probable community-acquired pneumonia.  She was initially treated for community-acquired pneumonia.  She was seen in consultation by cardiology.  She ruled in for myocardial infarction.  She was diuresed for heart failure. LHC 09/02/11: LAD 70-80%, OM2 80%, small postero-lateral segment of the AV circumflex 60-70%, proximal to mid RCA 40%, acute margin 30%, 80% at the takeoff of the PDA.  Echocardiogram 09/01/11: Moderate LVH, EF 55-60%, mild LAE.  Given her three-vessel CAD, she was referred for CABG with Dr. Laneta Simmers.  CABG 09/07/11: LIMA-LAD, SVG-OM 2, SVG-PDA.  She remained in sinus rhythm postoperatively.  Labs: Potassium 4.2, creatinine 0.8, ALT 34, hemoglobin 10.4, TSH 1.017, A1c 8.1.    Doing well.  Chest is sore.  No fevers, chills.  Walking 10 mins or so a few times a day in her house.  Sleeps on 4-5 pillows and has done so for a long time.  Note LE edema.  No worse.  Weights stable.  No cough.  No syncope.  Eager to start Cardiac Rehab in [facility].  No longer smoking.  Would like to see a new PCP.  Past Medical History  Diagnosis Date  . Asthma   . Diabetes mellitus   . Sleep disturbance     Had sleep study several years ago, wasn't told she has OSA but told she needs O2 at night  . CAD (coronary artery disease)     NSTEMI 12/12:   LHC 09/02/11: LAD 70-80%, OM2 80%, small postero-lateral segment of the AV circumflex 60-70%, proximal to mid RCA 40%, acute margin 30%, 80% at the takeoff of the PDA.  . 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 Laneta Simmers;   LIMA-LAD, SVG-OM 2, SVG-PDA.  [name] Chronic diastolic heart failure     Echocardiogram 09/01/11: Moderate LVH, EF 55-60%, mild LAE.  [name] Carotid stenosis     dopplers 12/12: RICA 60-79%   . HLD (hyperlipidemia)     Current Outpatient Prescriptions  Medication Sig Dispense Refill  . albuterol (PROVENTIL) (2.5 MG/3ML) 0.083% nebulizer solution Take 2.5 mg by nebulization 2 (two) times daily as needed. For shortness of breath and wheezing       . aspirin EC 325 MG EC tablet Take 1 tablet (325 mg total) by mouth daily.  30 tablet    . budesonide-formoterol (SYMBICORT) 160-4.5 MCG/ACT inhaler Inhale 2 puffs into the lungs 2 (two) times daily as needed.       . furosemide (LASIX) 40 MG tablet Take 1 tablet (40 mg total) by mouth daily.  30 tablet  1  . insulin glargine (LANTUS) 100 UNIT/ML injection Inject 100 Units into the skin 2 (two) times daily.        . insulin lispro (HUMALOG) 100 UNIT/ML injection Inject 15 Units into the skin 3 (three) times daily before meals.        . metoprolol tartrate (LOPRESSOR) 25 MG tablet Take 1 tablet (25 mg total) by mouth 2 (two) times daily.  60  tablet  1  . Naproxen Sodium (ALEVE PO) Take by mouth daily.      . OXYCODONE HCL PO Take 5 mg by mouth as needed. 1-2 every six hours      . potassium chloride SA (K-DUR,KLOR-CON) 20 MEQ tablet Take 1 tablet (20 mEq total) by mouth daily.  30 tablet  1  . rosuvastatin (CRESTOR) 40 MG tablet Take 1 tablet (40 mg total) by mouth daily at 6 (six) AM.  30 tablet  0    Allergies: No Known Allergies  History  Substance Use Topics  . Smoking status: Former Smoker -- 0.5 packs/day    Types: Cigarettes  . Smokeless tobacco: Former Neurosurgeon    Quit date: 08/31/2011  . Alcohol Use: No     ROS:  Please see the history of present illness.   Having trouble sleeping.  Not interested in [place].  All other systems reviewed and negative.   PHYSICAL EXAM: VS:  BP 118/60  Pulse 70  Ht 5\' 7"  (1.702 m)  Wt 310 lb  12.8 oz (140.978 kg)  BMI 48.68 kg/m2 Well nourished, well developed, in no acute distress HEENT: normal Neck: cannot appreciate JVD Cardiac:  distant S1, S2; RRR; no murmur Chest: median sternotomy healing, no significant erythema or discharge Lungs:  Decreased breath sounds bilaterally, no wheezing, rhonchi or rales Abd: soft, nontender, no hepatomegaly Ext: trace to 1+ bilat edema; right wrist site without hematoma or bruit Skin: warm and dry Neuro:  CNs 2-12 intact, no focal abnormalities noted Psych: normal affect; in good spirits  EKG:   Sinus rhythm, heart rate 70, left axis deviation, T wave inversions in 1, aVL, poor R-wave progression, no change from prior tracing  ASSESSMENT AND PLAN:

## 2011-09-28 NOTE — Assessment & Plan Note (Signed)
She would like to see a [place] PCP now.  She also wants to see endocrinology.  She was seeing one in [place] previously.  Will refer her to Dr. Everardo All.

## 2011-09-28 NOTE — Assessment & Plan Note (Signed)
Repeat carotid dopplers in 6 mos.

## 2011-09-28 NOTE — Patient Instructions (Signed)
Your physician recommends that you schedule a follow-up appointment in: 6 weeks with Dr Jens Som Your physician recommends that you return for lab work in: today (BMP and BNP) and 1 week for a BMP Your physician has recommended you make the following change in your medication: INCREASE Furosemide to 1 1/2 tab daily and Potassium to 1 1/2 tab daily  You have been referred to Dr Everardo All for Endocrinology and Primary Care It is ok to start Cardiac Rehab

## 2011-09-28 NOTE — Assessment & Plan Note (Addendum)
Doing well post bypass.  Has follow up next week with Dr. Laneta Simmers.  She can start cardiac rehab.  Continue ASA.  Follow up with Dr. Olga Millers in 6 weeks.

## 2011-09-28 NOTE — Assessment & Plan Note (Signed)
She still has some volume to lose.  Check a bmet and bnp today.  Increase Lasix to 60 mg QD and K+ to 30 mEq QD.  Repeat bmet in one week.

## 2011-09-28 NOTE — Assessment & Plan Note (Signed)
Managed by PCP

## 2011-09-28 NOTE — Assessment & Plan Note (Signed)
She probably has OSA.  Will need to keep this in mind and eventually refer for sleep study.

## 2011-09-29 ENCOUNTER — Other Ambulatory Visit: Payer: Self-pay | Admitting: Surgery

## 2011-09-29 ENCOUNTER — Other Ambulatory Visit: Payer: Self-pay | Admitting: *Deleted

## 2011-09-29 DIAGNOSIS — I2510 Atherosclerotic heart disease of native coronary artery without angina pectoris: Secondary | ICD-10-CM

## 2011-09-29 DIAGNOSIS — G8918 Other acute postprocedural pain: Secondary | ICD-10-CM

## 2011-09-29 LAB — BASIC METABOLIC PANEL
BUN: 12 mg/dL (ref 6–23)
Calcium: 8.9 mg/dL (ref 8.4–10.5)
Chloride: 104 mEq/L (ref 96–112)
Creatinine, Ser: 0.7 mg/dL (ref 0.4–1.2)

## 2011-09-29 MED ORDER — HYDROCODONE-ACETAMINOPHEN 7.5-500 MG PO TABS: 1.0000 | ORAL_TABLET | ORAL | Status: DC | PRN

## 2011-09-30 ENCOUNTER — Encounter: Payer: Self-pay | Admitting: *Deleted

## 2011-09-30 NOTE — Telephone Encounter (Signed)
09/30/11--0930a--gave pt lab results and told her to increase her KCL to qd--pt states at last visit s weaver increased her KCL to qd--along with increase in lasix --advised to continue with as scott advised  i will speak to scott and confirm dosage--also pt sched for repeat bmet on the 15th of this month---pt agrees--nt

## 2011-09-30 NOTE — Telephone Encounter (Signed)
This encounter was created in error - please disregard.

## 2011-10-04 ENCOUNTER — Other Ambulatory Visit: Payer: BC Managed Care – PPO | Admitting: *Deleted

## 2011-10-04 ENCOUNTER — Ambulatory Visit (INDEPENDENT_AMBULATORY_CARE_PROVIDER_SITE_OTHER): Payer: Self-pay | Admitting: Surgery

## 2011-10-04 ENCOUNTER — Encounter: Payer: Self-pay | Admitting: Surgery

## 2011-10-04 ENCOUNTER — Ambulatory Visit
Admission: RE | Admit: 2011-10-04 | Discharge: 2011-10-04 | Disposition: A | Discharge status: Home / Self Care | Payer: BC Managed Care – PPO | Source: Ambulatory Visit | Attending: Surgery | Admitting: Surgery

## 2011-10-04 VITALS — BP 158/73 | HR 84 | Resp 20 | Ht 67.0 in | Wt 310.0 lb

## 2011-10-04 DIAGNOSIS — Z951 Presence of aortocoronary bypass graft: Secondary | ICD-10-CM

## 2011-10-04 DIAGNOSIS — I2510 Atherosclerotic heart disease of native coronary artery without angina pectoris: Secondary | ICD-10-CM

## 2011-10-04 NOTE — Progress Notes (Signed)
                     [address]            [name] [address]          [phone]      HPI:  Patient returns for routine postoperative follow-up having undergone coronary bypass graft surgery x3 on 09/07/2011. The patient's early postoperative recovery while in the hospital was notable for an uncomplicated postoperative course. Since hospital discharge the patient reports that she has continued to improve. She is walking daily without chest pain or shortness of breath. She is anxious to return to work.   Current Outpatient Prescriptions  Medication Sig Dispense Refill  . albuterol (PROVENTIL) (2.5 MG/3ML) 0.083% nebulizer solution Take 2.5 mg by nebulization 2 (two) times daily as needed. For shortness of breath and wheezing       . aspirin EC 325 MG EC tablet Take 1 tablet (325 mg total) by mouth daily.  30 tablet    . budesonide-formoterol (SYMBICORT) 160-4.5 MCG/ACT inhaler Inhale 2 puffs into the lungs 2 (two) times daily as needed.       . furosemide (LASIX) 40 MG tablet Take 1.5 tablets (60 mg total) by mouth daily.  135 tablet  6  . HYDROcodone-acetaminophen (LORTAB) 7.5-500 MG per tablet Take 1 tablet by mouth every 4 (four) hours as needed. May take one or two tabs q4-6 hrs prn  40 tablet  0  . insulin glargine (LANTUS) 100 UNIT/ML injection Inject 100 Units into the skin 2 (two) times daily.        . insulin lispro (HUMALOG) 100 UNIT/ML injection Inject 15 Units into the skin 3 (three) times daily before meals.        . metoprolol tartrate (LOPRESSOR) 25 MG tablet Take 1 tablet (25 mg total) by mouth 2 (two) times daily.  180 tablet  6  . Naproxen Sodium (ALEVE PO) Take by mouth daily.      . potassium chloride SA (K-DUR,KLOR-CON) 20 MEQ tablet Take 1.5 tablets (30 mEq total) by mouth daily.  135 tablet  6  . rosuvastatin (CRESTOR) 40 MG tablet Take 1 tablet (40 mg total) by mouth daily at 6 (six) AM.  30 tablet  0  . OXYCODONE HCL PO Take 5 mg by mouth as  needed. 1-2 every six hours        Physical Exam:  BP 158/73  Pulse 84  Resp 20  Ht 5\' 7"  (1.702 m)  Wt 310 lb (140.615 kg)  BMI 48.55 kg/m2  SpO2 95%  She looks well. Cardiac exam shows a regular rate and rhythm with normal heart sounds. Lung exam is clear. Chest incision is healing well and sternum is stable. Her leg incision is healing well and there is no peripheral edema.  Diagnostic Tests:  Chest x-ray today shows clear lung fields and no pleural effusions.  Impression:  Overall she continues to do well following surgery. 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 She seems very motivated to modify her cardiac risk factors.  Plan:  She will follow up with cardiology and will contact me if she develops any problems with her incisions.

## 2011-10-05 ENCOUNTER — Other Ambulatory Visit (INDEPENDENT_AMBULATORY_CARE_PROVIDER_SITE_OTHER): Payer: BC Managed Care – PPO | Admitting: *Deleted

## 2011-10-05 DIAGNOSIS — I5032 Chronic diastolic (congestive) heart failure: Secondary | ICD-10-CM

## 2011-10-05 DIAGNOSIS — I509 Heart failure, unspecified: Secondary | ICD-10-CM

## 2011-10-05 DIAGNOSIS — I6529 Occlusion and stenosis of unspecified carotid artery: Secondary | ICD-10-CM

## 2011-10-05 DIAGNOSIS — I2510 Atherosclerotic heart disease of native coronary artery without angina pectoris: Secondary | ICD-10-CM

## 2011-10-05 LAB — BASIC METABOLIC PANEL
BUN: 17 mg/dL (ref 6–23)
CO2: 30 mEq/L (ref 19–32)
Calcium: 9.2 mg/dL (ref 8.4–10.5)
GFR: 94.92 mL/min (ref >60.00)
Glucose, Bld: 205 mg/dL — ABNORMAL HIGH (ref 70–99)

## 2011-10-13 ENCOUNTER — Telehealth: Payer: Self-pay | Admitting: Cardiology

## 2011-10-13 ENCOUNTER — Other Ambulatory Visit: Payer: Self-pay | Admitting: *Deleted

## 2011-10-13 DIAGNOSIS — G8918 Other acute postprocedural pain: Secondary | ICD-10-CM

## 2011-10-13 MED ORDER — HYDROCODONE-ACETAMINOPHEN 7.5-500 MG PO TABS: 1.0000 | ORAL_TABLET | ORAL | Status: DC | PRN

## 2011-10-13 NOTE — Telephone Encounter (Signed)
Pt needs letter mailed to her re rtn to work for 1/2 days, wants dr Riley Kill to determine for how long, if any questions can cakll [phone]

## 2011-10-13 NOTE — Telephone Encounter (Signed)
I spoke with the pt and she would like to RTW on 10/24/11 and work only 12pm-4pm at the beginning. The pt is not sure how long she needs to do this and will leave this to Dr Rosalyn Charters discretion.  She will call me back with a fax number to send work note to when complete.

## 2011-10-18 ENCOUNTER — Ambulatory Visit (INDEPENDENT_AMBULATORY_CARE_PROVIDER_SITE_OTHER): Payer: BC Managed Care – PPO | Admitting: Endocrinology

## 2011-10-18 ENCOUNTER — Encounter: Payer: Self-pay | Admitting: *Deleted

## 2011-10-18 ENCOUNTER — Telehealth: Payer: Self-pay | Admitting: Cardiology

## 2011-10-18 ENCOUNTER — Encounter: Payer: Self-pay | Admitting: Endocrinology

## 2011-10-18 DIAGNOSIS — H3500 Unspecified background retinopathy: Secondary | ICD-10-CM

## 2011-10-18 NOTE — Progress Notes (Signed)
Subjective:    Patient ID: Felicia Rivas, female    DOB: Dec 17, 1955, 56 y.o.   MRN: [id number]  HPI pt states 10 years h/o dm.  she has several chronic complications.  he has been on insulin x 5 years.  pt says her diet and exercise are not good.  She says cbg's vary from 69-300.  It is in general higher as the day goes on.  She had cabg 6 weeks ago.  She has few weeks of slight low-back pain, but no assoc numbness.   Past Medical History  Diagnosis Date  . Asthma   . Diabetes mellitus   . Sleep disturbance     Had sleep study several years ago, wasn't told she has OSA but told she needs O2 at night  . CAD (coronary artery disease)     NSTEMI 12/12:   LHC 09/02/11: LAD 70-80%, OM2 80%, small postero-lateral segment of the AV circumflex 60-70%, proximal to mid RCA 40%, acute margin 30%, 80% at the takeoff of the PDA.  . 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 Laneta Simmers;   LIMA-LAD, SVG-OM 2, SVG-PDA.  [name] Chronic diastolic heart failure     Echocardiogram 09/01/11: Moderate LVH, EF 55-60%, mild LAE.  [name] Carotid stenosis     dopplers 12/12: RICA 60-79%   . HLD (hyperlipidemia)   . History of chicken pox   . Stroke     Past Surgical History  Procedure Date  . Cholecystectomy   . Coronary artery bypass graft 09/07/2011    Procedure: CORONARY ARTERY BYPASS GRAFTING (CABG);  Surgeon: Alleen Borne, MD;  Location: [facility] OR;  Service: Open Heart Surgery;  Laterality: N/A;    History   Social History  . Marital Status: Married    Spouse Name: N/A    Number of Children: 3  . Years of Education: 16   Occupational History  . Teacher    Social History Main Topics  . Smoking status: Former Smoker -- 0.5 packs/day    Types: Cigarettes  . Smokeless tobacco: Former Neurosurgeon    Quit date: 08/31/2011  . Alcohol Use: No  . Drug Use: No  . Sexually Active: No   Other Topics Concern  . Not on file   Social History Narrative   Regular exercise-yes    Current Outpatient Prescriptions on File Prior to  Visit  Medication Sig Dispense Refill  . albuterol (PROVENTIL) (2.5 MG/3ML) 0.083% nebulizer solution Take 2.5 mg by nebulization 2 (two) times daily as needed. For shortness of breath and wheezing       . aspirin EC 325 MG EC tablet Take 1 tablet (325 mg total) by mouth daily.  30 tablet    . budesonide-formoterol (SYMBICORT) 160-4.5 MCG/ACT inhaler Inhale 2 puffs into the lungs 2 (two) times daily as needed.       . furosemide (LASIX) 40 MG tablet Take 1.5 tablets (60 mg total) by mouth daily.  135 tablet  6  . HYDROcodone-acetaminophen (LORTAB) 7.5-500 MG per tablet Take 1 tablet by mouth every 4 (four) hours as needed. May take one or two tabs q4-6 hrs prn  40 tablet  0  . insulin glargine (LANTUS) 100 UNIT/ML injection Inject 100 Units into the skin 2 (two) times daily.        . insulin lispro (HUMALOG) 100 UNIT/ML injection Inject 15 Units into the skin 3 (three) times daily before meals.        . metoprolol tartrate (LOPRESSOR) 25 MG  tablet Take 1 tablet (25 mg total) by mouth 2 (two) times daily.  180 tablet  6  . Naproxen Sodium (ALEVE PO) Take by mouth daily.      . OXYCODONE HCL PO Take 5 mg by mouth as needed. 1-2 every six hours      . potassium chloride SA (K-DUR,KLOR-CON) 20 MEQ tablet Take 1.5 tablets (30 mEq total) by mouth daily.  135 tablet  6  . rosuvastatin (CRESTOR) 40 MG tablet Take 1 tablet (40 mg total) by mouth daily at 6 (six) AM.  30 tablet  0    No Known Allergies  Family History  Problem Relation Age of Onset  . Cancer Mother   . Heart failure Mother     Also has hx MVP s/p MVR  . Diabetes Mother   . Coronary artery disease Father     Died of MI at age 52  . Heart disease Other     Parent   BP 122/72  Pulse 67  Temp(Src) 97.8 F (36.6 C) (Oral)  Ht 5\' 7"  (1.702 m)  Wt 305 lb 12.8 oz (138.71 kg)  BMI 47.90 kg/m2  SpO2 96%  Review of Systems denies sob, urinary frequency, cramps, excessive diaphoresis, memory loss, depression, menopausal sxs, and  easy  bruising.  She has slight nausea.  Chest wound pain is steadily improving.  She has lost 40 lbs recently.   She has chronic blurry vision and headache.    Objective:   Physical Exam VS: see vs page GEN: no distress.  obese HEAD: head: no deformity eyes: no periorbital swelling, no proptosis external nose and ears are normal mouth: no lesion seen NECK: supple, thyroid is not enlarged CHEST WALL: no deformity.  Median sternotomy site is healing well.   LUNGS:  Clear to auscultation CV: reg rate and rhythm, no murmur ABD: abdomen is soft, nontender.  no hepatosplenomegaly.  not distended.  Midline ventral hernia.   MUSCULOSKELETAL: muscle bulk and strength are grossly normal.  no obvious joint swelling.  gait is normal and steady EXTEMITIES: no deformity.  no ulcer on the feet.  feet are of normal color and temp.  Trace bilat leg edema. There is bilateral onychomycosis, varicosities, and calluses.  Vein harvest site at the right leg is healing well. PULSES: dorsalis pedis intact bilat.  There is a slight left carotid bruit NEURO:  cn 2-12 grossly intact.   readily moves all 4's.  sensation is intact to touch on the feet SKIN:  Normal texture and temperature.  No rash or suspicious lesion is visible.   NODES:  None palpable at the neck PSYCH: alert, oriented x3.  Does not appear anxious nor depressed.  Lab Results  Component Value Date   HGBA1C 8.1* 09/01/2011      Assessment & Plan:  DM, needs increased rx.  Based on the pattern of her cbg's, she needs some adjustment in her therapy Cad:  In this context, she should avoid hypoglycemia CHF:  In this context, she should avoid hypoglycemia. Morbid obesity.  This complicates the rx of DM.  However, this is improved recently

## 2011-10-18 NOTE — Telephone Encounter (Signed)
Ok to return to work; no lifting anything over 10 pounds for 3 months post CABG [facility]

## 2011-10-18 NOTE — Telephone Encounter (Signed)
New problem Pt said she needs a note faxed to her HR dept [phone] saying she could only work half days from 11-4  Please call her back if you have questions

## 2011-10-18 NOTE — Telephone Encounter (Signed)
Pt made aware

## 2011-10-18 NOTE — Telephone Encounter (Signed)
Note faxed to number provided.

## 2011-10-18 NOTE — Telephone Encounter (Signed)
Will forward for dr crenshaw review  

## 2011-10-18 NOTE — Patient Instructions (Addendum)
good diet and exercise habits significanly improve the control of your diabetes.  please let me know if you wish to be referred to a dietician.  high blood sugar is very risky to your health.  you should see an eye doctor every year. controlling your blood pressure and cholesterol drastically reduces the damage diabetes does to your body.  this also applies to quitting smoking.  please discuss these with your doctor.  you should take an aspirin every day, unless you have been advised by a doctor not to. check your blood sugar 2 times a day.  vary the time of day when you check, between before the 3 meals, and at bedtime.  also check if you have symptoms of your blood sugar being too high or too low.  please keep a record of the readings and bring it to your next appointment here.  please call us sooner if your blood sugar goes below 70, or if it stays over 200. we will need to take this complex situation in stages.  Please come back for a follow-up appointment in 2 weeks For now, reduce lantus to 80 unit 2x a day, and:  Increase humalog to 3x a day (just before each meal), 30-20-30 units.

## 2011-10-18 NOTE — Telephone Encounter (Signed)
I spoke with Dr Riley Kill and he said that this message should actually be addressed by Dr Jens Som (Primary Cardiologist).  Dr Jens Som did hospital consult and Dr Riley Kill did cardiac cath.  This patient then went to CABG. I will forward this information to Stanton Kidney RN to discuss with Dr Jens Som.

## 2011-10-25 ENCOUNTER — Telehealth: Payer: Self-pay | Admitting: Cardiology

## 2011-10-25 ENCOUNTER — Encounter: Payer: Self-pay | Admitting: *Deleted

## 2011-10-25 NOTE — Telephone Encounter (Signed)
Spoke with pt, she needs a note to return to work with no restrictions on Friday 11-04-11. Note faxed to the number provided.

## 2011-10-25 NOTE — Telephone Encounter (Signed)
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

## 2011-11-01 ENCOUNTER — Ambulatory Visit: Payer: BC Managed Care – PPO | Admitting: Endocrinology

## 2011-11-01 ENCOUNTER — Ambulatory Visit (INDEPENDENT_AMBULATORY_CARE_PROVIDER_SITE_OTHER): Payer: Self-pay | Admitting: Surgery

## 2011-11-01 VITALS — BP 143/71 | HR 76 | Resp 16 | Ht 67.0 in | Wt 300.0 lb

## 2011-11-01 DIAGNOSIS — I2510 Atherosclerotic heart disease of native coronary artery without angina pectoris: Secondary | ICD-10-CM

## 2011-11-01 DIAGNOSIS — L0390 Cellulitis, unspecified: Secondary | ICD-10-CM

## 2011-11-01 DIAGNOSIS — Z09 Encounter for follow-up examination after completed treatment for conditions other than malignant neoplasm: Secondary | ICD-10-CM

## 2011-11-01 NOTE — Progress Notes (Signed)
HPI:  The patient returns today for examination of her right leg vein harvest site status post coronary bypass graft surgery on 09/07/2011. She does have a history of morbid obesity and diabetes. On her postoperative visit last month the right leg incision was healing well and there was no abnormality. She reports that on Saturday of this past week she started noticing redness and some tenderness along the medial aspect of her right knee area. This worsened over the next couple days. She has been able to ambulate but said that it is sore. She's had no fever or chills.  Current Outpatient Prescriptions  Medication Sig Dispense Refill  . albuterol (PROVENTIL) (2.5 MG/3ML) 0.083% nebulizer solution Take 2.5 mg by nebulization 2 (two) times daily as needed. For shortness of breath and wheezing       . aspirin EC 325 MG EC tablet Take 1 tablet (325 mg total) by mouth daily.  30 tablet    . budesonide-formoterol (SYMBICORT) 160-4.5 MCG/ACT inhaler Inhale 2 puffs into the lungs 2 (two) times daily as needed.       . furosemide (LASIX) 40 MG tablet Take 1.5 tablets (60 mg total) by mouth daily.  135 tablet  6  . HYDROcodone-acetaminophen (LORTAB) 7.5-500 MG per tablet Take 1 tablet by mouth every 4 (four) hours as needed. May take one or two tabs q4-6 hrs prn  40 tablet  0  . ibuprofen (ADVIL,MOTRIN) 200 MG tablet Take 200 mg by mouth as needed.      . insulin glargine (LANTUS) 100 UNIT/ML injection Inject 80 Units into the skin 2 (two) times daily.       . insulin lispro (HUMALOG) 100 UNIT/ML injection 3x a day (just before each meal) 30-20-30 units      . metoprolol tartrate (LOPRESSOR) 25 MG tablet Take 1 tablet (25 mg total) by mouth 2 (two) times daily.  180 tablet  6  . potassium chloride SA (K-DUR,KLOR-CON) 20 MEQ tablet Take 1.5 tablets (30 mEq total) by mouth daily.  135 tablet  6  . rosuvastatin (CRESTOR) 40 MG tablet Take 1 tablet (40 mg total) by mouth daily at 6 (six) AM.  30 tablet  0  .  Naproxen Sodium (ALEVE PO) Take by mouth daily.         Physical Exam:  BP 143/71  Pulse 76  Resp 16  Ht 5\' 7"  (1.702 m)  Wt 300 lb (136.079 kg)  BMI 46.99 kg/m2  SpO2 98% She looks well. Cardiac exam shows a regular rate and rhythm with normal heart sounds. Lung exam is clear. The chest incision is healing well and sternum is stable. The right leg vein harvest incisions are intact without drainage. There is some firmness around the medial right knee with mild tenderness. There is erythema of the skin extending from the lower right thigh down to the ankle along the medial aspect of the leg. There is mild right lower extremity edema to the knee.    Impression:  She has developed some cellulitis along the medial aspect of her right leg at the vein harvest site. It is unusual to develop it this late after surgery but not unheard of. There is no sign of abscess formation and no fluctuance or drainage from the incisions. She has no fever or chills and therefore we'll start her on Keflex 500 milligrams every 6 hours x10 days. Plan have her return in one week for followup. I told her that if the cellulitis has not started  improving over the next 2 days then she should call our office later this week so that she can be evaluated. If she develops any fever or chills she is to call us immediately.  Plan:  Followup in our office in one week.

## 2011-11-03 ENCOUNTER — Encounter: Payer: Self-pay | Admitting: Thoracic Surgery (Cardiothoracic Vascular Surgery)

## 2011-11-03 ENCOUNTER — Ambulatory Visit: Payer: BC Managed Care – PPO | Admitting: *Deleted

## 2011-11-03 ENCOUNTER — Encounter (HOSPITAL_COMMUNITY): Payer: Self-pay | Admitting: General Practice

## 2011-11-03 ENCOUNTER — Inpatient Hospital Stay (HOSPITAL_COMMUNITY)
Admission: AD | Admit: 2011-11-03 | Discharge: 2011-11-06 | DRG: 580 | Disposition: A | Discharge status: Home Health | Payer: BC Managed Care – PPO | Source: Ambulatory Visit | Attending: Surgery | Admitting: Surgery

## 2011-11-03 ENCOUNTER — Ambulatory Visit (INDEPENDENT_AMBULATORY_CARE_PROVIDER_SITE_OTHER): Payer: Self-pay | Admitting: *Deleted

## 2011-11-03 VITALS — BP 143/75 | HR 72 | Temp 97.9°F | Resp 16 | Ht 67.0 in | Wt 310.0 lb

## 2011-11-03 DIAGNOSIS — Z79899 Other long term (current) drug therapy: Secondary | ICD-10-CM

## 2011-11-03 DIAGNOSIS — I6529 Occlusion and stenosis of unspecified carotid artery: Secondary | ICD-10-CM | POA: Diagnosis present

## 2011-11-03 DIAGNOSIS — Z7982 Long term (current) use of aspirin: Secondary | ICD-10-CM

## 2011-11-03 DIAGNOSIS — L03119 Cellulitis of unspecified part of limb: Secondary | ICD-10-CM

## 2011-11-03 DIAGNOSIS — E119 Type 2 diabetes mellitus without complications: Secondary | ICD-10-CM | POA: Diagnosis present

## 2011-11-03 DIAGNOSIS — G479 Sleep disorder, unspecified: Secondary | ICD-10-CM | POA: Diagnosis present

## 2011-11-03 DIAGNOSIS — Z951 Presence of aortocoronary bypass graft: Secondary | ICD-10-CM

## 2011-11-03 DIAGNOSIS — T8140XA Infection following a procedure, unspecified, initial encounter: Principal | ICD-10-CM | POA: Diagnosis present

## 2011-11-03 DIAGNOSIS — E669 Obesity, unspecified: Secondary | ICD-10-CM | POA: Diagnosis present

## 2011-11-03 DIAGNOSIS — I252 Old myocardial infarction: Secondary | ICD-10-CM

## 2011-11-03 DIAGNOSIS — L02419 Cutaneous abscess of limb, unspecified: Secondary | ICD-10-CM | POA: Diagnosis present

## 2011-11-03 DIAGNOSIS — T8149XA Infection following a procedure, other surgical site, initial encounter: Secondary | ICD-10-CM | POA: Diagnosis present

## 2011-11-03 DIAGNOSIS — E785 Hyperlipidemia, unspecified: Secondary | ICD-10-CM | POA: Diagnosis present

## 2011-11-03 DIAGNOSIS — I2510 Atherosclerotic heart disease of native coronary artery without angina pectoris: Secondary | ICD-10-CM | POA: Diagnosis present

## 2011-11-03 DIAGNOSIS — Z794 Long term (current) use of insulin: Secondary | ICD-10-CM

## 2011-11-03 DIAGNOSIS — Z87891 Personal history of nicotine dependence: Secondary | ICD-10-CM

## 2011-11-03 DIAGNOSIS — B379 Candidiasis, unspecified: Secondary | ICD-10-CM | POA: Diagnosis not present

## 2011-11-03 DIAGNOSIS — Z6841 Body Mass Index (BMI) 40.0 and over, adult: Secondary | ICD-10-CM

## 2011-11-03 DIAGNOSIS — J45909 Unspecified asthma, uncomplicated: Secondary | ICD-10-CM | POA: Diagnosis present

## 2011-11-03 DIAGNOSIS — Z8673 Personal history of transient ischemic attack (TIA), and cerebral infarction without residual deficits: Secondary | ICD-10-CM

## 2011-11-03 DIAGNOSIS — Y832 Surgical operation with anastomosis, bypass or graft as the cause of abnormal reaction of the patient, or of later complication, without mention of misadventure at the time of the procedure: Secondary | ICD-10-CM | POA: Diagnosis present

## 2011-11-03 DIAGNOSIS — I5032 Chronic diastolic (congestive) heart failure: Secondary | ICD-10-CM | POA: Diagnosis present

## 2011-11-03 HISTORY — DX: Adverse effect of unspecified anesthetic, initial encounter: T41.45XA

## 2011-11-03 HISTORY — DX: Other complications of anesthesia, initial encounter: T88.59XA

## 2011-11-03 LAB — DIFFERENTIAL
Basophils Relative: 0 % (ref 0–1)
Lymphocytes Relative: 18 % (ref 12–46)
Lymphs Abs: 2.6 10*3/uL (ref 0.7–4.0)
Monocytes Absolute: 0.7 10*3/uL (ref 0.1–1.0)
Monocytes Relative: 5 % (ref 3–12)
Neutro Abs: 10.8 10*3/uL — ABNORMAL HIGH (ref 1.7–7.7)

## 2011-11-03 LAB — CBC
MCV: 84.7 fL (ref 78.0–100.0)
Platelets: 259 10*3/uL (ref 150–400)
RDW: 14.3 % (ref 11.5–15.5)
WBC: 14.5 10*3/uL — ABNORMAL HIGH (ref 4.0–10.5)

## 2011-11-03 LAB — GLUCOSE, CAPILLARY
Glucose-Capillary: 245 mg/dL — ABNORMAL HIGH (ref 70–99)
Glucose-Capillary: 222 mg/dL — ABNORMAL HIGH (ref 70–99)

## 2011-11-03 LAB — COMPREHENSIVE METABOLIC PANEL
AST: 12 U/L (ref 0–37)
Albumin: 2.9 g/dL — ABNORMAL LOW (ref 3.5–5.2)
Chloride: 99 mEq/L (ref 96–112)
Creatinine, Ser: 0.64 mg/dL (ref 0.50–1.10)
Total Bilirubin: 0.2 mg/dL — ABNORMAL LOW (ref 0.3–1.2)

## 2011-11-03 MED ORDER — INSULIN ASPART 100 UNIT/ML ~~LOC~~ SOLN
0.0000 [IU] | Freq: Three times a day (TID) | SUBCUTANEOUS | Status: DC
  Administered 2011-11-03: 7 [IU] via SUBCUTANEOUS
  Administered 2011-11-04: 4 [IU] via SUBCUTANEOUS
  Administered 2011-11-04: 3 [IU] via SUBCUTANEOUS
  Administered 2011-11-04: 4 [IU] via SUBCUTANEOUS
  Filled 2011-11-03: qty 3

## 2011-11-03 MED ORDER — FUROSEMIDE 40 MG PO TABS
60.0000 mg | ORAL_TABLET | Freq: Every day | ORAL | Status: DC
  Administered 2011-11-04 – 2011-11-06 (×3): 60 mg via ORAL
  Filled 2011-11-03 (×3): qty 1

## 2011-11-03 MED ORDER — INSULIN ASPART 100 UNIT/ML ~~LOC~~ SOLN
30.0000 [IU] | Freq: Two times a day (BID) | SUBCUTANEOUS | Status: DC
  Filled 2011-11-03: qty 3

## 2011-11-03 MED ORDER — VANCOMYCIN HCL 1000 MG IV SOLR
2000.0000 mg | Freq: Once | INTRAVENOUS | Status: AC
  Administered 2011-11-03: 2000 mg via INTRAVENOUS
  Filled 2011-11-03: qty 2000

## 2011-11-03 MED ORDER — ACETAMINOPHEN 325 MG PO TABS: 650.0000 mg | ORAL_TABLET | Freq: Four times a day (QID) | ORAL | Status: DC | PRN

## 2011-11-03 MED ORDER — SODIUM CHLORIDE 0.9 % IJ SOLN
3.0000 mL | Freq: Two times a day (BID) | INTRAMUSCULAR | Status: DC
  Administered 2011-11-05 – 2011-11-06 (×3): 3 mL via INTRAVENOUS

## 2011-11-03 MED ORDER — IBUPROFEN 800 MG PO TABS
800.0000 mg | ORAL_TABLET | Freq: Two times a day (BID) | ORAL | Status: DC | PRN
  Filled 2011-11-03: qty 1

## 2011-11-03 MED ORDER — ENOXAPARIN SODIUM 30 MG/0.3ML ~~LOC~~ SOLN
30.0000 mg | SUBCUTANEOUS | Status: DC
  Administered 2011-11-03: 30 mg via SUBCUTANEOUS
  Filled 2011-11-03 (×2): qty 0.3

## 2011-11-03 MED ORDER — POTASSIUM CHLORIDE CRYS ER 20 MEQ PO TBCR
30.0000 meq | EXTENDED_RELEASE_TABLET | Freq: Every day | ORAL | Status: DC
  Filled 2011-11-03: qty 1

## 2011-11-03 MED ORDER — MAGNESIUM HYDROXIDE 400 MG/5ML PO SUSP: 30.0000 mL | Freq: Every day | ORAL | Status: DC | PRN

## 2011-11-03 MED ORDER — ONDANSETRON HCL 4 MG/2ML IJ SOLN: 4.0000 mg | Freq: Four times a day (QID) | INTRAMUSCULAR | Status: DC | PRN

## 2011-11-03 MED ORDER — ADULT MULTIVITAMIN W/MINERALS CH
1.0000 | ORAL_TABLET | Freq: Every day | ORAL | Status: DC
  Administered 2011-11-04 – 2011-11-06 (×3): 1 via ORAL
  Filled 2011-11-03 (×3): qty 1

## 2011-11-03 MED ORDER — INSULIN ASPART 100 UNIT/ML ~~LOC~~ SOLN: 20.0000 [IU] | Freq: Every day | SUBCUTANEOUS | Status: DC

## 2011-11-03 MED ORDER — OXYCODONE HCL 5 MG PO TABS
5.0000 mg | ORAL_TABLET | ORAL | Status: DC | PRN
  Administered 2011-11-04 – 2011-11-05 (×3): 5 mg via ORAL
  Filled 2011-11-03 (×3): qty 1

## 2011-11-03 MED ORDER — INSULIN GLARGINE 100 UNIT/ML ~~LOC~~ SOLN
80.0000 [IU] | Freq: Two times a day (BID) | SUBCUTANEOUS | Status: DC
  Administered 2011-11-03 – 2011-11-06 (×6): 80 [IU] via SUBCUTANEOUS
  Filled 2011-11-03 (×2): qty 3

## 2011-11-03 MED ORDER — METOPROLOL TARTRATE 25 MG PO TABS
25.0000 mg | ORAL_TABLET | Freq: Two times a day (BID) | ORAL | Status: DC
  Administered 2011-11-03 – 2011-11-06 (×6): 25 mg via ORAL
  Filled 2011-11-03 (×7): qty 1

## 2011-11-03 MED ORDER — ROSUVASTATIN CALCIUM 40 MG PO TABS
40.0000 mg | ORAL_TABLET | Freq: Every day | ORAL | Status: DC
  Administered 2011-11-04 – 2011-11-05 (×2): 40 mg via ORAL
  Filled 2011-11-03 (×3): qty 1

## 2011-11-03 MED ORDER — ASPIRIN EC 325 MG PO TBEC
325.0000 mg | DELAYED_RELEASE_TABLET | Freq: Every day | ORAL | Status: DC
  Administered 2011-11-04 – 2011-11-06 (×3): 325 mg via ORAL
  Filled 2011-11-03 (×3): qty 1

## 2011-11-03 MED ORDER — ACETAMINOPHEN 650 MG RE SUPP: 650.0000 mg | Freq: Four times a day (QID) | RECTAL | Status: DC | PRN

## 2011-11-03 MED ORDER — VANCOMYCIN HCL IN DEXTROSE 1-5 GM/200ML-% IV SOLN: 1000.0000 mg | Freq: Two times a day (BID) | INTRAVENOUS | Status: DC

## 2011-11-03 MED ORDER — SODIUM CHLORIDE 0.9 % IJ SOLN: 3.0000 mL | INTRAMUSCULAR | Status: DC | PRN

## 2011-11-03 MED ORDER — VANCOMYCIN HCL 1000 MG IV SOLR
2000.0000 mg | Freq: Two times a day (BID) | INTRAVENOUS | Status: DC
  Administered 2011-11-04 – 2011-11-05 (×3): 2000 mg via INTRAVENOUS
  Filled 2011-11-03 (×5): qty 2000

## 2011-11-03 MED ORDER — SODIUM CHLORIDE 0.9 % IV SOLN
250.0000 mL | INTRAVENOUS | Status: DC | PRN
  Administered 2011-11-03: 250 mL via INTRAVENOUS

## 2011-11-03 MED ORDER — SODIUM CHLORIDE 0.9 % IJ SOLN
3.0000 mL | Freq: Two times a day (BID) | INTRAMUSCULAR | Status: DC
  Administered 2011-11-03 – 2011-11-05 (×4): 3 mL via INTRAVENOUS

## 2011-11-03 MED ORDER — ALUM & MAG HYDROXIDE-SIMETH 200-200-20 MG/5ML PO SUSP: 30.0000 mL | Freq: Four times a day (QID) | ORAL | Status: DC | PRN

## 2011-11-03 MED ORDER — DOCUSATE SODIUM 100 MG PO CAPS
100.0000 mg | ORAL_CAPSULE | Freq: Two times a day (BID) | ORAL | Status: DC
  Administered 2011-11-03 – 2011-11-06 (×5): 100 mg via ORAL
  Filled 2011-11-03 (×7): qty 1

## 2011-11-03 MED ORDER — ONDANSETRON HCL 4 MG PO TABS: 4.0000 mg | ORAL_TABLET | Freq: Four times a day (QID) | ORAL | Status: DC | PRN

## 2011-11-03 MED ORDER — INSULIN LISPRO 100 UNIT/ML ~~LOC~~ SOLN: 20.0000 [IU] | Freq: Three times a day (TID) | SUBCUTANEOUS | Status: DC

## 2011-11-03 MED ORDER — ZOLPIDEM TARTRATE 5 MG PO TABS: 5.0000 mg | ORAL_TABLET | Freq: Every evening | ORAL | Status: DC | PRN

## 2011-11-03 NOTE — Progress Notes (Signed)
Pt quit smoking as of 2 months ago and has remained tobacco free. Congratulated and encouraged pt to remain tobacco free. Discussed relapse prevention strategies. Reviewed and gave pt education/contact information.

## 2011-11-03 NOTE — Progress Notes (Unsigned)
I had informed Dr. Laneta Simmers that her leg was now draining and felt she needed to be re-evaluated and would have Dr. Dorris Fetch see her to determine if she needs to be admitted.  This was done and she is going to be admitted with cellulitis/abscess of the right lower leg.

## 2011-11-03 NOTE — H&P (Signed)
[address]            Felicia Rivas [address]          [phone]     Felicia Rivas is an 56 y.o. female.  @bday  LKG:[id number] Chief Complaint: Right leg endoscopic vein harvest site wound infection HPI: The patient is a 56 year old female who underwent coronary artery bypass grafting x3 on 09/07/2011 by Dr. Evelene Rivas. She had a non-ST segment elevation myocardial infarction on December 12 and was found to have severe three-vessel coronary artery disease. After discharge she has done quite well. She has lost 45 pounds primarily through diet. She was last seen in the office 2 days ago by Dr. Laneta Rivas at which time she she was noted to have early signs of right lower extremity endoscopic vein harvest site cellulitis. The incision itself was intact. There was no significant drainage. She was started on Keflex 500 mg by mouth every 6 hours at that time. On today's date the incision dehisced and the erythema has shown to increase. She was seen by Dr. Dorris Rivas in the office and is sent to [facility] for admission for intravenous antibiotics and ongoing wound care. She denies fevers but on last Sunday she did have an episode of significant sweating and chills. She is having no other constitutional symptoms.  Past Medical History  Diagnosis Date  . Asthma   . Diabetes mellitus   . Sleep disturbance     Had sleep study several years ago, wasn't told she has OSA but told she needs O2 at night  . CAD (coronary artery disease)     NSTEMI 12/12:   LHC 09/02/11: LAD 70-80%, OM2 80%, small postero-lateral segment of the AV circumflex 60-70%, proximal to mid RCA 40%, acute margin 30%, 80% at the takeoff of the PDA.  . 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 Laneta Rivas;   LIMA-LAD, SVG-OM 2, SVG-PDA.  [name] Chronic diastolic heart failure     Echocardiogram 09/01/11: Moderate LVH, EF 55-60%, mild LAE.  [name] Carotid stenosis     dopplers 12/12: RICA 60-79%   . HLD (hyperlipidemia)   .  History of chicken pox   . Stroke     Past Surgical History  Procedure Date  . Cholecystectomy   . Coronary artery bypass graft 09/07/2011    Procedure: CORONARY ARTERY BYPASS GRAFTING (CABG);  Surgeon: Felicia Borne, MD;  Location: [facility] OR;  Service: Open Heart Surgery;  Laterality: N/A;    Family History  Problem Relation Age of Onset  . Cancer Mother   . Heart failure Mother     Also has hx MVP s/p MVR  . Diabetes Mother   . Coronary artery disease Father     Died of MI at age 90  . Heart disease Other     Parent   Social History:  reports that she has quit smoking. Her smoking use included Cigarettes. She smoked .5 packs per day. She quit smokeless tobacco use about 2 months ago. She reports that she does not drink alcohol or use illicit drugs.  Allergies: No Known Allergies  No current facility-administered medications on file as of 11/03/2011.   Medications Prior to Admission  Medication Sig Dispense Refill  . albuterol (PROVENTIL) (2.5 MG/3ML) 0.083% nebulizer solution Take 2.5 mg by nebulization 2 (two) times daily as needed. For shortness of breath and wheezing      .  aspirin EC 325 MG EC tablet Take 1 tablet (325 mg total) by mouth daily.  30 tablet    . budesonide-formoterol (SYMBICORT) 160-4.5 MCG/ACT inhaler Inhale 2 puffs into the lungs 2 (two) times daily as needed. For shortness of breath      . furosemide (LASIX) 40 MG tablet Take 1.5 tablets (60 mg total) by mouth daily.  135 tablet  6  . ibuprofen (ADVIL,MOTRIN) 200 MG tablet Take 800 mg by mouth 2 (two) times daily as needed. For pain      . insulin glargine (LANTUS) 100 UNIT/ML injection Inject 80 Units into the skin 2 (two) times daily.       . insulin lispro (HUMALOG) 100 UNIT/ML injection Inject 20-30 Units into the skin 3 (three) times daily before meals. Inject 30 units in the morning, 20 units in the afternoon, and 30 units at bedtime.      . metoprolol tartrate (LOPRESSOR) 25 MG tablet Take 1 tablet (25  mg total) by mouth 2 (two) times daily.  180 tablet  6  . potassium chloride SA (K-DUR,KLOR-CON) 20 MEQ tablet Take 1.5 tablets (30 mEq total) by mouth daily.  135 tablet  6  . rosuvastatin (CRESTOR) 40 MG tablet Take 1 tablet (40 mg total) by mouth daily at 6 (six) AM.  30 tablet  0   Keflex 500 mg by mouth every 6 hours  No results found for this or any previous visit (from the past 48 hour(s)). No results found. Review of Systems - Negative except as described above. Physical Examination: General appearance - alert, well appearing, and in no distress Mental status - alert, oriented to person, place, and time Eyes - pupils equal and reactive, extraocular eye movements intact, sclera anicteric Ears - bilateral TM's and external ear canals normal, not examined Nose - normal and patent, no erythema, discharge or polyps and not examined Mouth - mucous membranes moist, pharynx normal without lesions Neck - supple, no significant adenopathy, carotid bruit noted Lymphatics - no palpable lymphadenopathy Chest - clear to auscultation, no wheezes, rales or rhonchi, symmetric air entry Heart - normal rate, regular rhythm, normal S1, S2, no murmurs, rubs, clicks or gallops Abdomen - soft, nontender, nondistended, no masses or organomegaly Rectal -deferred Back exam - nontender, no CVA tenderness Neurological - alert, oriented, normal speech, no focal findings or movement disorder noted Musculoskeletal - strength grossly normal Extremities - right lower extremity with significant swelling and erythema associated with the endoscopic vein site. The incision itself has dehisced. It appears clean. There is no current drainage but the dressing is new. The left lower extremity shows edema but much less compared to right. Skin - there are no other skin findings  Weight 303 lb 2.1 oz (137.5 kg). Assessment/Plan Right lower extremity endoscopic vein harvest site infection. Plan is to admit for intravenous  vancomycin. We will also continue twice a day wet to dry normal saline packings. Please see admission orders for further details.  Felicia Rivas,WAYNE E 11/03/2011, 2:33 PM   Chart reviewed, patient examined, agree with above. She has a delayed SV harvest site infection with cellulitis.  The incision has opened and the retained hematoma in the vein harvest tunnel has drained.  She will need a couple days of vanc and dressing changes and when the cellulitis is resolved and the wound clean, she can go home with Bactrim and dressings at home.  The cellulitis already looks much better.

## 2011-11-03 NOTE — Progress Notes (Unsigned)
  Subjective:    Patient ID: Felicia Rivas, female    DOB: 11/28/1955, 56 y.o.   MRN: [id number]  HPI Felicia Rivas is a 56 year old woman who had coronary bypass grafting 8 weeks ago by Dr. Laneta Simmers. She was seen in the office on Tuesday he complained of redness around her leg incision on the right knee. She was found to have cellulitis and started on Keflex. Since that time she is noted that had become progressively more swollen and tender over the next 48 hours. Last night it spontaneously drained from the incision appeared to be old bloody fluid. She's had some improvement in the pain since that spontaneous drainage.   Review of Systems I denies fevers or chills    Objective:   Physical Exam BP 143/75  Pulse 72  Temp 97.9 F (36.6 C)  Resp 16  Ht 5\' 7"  (1.702 m)  Wt 310 lb (140.615 kg)  BMI 48.55 kg/m2  SpO2 97% Right leg large area of erythema surrounding an open wound in the medial aspect of the right leg at the knee. Marked induration and tenderness. Wound probed with Q-tip, proximally 6 cm deep. Some murky fluid expressed.       Assessment & Plan:  Cellulitis and abscess of right leg at saphenous vein harvest site. This has progressed despite treatment with Keflex. In my opinion she needs hospitalization for intravenous antibiotics. Quite possibly could require wound exploration and drainage, but at a minimum requires a deep packing of the wound.

## 2011-11-03 NOTE — Progress Notes (Signed)
ANTIBIOTIC CONSULT NOTE - INITIAL  Pharmacy Consult for vancomycin Indication: cellulitis  No Known Allergies  Patient Measurements: Weight: 303 lb 2.1 oz (137.5 kg)  Vital Signs: Temp: 98.4 F (36.9 C) (02/14 1505) Temp src: Oral (02/14 1505) BP: 142/82 mmHg (02/14 1505) Pulse Rate: 73  (02/14 1505)  Labs: No results found for this basename: WBC:3,HGB:3,PLT:3,LABCREA:3,CREATININE:3 in the last 72 hours The CrCl is unknown because both a height and weight (above a minimum accepted value) are required for this calculation. No results found for this basename: VANCOTROUGH:2,VANCOPEAK:2,VANCORANDOM:2,GENTTROUGH:2,GENTPEAK:2,GENTRANDOM:2,TOBRATROUGH:2,TOBRAPEAK:2,TOBRARND:2,AMIKACINPEAK:2,AMIKACINTROU:2,AMIKACIN:2, in the last 72 hours   Microbiology: No results found for this or any previous visit (from the past 720 hour(s)).  Medical History: Past Medical History  Diagnosis Date  . Asthma   . Diabetes mellitus   . Sleep disturbance     Had sleep study several years ago, wasn't told she has OSA but told she needs O2 at night  . CAD (coronary artery disease)     NSTEMI 12/12:   LHC 09/02/11: LAD 70-80%, OM2 80%, small postero-lateral segment of the AV circumflex 60-70%, proximal to mid RCA 40%, acute margin 30%, 80% at the takeoff of the PDA.  . 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 Laneta Simmers;   LIMA-LAD, SVG-OM 2, SVG-PDA.  [name] Chronic diastolic heart failure     Echocardiogram 09/01/11: Moderate LVH, EF 55-60%, mild LAE.  [name] Carotid stenosis     dopplers 12/12: RICA 60-79%   . HLD (hyperlipidemia)   . History of chicken pox   . Stroke     Medications:  Prescriptions prior to admission  Medication Sig Dispense Refill  . albuterol (PROVENTIL) (2.5 MG/3ML) 0.083% nebulizer solution Take 2.5 mg by nebulization 2 (two) times daily as needed. For shortness of breath and wheezing      . aspirin EC 325 MG EC tablet Take 1 tablet (325 mg total) by mouth daily.  30 tablet    .  budesonide-formoterol (SYMBICORT) 160-4.5 MCG/ACT inhaler Inhale 2 puffs into the lungs 2 (two) times daily as needed. For shortness of breath      . cephALEXin (KEFLEX) 500 MG capsule Take 500 mg by mouth 4 (four) times daily.      . furosemide (LASIX) 40 MG tablet Take 1.5 tablets (60 mg total) by mouth daily.  135 tablet  6  . HYDROcodone-acetaminophen (LORTAB) 7.5-500 MG per tablet Take 1 tablet by mouth every 4 (four) hours as needed. For pain      . ibuprofen (ADVIL,MOTRIN) 200 MG tablet Take 800 mg by mouth 2 (two) times daily as needed. For pain      . insulin glargine (LANTUS) 100 UNIT/ML injection Inject 80 Units into the skin 2 (two) times daily.       . insulin lispro (HUMALOG) 100 UNIT/ML injection Inject 20-30 Units into the skin 3 (three) times daily before meals. Inject 30 units in the morning, 20 units in the afternoon, and 30 units at bedtime.      . metoprolol tartrate (LOPRESSOR) 25 MG tablet Take 1 tablet (25 mg total) by mouth 2 (two) times daily.  180 tablet  6  . potassium chloride SA (K-DUR,KLOR-CON) 20 MEQ tablet Take 1.5 tablets (30 mEq total) by mouth daily.  135 tablet  6  . rosuvastatin (CRESTOR) 40 MG tablet Take 1 tablet (40 mg total) by mouth daily at 6 (six) AM.  30 tablet  0   Assessment: 55 yof s/p CABG in 12/12 presents to the hospital  from her physicians office to receive IV antibiotics for a right lower extremity endoscopic vein harvest site cellulitis. Previously started on keflex without improvement. She denied fever but has had an episode of sweating and chills. Pt is obese despite recent weight loss. BMET not yet reported so unclear renal function. Will load patient with vanc and f/u renal function for further dosing.   Goal of Therapy:  Vancomycin trough level 10-15 mcg/ml  Plan:  Measure antibiotic drug levels at steady state Follow up culture results Vancomycin 2gm IV x 1 F/u BMET to determine renal function for further vanc dosing (if renal function  is normal, will give 2gm IV Q12H  Felicia Rivas, Drake Leach 11/03/2011,3:20 PM

## 2011-11-04 LAB — CBC
Platelets: 272 10*3/uL (ref 150–400)
RBC: 3.95 MIL/uL (ref 3.87–5.11)
WBC: 11.7 10*3/uL — ABNORMAL HIGH (ref 4.0–10.5)

## 2011-11-04 LAB — GLUCOSE, CAPILLARY
Glucose-Capillary: 170 mg/dL — ABNORMAL HIGH (ref 70–99)
Glucose-Capillary: 238 mg/dL — ABNORMAL HIGH (ref 70–99)

## 2011-11-04 MED ORDER — SULFAMETHOXAZOLE-TRIMETHOPRIM 800-160 MG PO TABS: 1.0000 | ORAL_TABLET | Freq: Two times a day (BID) | ORAL | Status: AC

## 2011-11-04 MED ORDER — INSULIN ASPART 100 UNIT/ML ~~LOC~~ SOLN
30.0000 [IU] | Freq: Two times a day (BID) | SUBCUTANEOUS | Status: DC
  Administered 2011-11-04 – 2011-11-06 (×4): 30 [IU] via SUBCUTANEOUS

## 2011-11-04 MED ORDER — POTASSIUM CHLORIDE CRYS ER 20 MEQ PO TBCR
30.0000 meq | EXTENDED_RELEASE_TABLET | Freq: Two times a day (BID) | ORAL | Status: DC
  Administered 2011-11-04 – 2011-11-06 (×5): 30 meq via ORAL
  Filled 2011-11-04 (×6): qty 1

## 2011-11-04 MED ORDER — ENOXAPARIN SODIUM 80 MG/0.8ML ~~LOC~~ SOLN
70.0000 mg | SUBCUTANEOUS | Status: DC
  Administered 2011-11-04 – 2011-11-05 (×2): 70 mg via SUBCUTANEOUS
  Filled 2011-11-04 (×3): qty 0.8

## 2011-11-04 MED ORDER — INSULIN ASPART 100 UNIT/ML ~~LOC~~ SOLN
20.0000 [IU] | Freq: Every day | SUBCUTANEOUS | Status: DC
  Administered 2011-11-05: 20 [IU] via SUBCUTANEOUS

## 2011-11-04 NOTE — Progress Notes (Signed)
   CARE MANAGEMENT NOTE 11/04/2011  Patient:  Felicia Rivas, Felicia Rivas   Account Number:  [id number]  Date Initiated:  11/04/2011  Documentation initiated by:  Avie Arenas  Subjective/Objective Assessment:   Cellulitis - dehisence of harvest area post CABG.  Lives with children.     Action/Plan:   PTA, PT INDPEPENDENT, LIVES WITH ADULT CHILDREN.   Anticipated DC Date:  11/07/2011   Anticipated DC Plan:  HOME W HOME HEALTH SERVICES      DC Planning Services  CM consult      Choice offered to / List presented to:  C-1 Patient        HH arranged  HH-1 RN      [facility] agency  [facility]   Status of service:  In process, will continue to follow Medicare Important Message given?   (If response is "NO", the following Medicare IM given date fields will be blank) Date Medicare IM given:   Date Additional Medicare IM given:    Discharge Disposition:  HOME W HOME HEALTH SERVICES  Per UR Regulation:  Reviewed for med. necessity/level of care/duration of stay  Comments:  11/04/11 Rahsaan Weakland,RN,BSN 1430 MET WITH PT TO DISCUSS DC PLANS.  PT WILL NEED HHRN FOLLOW UP FOR WOUND CARE.  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  PT WOULD LIKE [facility] FOR HOME HEALTH FOLLOW UP.  REFERRAL TO [facility]. START OF CARE 24-48H POST DC DATE. Phone #[phone]

## 2011-11-04 NOTE — Progress Notes (Addendum)
                    [address]            [name] [address]          [phone]          Subjective: Leg is still sore, but a little better than yesterday.  Objective: Vital signs in last 24 hours: Patient Vitals for the past 24 hrs:  BP Temp Temp src Pulse Resp SpO2 Weight  11/04/11 0437 130/51 mmHg 97.3 F (36.3 C) Oral 61  20  96 % 139.164 kg (306 lb 12.8 oz)  11/04/11 0415 149/68 mmHg 98.1 F (36.7 C) Oral 69  20  95 % -  11/03/11 2156 131/63 mmHg 98.3 F (36.8 C) Oral 68  18  97 % -  11/03/11 1505 142/82 mmHg 98.4 F (36.9 C) Oral 73  18  97 % -  11/03/11 1300 - - - - - - 137.5 kg (303 lb 2.1 oz)   Current Weight  11/04/11 139.164 kg (306 lb 12.8 oz)     Intake/Output from previous day: 02/14 0701 - 02/15 0700 In: 980 [P.O.:480; IV Piggyback:500] Out: -   CBGs [phone]  PHYSICAL EXAM:  Heart: RRR  Lungs: clear Wound: RLE with erythema, edema, EVH incision open and draining serous fluid.   Extremities: +edema bilat LEs, R>L  Lab Results: CBC: Basename 11/04/11 0500 11/03/11 1502  WBC 11.7* 14.5*  HGB 11.3* 11.8*  HCT 33.8* 35.0*  PLT 272 259   BMET:  Basename 11/03/11 1502  NA 137  K 3.4*  CL 99  CO2 28  GLUCOSE 208*  BUN 15  CREATININE 0.64  CALCIUM 9.7    PT/INR: No results found for this basename: LABPROT,INR in the last 72 hours   Assessment/Plan: RLE cellulitis/wound infection-  Continue local wound care, IV antibiotics. DM- sugars high this am,  Humalog not restarted on admission.  Resume and continue Lantus as at home.   LOS: 1 day    COLLINS,GINA H 11/04/2011   I have seen and examined the patient and agree with the assessment and plan as outlined.  Kayl Stogdill H 11/04/2011 4:25 PM

## 2011-11-04 NOTE — Discharge Summary (Signed)
[address]            [name] [address]          [phone]         Discharge Summary  Name: Felicia Rivas DOB: 1956-05-04 56 y.o. MRN: [id number]  Admission Date: 11/03/2011 Discharge Date:    Admitting Diagnosis: Active Problems:  Incisional infection   Discharge Diagnosis:  Active Problems:  Incisional infection  Type 2 diabetes mellitus  Coronary artery disease status post coronary artery bypass grafting in December 2012 by Dr. Lavinia Sharps  Chronic diastolic heart failure, EF 55-60% with moderate LVH  Right ICA stenosis 60-79%  Hyperlipidemia  Asthma  H/O stroke    HPI:  The patient is a 56 y.o. female who underwent coronary artery bypass grafting x3 on 09/07/2011 by Dr. Evelene Croon. She had a non-ST segment elevation myocardial infarction on December 12 and was found to have severe three-vessel coronary artery disease. After discharge she has done quite well. She has lost 45 pounds primarily through diet. She was last seen in the office 2 days ago by Dr. Laneta Simmers at which time she she was noted to have early signs of right lower extremity endoscopic vein harvest site cellulitis. The incision itself was intact. There was no significant drainage. She was started on Keflex 500 mg by mouth every 6 hours at that time. On today's date the incision dehisced and the erythema has shown to increase. She was seen by Dr. Dorris Fetch in the office and is sent to [facility] for admission for intravenous antibiotics and ongoing wound care. She denies fevers but on last Sunday she did have an episode of significant sweating and chills. She is having no other constitutional symptoms.    Hospital Course:  The patient was admitted to [facility] on 11/03/2011. She was started on IV vancomycin. On admission her white blood cell count was mildly elevated at 14,000 however this is trending downward. Her right lower extremity cellulitis is  improving. The right EVH site is being packed with wet-to-dry dressings twice daily and although there is still fair amount of serous drainage from the area, the wound itself looks clean without purulence. She has remained afebrile and other vital signs stable. She was noted to be hypertensive, and was started on Lisinopril with improvement in blood pressures. If she continues to progress, we plan to discharge her home within the next 48 hours with home health to assist with wound care.   Recent vital signs:  Filed Vitals:   11/04/11 0437  BP: 130/51  Pulse: 61  Temp: 97.3 F (36.3 C)  Resp: 20    Recent laboratory studies:  CBC: Basename 11/04/11 0500 11/03/11 1502  WBC 11.7* 14.5*  HGB 11.3* 11.8*  HCT 33.8* 35.0*  PLT 272 259   BMET:  Basename 11/03/11 1502  NA 137  K 3.4*  CL 99  CO2 28  GLUCOSE 208*  BUN 15  CREATININE 0.64  CALCIUM 9.7    PT/INR: No results found for this basename: LABPROT,INR in the last 72 hours  Discharge Medications:   Medication List  As of 11/06/2011 11:18 AM   STOP taking these medications         cephALEXin 500 MG capsule         TAKE these medications         albuterol (2.5 MG/3ML) 0.083% nebulizer  solution   Commonly known as: PROVENTIL   Take 2.5 mg by nebulization 2 (two) times daily as needed. For shortness of breath and wheezing      aspirin 325 MG EC tablet   Take 1 tablet (325 mg total) by mouth daily.      budesonide-formoterol 160-4.5 MCG/ACT inhaler   Commonly known as: SYMBICORT   Inhale 2 puffs into the lungs 2 (two) times daily as needed. For shortness of breath      furosemide 40 MG tablet   Commonly known as: LASIX   Take 1.5 tablets (60 mg total) by mouth daily.      HYDROcodone-acetaminophen 7.5-500 MG per tablet   Commonly known as: LORTAB   Take 1 tablet by mouth every 4 (four) hours as needed. For pain      ibuprofen 200 MG tablet   Commonly known as: ADVIL,MOTRIN   Take 800 mg by mouth 2 (two) times  daily as needed. For pain      insulin glargine 100 UNIT/ML injection   Commonly known as: LANTUS   Inject 80 Units into the skin 2 (two) times daily.      insulin lispro 100 UNIT/ML injection   Commonly known as: HUMALOG   Inject 20-30 Units into the skin 3 (three) times daily before meals. Inject 30 units in the morning, 20 units in the afternoon, and 30 units at bedtime.      lisinopril 10 MG tablet   Commonly known as: PRINIVIL,ZESTRIL   Take 1 tablet (10 mg total) by mouth daily.      metoprolol tartrate 25 MG tablet   Commonly known as: LOPRESSOR   Take 1 tablet (25 mg total) by mouth 2 (two) times daily.      potassium chloride SA 20 MEQ tablet   Commonly known as: K-DUR,KLOR-CON   Take 1.5 tablets (30 mEq total) by mouth daily.      rosuvastatin 40 MG tablet   Commonly known as: CRESTOR   Take 1 tablet (40 mg total) by mouth daily at 6 (six) AM.      sulfamethoxazole-trimethoprim 800-160 MG per tablet   Commonly known as: BACTRIM DS,SEPTRA DS   Take 1 tablet by mouth 2 (two) times daily. X 1 week           Discharge Instructions:   Continue saline wet-to-dry dressing changes to right leg twice daily/  Resume diet and activity as tolerated.   Discharge Orders    Future Appointments: Provider: Department: Dept Phone: Center:   11/18/2011 8:15 AM Lewayne Bunting, MD [facility] [phone] [facility]   11/21/2011 2:00 PM [facility] [phone] [facility]   12/01/2011 9:30 AM Oliver Barre, MD [facility] [phone] [facility]      Follow-up Information    Follow up with Alleen Borne, MD on 11/21/2011. (See Dr. Sharee Pimple PA at 2:00)    Contact information:   [address] [place] [address] [phone]           Adella Hare 11/04/2011, 12:15 PM

## 2011-11-04 NOTE — Progress Notes (Signed)
UR Completed.  Abbigal Radich Jane [phone] 11/04/2011  

## 2011-11-04 NOTE — Discharge Instructions (Signed)
Continue packing wound twice daily and as needed with saline wet to dry gauze dressings

## 2011-11-05 LAB — GLUCOSE, CAPILLARY
Glucose-Capillary: 114 mg/dL — ABNORMAL HIGH (ref 70–99)
Glucose-Capillary: 102 mg/dL — ABNORMAL HIGH (ref 70–99)
Glucose-Capillary: 61 mg/dL — ABNORMAL LOW (ref 70–99)
Glucose-Capillary: 99 mg/dL (ref 70–99)

## 2011-11-05 LAB — VANCOMYCIN, TROUGH: Vancomycin Tr: 17.3 ug/mL (ref 10.0–20.0)

## 2011-11-05 MED ORDER — FLUCONAZOLE 150 MG PO TABS
150.0000 mg | ORAL_TABLET | Freq: Once | ORAL | Status: AC
  Administered 2011-11-05: 150 mg via ORAL
  Filled 2011-11-05: qty 1

## 2011-11-05 MED ORDER — LISINOPRIL 10 MG PO TABS
10.0000 mg | ORAL_TABLET | Freq: Every day | ORAL | Status: DC
  Administered 2011-11-05 – 2011-11-06 (×2): 10 mg via ORAL
  Filled 2011-11-05 (×2): qty 1

## 2011-11-05 MED ORDER — VANCOMYCIN HCL 1000 MG IV SOLR
1500.0000 mg | Freq: Two times a day (BID) | INTRAVENOUS | Status: DC
  Administered 2011-11-05 – 2011-11-06 (×2): 1500 mg via INTRAVENOUS
  Filled 2011-11-05 (×5): qty 1500

## 2011-11-05 NOTE — Progress Notes (Addendum)
                    [address]            [place] [address]          [phone]          Subjective: Stable.  Feels like she is getting a yeast infection.  Objective: Vital signs in last 24 hours: Patient Vitals for the past 24 hrs:  BP Temp Temp src Pulse Resp SpO2 Height Weight  11/05/11 0616 149/95 mmHg 98.5 F (36.9 C) Oral 68  18  96 % - 141 kg (310 lb 13.6 oz)  11/04/11 2020 174/76 mmHg 98.5 F (36.9 C) Oral 70  18  96 % - -  11/04/11 1353 182/77 mmHg 97.6 F (36.4 C) Oral 66  18  95 % - -  11/04/11 0946 - - - - - - 5\' 7"  (1.702 m) 139.4 kg (307 lb 5.1 oz)   Current Weight  11/05/11 141 kg (310 lb 13.6 oz)     Intake/Output from previous day: 02/15 0701 - 02/16 0700 In: 2200 [P.O.:1200; IV Piggyback:1000] Out: -   CBGs [phone]  PHYSICAL EXAM:  Heart: RRR Lungs: clear Wound: RLE EVH wound open, +serous drainage, significantly decreased erythema Extremities: + RLE edema  Lab Results: CBC: Basename 11/04/11 0500 11/03/11 1502  WBC 11.7* 14.5*  HGB 11.3* 11.8*  HCT 33.8* 35.0*  PLT 272 259   BMET:  Basename 11/03/11 1502  NA 137  K 3.4*  CL 99  CO2 28  GLUCOSE 208*  BUN 15  CREATININE 0.64  CALCIUM 9.7    PT/INR: No results found for this basename: LABPROT,INR in the last 72 hours   Assessment/Plan: RLE cellulitis- improved on IV abx.  Will continue for now and change to Bactrim for MRSA coverage upon discharge home per Dr. Laneta Simmers. Continue wet-dry dressing changes. DM- sugars improved, back on home med regimen now. Will Rx Diflucan for yeast infection. Possibly home in am with [facility] to follow for wound care.   LOS: 2 days    COLLINS,GINA H 11/05/2011   I have seen and examined the patient and agree with the assessment and plan as outlined.  BP relatively high since admission.  Will add ACE-I  Kaion Tisdale H 11/05/2011 12:40 PM

## 2011-11-05 NOTE — Progress Notes (Addendum)
ANTIBIOTIC CONSULT NOTE - FOLLOW UP  Pharmacy Consult for Vancomycin Indication: Cellulitis  No Known Allergies  Patient Measurements: Height: 5\' 7"  (170.2 cm) Weight: 310 lb 13.6 oz (141 kg) IBW/kg (Calculated) : 61.6    Vital Signs: Temp: 98.5 F (36.9 C) (02/16 0616) Temp src: Oral (02/16 0616) BP: 148/90 mmHg (02/16 1249) Pulse Rate: 66  (02/16 1038) Intake/Output from previous day: 02/15 0701 - 02/16 0700 In: 2200 [P.O.:1200; IV Piggyback:1000] Out: -   Labs:  Basename 11/04/11 0500 11/03/11 1502  WBC 11.7* 14.5*  HGB 11.3* 11.8*  PLT 272 259  LABCREA -- --  CREATININE -- 0.64   Estimated Creatinine Clearance: 115.8 ml/min (by C-G formula based on Cr of 0.64).   Microbiology: No results found for this or any previous visit (from the past 720 hour(s)).  Anti-infectives Anti-infectives     Start     Dose/Rate Route Frequency Ordered Stop   11/05/11 1030   fluconazole (DIFLUCAN) tablet 150 mg        150 mg Oral  Once 11/05/11 0919 11/05/11 1039   11/04/11 0400   vancomycin (VANCOCIN) 2,000 mg in sodium chloride 0.9 % 500 mL IVPB        2,000 mg 250 mL/hr over 120 Minutes Intravenous Every 12 hours 11/03/11 1600     11/04/11 0000  sulfamethoxazole-trimethoprim (BACTRIM DS) 800-160 MG per tablet       1 tablet Oral 2 times daily 11/04/11 1205 11/14/11 2359   11/03/11 1600   vancomycin (VANCOCIN) 2,000 mg in sodium chloride 0.9 % 500 mL IVPB        2,000 mg 250 mL/hr over 120 Minutes Intravenous  Once 11/03/11 1517 11/03/11 1803   11/03/11 1500   vancomycin (VANCOCIN) IVPB 1000 mg/200 mL premix  Status:  Discontinued        1,000 mg 200 mL/hr over 60 Minutes Intravenous Every 12 hours 11/03/11 1501 11/03/11 1510          Assessment: Patient is a 56 y/o female on Vancomycin for RLE cellulitis.  Currently afebrile.  Goal of Therapy:  Vancomycin trough level 10-15 mcg/ml  Plan:  1.  Will draw Vancomycin trough level today and follow-up pending  results.   Kaelyn Innocent, Elisha Headland, Pharm.D. 11/05/2011 2:36 PM  Pharmacy Note Re: Vancomycin trough results.  Vancomycin trough 17.36mcg/ml (15:03 pm) Currently on Vancomycin 2gm IV q 12 hours.  Goal:  Vancomycin trough level 10 - 15 mcg/ml.  Plan:  Change Vancomycin to 1500 mg IV q 12hours.  Claudene Gatliff, Elisha Headland, Pharm.D. 11/05/2011 4:18 PM

## 2011-11-06 LAB — GLUCOSE, CAPILLARY: Glucose-Capillary: 72 mg/dL (ref 70–99)

## 2011-11-06 MED ORDER — LISINOPRIL 10 MG PO TABS: 10.0000 mg | ORAL_TABLET | Freq: Every day | ORAL | Status: DC

## 2011-11-06 MED ORDER — OXYCODONE HCL 5 MG PO TABS: 5.0000 mg | ORAL_TABLET | ORAL | Status: AC | PRN

## 2011-11-06 NOTE — Progress Notes (Signed)
Pt ambulated in hallway 500 feet independently. Pt tolerated well.  Alfonso Ellis, RN

## 2011-11-06 NOTE — Progress Notes (Signed)
Pt. Discharged 11/06/2011  3:50 PM Discharge instructions reviewed with patient/family. Patient/family verbalized understanding. All Rx's given. Questions answered as needed. Pt. Discharged to home with family/self. Taken off unit via W/C. Mitzy Naron

## 2011-11-06 NOTE — Progress Notes (Addendum)
                    [address]            [name] [address]          [phone]          Subjective: Feels well, wants to go home.  Objective: Vital signs in last 24 hours: Patient Vitals for the past 24 hrs:  BP Temp Temp src Pulse Resp SpO2 Weight  11/06/11 0518 151/78 mmHg 97.5 F (36.4 C) Oral 69  18  95 % 139.2 kg (306 lb 14.1 oz)  11/05/11 2039 167/77 mmHg - Oral 63  19  96 % -  11/05/11 1443 154/70 mmHg 97 F (36.1 C) Oral 64  20  96 % -  11/05/11 1249 148/90 mmHg - - - - - -  11/05/11 1038 146/92 mmHg - - 66  - - -   Current Weight  11/06/11 139.2 kg (306 lb 14.1 oz)     Intake/Output from previous day: 02/16 0701 - 02/17 0700 In: 720 [P.O.:720] Out: -   CBGs [phone]  PHYSICAL EXAM:  Heart: RRR Lungs:clear Wound: RLE with very minimal erythema, decreased edema.  Wound clean, minimal serous drainage.   Lab Results: CBC: Basename 11/04/11 0500 11/03/11 1502  WBC 11.7* 14.5*  HGB 11.3* 11.8*  HCT 33.8* 35.0*  PLT 272 259   BMET:  Basename 11/03/11 1502  NA 137  K 3.4*  CL 99  CO2 28  GLUCOSE 208*  BUN 15  CREATININE 0.64  CALCIUM 9.7    PT/INR: No results found for this basename: LABPROT,INR in the last 72 hours   Assessment/Plan: RLE cellulitis- improved.  Will change to po abx.   Continue local wound care. BPs better.  Home this am with [facility] to follow.   LOS: 3 days    COLLINS,GINA H 11/06/2011   I have seen and examined the patient and agree with the assessment and plan as outlined.  Jemaine Prokop H 11/06/2011 1:28 PM

## 2011-11-07 ENCOUNTER — Other Ambulatory Visit: Payer: Self-pay | Admitting: Surgery

## 2011-11-07 DIAGNOSIS — G8918 Other acute postprocedural pain: Secondary | ICD-10-CM

## 2011-11-18 ENCOUNTER — Ambulatory Visit: Payer: BC Managed Care – PPO | Admitting: Cardiology

## 2011-11-21 ENCOUNTER — Ambulatory Visit (INDEPENDENT_AMBULATORY_CARE_PROVIDER_SITE_OTHER): Payer: Self-pay | Admitting: Surgical

## 2011-11-21 VITALS — BP 158/73 | HR 68 | Resp 20 | Ht 67.0 in | Wt 305.0 lb

## 2011-11-21 DIAGNOSIS — L03119 Cellulitis of unspecified part of limb: Secondary | ICD-10-CM

## 2011-11-21 DIAGNOSIS — I2510 Atherosclerotic heart disease of native coronary artery without angina pectoris: Secondary | ICD-10-CM

## 2011-11-21 DIAGNOSIS — L02419 Cutaneous abscess of limb, unspecified: Secondary | ICD-10-CM

## 2011-11-21 DIAGNOSIS — Z951 Presence of aortocoronary bypass graft: Secondary | ICD-10-CM

## 2011-11-21 NOTE — Patient Instructions (Signed)
The patient is to return to the office on a when necessary basis. She knows to inform the office if she has any changes in the wound this, specifically increasing erythema or drainage.

## 2011-11-21 NOTE — Progress Notes (Signed)
  Subjective:    Patient ID: Felicia Rivas, female    DOB: Mar 29, 1956, 56 y.o.   MRN: [id number]  HPI Felicia Rivas is seen in the office in routine followup for her endoscopic vein harvest site infection. She is doing quite well. The wound itself has significantly improved and she is only able to get a small amount of packing into it. There is no drainage. There is no surrounding erythema. She denies fevers chills or other constitutional symptoms.    Review of Systems remarkable only for some sternal discomfort at times. She takes 1 oxycodone 5 mg tablet nightly with good relief.     Objective:   Physical Exam general-well-developed adult female in no acute distress Incisions-the right lower extremity vein harvest site is healing well. It was probed with a Q-tip approximately 2 mm. There is excellent granulation tissue. There is no erythema.        Assessment & Plan:  She continues to do quite well. I told her that I feel as though she can continue to packing it for approximately one additional week. I also told her that because is doing so well I don't think she needs to come back to see [place] on what she has a new issue. We discussed things to look for regarding the wound.

## 2011-11-25 ENCOUNTER — Ambulatory Visit (INDEPENDENT_AMBULATORY_CARE_PROVIDER_SITE_OTHER): Payer: BC Managed Care – PPO | Admitting: Cardiology

## 2011-11-25 ENCOUNTER — Encounter: Payer: Self-pay | Admitting: Cardiology

## 2011-11-25 DIAGNOSIS — I5032 Chronic diastolic (congestive) heart failure: Secondary | ICD-10-CM

## 2011-11-25 DIAGNOSIS — E785 Hyperlipidemia, unspecified: Secondary | ICD-10-CM

## 2011-11-25 DIAGNOSIS — I10 Essential (primary) hypertension: Secondary | ICD-10-CM

## 2011-11-25 DIAGNOSIS — I2510 Atherosclerotic heart disease of native coronary artery without angina pectoris: Secondary | ICD-10-CM

## 2011-11-25 DIAGNOSIS — I509 Heart failure, unspecified: Secondary | ICD-10-CM

## 2011-11-25 DIAGNOSIS — I6529 Occlusion and stenosis of unspecified carotid artery: Secondary | ICD-10-CM

## 2011-11-25 NOTE — Assessment & Plan Note (Signed)
Continue statin. 

## 2011-11-25 NOTE — Assessment & Plan Note (Signed)
Continue aspirin and statin. Risk factor modification discussed.

## 2011-11-25 NOTE — Patient Instructions (Signed)
Your physician wants you to follow-up in: 6 MONTHS You will receive a reminder letter in the mail two months in advance. If you don't receive a letter, please call our office to schedule the follow-up appointment.  Your physician has requested that you have a carotid duplex. This test is an ultrasound of the carotid arteries in your neck. It looks at blood flow through these arteries that supply the brain with blood. Allow one hour for this exam. There are no restrictions or special instructions.  

## 2011-11-25 NOTE — Progress Notes (Signed)
HPI: Pleasant female for FU of CAD. She was admitted 12/12-12/24 With diastolic heart failure in the setting of NSTEMI and probable community-acquired pneumonia. She was initially treated for community-acquired pneumonia. She was seen in consultation by cardiology. She ruled in for myocardial infarction. She was diuresed for heart failure. LHC 09/02/11: LAD 70-80%, OM2 80%, small postero-lateral segment of the AV circumflex 60-70%, proximal to mid RCA 40%, acute margin 30%, 80% at the takeoff of the PDA. Echocardiogram 09/01/11: Moderate LVH, EF 55-60%, mild LAE. Given her three-vessel CAD, she was referred for CABG with Dr. Laneta Simmers. CABG 09/07/11: LIMA-LAD, SVG-OM 2, SVG-PDA. Seen by Tereso Newcomer in Jan 2013. Since then, she was admitted with cellulitis for IV antibiotics.  Patient denies dyspnea on exertion, orthopnea, PND or syncope. She has some chest tightness predominantly in the afternoons that increases with movements.   Current Outpatient Prescriptions  Medication Sig Dispense Refill  . albuterol (PROVENTIL) (2.5 MG/3ML) 0.083% nebulizer solution Take 2.5 mg by nebulization 2 (two) times daily as needed. For shortness of breath and wheezing      . aspirin EC 325 MG EC tablet Take 1 tablet (325 mg total) by mouth daily.  30 tablet    . budesonide-formoterol (SYMBICORT) 160-4.5 MCG/ACT inhaler Inhale 2 puffs into the lungs 2 (two) times daily as needed. For shortness of breath      . furosemide (LASIX) 40 MG tablet Take 1.5 tablets (60 mg total) by mouth daily.  135 tablet  6  . ibuprofen (ADVIL,MOTRIN) 200 MG tablet Take 800 mg by mouth 2 (two) times daily as needed. For pain      . insulin glargine (LANTUS) 100 UNIT/ML injection Inject 80 Units into the skin 2 (two) times daily.       . insulin lispro (HUMALOG) 100 UNIT/ML injection Inject 20-30 Units into the skin 3 (three) times daily before meals. Inject 30 units in the morning, 20 units in the afternoon, and 30 units at bedtime.      [name]  lisinopril (PRINIVIL,ZESTRIL) 10 MG tablet Take 1 tablet (10 mg total) by mouth daily.  30 tablet  1  . metoprolol tartrate (LOPRESSOR) 25 MG tablet Take 1 tablet (25 mg total) by mouth 2 (two) times daily.  180 tablet  6  . oxyCODONE (OXY IR/ROXICODONE) 5 MG immediate release tablet prn      . potassium chloride SA (K-DUR,KLOR-CON) 20 MEQ tablet Take 1.5 tablets (30 mEq total) by mouth daily.  135 tablet  6  . rosuvastatin (CRESTOR) 40 MG tablet Take 1 tablet (40 mg total) by mouth daily at 6 (six) AM.  30 tablet  0     Past Medical History  Diagnosis Date  . Asthma   . Diabetes mellitus   . Sleep disturbance     Had sleep study several years ago, wasn't told she has OSA but told she needs O2 at night  . CAD (coronary artery disease)     NSTEMI 12/12:   LHC 09/02/11: LAD 70-80%, OM2 80%, small postero-lateral segment of the AV circumflex 60-70%, proximal to mid RCA 40%, acute margin 30%, 80% at the takeoff of the PDA.  . 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 Laneta Simmers;   LIMA-LAD, SVG-OM 2, SVG-PDA.  [name] Chronic diastolic heart failure     Echocardiogram 09/01/11: Moderate LVH, EF 55-60%, mild LAE.  [name] Carotid stenosis     dopplers 12/12: RICA 60-79%   . HLD (hyperlipidemia)   . History of chicken  pox   . Complication of anesthesia   . PONV (postoperative nausea and vomiting)   . CHF (congestive heart failure)   . Myocardial infarction 08/2011    "had 2 in December for a total of  3"  . Pneumonia ~ 2009  . Shortness of breath     "all the time"  . Stroke 2006    denies residual    Past Surgical History  Procedure Date  . Cholecystectomy 1990's  . Tubal ligation 1981  . Coronary artery bypass graft 09/07/2011    CABG X3; Procedure: CORONARY ARTERY BYPASS GRAFTING (CABG);  Surgeon: Alleen Borne, MD;  Location: [facility];  Service: Open Heart Surgery;  Laterality: N/A;    History   Social History  . Marital Status: Married    Spouse Name: N/A    Number of Children: 3  . Years of  Education: 16   Occupational History  . Teacher    Social History Main Topics  . Smoking status: Former Smoker -- 0.5 packs/day for 25 years    Types: Cigarettes  . Smokeless tobacco: Never Used  . Alcohol Use: No  . Drug Use: No  . Sexually Active: No   Other Topics Concern  . Not on file   Social History Narrative   Regular exercise-yes    ROS: Recent URI symptoms but no hemoptysis, dysphasia, odynophagia, melena, hematochezia, dysuria, hematuria, rash, seizure activity, orthopnea, PND, pedal edema, claudication. Remaining systems are negative.  Physical Exam: Well-developed obese in no acute distress.  Skin is warm and dry.  HEENT is normal.  Neck is supple. Bilateral bruits Chest is clear to auscultation with normal expansion.  Cardiovascular exam is regular rate and rhythm.  Abdominal exam nontender or distended. No masses palpated. Extremities show trace edema. neuro grossly intact

## 2011-11-25 NOTE — Assessment & Plan Note (Signed)
Continue aspirin and statin. Followup carotid Dopplers June 2013. 

## 2011-11-25 NOTE — Assessment & Plan Note (Signed)
Euvolemic on examination.Continue present dose of Lasix. 

## 2011-11-25 NOTE — Assessment & Plan Note (Signed)
Blood pressure elevated but she follows this at home and states typically 120/60. We will follow and adjust medications as needed.

## 2011-11-26 ENCOUNTER — Encounter: Payer: Self-pay | Admitting: Internal Medicine

## 2011-11-26 DIAGNOSIS — Z0000 Encounter for general adult medical examination without abnormal findings: Secondary | ICD-10-CM | POA: Insufficient documentation

## 2011-12-01 ENCOUNTER — Ambulatory Visit: Payer: BC Managed Care – PPO | Admitting: Internal Medicine

## 2011-12-01 DIAGNOSIS — Z0289 Encounter for other administrative examinations: Secondary | ICD-10-CM

## 2012-01-20 ENCOUNTER — Other Ambulatory Visit: Payer: Self-pay | Admitting: Physician Assistant

## 2012-01-20 ENCOUNTER — Other Ambulatory Visit: Payer: Self-pay | Admitting: Surgery

## 2012-01-20 ENCOUNTER — Other Ambulatory Visit: Payer: Self-pay | Admitting: Endocrinology

## 2012-01-20 ENCOUNTER — Other Ambulatory Visit: Payer: Self-pay | Admitting: *Deleted

## 2012-01-20 DIAGNOSIS — G8918 Other acute postprocedural pain: Secondary | ICD-10-CM

## 2012-01-21 ENCOUNTER — Other Ambulatory Visit: Payer: Self-pay | Admitting: Physician Assistant

## 2012-01-21 ENCOUNTER — Other Ambulatory Visit: Payer: Self-pay | Admitting: Endocrinology

## 2012-01-28 ENCOUNTER — Other Ambulatory Visit: Payer: Self-pay | Admitting: Physician Assistant

## 2012-02-27 ENCOUNTER — Other Ambulatory Visit: Payer: Self-pay | Admitting: Surgery

## 2012-02-27 ENCOUNTER — Other Ambulatory Visit: Payer: Self-pay | Admitting: Physician Assistant

## 2012-09-07 ENCOUNTER — Other Ambulatory Visit: Payer: Self-pay | Admitting: Endocrinology

## 2012-09-21 ENCOUNTER — Encounter: Payer: Self-pay | Admitting: Cardiology

## 2012-09-21 ENCOUNTER — Ambulatory Visit (INDEPENDENT_AMBULATORY_CARE_PROVIDER_SITE_OTHER): Payer: BC Managed Care – PPO | Admitting: Cardiology

## 2012-09-21 VITALS — BP 176/76 | HR 69 | Ht 67.0 in | Wt 325.0 lb

## 2012-09-21 DIAGNOSIS — I6529 Occlusion and stenosis of unspecified carotid artery: Secondary | ICD-10-CM

## 2012-09-21 DIAGNOSIS — R0609 Other forms of dyspnea: Secondary | ICD-10-CM

## 2012-09-21 DIAGNOSIS — I5032 Chronic diastolic (congestive) heart failure: Secondary | ICD-10-CM

## 2012-09-21 DIAGNOSIS — I10 Essential (primary) hypertension: Secondary | ICD-10-CM

## 2012-09-21 DIAGNOSIS — E785 Hyperlipidemia, unspecified: Secondary | ICD-10-CM

## 2012-09-21 DIAGNOSIS — I2510 Atherosclerotic heart disease of native coronary artery without angina pectoris: Secondary | ICD-10-CM

## 2012-09-21 LAB — CBC WITH DIFFERENTIAL/PLATELET
Basophils Absolute: 0 10*3/uL (ref 0.0–0.1)
Eosinophils Relative: 3 % (ref 0.0–5.0)
HCT: 38.7 % (ref 36.0–46.0)
Lymphocytes Relative: 23.3 % (ref 12.0–46.0)
Lymphs Abs: 2.1 10*3/uL (ref 0.7–4.0)
Monocytes Relative: 7 % (ref 3.0–12.0)
Platelets: 212 10*3/uL (ref 150.0–400.0)
WBC: 8.9 10*3/uL (ref 4.5–10.5)

## 2012-09-21 LAB — TSH: TSH: 1.48 u[IU]/mL (ref 0.35–5.50)

## 2012-09-21 LAB — BASIC METABOLIC PANEL
CO2: 32 mEq/L (ref 19–32)
Calcium: 8.8 mg/dL (ref 8.4–10.5)
Chloride: 107 mEq/L (ref 96–112)
Glucose, Bld: 74 mg/dL (ref 70–99)
Potassium: 3.7 mEq/L (ref 3.5–5.1)
Sodium: 142 mEq/L (ref 135–145)

## 2012-09-21 MED ORDER — LISINOPRIL 5 MG PO TABS: 5.0000 mg | ORAL_TABLET | Freq: Every day | ORAL | Status: DC

## 2012-09-21 MED ORDER — ROSUVASTATIN CALCIUM 40 MG PO TABS: 40.0000 mg | ORAL_TABLET | Freq: Every day | ORAL | Status: DC

## 2012-09-21 NOTE — Assessment & Plan Note (Signed)
Patient has noticed increased dyspnea and pedal edema recently. Increase Lasix to 80 mg in the morning and 40 mg in the afternoon for 5 days and then resume 80 mg daily. Instructed on low sodium diet and decreased by mouth fluid intake. Check potassium, renal function and BNP today. Repeat potassium and renal function in one week. She also notices fatigue. Check TSH and hemoglobin.

## 2012-09-21 NOTE — Assessment & Plan Note (Signed)
Continue aspirin and resume statin. Schedule followup carotid Dopplers.

## 2012-09-21 NOTE — Assessment & Plan Note (Signed)
Resume Crestor 40 mg daily. Lipids and liver in 6 weeks.

## 2012-09-21 NOTE — Assessment & Plan Note (Signed)
Blood pressure is elevated today. Add lisinopril 5 mg daily.

## 2012-09-21 NOTE — Patient Instructions (Signed)
Your physician recommends that you schedule a follow-up appointment in: 8 WEEKS WITH DR CRENSHAW  INCREASE FUROSEMIDE TO 80 MG IN THE MORNING AND 40 MG AT 2 PM DAILY FOR THE NEXT 5 DAYS THEN DECREASE BACK TO 80 MG ONCE DAILY  RESTART LISINOPRIL 5 MG ONCE DAILY  RESTART CRESTOR 40 MG ONCE DAILY  Your physician recommends that you HAVE LAB WORK TODAY AND IN ONE WEEK   Your physician has requested that you have a carotid duplex. This test is an ultrasound of the carotid arteries in your neck. It looks at blood flow through these arteries that supply the brain with blood. Allow one hour for this exam. There are no restrictions or special instructions.

## 2012-09-21 NOTE — Progress Notes (Signed)
HPI: Pleasant female for FU of CAD. Patient previously admitted with diastolic heart failure in the setting of NSTEMI and probable community-acquired pneumonia. She was seen in consultation by cardiology. She ruled in for myocardial infarction. She was diuresed for heart failure. LHC 09/02/11: LAD 70-80%, OM2 80%, small postero-lateral segment of the AV circumflex 60-70%, proximal to mid RCA 40%, acute margin 30%, 80% at the takeoff of the PDA. Echocardiogram 09/01/11: Moderate LVH, EF 55-60%, mild LAE. Given her three-vessel CAD, she was referred for CABG with Dr. Laneta Simmers. CABG 09/07/11: LIMA-LAD, SVG-OM 2, SVG-PDA. Note preoperative carotid Dopplers showed a 60-79% right stenosis and no left stenosis. I last saw her in March of 2013. Since then, she notices increased dyspnea on exertion recently. No orthopnea or PND but there is mild increased pedal edema. No chest pain or syncope.   Current Outpatient Prescriptions  Medication Sig Dispense Refill  . albuterol (PROVENTIL) (2.5 MG/3ML) 0.083% nebulizer solution Take 2.5 mg by nebulization 2 (two) times daily as needed. For shortness of breath and wheezing      . aspirin EC 325 MG EC tablet Take 1 tablet (325 mg total) by mouth daily.  30 tablet    . budesonide-formoterol (SYMBICORT) 160-4.5 MCG/ACT inhaler Inhale 2 puffs into the lungs 2 (two) times daily as needed. For shortness of breath      . furosemide (LASIX) 40 MG tablet Take 80 mg by mouth daily.      [name] HUMALOG 100 UNIT/ML injection INJECT 30 UNITS SUBCUTANEOUSLY IN THE MORNING 20 UNITS AT NOON AND 30 UNITS IN THE EVENING AS DIRECTED  20 mL  1  . ibuprofen (ADVIL,MOTRIN) 200 MG tablet Take 800 mg by mouth 2 (two) times daily as needed. For pain      . LANTUS 100 UNIT/ML injection INJECT 80 UNITS SUBCUTANEOUSLY TWICE DAILY AS DIRECTED  50 mL  1  . metoprolol tartrate (LOPRESSOR) 25 MG tablet Take 1 tablet (25 mg total) by mouth 2 (two) times daily.  180 tablet  6  . potassium chloride SA  (K-DUR,KLOR-CON) 20 MEQ tablet Take 1.5 tablets (30 mEq total) by mouth daily.  135 tablet  6  . rosuvastatin (CRESTOR) 40 MG tablet Take 1 tablet (40 mg total) by mouth daily at 6 (six) AM.  30 tablet  0     Past Medical History  Diagnosis Date  . Asthma   . Diabetes mellitus   . Sleep disturbance     Had sleep study several years ago, wasn't told she has OSA but told she needs O2 at night  . CAD (coronary artery disease)     NSTEMI 12/12:   LHC 09/02/11: LAD 70-80%, OM2 80%, small postero-lateral segment of the AV circumflex 60-70%, proximal to mid RCA 40%, acute margin 30%, 80% at the takeoff of the PDA.  . 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 Laneta Simmers;   LIMA-LAD, SVG-OM 2, SVG-PDA.  [name] Chronic diastolic heart failure     Echocardiogram 09/01/11: Moderate LVH, EF 55-60%, mild LAE.  [name] Carotid stenosis     dopplers 12/12: RICA 60-79%   . HLD (hyperlipidemia)   . History of chicken pox   . Complication of anesthesia   . PONV (postoperative nausea and vomiting)   . CHF (congestive heart failure)   . Myocardial infarction 08/2011    "had 2 in December for a total of  3"  . Pneumonia ~ 2009  . Shortness of breath     "all  the time"  . Stroke 2006    denies residual    Past Surgical History  Procedure Date  . Cholecystectomy 1990's  . Tubal ligation 1981  . Coronary artery bypass graft 09/07/2011    CABG X3; Procedure: CORONARY ARTERY BYPASS GRAFTING (CABG);  Surgeon: Alleen Borne, MD;  Location: [facility] OR;  Service: Open Heart Surgery;  Laterality: N/A;    History   Social History  . Marital Status: Married    Spouse Name: N/A    Number of Children: 3  . Years of Education: 16   Occupational History  . Teacher    Social History Main Topics  . Smoking status: Former Smoker -- 0.5 packs/day for 25 years    Types: Cigarettes  . Smokeless tobacco: Never Used  . Alcohol Use: No  . Drug Use: No  . Sexually Active: No   Other Topics Concern  . Not on file   Social History  Narrative   Regular exercise-yes    ROS: no fevers or chills, productive cough, hemoptysis, dysphasia, odynophagia, melena, hematochezia, dysuria, hematuria, rash, seizure activity, orthopnea, PND, claudication. Remaining systems are negative.  Physical Exam: Well-developed obese in no acute distress.  Skin is warm and dry.  HEENT is normal.  Neck is supple.  Chest is clear to auscultation with normal expansion.  Cardiovascular exam is regular rate and rhythm.  Abdominal exam nontender or distended. No masses palpated. Extremities show trace edema. neuro grossly intact  ECG sinus rhythm at a rate of 69. Left anterior fascicular block. No ST changes.

## 2012-09-21 NOTE — Assessment & Plan Note (Signed)
Continue aspirin. Resume statin. 

## 2012-10-08 ENCOUNTER — Encounter (INDEPENDENT_AMBULATORY_CARE_PROVIDER_SITE_OTHER): Payer: BC Managed Care – PPO

## 2012-10-08 ENCOUNTER — Other Ambulatory Visit (INDEPENDENT_AMBULATORY_CARE_PROVIDER_SITE_OTHER): Payer: BC Managed Care – PPO

## 2012-10-08 DIAGNOSIS — I6529 Occlusion and stenosis of unspecified carotid artery: Secondary | ICD-10-CM

## 2012-10-08 DIAGNOSIS — I5032 Chronic diastolic (congestive) heart failure: Secondary | ICD-10-CM

## 2012-10-08 DIAGNOSIS — I2510 Atherosclerotic heart disease of native coronary artery without angina pectoris: Secondary | ICD-10-CM

## 2012-10-08 DIAGNOSIS — I10 Essential (primary) hypertension: Secondary | ICD-10-CM

## 2012-10-08 LAB — BASIC METABOLIC PANEL
BUN: 16 mg/dL (ref 6–23)
Creatinine, Ser: 0.7 mg/dL (ref 0.4–1.2)
GFR: 99.64 mL/min (ref >60.00)
Glucose, Bld: 111 mg/dL — ABNORMAL HIGH (ref 70–99)

## 2012-10-09 ENCOUNTER — Encounter: Payer: Self-pay | Admitting: Cardiology

## 2012-10-09 ENCOUNTER — Ambulatory Visit (INDEPENDENT_AMBULATORY_CARE_PROVIDER_SITE_OTHER): Payer: BC Managed Care – PPO | Admitting: Cardiology

## 2012-10-09 VITALS — BP 140/80 | HR 66 | Wt 325.0 lb

## 2012-10-09 DIAGNOSIS — I2510 Atherosclerotic heart disease of native coronary artery without angina pectoris: Secondary | ICD-10-CM

## 2012-10-09 DIAGNOSIS — E785 Hyperlipidemia, unspecified: Secondary | ICD-10-CM

## 2012-10-09 DIAGNOSIS — I509 Heart failure, unspecified: Secondary | ICD-10-CM

## 2012-10-09 DIAGNOSIS — I10 Essential (primary) hypertension: Secondary | ICD-10-CM

## 2012-10-09 DIAGNOSIS — I6529 Occlusion and stenosis of unspecified carotid artery: Secondary | ICD-10-CM

## 2012-10-09 MED ORDER — POTASSIUM CHLORIDE CRYS ER 20 MEQ PO TBCR: 60.0000 meq | EXTENDED_RELEASE_TABLET | Freq: Two times a day (BID) | ORAL | Status: DC

## 2012-10-09 MED ORDER — FUROSEMIDE 40 MG PO TABS: 80.0000 mg | ORAL_TABLET | Freq: Two times a day (BID) | ORAL | Status: DC

## 2012-10-09 NOTE — Progress Notes (Signed)
HPI: Pleasant female for FU of CAD. LHC 09/02/11: LAD 70-80%, OM2 80%, small postero-lateral segment of the AV circumflex 60-70%, proximal to mid RCA 40%, acute margin 30%, 80% at the takeoff of the PDA. Echocardiogram 09/01/11: Moderate LVH, EF 55-60%, mild LAE. Given her three-vessel CAD, she was referred for CABG with Dr. Laneta Simmers. CABG 09/07/11: LIMA-LAD, SVG-OM 2, SVG-PDA. Last carotid Dopplers in January of 2014 showed a 60-79% right and 40-59% left stenosis. Followup recommended in 6 months. I last saw her in Jan 2014 and she was complaining of increasing dyspnea. Lasix increased and lisinopril added. Since then, her dyspnea transiently improved with higher dose Lasix. However after resuming 80 mg daily she is describing increasing dyspnea on exertion as well as orthopnea. She has a cough productive of whitish sputum. Also mild pedal edema. No exertional chest pain. No fevers or chills.   Current Outpatient Prescriptions  Medication Sig Dispense Refill  . albuterol (PROVENTIL) (2.5 MG/3ML) 0.083% nebulizer solution Take 2.5 mg by nebulization 2 (two) times daily as needed. For shortness of breath and wheezing      . aspirin EC 325 MG EC tablet Take 1 tablet (325 mg total) by mouth daily.  30 tablet    . budesonide-formoterol (SYMBICORT) 160-4.5 MCG/ACT inhaler Inhale 2 puffs into the lungs 2 (two) times daily as needed. For shortness of breath      . furosemide (LASIX) 40 MG tablet Take 80 mg by mouth daily.      [name] HUMALOG 100 UNIT/ML injection INJECT 30 UNITS SUBCUTANEOUSLY IN THE MORNING 20 UNITS AT NOON AND 30 UNITS IN THE EVENING AS DIRECTED  20 mL  1  . ibuprofen (ADVIL,MOTRIN) 200 MG tablet Take 800 mg by mouth 2 (two) times daily as needed. For pain      . LANTUS 100 UNIT/ML injection INJECT 80 UNITS SUBCUTANEOUSLY TWICE DAILY AS DIRECTED  50 mL  1  . lisinopril (PRINIVIL,ZESTRIL) 5 MG tablet Take 1 tablet (5 mg total) by mouth daily.  90 tablet  3  . metoprolol tartrate (LOPRESSOR) 25  MG tablet Take 1 tablet (25 mg total) by mouth 2 (two) times daily.  180 tablet  6  . potassium chloride SA (K-DUR,KLOR-CON) 20 MEQ tablet Take 1.5 tablets (30 mEq total) by mouth daily.  135 tablet  6  . rosuvastatin (CRESTOR) 40 MG tablet Take 1 tablet (40 mg total) by mouth daily at 6 (six) AM.  30 tablet  12     Past Medical History  Diagnosis Date  . Asthma   . Diabetes mellitus   . Sleep disturbance     Had sleep study several years ago, wasn't told she has OSA but told she needs O2 at night  . CAD (coronary artery disease)     NSTEMI 12/12:   LHC 09/02/11: LAD 70-80%, OM2 80%, small postero-lateral segment of the AV circumflex 60-70%, proximal to mid RCA 40%, acute margin 30%, 80% at the takeoff of the PDA.  . 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 Laneta Simmers;   LIMA-LAD, SVG-OM 2, SVG-PDA.  [name] Chronic diastolic heart failure     Echocardiogram 09/01/11: Moderate LVH, EF 55-60%, mild LAE.  [name] Carotid stenosis     dopplers 12/12: RICA 60-79%   . HLD (hyperlipidemia)   . History of chicken pox   . Complication of anesthesia   . PONV (postoperative nausea and vomiting)   . CHF (congestive heart failure)   . Myocardial infarction 08/2011    "  had 2 in December for a total of  3"  . Pneumonia ~ 2009  . Shortness of breath     "all the time"  . Stroke 2006    denies residual    Past Surgical History  Procedure Date  . Cholecystectomy 1990's  . Tubal ligation 1981  . Coronary artery bypass graft 09/07/2011    CABG X3; Procedure: CORONARY ARTERY BYPASS GRAFTING (CABG);  Surgeon: Alleen Borne, MD;  Location: [facility] OR;  Service: Open Heart Surgery;  Laterality: N/A;    History   Social History  . Marital Status: Married    Spouse Name: N/A    Number of Children: 3  . Years of Education: 16   Occupational History  . Teacher    Social History Main Topics  . Smoking status: Former Smoker -- 0.5 packs/day for 25 years    Types: Cigarettes  . Smokeless tobacco: Never Used  . Alcohol  Use: No  . Drug Use: No  . Sexually Active: No   Other Topics Concern  . Not on file   Social History Narrative   Regular exercise-yes    ROS: no fevers or chills, productive cough, hemoptysis, dysphasia, odynophagia, melena, hematochezia, dysuria, hematuria, rash, seizure activity, orthopnea, PND, pedal edema, claudication. Remaining systems are negative.  Physical Exam: Well-developed obese in no acute distress.  Skin is warm and dry.  HEENT is normal.  Neck is supple.  Chest is clear to auscultation with normal expansion.  Cardiovascular exam is regular rate and rhythm.  Abdominal exam nontender or distended. No masses palpated. Extremities show trace edema. neuro grossly intact

## 2012-10-09 NOTE — Assessment & Plan Note (Signed)
Continue aspirin and statin. Followup carotid Dopplers July 2014. 

## 2012-10-09 NOTE — Assessment & Plan Note (Signed)
Patient continues to describe congestive heart failure symptoms. Plan repeat echocardiogram. Increase Lasix to 80 mg by mouth twice a day. Increase potassium to 30 mEq by mouth twice a day. Check potassium and renal function in one week. We discussed admission today but she would prefer to do this as an outpatient. We will consider admission if her symptoms do not improve with the above therapy. Followup in 2 weeks.

## 2012-10-09 NOTE — Assessment & Plan Note (Signed)
Continue aspirin and statin. 

## 2012-10-09 NOTE — Assessment & Plan Note (Signed)
Continue statin. 

## 2012-10-09 NOTE — Patient Instructions (Addendum)
Your physician recommends that you schedule a follow-up appointment in: 2 WEEKS WITH DR CRENSHAW  INCREASE FUROSEMIDE TO 80 MG TWICE DAILY  INCREASE POTASSIUM TO 1.5 TABLETS TWICE DAILY  Your physician has requested that you have an echocardiogram. Echocardiography is a painless test that uses sound waves to create images of your heart. It provides your doctor with information about the size and shape of your heart and how well your heart's chambers and valves are working. This procedure takes approximately one hour. There are no restrictions for this procedure.    Your physician recommends that you return for lab work in: ONE WEEK

## 2012-10-09 NOTE — Assessment & Plan Note (Signed)
Continue present blood pressure medications. 

## 2012-10-11 NOTE — Progress Notes (Signed)
Quick Note:  Fu carotid dopplers six months Olga Millers  ______

## 2012-10-16 ENCOUNTER — Other Ambulatory Visit (INDEPENDENT_AMBULATORY_CARE_PROVIDER_SITE_OTHER): Payer: BC Managed Care – PPO

## 2012-10-16 ENCOUNTER — Ambulatory Visit (HOSPITAL_COMMUNITY): Payer: BC Managed Care – PPO | Attending: Internal Medicine

## 2012-10-16 ENCOUNTER — Other Ambulatory Visit: Payer: Self-pay | Admitting: Cardiology

## 2012-10-16 DIAGNOSIS — I319 Disease of pericardium, unspecified: Secondary | ICD-10-CM | POA: Insufficient documentation

## 2012-10-16 DIAGNOSIS — I252 Old myocardial infarction: Secondary | ICD-10-CM | POA: Insufficient documentation

## 2012-10-16 DIAGNOSIS — I509 Heart failure, unspecified: Secondary | ICD-10-CM

## 2012-10-16 DIAGNOSIS — I10 Essential (primary) hypertension: Secondary | ICD-10-CM | POA: Insufficient documentation

## 2012-10-16 DIAGNOSIS — I779 Disorder of arteries and arterioles, unspecified: Secondary | ICD-10-CM | POA: Insufficient documentation

## 2012-10-16 DIAGNOSIS — E785 Hyperlipidemia, unspecified: Secondary | ICD-10-CM | POA: Insufficient documentation

## 2012-10-16 LAB — BASIC METABOLIC PANEL
Calcium: 9.5 mg/dL (ref 8.4–10.5)
Creatinine, Ser: 0.8 mg/dL (ref 0.4–1.2)
GFR: 81.94 mL/min (ref >60.00)
Glucose, Bld: 346 mg/dL — ABNORMAL HIGH (ref 70–99)
Sodium: 135 mEq/L (ref 135–145)

## 2012-10-16 MED ORDER — INSULIN GLARGINE 100 UNIT/ML ~~LOC~~ SOLN: 80.0000 [IU] | Freq: Two times a day (BID) | SUBCUTANEOUS | Status: DC

## 2012-10-16 MED ORDER — INSULIN LISPRO 100 UNIT/ML ~~LOC~~ SOLN: 30.0000 [IU] | Freq: Three times a day (TID) | SUBCUTANEOUS | Status: DC

## 2012-10-16 NOTE — Telephone Encounter (Signed)
Spoke with pt, aware she will need to get refills by dr Everardo All. Pt reports she is currently out of meds and dr ellison's office has not responded. Pharm called and one refill given until dr ellison's office responds.

## 2012-10-16 NOTE — Progress Notes (Signed)
Echocardiogram performed.  

## 2012-10-16 NOTE — Telephone Encounter (Signed)
Pt rtn your call

## 2012-10-16 NOTE — Telephone Encounter (Signed)
Pt needs lantas and humalog called into [facility] [place]

## 2012-10-16 NOTE — Telephone Encounter (Signed)
Please refill x 1 Ov is due  

## 2012-10-17 MED ORDER — INSULIN LISPRO 100 UNIT/ML ~~LOC~~ SOLN: 30.0000 [IU] | Freq: Three times a day (TID) | SUBCUTANEOUS | Status: DC

## 2012-10-17 MED ORDER — INSULIN GLARGINE 100 UNIT/ML ~~LOC~~ SOLN: 80.0000 [IU] | Freq: Two times a day (BID) | SUBCUTANEOUS | Status: DC

## 2012-10-17 NOTE — Telephone Encounter (Signed)
PATIENT NOTIFIED OF NEED TO SCHEDULE APPT. PRIOR TO REFILLS , MED REFILL FOR INSULIN SENT IN TO [facility].

## 2012-10-25 ENCOUNTER — Ambulatory Visit (INDEPENDENT_AMBULATORY_CARE_PROVIDER_SITE_OTHER): Payer: BC Managed Care – PPO | Admitting: Endocrinology

## 2012-10-25 VITALS — BP 128/78 | HR 72 | Wt 306.0 lb

## 2012-10-25 DIAGNOSIS — E119 Type 2 diabetes mellitus without complications: Secondary | ICD-10-CM

## 2012-10-25 MED ORDER — INSULIN GLARGINE 100 UNIT/ML ~~LOC~~ SOLN: 50.0000 [IU] | Freq: Two times a day (BID) | SUBCUTANEOUS | Status: DC

## 2012-10-25 MED ORDER — INSULIN LISPRO 100 UNIT/ML ~~LOC~~ SOLN: SUBCUTANEOUS | Status: DC

## 2012-10-25 NOTE — Progress Notes (Signed)
  Subjective:    Patient ID: Felicia Rivas, female    DOB: 1955-10-06, 57 y.o.   MRN: [id number]  HPI pt returns for f/u of insulin-requiring DM (dx'ed 2004; complicated by retinopathy and PAD).  no cbg record, but states cbg's are sometimes low in the middle of the night.  It is highest at hs (300).     Review of Systems Denies LOC    Objective:   Physical Exam VITAL SIGNS:  See vs page GENERAL: no distress SKIN:  Insulin injection sites at the anterior abdomen are normal.         Assessment & Plan:

## 2012-10-25 NOTE — Patient Instructions (Addendum)
check your blood sugar 2 times a day.  vary the time of day when you check, between before the 3 meals, and at bedtime.  also check if you have symptoms of your blood sugar being too high or too low.  please keep a record of the readings and bring it to your next appointment here.  please call us sooner if your blood sugar goes below 70, or if it stays over 200. Please come back for a follow-up appointment in 1 month Please reduce lantus to 50 units 2x a day, and:  Increase humalog to 3x a day (just before each meal), 60-40-60 units.

## 2012-10-26 ENCOUNTER — Ambulatory Visit: Payer: BC Managed Care – PPO | Admitting: Cardiology

## 2012-11-29 ENCOUNTER — Other Ambulatory Visit: Payer: Self-pay | Admitting: Physician Assistant

## 2012-11-29 ENCOUNTER — Other Ambulatory Visit: Payer: Self-pay | Admitting: *Deleted

## 2012-11-29 MED ORDER — METOPROLOL TARTRATE 25 MG PO TABS: ORAL_TABLET | ORAL | Status: DC

## 2013-02-15 ENCOUNTER — Inpatient Hospital Stay (HOSPITAL_COMMUNITY)
Admission: EM | Admit: 2013-02-15 | Discharge: 2013-02-19 | DRG: 127 | Disposition: A | Discharge status: Home / Self Care | Payer: BC Managed Care – PPO | Attending: Cardiology | Admitting: Cardiology

## 2013-02-15 ENCOUNTER — Encounter (HOSPITAL_COMMUNITY): Payer: Self-pay | Admitting: *Deleted

## 2013-02-15 ENCOUNTER — Emergency Department (HOSPITAL_COMMUNITY): Payer: BC Managed Care – PPO

## 2013-02-15 DIAGNOSIS — I509 Heart failure, unspecified: Secondary | ICD-10-CM

## 2013-02-15 DIAGNOSIS — J45909 Unspecified asthma, uncomplicated: Secondary | ICD-10-CM | POA: Diagnosis present

## 2013-02-15 DIAGNOSIS — Z794 Long term (current) use of insulin: Secondary | ICD-10-CM

## 2013-02-15 DIAGNOSIS — Z6841 Body Mass Index (BMI) 40.0 and over, adult: Secondary | ICD-10-CM

## 2013-02-15 DIAGNOSIS — R0602 Shortness of breath: Secondary | ICD-10-CM

## 2013-02-15 DIAGNOSIS — E785 Hyperlipidemia, unspecified: Secondary | ICD-10-CM

## 2013-02-15 DIAGNOSIS — Z8673 Personal history of transient ischemic attack (TIA), and cerebral infarction without residual deficits: Secondary | ICD-10-CM

## 2013-02-15 DIAGNOSIS — Z833 Family history of diabetes mellitus: Secondary | ICD-10-CM

## 2013-02-15 DIAGNOSIS — I2510 Atherosclerotic heart disease of native coronary artery without angina pectoris: Secondary | ICD-10-CM

## 2013-02-15 DIAGNOSIS — H3500 Unspecified background retinopathy: Secondary | ICD-10-CM | POA: Diagnosis present

## 2013-02-15 DIAGNOSIS — I10 Essential (primary) hypertension: Secondary | ICD-10-CM

## 2013-02-15 DIAGNOSIS — I779 Disorder of arteries and arterioles, unspecified: Secondary | ICD-10-CM | POA: Diagnosis present

## 2013-02-15 DIAGNOSIS — I5033 Acute on chronic diastolic (congestive) heart failure: Principal | ICD-10-CM

## 2013-02-15 DIAGNOSIS — E119 Type 2 diabetes mellitus without complications: Secondary | ICD-10-CM | POA: Diagnosis present

## 2013-02-15 DIAGNOSIS — I6529 Occlusion and stenosis of unspecified carotid artery: Secondary | ICD-10-CM | POA: Diagnosis present

## 2013-02-15 DIAGNOSIS — Z79899 Other long term (current) drug therapy: Secondary | ICD-10-CM

## 2013-02-15 DIAGNOSIS — Z951 Presence of aortocoronary bypass graft: Secondary | ICD-10-CM

## 2013-02-15 DIAGNOSIS — Z87891 Personal history of nicotine dependence: Secondary | ICD-10-CM

## 2013-02-15 DIAGNOSIS — I252 Old myocardial infarction: Secondary | ICD-10-CM

## 2013-02-15 DIAGNOSIS — Z8249 Family history of ischemic heart disease and other diseases of the circulatory system: Secondary | ICD-10-CM

## 2013-02-15 HISTORY — DX: Cough, unspecified: R05.9

## 2013-02-15 HISTORY — DX: Morbid (severe) obesity due to excess calories: E66.01

## 2013-02-15 HISTORY — DX: Cough: R05

## 2013-02-15 LAB — CBC WITH DIFFERENTIAL/PLATELET
Basophils Absolute: 0 10*3/uL (ref 0.0–0.1)
Basophils Relative: 0 % (ref 0–1)
Eosinophils Absolute: 0.3 10*3/uL (ref 0.0–0.7)
Lymphs Abs: 2.4 10*3/uL (ref 0.7–4.0)
MCH: 29.7 pg (ref 26.0–34.0)
MCHC: 33.4 g/dL (ref 30.0–36.0)
Neutrophils Relative %: 65 % (ref 43–77)
Platelets: 238 10*3/uL (ref 150–400)
RBC: 4.34 MIL/uL (ref 3.87–5.11)

## 2013-02-15 LAB — COMPREHENSIVE METABOLIC PANEL
ALT: 19 U/L (ref 0–35)
AST: 23 U/L (ref 0–37)
Albumin: 3.3 g/dL — ABNORMAL LOW (ref 3.5–5.2)
Alkaline Phosphatase: 124 U/L — ABNORMAL HIGH (ref 39–117)
Potassium: 4.1 mEq/L (ref 3.5–5.1)
Sodium: 142 mEq/L (ref 135–145)
Total Protein: 6.6 g/dL (ref 6.0–8.3)

## 2013-02-15 MED ORDER — ALBUTEROL SULFATE (5 MG/ML) 0.5% IN NEBU
2.5000 mg | INHALATION_SOLUTION | RESPIRATORY_TRACT | Status: DC
  Administered 2013-02-16: 2.5 mg via RESPIRATORY_TRACT
  Filled 2013-02-15: qty 0.5

## 2013-02-15 MED ORDER — INSULIN GLARGINE 100 UNIT/ML ~~LOC~~ SOLN
50.0000 [IU] | Freq: Two times a day (BID) | SUBCUTANEOUS | Status: DC
  Administered 2013-02-16 – 2013-02-19 (×8): 50 [IU] via SUBCUTANEOUS
  Filled 2013-02-15 (×9): qty 0.5

## 2013-02-15 MED ORDER — ALBUTEROL SULFATE (5 MG/ML) 0.5% IN NEBU
5.0000 mg | INHALATION_SOLUTION | Freq: Once | RESPIRATORY_TRACT | Status: AC
  Administered 2013-02-15: 5 mg via RESPIRATORY_TRACT
  Filled 2013-02-15: qty 0.5
  Filled 2013-02-15: qty 1

## 2013-02-15 MED ORDER — ACETAMINOPHEN 325 MG PO TABS: 650.0000 mg | ORAL_TABLET | ORAL | Status: DC | PRN

## 2013-02-15 MED ORDER — ASPIRIN EC 325 MG PO TBEC
325.0000 mg | DELAYED_RELEASE_TABLET | Freq: Every day | ORAL | Status: DC
Start: 2013-02-16 — End: 2013-02-19
  Administered 2013-02-16 – 2013-02-19 (×4): 325 mg via ORAL
  Filled 2013-02-15 (×4): qty 1

## 2013-02-15 MED ORDER — ALBUTEROL SULFATE (5 MG/ML) 0.5% IN NEBU
5.0000 mg | INHALATION_SOLUTION | Freq: Once | RESPIRATORY_TRACT | Status: AC
  Administered 2013-02-15: 5 mg via RESPIRATORY_TRACT
  Filled 2013-02-15: qty 1

## 2013-02-15 MED ORDER — SODIUM CHLORIDE 0.9 % IV SOLN: 250.0000 mL | INTRAVENOUS | Status: DC | PRN

## 2013-02-15 MED ORDER — METOPROLOL TARTRATE 12.5 MG HALF TABLET
12.5000 mg | ORAL_TABLET | Freq: Two times a day (BID) | ORAL | Status: DC
  Administered 2013-02-16 (×3): 12.5 mg via ORAL
  Filled 2013-02-15 (×5): qty 1

## 2013-02-15 MED ORDER — FUROSEMIDE 10 MG/ML IJ SOLN: 40.0000 mg | Freq: Two times a day (BID) | INTRAMUSCULAR | Status: DC

## 2013-02-15 MED ORDER — HEPARIN SODIUM (PORCINE) 5000 UNIT/ML IJ SOLN
5000.0000 [IU] | Freq: Three times a day (TID) | INTRAMUSCULAR | Status: DC
  Administered 2013-02-16 – 2013-02-19 (×10): 5000 [IU] via SUBCUTANEOUS
  Filled 2013-02-15 (×13): qty 1

## 2013-02-15 MED ORDER — LISINOPRIL 5 MG PO TABS
5.0000 mg | ORAL_TABLET | Freq: Every day | ORAL | Status: DC
  Filled 2013-02-15: qty 1

## 2013-02-15 MED ORDER — IPRATROPIUM BROMIDE 0.02 % IN SOLN
0.5000 mg | Freq: Once | RESPIRATORY_TRACT | Status: AC
  Administered 2013-02-15: 0.5 mg via RESPIRATORY_TRACT
  Filled 2013-02-15: qty 2.5

## 2013-02-15 MED ORDER — IPRATROPIUM BROMIDE 0.02 % IN SOLN
0.5000 mg | RESPIRATORY_TRACT | Status: DC
  Administered 2013-02-16: 0.5 mg via RESPIRATORY_TRACT
  Filled 2013-02-15: qty 2.5

## 2013-02-15 MED ORDER — ONDANSETRON HCL 4 MG/2ML IJ SOLN: 4.0000 mg | Freq: Four times a day (QID) | INTRAMUSCULAR | Status: DC | PRN

## 2013-02-15 MED ORDER — SODIUM CHLORIDE 0.9 % IJ SOLN
3.0000 mL | INTRAMUSCULAR | Status: DC | PRN
  Administered 2013-02-19: 3 mL via INTRAVENOUS

## 2013-02-15 MED ORDER — SODIUM CHLORIDE 0.9 % IJ SOLN
3.0000 mL | Freq: Two times a day (BID) | INTRAMUSCULAR | Status: DC
  Administered 2013-02-16 – 2013-02-18 (×7): 3 mL via INTRAVENOUS

## 2013-02-15 NOTE — ED Provider Notes (Signed)
History     CSN: [id number]  Arrival date & time 02/15/13  1549   First MD Initiated Contact with Patient 02/15/13 1559      Chief Complaint  Patient presents with  . Shortness of Breath     HPI Reports being diagnosed with pneumonia recently, finished antibiotics on wed and still having productive cough, sob and not feeling well. Airway intact at triage, spo2 95%  Past Medical History  Diagnosis Date  . Asthma   . Diabetes mellitus   . Sleep disturbance     Had sleep study several years ago, wasn't told she has OSA but told she needs O2 at night  . CAD (coronary artery disease)     NSTEMI 12/12:   LHC 09/02/11: LAD 70-80%, OM2 80%, small postero-lateral segment of the AV circumflex 60-70%, proximal to mid RCA 40%, acute margin 30%, 80% at the takeoff of the PDA.  . 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 Laneta Simmers;   LIMA-LAD, SVG-OM 2, SVG-PDA.  [name] Chronic diastolic heart failure     Echocardiogram 09/01/11: Moderate LVH, EF 55-60%, mild LAE.  [name] Carotid stenosis     dopplers 12/12: RICA 60-79%   . HLD (hyperlipidemia)   . History of chicken pox   . Complication of anesthesia   . PONV (postoperative nausea and vomiting)   . CHF (congestive heart failure)   . Myocardial infarction 08/2011    "had 2 in December for a total of  3"  . Pneumonia ~ 2009  . Shortness of breath     "all the time"  . Stroke 2006    denies residual    Past Surgical History  Procedure Laterality Date  . Cholecystectomy  1990's  . Tubal ligation  1981  . Coronary artery bypass graft  09/07/2011    CABG X3; Procedure: CORONARY ARTERY BYPASS GRAFTING (CABG);  Surgeon: Alleen Borne, MD;  Location: [facility] OR;  Service: Open Heart Surgery;  Laterality: N/A;    Family History  Problem Relation Age of Onset  . Cancer Mother   . Heart failure Mother     Also has hx MVP s/p MVR  . Diabetes Mother   . Coronary artery disease Father     Died of MI at age 28  . Heart disease Other     Parent    History   Substance Use Topics  . Smoking status: Former Smoker -- 0.50 packs/day for 25 years    Types: Cigarettes  . Smokeless tobacco: Never Used  . Alcohol Use: No    OB History   Grav Para Term Preterm Abortions TAB SAB Ect Mult Living                  Review of Systems All other systems reviewed and are negativ Allergies  Review of patient's allergies indicates no known allergies.  Home Medications   Current Outpatient Rx  Name  Route  Sig  Dispense  Refill  . albuterol (PROVENTIL HFA;VENTOLIN HFA) 108 (90 BASE) MCG/ACT inhaler   Inhalation   Inhale 1 puff into the lungs every 4 (four) hours as needed for shortness of breath.         [name] albuterol (PROVENTIL) (2.5 MG/3ML) 0.083% nebulizer solution   Nebulization   Take 2.5 mg by nebulization 2 (two) times daily as needed. For shortness of breath and wheezing         . aspirin EC 325 MG EC tablet   Oral  Take 1 tablet (325 mg total) by mouth daily.   30 tablet      . furosemide (LASIX) 40 MG tablet   Oral   Take 2 tablets (80 mg total) by mouth 2 (two) times daily.   60 tablet   12   . ibuprofen (ADVIL,MOTRIN) 200 MG tablet   Oral   Take 800 mg by mouth 2 (two) times daily as needed. For pain         . insulin glargine (LANTUS) 100 UNIT/ML injection   Subcutaneous   Inject 50 Units into the skin 2 (two) times daily.   40 mL   11   . insulin lispro (HUMALOG) 100 UNIT/ML injection      3x a day (just before each meal), 60-40-60 units.   60 mL   11   . lisinopril (PRINIVIL,ZESTRIL) 5 MG tablet   Oral   Take 1 tablet (5 mg total) by mouth daily.   90 tablet   3   . metoprolol tartrate (LOPRESSOR) 25 MG tablet   Oral   Take 25 mg by mouth 2 (two) times daily.         . potassium chloride SA (K-DUR,KLOR-CON) 20 MEQ tablet   Oral   Take 3 tablets (60 mEq total) by mouth 2 (two) times daily.   90 tablet   12     BP 154/56  Pulse 74  Temp(Src) 98.6 F (37 C) (Oral)  Resp 21  SpO2  97%  Physical Exam  Nursing note and vitals reviewed. Constitutional: She is oriented to person, place, and time. She appears well-developed and well-nourished. No distress.  HENT:  Head: Normocephalic and atraumatic.  Eyes: Pupils are equal, round, and reactive to light.  Neck: Normal range of motion.  Cardiovascular: Normal rate and intact distal pulses.   Pulmonary/Chest: No respiratory distress. She has wheezes.  Abdominal: Normal appearance. She exhibits no distension. There is no tenderness. There is no rebound.  Musculoskeletal: Normal range of motion.  Neurological: She is alert and oriented to person, place, and time. No cranial nerve deficit.  Skin: Skin is warm and dry. No rash noted.  Psychiatric: She has a normal mood and affect. Her behavior is normal.    ED Course  Procedures (including critical care time)  Date: 02/15/2013  Rate: 75  Rhythm: normal sinus rhythm  QRS Axis: normal  Intervals: normal  ST/T Wave abnormalities: T wave inversion in lead V6 which appears new from previous tracing  Conduction Disutrbances: none  Narrative Interpretation: Nonspecific T wave abnormalities  Medications  albuterol (PROVENTIL) (5 MG/ML) 0.5% nebulizer solution 5 mg (5 mg Nebulization Given 02/15/13 1630)  ipratropium (ATROVENT) nebulizer solution 0.5 mg (0.5 mg Nebulization Given 02/15/13 1630)  albuterol (PROVENTIL) (5 MG/ML) 0.5% nebulizer solution 5 mg (5 mg Nebulization Given 02/15/13 2024)      Labs Reviewed  PRO B NATRIURETIC PEPTIDE - Abnormal; Notable for the following:    Pro B Natriuretic peptide (BNP) 325.6 (*)    All other components within normal limits  COMPREHENSIVE METABOLIC PANEL - Abnormal; Notable for the following:    Glucose, Bld 148 (*)    Albumin 3.3 (*)    Alkaline Phosphatase 124 (*)    Total Bilirubin 0.2 (*)    All other components within normal limits  CBC WITH DIFFERENTIAL  POCT I-STAT TROPONIN I   Dg Chest 2 View  02/15/2013    *RADIOLOGY REPORT*  Clinical Data: Cough and shortness of breath for 3  weeks. Diagnosed with pneumonia 3 weeks ago.  Ex-smoker.  CHEST - 2 VIEW  Comparison: 10/04/2011  Findings: Prior median sternotomy. Midline trachea.  Mild cardiomegaly. Mediastinal contours otherwise within normal limits. No pleural effusion or pneumothorax. Mild interstitial prominence which is new or increased. No lobar consolidation.  IMPRESSION: Cardiomegaly with new or increased interstitial prominence.  This could represent the sequelae of chronic bronchitis/smoking.  Mild pulmonary venous congestion is felt less likely, given absence of pleural fluid.  No evidence of pneumonia.   Original Report Authenticated By: Jeronimo Greaves, M.D.     1. Shortness of breath       MDM   Patient seen by cardiology who elected to admit her for observation.      Nelia Shi, MD 02/15/13 [phone]

## 2013-02-15 NOTE — H&P (Signed)
Cardiology History and Physical  No PCP Per Patient  History of Present Illness (and review of medical records): Felicia Rivas is a 57 y.o. female who presents for evaluation of shortness of breath.  She is followed by Dr. Jens Som for known CAD. LHC 09/02/11: LAD 70-80%, OM2 80%, small postero-lateral segment of the AV circumflex 60-70%, proximal to mid RCA 40%, acute margin 30%, 80% at the takeoff of the PDA. Echocardiogram 09/01/11: Moderate LVH, EF 55-60%, mild LAE. Given her three-vessel CAD, she was referred for CABG with Dr. Laneta Simmers. CABG 09/07/11: LIMA-LAD, SVG-OM 2, SVG-PDA. Last carotid Dopplers in January of 2014 showed a 60-79% right and 40-59% left stenosis.  She was last seen twice in Jan 2014 for as she was complaining of increasing dyspnea. Lasix increased and lisinopril added. She declined inpatient admission had some improvement with outpatient therapy. She has for the past month again complaints of dyspnea.  She also reports PND, orthopnea, LE swelling, wheezing and now production of pinkish sputum.  She is usually on prn albuterol, but frequency has recently increased due to current symptoms.  She denies any chest pain.  She has not had chest pain, only dyspnea with prior cardiac events.  She was recently seen and placed on antibiotics for suspected PNA, however, she has completed therapy and symptoms have persisted which brings her to ED today for evaluation.  Review of Systems No fevers, chills, night sweats. Further review of systems was otherwise negative other than stated in HPI.  Patient Active Problem List   Diagnosis Date Noted  . Preventative health care 11/26/2011  . Hypertension 11/25/2011  . Incisional infection 11/03/2011  . Retinopathy 10/18/2011  . CAD (coronary artery disease)   . Chronic diastolic heart failure   . Carotid stenosis   . HLD (hyperlipidemia)   . 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 Obesity 09/01/2011  . Asthma 08/31/2011  . DM2 (diabetes mellitus,  type 2) 08/31/2011   Past Medical History  Diagnosis Date  . Asthma   . Diabetes mellitus   . Sleep disturbance     Had sleep study several years ago, wasn't told she has OSA but told she needs O2 at night  . CAD (coronary artery disease)     NSTEMI 12/12:   LHC 09/02/11: LAD 70-80%, OM2 80%, small postero-lateral segment of the AV circumflex 60-70%, proximal to mid RCA 40%, acute margin 30%, 80% at the takeoff of the PDA.  . 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 Laneta Simmers;   LIMA-LAD, SVG-OM 2, SVG-PDA.  [name] Chronic diastolic heart failure     Echocardiogram 09/01/11: Moderate LVH, EF 55-60%, mild LAE.  [name] Carotid stenosis     dopplers 12/12: RICA 60-79%   . HLD (hyperlipidemia)   . History of chicken pox   . Complication of anesthesia   . PONV (postoperative nausea and vomiting)   . CHF (congestive heart failure)   . Myocardial infarction 08/2011    "had 2 in December for a total of  3"  . Pneumonia ~ 2009  . Shortness of breath     "all the time"  . Stroke 2006    denies residual    Past Surgical History  Procedure Laterality Date  . Cholecystectomy  1990's  . Tubal ligation  1981  . Coronary artery bypass graft  09/07/2011    CABG X3; Procedure: CORONARY ARTERY BYPASS GRAFTING (CABG);  Surgeon: Alleen Borne, MD;  Location: [facility] OR;  Service: Open Heart Surgery;  Laterality: N/A;     (Not in a hospital admission) No Known Allergies  History  Substance Use Topics  . Smoking status: Former Smoker -- 0.50 packs/day for 25 years    Types: Cigarettes  . Smokeless tobacco: Never Used  . Alcohol Use: No    Family History  Problem Relation Age of Onset  . Cancer Mother   . Heart failure Mother     Also has hx MVP s/p MVR  . Diabetes Mother   . Coronary artery disease Father     Died of MI at age 55  . Heart disease Other     Parent     Objective: Patient Vitals for the past 8 hrs:  BP Temp Pulse Resp SpO2  02/15/13 2024 - - - - 96 %  02/15/13 1933 166/60 mmHg - - 22 97 %   02/15/13 1651 149/93 mmHg - 97 19 96 %  02/15/13 1631 - - - - 95 %  02/15/13 1554 140/79 mmHg 98.4 F (36.9 C) 85 - 95 %  02/15/13 1552 - - 88 22 95 %   General Appearance:    Alert, cooperative, no distress, obese female  Head:    Normocephalic, without obvious abnormality, atraumatic  Eyes:     Anicteric sclerae  Neck:   Supple  Lungs:     Expiratory wheezing  Heart:    Regular rate and rhythm, S1 and S2 normal,   Abdomen:     Soft, non-tender, normoactive bowel sounds  Extremities:   BLE edema  Pulses:   2+ and symmetric all extremities  Skin:   no rashes or lesions  Neurologic:   No focal deficits. AAO x3   Results for orders placed during the hospital encounter of 02/15/13 (from the past 48 hour(s))  PRO B NATRIURETIC PEPTIDE     Status: Abnormal   Collection Time    02/15/13  4:20 PM      Result Value Range   Pro B Natriuretic peptide (BNP) 325.6 (*) 0 - 125 pg/mL  CBC WITH DIFFERENTIAL     Status: None   Collection Time    02/15/13  4:20 PM      Result Value Range   WBC 9.5  4.0 - 10.5 K/uL   RBC 4.34  3.87 - 5.11 MIL/uL   Hemoglobin 12.9  12.0 - 15.0 g/dL   HCT 19.1  47.8 - 29.5 %   MCV 88.9  78.0 - 100.0 fL   MCH 29.7  26.0 - 34.0 pg   MCHC 33.4  30.0 - 36.0 g/dL   RDW 62.1  30.8 - 65.7 %   Platelets 238  150 - 400 K/uL   Neutrophils Relative % 65  43 - 77 %   Neutro Abs 6.2  1.7 - 7.7 K/uL   Lymphocytes Relative 25  12 - 46 %   Lymphs Abs 2.4  0.7 - 4.0 K/uL   Monocytes Relative 7  3 - 12 %   Monocytes Absolute 0.7  0.1 - 1.0 K/uL   Eosinophils Relative 3  0 - 5 %   Eosinophils Absolute 0.3  0.0 - 0.7 K/uL   Basophils Relative 0  0 - 1 %   Basophils Absolute 0.0  0.0 - 0.1 K/uL  COMPREHENSIVE METABOLIC PANEL     Status: Abnormal   Collection Time    02/15/13  4:20 PM      Result Value Range   Sodium 142  135 - 145 mEq/L  Potassium 4.1  3.5 - 5.1 mEq/L   Chloride 105  96 - 112 mEq/L   CO2 28  19 - 32 mEq/L   Glucose, Bld 148 (*) 70 - 99 mg/dL   BUN  15  6 - 23 mg/dL   Creatinine, Ser 1.61  0.50 - 1.10 mg/dL   Calcium 9.0  8.4 - 09.6 mg/dL   Total Protein 6.6  6.0 - 8.3 g/dL   Albumin 3.3 (*) 3.5 - 5.2 g/dL   AST 23  0 - 37 U/L   ALT 19  0 - 35 U/L   Alkaline Phosphatase 124 (*) 39 - 117 U/L   Total Bilirubin 0.2 (*) 0.3 - 1.2 mg/dL   GFR calc non Af Amer >90  >90 mL/min   GFR calc Af Amer >90  >90 mL/min   Comment:            The eGFR has been calculated     using the CKD EPI equation.     This calculation has not been     validated in all clinical     situations.     eGFR's persistently     <90 mL/min signify     possible Chronic Kidney Disease.  POCT I-STAT TROPONIN I     Status: None   Collection Time    02/15/13  5:03 PM      Result Value Range   Troponin i, poc 0.00  0.00 - 0.08 ng/mL   Comment 3            Comment: Due to the release kinetics of cTnI,     a negative result within the first hours     of the onset of symptoms does not rule out     myocardial infarction with certainty.     If myocardial infarction is still suspected,     repeat the test at appropriate intervals.   Dg Chest 2 View  02/15/2013   *RADIOLOGY REPORT*  Clinical Data: Cough and shortness of breath for 3 weeks. Diagnosed with pneumonia 3 weeks ago.  Ex-smoker.  CHEST - 2 VIEW  Comparison: 10/04/2011  Findings: Prior median sternotomy. Midline trachea.  Mild cardiomegaly. Mediastinal contours otherwise within normal limits. No pleural effusion or pneumothorax. Mild interstitial prominence which is new or increased. No lobar consolidation.  IMPRESSION: Cardiomegaly with new or increased interstitial prominence.  This could represent the sequelae of chronic bronchitis/smoking.  Mild pulmonary venous congestion is felt less likely, given absence of pleural fluid.  No evidence of pneumonia.   Original Report Authenticated By: Jeronimo Greaves, M.D.    ECG:  Sinus rhythm, nonspecific T wave changes, no significant change from prior  Assessment: 72F with  known CAD s/p 3v CABG, chronic DHF, HTN, HLD, morbid obesity, asthma presents with progressive symptoms which appear to be related to HF.  Symptoms less likely due to PNA, Asthma exacerbation, although underlying asthma likely affected by HF.  No clear ischemic signs, however, patient known to present atypically in the past.  Initial troponin negative and ecg relatively unchanged.  Will r/o ACS and  treat for HF.   Plan: 1. Cardiology Admission  2. Continuous monitoring on Telemetry. 3. Repeat ekg on admit, prn chest pain or arrythmia 4. Trend cardiac biomarkers, check lipids, hgba1c, tsh 5. Medical management to include ASA,BB,ACEi. 6. Lasix 40mg  IV BID, Strict I/Os, daily weights 7. TTE in am, last echo 6months ago 8. Duonebs prn 9. Home meds for DM

## 2013-02-15 NOTE — ED Notes (Signed)
Reports being diagnosed with pneumonia recently, finished antibiotics on wed and still having productive cough, sob and not feeling well. Airway intact at triage, spo2 95%

## 2013-02-15 NOTE — ED Notes (Signed)
Pt's O2 sats remained between 93 and 96% while ambulating. Pt states that she is feeling short of breath after ambulating in the hall.

## 2013-02-15 NOTE — ED Notes (Signed)
Pt placed back on the monitor.

## 2013-02-16 DIAGNOSIS — E785 Hyperlipidemia, unspecified: Secondary | ICD-10-CM

## 2013-02-16 DIAGNOSIS — I10 Essential (primary) hypertension: Secondary | ICD-10-CM

## 2013-02-16 DIAGNOSIS — I5033 Acute on chronic diastolic (congestive) heart failure: Principal | ICD-10-CM

## 2013-02-16 DIAGNOSIS — I2510 Atherosclerotic heart disease of native coronary artery without angina pectoris: Secondary | ICD-10-CM

## 2013-02-16 LAB — MAGNESIUM: Magnesium: 2.1 mg/dL (ref 1.5–2.5)

## 2013-02-16 LAB — GLUCOSE, CAPILLARY
Glucose-Capillary: 173 mg/dL — ABNORMAL HIGH (ref 70–99)
Glucose-Capillary: 220 mg/dL — ABNORMAL HIGH (ref 70–99)
Glucose-Capillary: 178 mg/dL — ABNORMAL HIGH (ref 70–99)

## 2013-02-16 LAB — BASIC METABOLIC PANEL
BUN: 15 mg/dL (ref 6–23)
CO2: 33 mEq/L — ABNORMAL HIGH (ref 19–32)
Calcium: 9.6 mg/dL (ref 8.4–10.5)
Creatinine, Ser: 0.72 mg/dL (ref 0.50–1.10)
GFR calc Af Amer: >90 mL/min (ref >90)

## 2013-02-16 MED ORDER — LOSARTAN POTASSIUM 25 MG PO TABS
25.0000 mg | ORAL_TABLET | Freq: Every day | ORAL | Status: DC
  Administered 2013-02-17 – 2013-02-19 (×3): 25 mg via ORAL
  Filled 2013-02-16 (×3): qty 1

## 2013-02-16 MED ORDER — ALBUTEROL SULFATE (5 MG/ML) 0.5% IN NEBU: 2.5000 mg | INHALATION_SOLUTION | RESPIRATORY_TRACT | Status: DC

## 2013-02-16 MED ORDER — IPRATROPIUM BROMIDE 0.02 % IN SOLN
0.5000 mg | Freq: Three times a day (TID) | RESPIRATORY_TRACT | Status: DC
  Administered 2013-02-16 – 2013-02-19 (×9): 0.5 mg via RESPIRATORY_TRACT
  Filled 2013-02-16 (×8): qty 2.5

## 2013-02-16 MED ORDER — ALBUTEROL SULFATE (5 MG/ML) 0.5% IN NEBU
2.5000 mg | INHALATION_SOLUTION | Freq: Three times a day (TID) | RESPIRATORY_TRACT | Status: DC
  Administered 2013-02-16 – 2013-02-19 (×9): 2.5 mg via RESPIRATORY_TRACT
  Filled 2013-02-16 (×8): qty 0.5

## 2013-02-16 MED ORDER — BENZONATATE 100 MG PO CAPS
200.0000 mg | ORAL_CAPSULE | Freq: Three times a day (TID) | ORAL | Status: DC | PRN
  Administered 2013-02-16 – 2013-02-19 (×6): 200 mg via ORAL
  Filled 2013-02-16 (×7): qty 2

## 2013-02-16 MED ORDER — DEXTROMETHORPHAN POLISTIREX 30 MG/5ML PO LQCR
5.0000 mL | Freq: Two times a day (BID) | ORAL | Status: DC | PRN
  Administered 2013-02-16 – 2013-02-17 (×3): 30 mg via ORAL
  Filled 2013-02-16 (×3): qty 5

## 2013-02-16 MED ORDER — IPRATROPIUM BROMIDE 0.02 % IN SOLN: 0.5000 mg | Freq: Three times a day (TID) | RESPIRATORY_TRACT | Status: DC

## 2013-02-16 MED ORDER — FUROSEMIDE 10 MG/ML IJ SOLN
40.0000 mg | Freq: Two times a day (BID) | INTRAMUSCULAR | Status: DC
  Administered 2013-02-16 – 2013-02-17 (×5): 40 mg via INTRAVENOUS
  Filled 2013-02-16 (×8): qty 4

## 2013-02-16 MED ORDER — DEXTROMETHORPHAN POLISTIREX 30 MG/5ML PO LQCR
5.0000 mL | Freq: Three times a day (TID) | ORAL | Status: DC | PRN
  Filled 2013-02-16: qty 5

## 2013-02-16 MED ORDER — ALBUTEROL SULFATE (5 MG/ML) 0.5% IN NEBU
2.5000 mg | INHALATION_SOLUTION | RESPIRATORY_TRACT | Status: DC | PRN
  Administered 2013-02-16: 2.5 mg via RESPIRATORY_TRACT
  Filled 2013-02-16: qty 0.5

## 2013-02-16 NOTE — Progress Notes (Signed)
  Echocardiogram 2D Echocardiogram has been performed.  Harriett Azar FRANCES 02/16/2013, 11:48 AM

## 2013-02-16 NOTE — Progress Notes (Signed)
Primary cardiologist: Dr. Olga Millers  Subjective:   Starting to feel somewhat better today. Still has intermittent cough. States her legs have been swelling for quite some time. Her weight has also been.   Objective:   Temp:  [98.2 F (36.8 C)-98.6 F (37 C)] 98.4 F (36.9 C) (05/31 0444) Pulse Rate:  [72-97] 85 (05/31 0444) Resp:  [19-33] 22 (05/31 0444) BP: (140-174)/(52-93) 174/69 mmHg (05/31 0444) SpO2:  [95 %-100 %] 97 % (05/31 0600) Weight:  [320 lb 4.8 oz (145.287 kg)-324 lb 1.6 oz (147.011 kg)] 320 lb 4.8 oz (145.287 kg) (05/31 0448) Last BM Date: 02/15/13  Filed Weights   02/16/13 0006 02/16/13 0448  Weight: 324 lb 1.6 oz (147.011 kg) 320 lb 4.8 oz (145.287 kg)    Intake/Output Summary (Last 24 hours) at 02/16/13 0938 Last data filed at 02/16/13 0850  Gross per 24 hour  Intake    460 ml  Output   1700 ml  Net  -1240 ml   Telemetry: Sinus rhythm.  Exam:  General: Morbidly obese, comfortable at rest.  Lungs: Decreased but generally clear.  Cardiac: RRR, no gallop.  Extremities: Chronic appearing edema.  Lab Results:  Basic Metabolic Panel:  Recent Labs Lab 02/15/13 1620 02/16/13 0520  NA 142 145  K 4.1 4.4  CL 105 105  CO2 28 33*  GLUCOSE 148* 186*  BUN 15 15  CREATININE 0.73 0.72  CALCIUM 9.0 9.6  MG  --  2.1    Liver Function Tests:  Recent Labs Lab 02/15/13 1620  AST 23  ALT 19  ALKPHOS 124*  BILITOT 0.2*  PROT 6.6  ALBUMIN 3.3*    CBC:  Recent Labs Lab 02/15/13 1620  WBC 9.5  HGB 12.9  HCT 38.6  MCV 88.9  PLT 238    Cardiac Enzymes:  Recent Labs Lab 02/16/13 0102 02/16/13 0520  TROPONINI <0.30 <0.30    BNP:  Recent Labs  09/21/12 1531 02/15/13 1620  PROBNP 191.0* 325.6*    ECG:  Sinus rhythm with LVH, leftward axis.  Chest x-ray 5/30: IMPRESSION: Cardiomegaly with new or increased interstitial prominence. This could represent the sequelae of chronic bronchitis/smoking. Mild pulmonary  venous congestion is felt less likely, given absence of pleural fluid.  No evidence of pneumonia.   Medications:   Scheduled Medications: . albuterol  2.5 mg Nebulization TID   And  . ipratropium  0.5 mg Nebulization TID  . aspirin EC  325 mg Oral Daily  . furosemide  40 mg Intravenous BID  . heparin  5,000 Units Subcutaneous Q8H  . insulin glargine  50 Units Subcutaneous BID  . lisinopril  5 mg Oral Daily  . metoprolol tartrate  12.5 mg Oral BID  . sodium chloride  3 mL Intravenous Q12H     PRN Medications:  sodium chloride, acetaminophen, albuterol, ondansetron (ZOFRAN) IV, sodium chloride   Assessment:   1. Presentation with cough and chest congestion, shortness of breath, baseline increasing weight with leg edema. She states that she was managed as an outpatient for possible pneumonia within the last month, just recently completed antibiotics. She has had no fevers or chills, chest x-ray shows no infiltrates. Current plan is management for probable acute on chronic diastolic heart failure.  2. 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 LVEF 55-60% with moderate LVH as of January 2014.  3. Bilateral carotid artery disease.  4. Hypertension.  5. Hyperlipidemia, on  statin therapy.   Plan/Discussion:    Continue IV Lasix with good diuresis. Followup echocardiogram to reassess systolic and diastolic function. Will change lisinopril to Cozaar in case any component of her cough is related. Continue to adjust medications for optimal heart rate and blood pressure control.   Jonelle Sidle, M.D., F.A.C.C.

## 2013-02-17 LAB — CBC
HCT: 43 % (ref 36.0–46.0)
Hemoglobin: 14.4 g/dL (ref 12.0–15.0)
MCH: 29.9 pg (ref 26.0–34.0)
MCV: 89.4 fL (ref 78.0–100.0)
RBC: 4.81 MIL/uL (ref 3.87–5.11)

## 2013-02-17 LAB — GLUCOSE, CAPILLARY: Glucose-Capillary: 248 mg/dL — ABNORMAL HIGH (ref 70–99)

## 2013-02-17 LAB — BASIC METABOLIC PANEL
BUN: 14 mg/dL (ref 6–23)
CO2: 30 mEq/L (ref 19–32)
Glucose, Bld: 151 mg/dL — ABNORMAL HIGH (ref 70–99)
Potassium: 3.7 mEq/L (ref 3.5–5.1)
Sodium: 137 mEq/L (ref 135–145)

## 2013-02-17 MED ORDER — METOPROLOL SUCCINATE ER 25 MG PO TB24
25.0000 mg | ORAL_TABLET | Freq: Every day | ORAL | Status: DC
  Administered 2013-02-17: 25 mg via ORAL
  Filled 2013-02-17 (×3): qty 1

## 2013-02-17 MED ORDER — DM-GUAIFENESIN ER 30-600 MG PO TB12
1.0000 | ORAL_TABLET | Freq: Two times a day (BID) | ORAL | Status: DC | PRN
  Administered 2013-02-17: 1 via ORAL
  Filled 2013-02-17 (×2): qty 1

## 2013-02-17 NOTE — Plan of Care (Signed)
Problem: Phase I Progression Outcomes Goal: Dyspnea controlled at rest (HF) Outcome: Completed/Met Date Met:  02/17/13 Pt has not had any c/o SOB while at rest Goal: Initial discharge plan identified Outcome: Completed/Met Date Met:  02/17/13 Initial plan for dc is to return home with family

## 2013-02-17 NOTE — Plan of Care (Signed)
Problem: Phase I Progression Outcomes Goal: EF % per last Echo/documented,Core Reminder form on chart Outcome: Completed/Met Date Met:  02/17/13 EF 55-60 % frin 02/16/13

## 2013-02-17 NOTE — Progress Notes (Signed)
Primary cardiologist: Dr. Olga Millers  Subjective:   Has had some coughing this morning. States it is "frothy." No chest pain. Did some walking in the hall yesterday.   Objective:   Temp:  [98 F (36.7 C)-98.5 F (36.9 C)] 98 F (36.7 C) (06/01 0430) Pulse Rate:  [73-82] 73 (06/01 0430) Resp:  [22] 22 (06/01 0430) BP: (137-180)/(58-83) 137/83 mmHg (06/01 0430) SpO2:  [92 %-98 %] 94 % (06/01 0430) Weight:  [315 lb 7.7 oz (143.1 kg)] 315 lb 7.7 oz (143.1 kg) (06/01 0430) Last BM Date: 02/15/13  Filed Weights   02/16/13 0006 02/16/13 0448 02/17/13 0430  Weight: 324 lb 1.6 oz (147.011 kg) 320 lb 4.8 oz (145.287 kg) 315 lb 7.7 oz (143.1 kg)    Intake/Output Summary (Last 24 hours) at 02/17/13 0719 Last data filed at 02/17/13 0519  Gross per 24 hour  Intake    838 ml  Output   4000 ml  Net  -3162 ml   Telemetry: Sinus rhythm, artifact.  Exam:  General: Morbidly obese, comfortable at rest.  Lungs: Decreased but generally clear.  Cardiac: RRR, no gallop.  Extremities: Chronic appearing edema.  Lab Results:  Basic Metabolic Panel:  Recent Labs Lab 02/15/13 1620 02/16/13 0520 02/17/13 0410  NA 142 145 137  K 4.1 4.4 3.7  CL 105 105 98  CO2 28 33* 30  GLUCOSE 148* 186* 151*  BUN 15 15 14   CREATININE 0.73 0.72 0.65  CALCIUM 9.0 9.6 9.5  MG  --  2.1  --     CBC:  Recent Labs Lab 02/15/13 1620 02/17/13 0410  WBC 9.5 10.8*  HGB 12.9 14.4  HCT 38.6 43.0  MCV 88.9 89.4  PLT 238 248    Cardiac Enzymes:  Recent Labs Lab 02/16/13 0102 02/16/13 0520  TROPONINI <0.30 <0.30    BNP:  Recent Labs  09/21/12 1531 02/15/13 1620  PROBNP 191.0* 325.6*    Echocardiogram 5/31: Study Conclusions  - Left ventricle: The cavity size was normal. Wall thickness was increased in a pattern of moderate to severe LVH. Systolic function was normal. The estimated ejection fraction was in the range of 55% to 60%. - Left atrium: The atrium was moderately  to severely dilated.   Medications:   Scheduled Medications: . albuterol  2.5 mg Nebulization TID   And  . ipratropium  0.5 mg Nebulization TID  . aspirin EC  325 mg Oral Daily  . furosemide  40 mg Intravenous BID  . heparin  5,000 Units Subcutaneous Q8H  . insulin glargine  50 Units Subcutaneous BID  . losartan  25 mg Oral Daily  . metoprolol tartrate  12.5 mg Oral BID  . sodium chloride  3 mL Intravenous Q12H    PRN Medications: sodium chloride, acetaminophen, albuterol, benzonatate, dextromethorphan, ondansetron (ZOFRAN) IV, sodium chloride   Assessment:   1. Presentation with cough and chest congestion, shortness of breath, baseline increasing weight with leg edema. She states that she was managed as an outpatient for possible pneumonia within the last month, just recently completed antibiotics. She has had no fevers or chills, chest x-ray shows no infiltrates. Current plan is management for probable acute on chronic diastolic heart failure. She indicates some improvement in symptoms but not resolution. Has had good diuresis so far.  2. 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 LVEF 55-60% with moderate LVH as of January 2014.  3. Bilateral carotid  artery disease.  4. Hypertension.  5. Hyperlipidemia, on statin therapy.   Plan/Discussion:    Continue IV Lasix another 24 hours. She was changed from lisinopril to Cozaar in case any component of her cough is related. Will switch from Lopressor to Toprol-XL today. Possibly home tomorrow on oral diuretic with office followup.  Jonelle Sidle, M.D., F.A.C.C.

## 2013-02-17 NOTE — Progress Notes (Signed)
Pt still having barking cough, PRN cough meds given as ordered, MD notified about cough not improving, will continue to monitor

## 2013-02-18 LAB — BASIC METABOLIC PANEL
CO2: 32 mEq/L (ref 19–32)
Calcium: 9.8 mg/dL (ref 8.4–10.5)
Creatinine, Ser: 0.74 mg/dL (ref 0.50–1.10)
GFR calc Af Amer: >90 mL/min (ref >90)
GFR calc non Af Amer: >90 mL/min (ref >90)

## 2013-02-18 LAB — CBC
HCT: 43 % (ref 36.0–46.0)
MCHC: 32.6 g/dL (ref 30.0–36.0)
Platelets: 230 10*3/uL (ref 150–400)
RDW: 14.5 % (ref 11.5–15.5)

## 2013-02-18 LAB — MAGNESIUM: Magnesium: 2.1 mg/dL (ref 1.5–2.5)

## 2013-02-18 MED ORDER — INSULIN ASPART 100 UNIT/ML ~~LOC~~ SOLN
0.0000 [IU] | Freq: Three times a day (TID) | SUBCUTANEOUS | Status: DC
  Administered 2013-02-18: 3 [IU] via SUBCUTANEOUS
  Administered 2013-02-19: 2 [IU] via SUBCUTANEOUS
  Administered 2013-02-19: 3 [IU] via SUBCUTANEOUS

## 2013-02-18 MED ORDER — INSULIN ASPART 100 UNIT/ML ~~LOC~~ SOLN
15.0000 [IU] | Freq: Every day | SUBCUTANEOUS | Status: DC
  Administered 2013-02-19: 15 [IU] via SUBCUTANEOUS

## 2013-02-18 MED ORDER — INSULIN ASPART PROT & ASPART (70-30 MIX) 100 UNIT/ML ~~LOC~~ SUSP: 60.0000 [IU] | Freq: Two times a day (BID) | SUBCUTANEOUS | Status: DC

## 2013-02-18 MED ORDER — FUROSEMIDE 10 MG/ML IJ SOLN
80.0000 mg | Freq: Two times a day (BID) | INTRAMUSCULAR | Status: DC
  Administered 2013-02-18 – 2013-02-19 (×3): 80 mg via INTRAVENOUS
  Filled 2013-02-18: qty 8
  Filled 2013-02-18: qty 4
  Filled 2013-02-18 (×2): qty 8

## 2013-02-18 MED ORDER — INSULIN ASPART 100 UNIT/ML ~~LOC~~ SOLN
20.0000 [IU] | Freq: Two times a day (BID) | SUBCUTANEOUS | Status: DC
  Administered 2013-02-18: 20 [IU] via SUBCUTANEOUS

## 2013-02-18 MED ORDER — METOPROLOL SUCCINATE ER 50 MG PO TB24
50.0000 mg | ORAL_TABLET | Freq: Every day | ORAL | Status: DC
  Administered 2013-02-18 – 2013-02-19 (×2): 50 mg via ORAL
  Filled 2013-02-18 (×2): qty 1

## 2013-02-18 NOTE — Progress Notes (Signed)
Inpatient Diabetes Program Recommendations  AACE/ADA: New Consensus Statement on Inpatient Glycemic Control (2013)  Target Ranges:  Prepandial:   less than 140 mg/dL      Peak postprandial:   less than 180 mg/dL (1-2 hours)      Critically ill patients:  140 - 180 mg/dL     Results for Felicia Rivas, Felicia Rivas (MRN [id number]) as of 02/18/2013 14:23  Ref. Range 02/17/2013 05:37 02/17/2013 11:10 02/17/2013 16:05 02/17/2013 21:33  Glucose-Capillary Latest Range: 70-99 mg/dL 956 (H) 213 (H) 086 (H) 264 (H)    Results for Felicia Rivas, Felicia Rivas (MRN [id number]) as of 02/18/2013 14:23  Ref. Range 02/18/2013 05:52 02/18/2013 11:36  Glucose-Capillary Latest Range: 70-99 mg/dL 528 (H) 413 (H)     Spoke with patient via phone.  Per patient, she saw Dr. Everardo All in February this year and he made adjustments to her home insulin regimen.  Per patient her home insulin regimen is as follows:  Lantus 50 units bid Humalog 60 units with breakfast/ 40 units with lunch/ 60 units with supper  Called Ward Givens, NP to discuss patient's CBGs and insulin orders. Received telephone orders to d/c 70/30 insulin and to start 1/3 patient's home dose Humalog (as Novolog): 20 units with breakfast and supper and 15 units with lunch.   Orders placed in EPIC and RN Lupita Leash notified of new insulin orders.  Will follow. Ambrose Finland RN, MSN, CDE Diabetes Coordinator Inpatient Diabetes Program [phone]

## 2013-02-18 NOTE — Progress Notes (Signed)
02/18/13 1615 In to complete Heart Failure home health screen.  Pt. does not want home health at this time.  Pt. has oxygen from [facility].  Pt. may dc home tomorrow.  Tera Mater, RN, BSN NCM [phone]

## 2013-02-18 NOTE — Plan of Care (Signed)
Problem: Phase II Progression Outcomes Goal: Discharge plan established Outcome: Completed/Met Date Met:  02/18/13 Discharge to home.

## 2013-02-18 NOTE — Progress Notes (Signed)
Utilization Review Completed.   Eveny Anastas, RN, BSN Nurse Case Manager  [phone]  

## 2013-02-18 NOTE — Progress Notes (Signed)
Primary cardiologist: Dr. Olga Millers  Subjective:   Has had some coughing this morning. States it is "frothy." No chest pain.    Objective:   Temp:  [98 F (36.7 C)-98.3 F (36.8 C)] 98 F (36.7 C) (06/02 0514) Pulse Rate:  [73-79] 73 (06/02 0514) Resp:  [22] 22 (06/02 0514) BP: (144-155)/(54-80) 146/80 mmHg (06/02 0514) SpO2:  [94 %-98 %] 94 % (06/02 0514) Weight:  [313 lb 0.9 oz (142 kg)] 313 lb 0.9 oz (142 kg) (06/02 0514) Last BM Date: 02/15/13  Filed Weights   02/16/13 0448 02/17/13 0430 02/18/13 0514  Weight: 320 lb 4.8 oz (145.287 kg) 315 lb 7.7 oz (143.1 kg) 313 lb 0.9 oz (142 kg)    Intake/Output Summary (Last 24 hours) at 02/18/13 0740 Last data filed at 02/18/13 0300  Gross per 24 hour  Intake    680 ml  Output   2000 ml  Net  -1320 ml   Telemetry: Sinus rhythm  Exam:  General: Morbidly obese, comfortable at rest.  Neck: supple  Lungs: CTA  Cardiac: RRR  Abd: soft, not tender  Extremities: No edema.  Lab Results:  Basic Metabolic Panel:  Recent Labs Lab 02/16/13 0520 02/17/13 0410 02/18/13 0513  NA 145 137 136  K 4.4 3.7 4.1  CL 105 98 98  CO2 33* 30 32  GLUCOSE 186* 151* 175*  BUN 15 14 16   CREATININE 0.72 0.65 0.74  CALCIUM 9.6 9.5 9.8  MG 2.1  --  2.1    CBC:  Recent Labs Lab 02/15/13 1620 02/17/13 0410 02/18/13 0513  WBC 9.5 10.8* 10.3  HGB 12.9 14.4 14.0  HCT 38.6 43.0 43.0  MCV 88.9 89.4 89.4  PLT 238 248 230    Cardiac Enzymes:  Recent Labs Lab 02/16/13 0102 02/16/13 0520  TROPONINI <0.30 <0.30    BNP:  Recent Labs  09/21/12 1531 02/15/13 1620  PROBNP 191.0* 325.6*    Echocardiogram 5/31: Study Conclusions  - Left ventricle: The cavity size was normal. Wall thickness was increased in a pattern of moderate to severe LVH. Systolic function was normal. The estimated ejection fraction was in the range of 55% to 60%. - Left atrium: The atrium was moderately to  severely dilated.   Medications:   Scheduled Medications: . albuterol  2.5 mg Nebulization TID   And  . ipratropium  0.5 mg Nebulization TID  . aspirin EC  325 mg Oral Daily  . furosemide  40 mg Intravenous BID  . heparin  5,000 Units Subcutaneous Q8H  . insulin glargine  50 Units Subcutaneous BID  . losartan  25 mg Oral Daily  . metoprolol succinate  25 mg Oral Daily  . sodium chloride  3 mL Intravenous Q12H    PRN Medications: sodium chloride, acetaminophen, albuterol, benzonatate, dextromethorphan-guaiFENesin, ondansetron (ZOFRAN) IV, sodium chloride   Assessment:   1. Presentation with cough and chest congestion, shortness of breath, baseline increasing weight with leg edema. She states that she was managed as an outpatient for possible pneumonia within the last month, just recently completed antibiotics. She has had no fevers or chills, chest x-ray shows no infiltrates. Current plan is management for probable acute on chronic diastolic heart failure. She indicates some improvement in symptoms but not resolution.   2. 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   3. Bilateral carotid artery disease.  4. Hypertension.  5. Hyperlipidemia, on statin therapy.  Plan/Discussion:    Continue IV Lasix and increase to 80 mg IV BID. Repeat BMET in AM. She was changed from lisinopril to Cozaar in case any component of her cough is related. Continue toprol but increase to 50 mg po daily. Possibly home tomorrow on oral diuretic with office followup.  Olga Millers M.D., F.A.C.C.

## 2013-02-19 ENCOUNTER — Encounter (HOSPITAL_COMMUNITY): Payer: Self-pay | Admitting: Physician Assistant

## 2013-02-19 ENCOUNTER — Telehealth: Payer: Self-pay | Admitting: Cardiology

## 2013-02-19 ENCOUNTER — Other Ambulatory Visit: Payer: Self-pay | Admitting: Physician Assistant

## 2013-02-19 DIAGNOSIS — I5033 Acute on chronic diastolic (congestive) heart failure: Secondary | ICD-10-CM

## 2013-02-19 LAB — BASIC METABOLIC PANEL
BUN: 20 mg/dL (ref 6–23)
Calcium: 9.9 mg/dL (ref 8.4–10.5)
Creatinine, Ser: 0.81 mg/dL (ref 0.50–1.10)
GFR calc non Af Amer: 79 mL/min — ABNORMAL LOW (ref >90)
Glucose, Bld: 118 mg/dL — ABNORMAL HIGH (ref 70–99)

## 2013-02-19 LAB — GLUCOSE, CAPILLARY: Glucose-Capillary: 154 mg/dL — ABNORMAL HIGH (ref 70–99)

## 2013-02-19 MED ORDER — BENZONATATE 200 MG PO CAPS: 200.0000 mg | ORAL_CAPSULE | Freq: Three times a day (TID) | ORAL | Status: DC | PRN

## 2013-02-19 MED ORDER — OMEPRAZOLE 20 MG PO CPDR: 20.0000 mg | DELAYED_RELEASE_CAPSULE | Freq: Every day | ORAL | Status: DC

## 2013-02-19 MED ORDER — METOPROLOL SUCCINATE ER 50 MG PO TB24: 50.0000 mg | ORAL_TABLET | Freq: Every day | ORAL | Status: DC

## 2013-02-19 MED ORDER — FUROSEMIDE 40 MG PO TABS: 80.0000 mg | ORAL_TABLET | Freq: Two times a day (BID) | ORAL | Status: DC

## 2013-02-19 MED ORDER — DM-GUAIFENESIN ER 30-600 MG PO TB12: 1.0000 | ORAL_TABLET | Freq: Two times a day (BID) | ORAL | Status: DC | PRN

## 2013-02-19 MED ORDER — LOSARTAN POTASSIUM 25 MG PO TABS: 25.0000 mg | ORAL_TABLET | Freq: Every day | ORAL | Status: DC

## 2013-02-19 MED ORDER — POTASSIUM CHLORIDE CRYS ER 20 MEQ PO TBCR: 20.0000 meq | EXTENDED_RELEASE_TABLET | Freq: Two times a day (BID) | ORAL | Status: DC

## 2013-02-19 NOTE — Telephone Encounter (Signed)
New Problem:    Patient has been scheduled for a 7 day TCM appointment with Tereso Newcomer on 02/26/13 @ 10:30am per Annabelle Harman PA.

## 2013-02-19 NOTE — Discharge Summary (Signed)
See progress notes Felicia Rivas  

## 2013-02-19 NOTE — Plan of Care (Signed)
Problem: Discharge Progression Outcomes Goal: If EF < 40% ACEI/ARB addressed at discharge Outcome: Completed/Met Date Met:  02/19/13 EF 5/31 55 - 60 %

## 2013-02-19 NOTE — Progress Notes (Signed)
Pt discharged per w/c has return to work note for 6/5 , d/c instructions, heart failure, exit care notes, and all personal belongings. Pt aware of importance of daily weights. Has a sliding scale Lasix and knows she has to weigh every am. Aware of the 2 to 3 lb weight gain rule and instructions. Daughter to drive pt home.

## 2013-02-19 NOTE — Telephone Encounter (Signed)
TCM call to patient no answer.LMTC. 

## 2013-02-19 NOTE — Discharge Summary (Signed)
Discharge Summary   Patient ID: Felicia Rivas MRN: [id number], DOB/AGE: 22-Apr-1956 57 y.o. Admit date: 02/15/2013 D/C date:     02/19/2013  Primary Cardiologist: Jens Som  Primary Discharge Diagnoses:  1. Acute on chronic diastolic CHF - adm wt 324, dc wt 311 - echo this admission: EF 55-60%, mod-severe LVH, mod-sev LA dilitation 2. Cough - felt related to diastolic CHF, but lisinopril changed to ARB - PPI added for ?GERD given nocturnal component - if persists as outpatient, may need pulmonology referral 3. Multivessel CAD s/p CABG 08/2011 4. HTN 5. HLD 6. Morbid obesity BMI 50.2  Secondary Discharge Diagnoses:  1. Bilateral carotid artery disease - dopplers 09/2012 showed a 60-79% right and 40-59% left stenosis, f/u dopplers due 6 months 2. Asthma 3. Sleep disturbance - Had sleep study several years ago, wasn't told she has OSA but told she needs O2 at night 4. History of chicken pox 5. Complication of anesthesia 6. PONV (postoperative nausea and vomiting) 7. Stroke 2006 - denies residual  Hospital Course: Ms. Terrones is a 57 y/o F with history of CAD s/p CABG 08/2011, carotid artery disease, morbid obesity, HTN, and HL who presented to [facility] 02/15/13 with complaints of increasing dyspnea. She was seen twice in January 2014 with complaints of dyspnea with addition of lisinopril and increase in Lasix. She declined inpatient admission had some improvement with outpatient therapy. For the past month, she has had dyspnea, PND, orthopnea, LE swelling, wheezing and now production of pinkish sputum. She denied any chest pain. She was recently seen and placed on antibiotics for suspected PNA but failed to improve, thus prompting her to go to the ER. Initial troponin was negative and EKG was relatively unchanged. CXR showed new or increased interstitial prominence which could represent the sequelae of chronic bronchitis/smoking. Mild pulmonary venous congestion was felt less likely,  given absence of pleural fluid. Her symptoms were felt concerning for CHF thus she was admitted for treatment of CHF with IV Lasix. Troponins remained negative. WBC was WNL on admission. Lisinopril was changed to Cozaar in the event this was contributing to cough. 2D echo 02/16/13 showed EF 55-60% with moderate-severe LVH, moderate-severe LA dilitation. With diuresis, she went from 324lb to 311lbs on discharge with -8.8L. She has seen improvement in her symptoms, but still has a component of cough. If this persists as an outpatient, she may require referral back to PCP or to pulmonology. Dr. Jens Som has seen and examined her today and feels she is stable for discharge. He would like her to have a BMET in 1 week. Per office protocol, she will have a TOC appt for CHF within 7-10 days. Per discussion with Dr. Jens Som, will discharge on home dose of Lasix 80mg  BID. We will also add a PPI in case there is a component of GERD contributing to nocturnal cough. Of note, while on varying doses of IV Lasix in the hospital, she was not on potassium supplementation and level remained WNL, although insulin dose was also djusted. Thus home KCl was adjusted downward on discharge (admitting K level was 4.1).  Discharge Vitals: Blood pressure 120/68, pulse 88, temperature 97.5 F (36.4 C), temperature source Oral, resp. rate 18, height 5\' 6"  (1.676 m), weight 311 lb 3.2 oz (141.159 kg), SpO2 91.00%.  Labs: Lab Results  Component Value Date   WBC 10.3 02/18/2013   HGB 14.0 02/18/2013   HCT 43.0 02/18/2013   MCV 89.4 02/18/2013   PLT 230 02/18/2013  Recent Labs Lab 02/15/13 1620  02/19/13 0538  NA 142  < > 137  K 4.1  < > 3.7  CL 105  < > 98  CO2 28  < > 31  BUN 15  < > 20  CREATININE 0.73  < > 0.81  CALCIUM 9.0  < > 9.9  PROT 6.6  --   --   BILITOT 0.2*  --   --   ALKPHOS 124*  --   --   ALT 19  --   --   AST 23  --   --   GLUCOSE 148*  < > 118*  < > = values in this interval not displayed. No results found  for this basename: CKTOTAL, CKMB, TROPONINI,  in the last 72 hours No results found for this basename: CHOL,  HDL,  LDLCALC,  TRIG   No results found for this basename: DDIMER    Diagnostic Studies/Procedures   Dg Chest 2 View 02/15/2013   *RADIOLOGY REPORT*  Clinical Data: Cough and shortness of breath for 3 weeks. Diagnosed with pneumonia 3 weeks ago.  Ex-smoker.  CHEST - 2 VIEW  Comparison: 10/04/2011  Findings: Prior median sternotomy. Midline trachea.  Mild cardiomegaly. Mediastinal contours otherwise within normal limits. No pleural effusion or pneumothorax. Mild interstitial prominence which is new or increased. No lobar consolidation.  IMPRESSION: Cardiomegaly with new or increased interstitial prominence.  This could represent the sequelae of chronic bronchitis/smoking.  Mild pulmonary venous congestion is felt less likely, given absence of pleural fluid.  No evidence of pneumonia.   Original Report Authenticated By: Jeronimo Greaves, M.D.   2D Echo Study Conclusions 02/16/13 - Left ventricle: The cavity size was normal. Wall thickness was increased in a pattern of moderate to severe LVH. Systolic function was normal. The estimated ejection fraction was in the range of 55% to 60%. - Left atrium: The atrium was moderately to severely Dilated.   Discharge Medications     Medication List    STOP taking these medications       lisinopril 5 MG tablet  Commonly known as:  PRINIVIL,ZESTRIL     metoprolol tartrate 25 MG tablet  Commonly known as:  LOPRESSOR      TAKE these medications       albuterol (2.5 MG/3ML) 0.083% nebulizer solution  Commonly known as:  PROVENTIL  Take 2.5 mg by nebulization 2 (two) times daily as needed. For shortness of breath and wheezing     albuterol 108 (90 BASE) MCG/ACT inhaler  Commonly known as:  PROVENTIL HFA;VENTOLIN HFA  Inhale 1 puff into the lungs every 4 (four) hours as needed for shortness of breath.     aspirin 325 MG EC tablet  Take 1 tablet  (325 mg total) by mouth daily.     benzonatate 200 MG capsule  Commonly known as:  TESSALON  Take 1 capsule (200 mg total) by mouth 3 (three) times daily as needed for cough.     dextromethorphan-guaiFENesin 30-600 MG per 12 hr tablet  Commonly known as:  MUCINEX DM  Take 1 tablet by mouth 2 (two) times daily as needed (cough).     furosemide 40 MG tablet  Commonly known as:  LASIX  Take 2 tablets (80 mg total) by mouth 2 (two) times daily. May take an extra 1 tablet (40mg  total) daily as needed for weight gain of 2 pounds. If you take this extra Lasix, take 1 additional potassium tablet that day.  ibuprofen 200 MG tablet  Commonly known as:  ADVIL,MOTRIN  Take 800 mg by mouth 2 (two) times daily as needed. For pain     insulin glargine 100 UNIT/ML injection  Commonly known as:  LANTUS  Inject 50 Units into the skin 2 (two) times daily.     insulin lispro 100 UNIT/ML injection  Commonly known as:  HUMALOG  3x a day (just before each meal), 60-40-60 units.     losartan 25 MG tablet  Commonly known as:  COZAAR  Take 1 tablet (25 mg total) by mouth daily.     metoprolol succinate 50 MG 24 hr tablet  Commonly known as:  TOPROL-XL  Take 1 tablet (50 mg total) by mouth daily.     omeprazole 20 MG capsule  Commonly known as:  PRILOSEC  Take 1 capsule (20 mg total) by mouth daily.     potassium chloride SA 20 MEQ tablet  Commonly known as:  K-DUR,KLOR-CON  Take 1 tablet (20 mEq total) by mouth 2 (two) times daily.        Disposition   The patient will be discharged in stable condition to home. Discharge Orders   Future Appointments Provider Department Dept Phone   02/26/2013 10:30 AM Beatrice Lecher, PA-C [place] [facility] ([place]) [phone]   02/26/2013 10:55 AM [facility] Main [facility] [place]) [phone]   Future Orders Complete By Expires     Diet - low sodium heart healthy  As directed     Discharge instructions  As directed       Comments:      Some patients develop cough because of silent acid reflux, so we have started a new medicine for this. If your cough continues, you may need further evaluation by your primary doctor or a lung doctor.  For patients with congestive heart failure, we give them these special instructions:  1. Follow a low-salt diet and watch your fluid intake. In general, you should not be taking in more than 2 liters of fluid per day (no more than 8 glasses per day). Some patients are restricted to less than 1.5 liters of fluid per day (no more than 6 glasses per day). This includes sources of water in foods like soup, coffee, tea, milk, etc. 2. Weigh yourself on the same scale at same time of day and keep a log. 3. Call your doctor: (Anytime you feel any of the following symptoms)  - 3-4 pound weight gain in 1-2 days or 2 pounds overnight  - Shortness of breath, with or without a dry hacking cough  - Swelling in the hands, feet or stomach  - If you have to sleep on extra pillows at night in order to breathe   IT IS IMPORTANT TO LET YOUR DOCTOR KNOW EARLY ON IF YOU ARE HAVING SYMPTOMS SO WE CAN HELP YOU!    Increase activity slowly  As directed     Scheduling Instructions:      You may return to work Thurs 02/21/13.      Follow-up Information   Follow up with Romero Belling, MD. (Follow up with your endocrinologist/primary care doctor for management of your diabetes.)    Contact information:   [address] [place] [address] [phone]       Follow up with Tereso Newcomer, PA-C. (Tuesday 02/26/13 at 10:30am with bloodwork same day)    Contact information:   [facility] [address] [place]  [place] [address] [phone]         Duration of Discharge Encounter: Greater than 30 minutes including physician and PA time.  Signed, Ronie Spies PA-C 02/19/2013, 12:23 PM

## 2013-02-19 NOTE — Progress Notes (Signed)
Went over all discharge instructions with pt including followup appts and home meds. Pt has heart failure booklet and exit care notes on Diabetes.

## 2013-02-19 NOTE — Plan of Care (Signed)
Problem: Problem: Diabetes Management Progression Goal: INCREASE DIABETES KNOWLEDGE Outcome: Completed/Met Date Met:  02/19/13 Pt given and discussed Sick Day management and 1800 cal ada diet exit care notes using teachback method

## 2013-02-19 NOTE — Progress Notes (Signed)
Primary cardiologist: Dr. Olga Millers  Subjective:   Dyspnea and cough improving; No chest pain.    Objective:   Temp:  [97.5 F (36.4 C)-98.5 F (36.9 C)] 97.5 F (36.4 C) (06/03 0537) Pulse Rate:  [73-88] 74 (06/03 0537) Resp:  [18-20] 18 (06/03 0537) BP: (129-137)/(55-74) 133/74 mmHg (06/03 0537) SpO2:  [92 %-100 %] 97 % (06/03 0537) Weight:  [311 lb 3.2 oz (141.159 kg)] 311 lb 3.2 oz (141.159 kg) (06/03 0537) Last BM Date: 02/15/13  Filed Weights   02/18/13 0514 02/19/13 0500 02/19/13 0537  Weight: 313 lb 0.9 oz (142 kg) 311 lb 3.2 oz (141.159 kg) 311 lb 3.2 oz (141.159 kg)    Intake/Output Summary (Last 24 hours) at 02/19/13 4098 Last data filed at 02/19/13 0538  Gross per 24 hour  Intake    840 ml  Output   3200 ml  Net  -2360 ml   Telemetry: Sinus rhythm  Exam:  General: Morbidly obese, comfortable at rest.  Neck: supple  Lungs: CTA  Cardiac: RRR  Abd: soft, not tender  Extremities: No edema.  Lab Results:  Basic Metabolic Panel:  Recent Labs Lab 02/16/13 0520 02/17/13 0410 02/18/13 0513 02/19/13 0538  NA 145 137 136 137  K 4.4 3.7 4.1 3.7  CL 105 98 98 98  CO2 33* 30 32 31  GLUCOSE 186* 151* 175* 118*  BUN 15 14 16 20   CREATININE 0.72 0.65 0.74 0.81  CALCIUM 9.6 9.5 9.8 9.9  MG 2.1  --  2.1  --     CBC:  Recent Labs Lab 02/15/13 1620 02/17/13 0410 02/18/13 0513  WBC 9.5 10.8* 10.3  HGB 12.9 14.4 14.0  HCT 38.6 43.0 43.0  MCV 88.9 89.4 89.4  PLT 238 248 230    Cardiac Enzymes:  Recent Labs Lab 02/16/13 0102 02/16/13 0520  TROPONINI <0.30 <0.30    BNP:  Recent Labs  09/21/12 1531 02/15/13 1620  PROBNP 191.0* 325.6*    Echocardiogram 5/31: Study Conclusions  - Left ventricle: The cavity size was normal. Wall thickness was increased in a pattern of moderate to severe LVH. Systolic function was normal. The estimated ejection fraction was in the range of 55% to 60%. - Left atrium: The atrium was  moderately to severely dilated.   Medications:   Scheduled Medications: . albuterol  2.5 mg Nebulization TID   And  . ipratropium  0.5 mg Nebulization TID  . aspirin EC  325 mg Oral Daily  . furosemide  80 mg Intravenous BID  . heparin  5,000 Units Subcutaneous Q8H  . insulin aspart  0-15 Units Subcutaneous TID WC  . insulin aspart  15 Units Subcutaneous Q1200  . insulin aspart  20 Units Subcutaneous BID WC  . insulin glargine  50 Units Subcutaneous BID  . losartan  25 mg Oral Daily  . metoprolol succinate  50 mg Oral Daily  . sodium chloride  3 mL Intravenous Q12H    PRN Medications: sodium chloride, acetaminophen, albuterol, benzonatate, dextromethorphan-guaiFENesin, ondansetron (ZOFRAN) IV, sodium chloride   Assessment:   1. Presentation with cough and chest congestion, shortness of breath, baseline increasing weight with leg edema. She states that she was managed as an outpatient for possible pneumonia within the last month, just recently completed antibiotics. She has had no fevers or chills, chest x-ray shows no infiltrates. Current plan is management for probable acute on chronic diastolic heart failure. She indicates some improvement in symptoms.   2. Multivessel CAD status  post CABG in December 2012, LIMA to LAD, SVG to OM 2, SVG to PDA.   3. Bilateral carotid artery disease.  4. Hypertension.  5. Hyperlipidemia, on statin therapy.   Plan/Discussion:    Plan to DC on lasix 40 BID with additional 40 mg daily PRN weight gain of 2lbs. Repeat BMET in one week. She was changed from lisinopril to Cozaar in case any component of her cough is related. Continue toprol at 50 mg po daily. FU with me in 2-4 weeks > 30 min PA and physician time D2  Olga Millers M.D., F.A.C.C.

## 2013-02-20 NOTE — Telephone Encounter (Signed)
LMTCB home and mobile

## 2013-02-21 NOTE — Telephone Encounter (Signed)
LMTCB home and mobile

## 2013-02-23 DIAGNOSIS — J4520 Mild intermittent asthma, uncomplicated: Secondary | ICD-10-CM | POA: Insufficient documentation

## 2013-02-26 ENCOUNTER — Ambulatory Visit (INDEPENDENT_AMBULATORY_CARE_PROVIDER_SITE_OTHER): Payer: BC Managed Care – PPO | Admitting: Physician Assistant

## 2013-02-26 ENCOUNTER — Other Ambulatory Visit: Payer: Self-pay

## 2013-02-26 ENCOUNTER — Telehealth: Payer: Self-pay | Admitting: *Deleted

## 2013-02-26 ENCOUNTER — Encounter: Payer: Self-pay | Admitting: Physician Assistant

## 2013-02-26 VITALS — BP 138/96 | HR 68 | Ht 66.0 in | Wt 320.0 lb

## 2013-02-26 DIAGNOSIS — R05 Cough: Secondary | ICD-10-CM

## 2013-02-26 DIAGNOSIS — I6529 Occlusion and stenosis of unspecified carotid artery: Secondary | ICD-10-CM

## 2013-02-26 DIAGNOSIS — I10 Essential (primary) hypertension: Secondary | ICD-10-CM

## 2013-02-26 DIAGNOSIS — I2510 Atherosclerotic heart disease of native coronary artery without angina pectoris: Secondary | ICD-10-CM

## 2013-02-26 DIAGNOSIS — R059 Cough, unspecified: Secondary | ICD-10-CM

## 2013-02-26 DIAGNOSIS — I5032 Chronic diastolic (congestive) heart failure: Secondary | ICD-10-CM

## 2013-02-26 LAB — BASIC METABOLIC PANEL
BUN: 20 mg/dL (ref 6–23)
GFR: 77.19 mL/min (ref >60.00)
Potassium: 3.9 mEq/L (ref 3.5–5.1)
Sodium: 142 mEq/L (ref 135–145)

## 2013-02-26 NOTE — Telephone Encounter (Signed)
Message copied by Tarri Fuller on Tue Feb 26, 2013  4:54 PM ------      Message from: [place], [place] T      Created: Tue Feb 26, 2013  4:53 PM       K+ and creatinine ok      Continue with current treatment plan.      Tereso Newcomer, PA-C        02/26/2013 4:53 PM ------

## 2013-02-26 NOTE — Patient Instructions (Addendum)
INCREASE LASIX TO 120 MG TWICE DAILY FOR TODAY,  WED 6/11, AND THURS 6/12 TAKE LASIX 120 MG IN THE MORNING AND 80 MG IN THE EVENING   Friday 03/01/13 YOU WILL RESUME 80 MG TWICE DAILY   INCREASE POTASSIUM TO 40 IN THE MORNING AND 20 IN THE PM FOR TODAY, WED, AND THURS. ON Friday 03/01/13 YOU WILL RESUME POTASSIUM 20 MEQ TWICE DAILY  LAB TODAY; BMET  YOU HAVE A FOLLOW UP APPT WITH SCOTT WEAVER, [facility] 03/07/13 @ 10:50   PLEASE SCHEDULE TO HAVE CAROTID DOPPLERS DONE  WEIGH DAILY AND CALL IF WEIGHT  IS INCREASED BY 2 LB X 1 DAY [address] [place]

## 2013-02-26 NOTE — Progress Notes (Signed)
[address], [address] [place], [place]  [address] Phone: [phone] Fax:  [phone]  Date:  02/26/2013   ID:  Felicia Rivas, DOB 09-Jan-1956, MRN [id number]  PCP:  No PCP Per Patient  Cardiologist:  Dr. Olga Millers     History of Present Illness: Felicia Rivas is a 57 y.o. female returns for follow up after a recent admission to the hospital 5/30-6/3 for acute on chronic diastolic CHF. She has a hx of CAD, diastolic CHF, HTN, HL, carotid stenosis. She is status post CABG in 08/2011. Echo 08/2011: EF 55-60%. Carotid Dopplers 09/2012: RICA 60-79%, LICA 40-59% (followup 02/2013).  Last seen 09/2012.  Echo 1/14: EF 55-65%.  She presented to the hospital with one month of dyspnea, PND, orthopnea and LE edema as well as cough with pinkish sputum. She had recently been placed on antibiotics for suspected pneumonia. Cardiac markers remained negative. Her ACE inhibitor was changed to ARB due to recent cough.  Echo 02/16/13: Moderate to severe LVH, EF 55-60%, moderate to severe LAE. She diuresed well and was -8.8 L and down 13 pounds at discharge. Of note, PPI was added due to suspicion for GERD contributing to cough. Consideration could be given to referring her to pulmonology if her cough persists.  Since discharge, the patient notes that her weight has steadily increased. She took an extra dose of Lasix 40 mg yesterday. Her cough is improved. Her dyspnea is somewhat improved. She denies chest pain or syncope. She sleeps on 2 pillows chronically. She notes occasional PND. Her LE edema slightly increased. Weights at home are up 2-3 pounds.  Labs (6/14):  K 3.7, creatinine 0.81, ALT 19, BNP 325.6, Hgb 14, TSH 2.566  Wt Readings from Last 3 Encounters:  02/26/13 320 lb (145.151 kg)  02/19/13 311 lb 3.2 oz (141.159 kg)  10/25/12 306 lb (138.801 kg)     Past Medical History  Diagnosis Date  . Asthma   . Diabetes mellitus   . Sleep disturbance     Had sleep study several years ago,  wasn't told she has OSA but told she needs O2 at night  . CAD (coronary artery disease)     NSTEMI 12/12:   LHC 09/02/11: LAD 70-80%, OM2 80%, small postero-lateral segment of the AV circumflex 60-70%, proximal to mid RCA 40%, acute margin 30%, 80% at the takeoff of the PDA.  . 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 Laneta Simmers;   LIMA-LAD, SVG-OM 2, SVG-PDA.  [name] Chronic diastolic heart failure     a. Echo 08/2011: mod LVH, nl EF. b. Echo 01/2013: EF 55-60%, mod-sev LVH, mod-sev LA dilitation.  . Carotid stenosis     a. dopplers 09/2012 showed a 60-79% right and 40-59% left stenosis, f/u dopplers due 6 months  . HLD (hyperlipidemia)   . History of chicken pox   . Complication of anesthesia   . PONV (postoperative nausea and vomiting)   . Stroke 2006    denies residual  . Cough   . Morbid obesity     Current Outpatient Prescriptions  Medication Sig Dispense Refill  . aspirin EC 325 MG EC tablet Take 1 tablet (325 mg total) by mouth daily.  30 tablet    . benzonatate (TESSALON) 200 MG capsule Take 1 capsule (200 mg total) by mouth 3 (three) times daily as needed for cough.  30 capsule  0  . dextromethorphan-guaiFENesin (MUCINEX DM) 30-600 MG per 12 hr tablet Take 1 tablet by mouth 2 (  two) times daily as needed (cough).  20 tablet  0  . furosemide (LASIX) 40 MG tablet Take 2 tablets (80 mg total) by mouth 2 (two) times daily. May take an extra 1 tablet (40mg  total) daily as needed for weight gain of 2 pounds. If you take this extra Lasix, take 1 additional potassium tablet that day.  30 tablet    . ibuprofen (ADVIL,MOTRIN) 200 MG tablet Take 800 mg by mouth 2 (two) times daily as needed. For pain      . insulin glargine (LANTUS) 100 UNIT/ML injection Inject 50 Units into the skin 2 (two) times daily.  40 mL  11  . insulin lispro (HUMALOG) 100 UNIT/ML injection 3x a day (just before each meal), 60-40-60 units.  60 mL  11  . losartan (COZAAR) 25 MG tablet Take 1 tablet (25 mg total) by mouth daily.  30 tablet   6  . metoprolol succinate (TOPROL-XL) 50 MG 24 hr tablet Take 1 tablet (50 mg total) by mouth daily.  30 tablet  6  . omeprazole (PRILOSEC) 20 MG capsule Take 1 capsule (20 mg total) by mouth daily.  30 capsule  3  . potassium chloride SA (K-DUR,KLOR-CON) 20 MEQ tablet Take 1 tablet (20 mEq total) by mouth 2 (two) times daily.      [name] albuterol (PROVENTIL HFA;VENTOLIN HFA) 108 (90 BASE) MCG/ACT inhaler Inhale 1 puff into the lungs every 4 (four) hours as needed for shortness of breath.      [name] albuterol (PROVENTIL) (2.5 MG/3ML) 0.083% nebulizer solution Take 2.5 mg by nebulization 2 (two) times daily as needed. For shortness of breath and wheezing       No current facility-administered medications for this visit.    Allergies:    Allergies  Allergen Reactions  . Lisinopril     ?Cough - changed to ARB 02/2013.    Social History:  The patient  reports that she has quit smoking. Her smoking use included Cigarettes. She has a 12.5 pack-year smoking history. She quit smokeless tobacco use about 5 months ago. She reports that she does not drink alcohol or use illicit drugs.   ROS:  Please see the history of present illness.      All other systems reviewed and negative.   PHYSICAL EXAM: VS:  BP 138/96  Pulse 68  Ht 5\' 6"  (1.676 m)  Wt 320 lb (145.151 kg)  BMI 51.67 kg/m2 Well nourished, well developed, in no acute distress HEENT: normal Neck: JVD difficult to assess; appears s/w elevated Cardiac:  normal S1, S2; RRR; no murmur Lungs:  clear to auscultation bilaterally, no wheezing, rhonchi or rales Abd: soft, nontender, no hepatomegaly Ext: trace bilat LE edema Skin: warm and dry Neuro:  CNs 2-12 intact, no focal abnormalities noted  EKG:  NSR, HR 68, LAD, T-wave inversions in 1, aVL, no change from prior tracing  ASSESSMENT AND PLAN:  1. Chronic Diastolic CHF: Volume increased since discharge from the hospital. I will increase her Lasix for the next 3 days. I will adjust her potassium  along with this.  I will check a basic metabolic panel today to follow up on her renal function and potassium. Repeat a basic metabolic panel in one week.   2. Cough: Improved on ARB instead of ACE inhibitor as well as PPI. Suspect a combination of CHF, GERD and ACE inhibitor-induced cough. Continue to monitor. If she does not continue to have improvement, consider referral to pulmonology. 3. CAD: Stable. No angina.  Continue aspirin. She is no longer on a statin for unclear reasons. 4. Hypertension: Blood pressure somewhat elevated today. Adjust Lasix as noted. I will recheck her blood pressure followup. 5. Hyperlipidemia:  Will review medications at f/u (?statin). 6. Carotid Stenosis: She is due for follow up carotid Dopplers. These will be arranged. 7. Disposition: Follow up with me in 1 week.  Signed, Tereso Newcomer, PA-C  02/26/2013 10:26 AM

## 2013-02-26 NOTE — Telephone Encounter (Signed)
lmom labs good, no changes to be made 

## 2013-03-03 IMAGING — CR DG CHEST 1V PORT
1 series · 1 of 1 positions shown · non-contrast
Comparison: 08/26/2011

CLINICAL DATA: Short of breath.  Respiratory distress

PORTABLE CHEST - 1 VIEW

[AP]
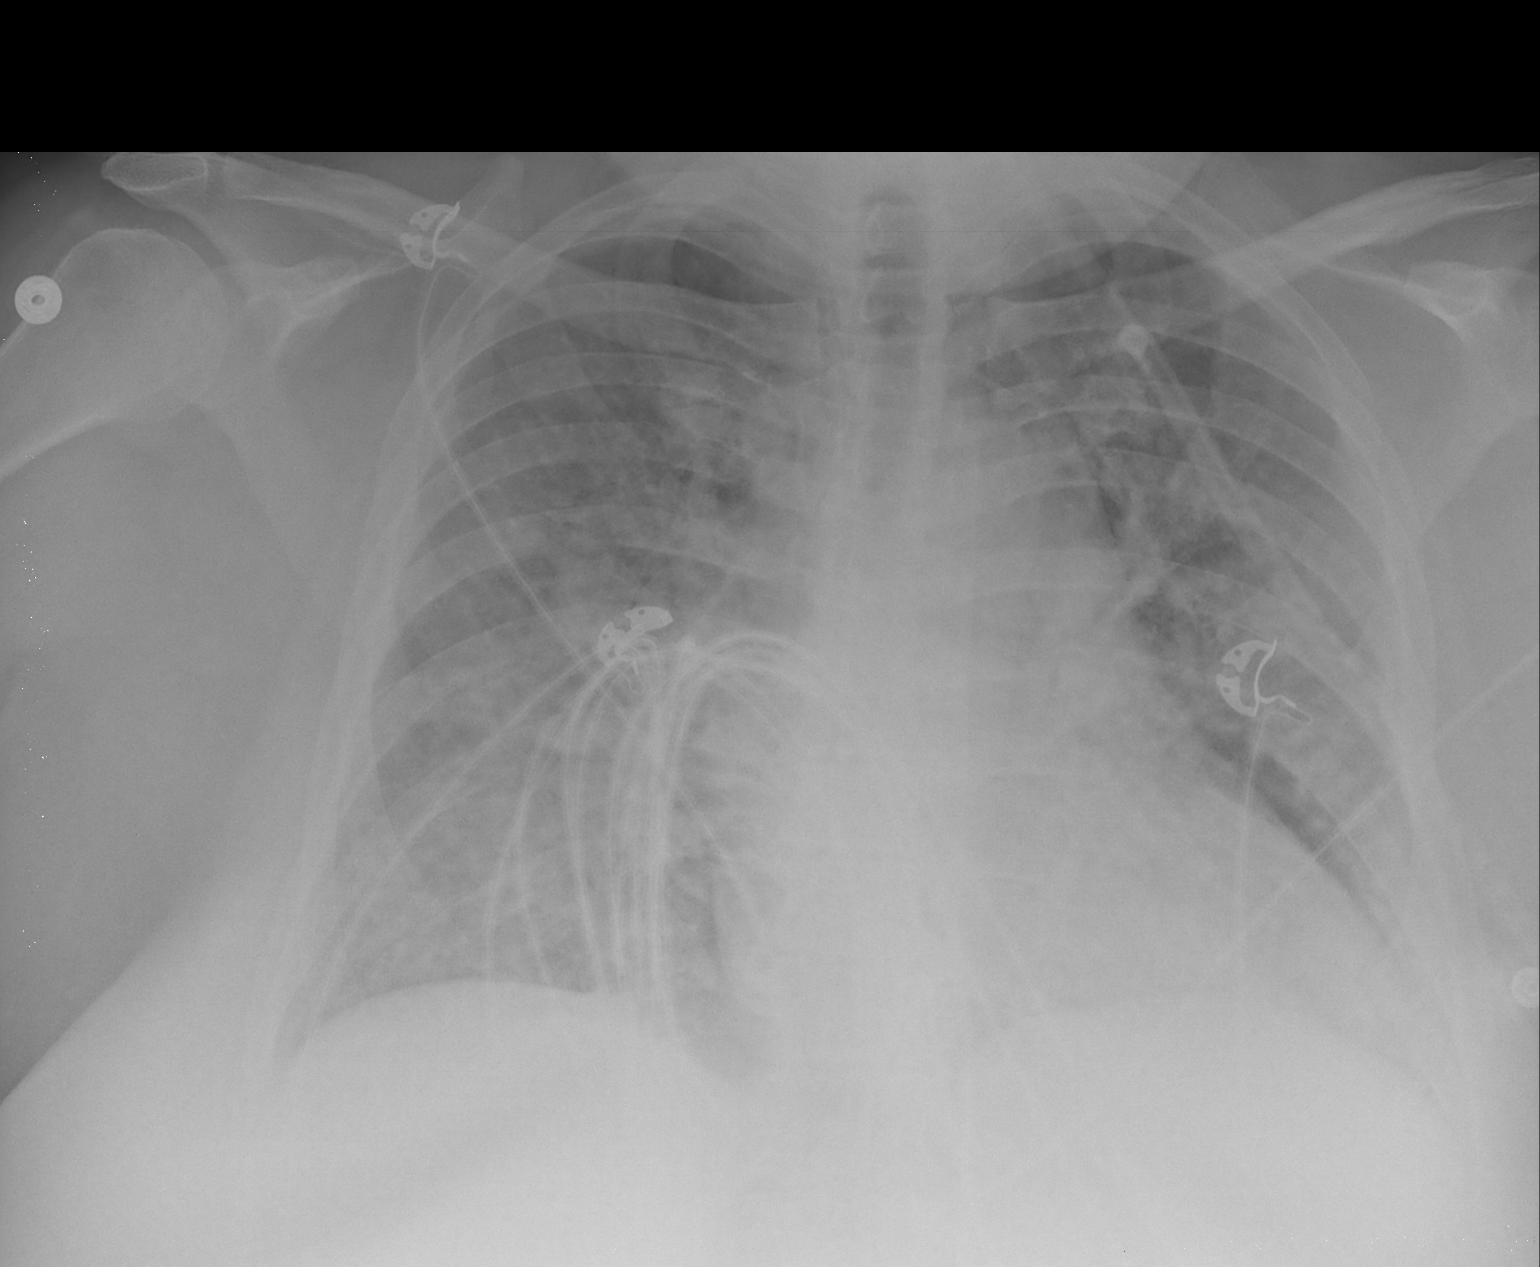

[1 of 1 positions shown; findings below may reference images not displayed]

FINDINGS: Progression of diffuse bilateral airspace disease.  This
may be due to pulmonary edema versus pneumonia.  No significant
pleural effusion.
IMPRESSION: Progression of diffuse bilateral airspace disease, probable
pulmonary edema.

## 2013-03-03 IMAGING — CT CT ANGIO CHEST
2 of 6 series · 18 of 36 positions shown · IV contrast (APPLIED)
Comparison: None

CLINICAL DATA: Shortness of breath.  Asthma.  Pneumonia.

CT ANGIOGRAPHY CHEST WITH CONTRAST
TECHNIQUE: Multidetector CT imaging of the chest was performed
using the standard protocol during bolus administration of
intravenous contrast.  Multiplanar CT image reconstructions
including MIPs were obtained to evaluate the vascular anatomy.
Contrast: 100mL OMNIPAQUE IOHEXOL 300 MG/ML IV SOLN

[Series 7: thins for pacs · axial · 0.74mm/px · z∈[+89,+316]mm · 17 of 253 slices shown]
[im 13/253  lung]
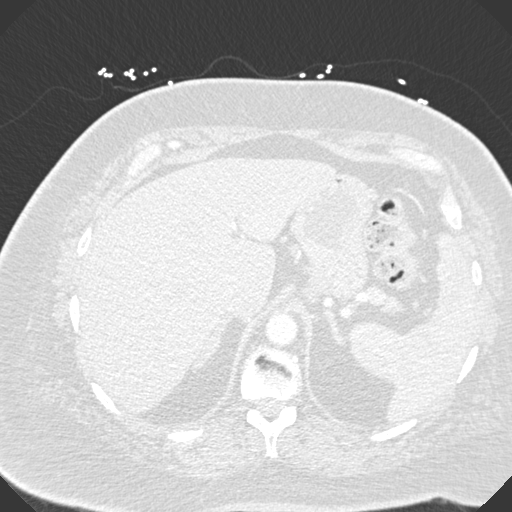
[im 26/253  mediastinal]
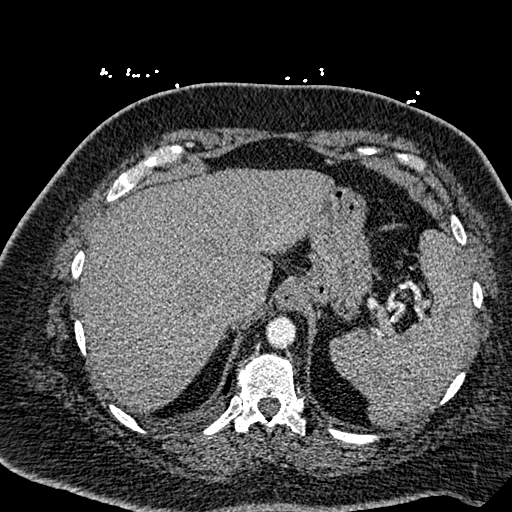
[im 38/253  lung]
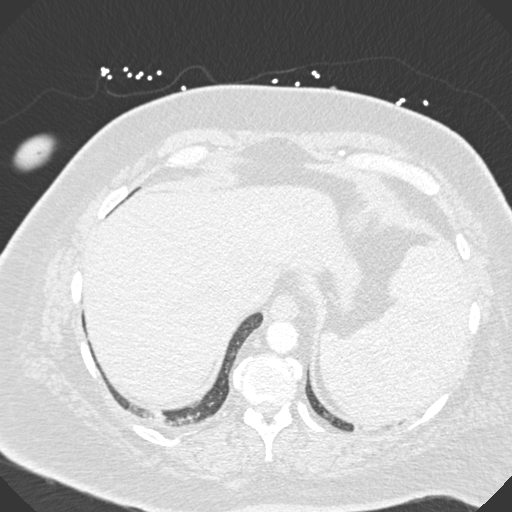
[im 51/253  mediastinal]
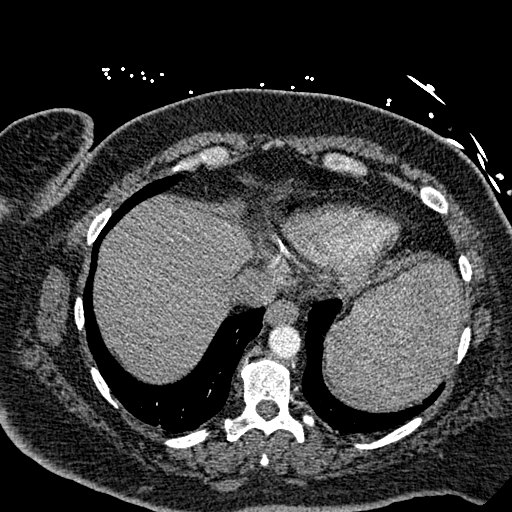
[im 76/253  lung]
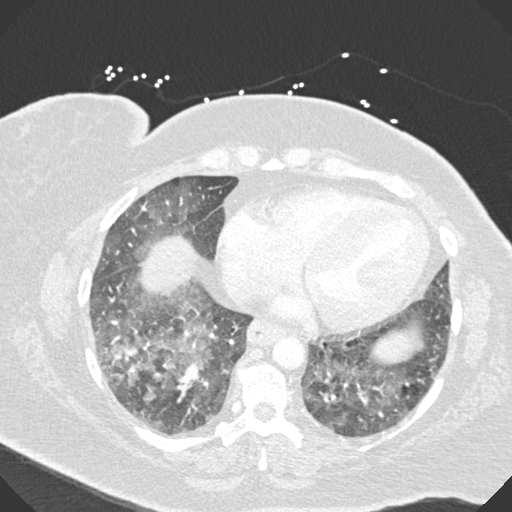
[im 89/253  mediastinal]
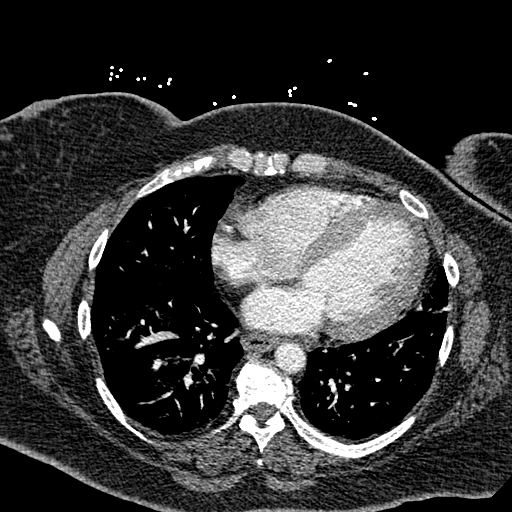
[im 101/253  lung]
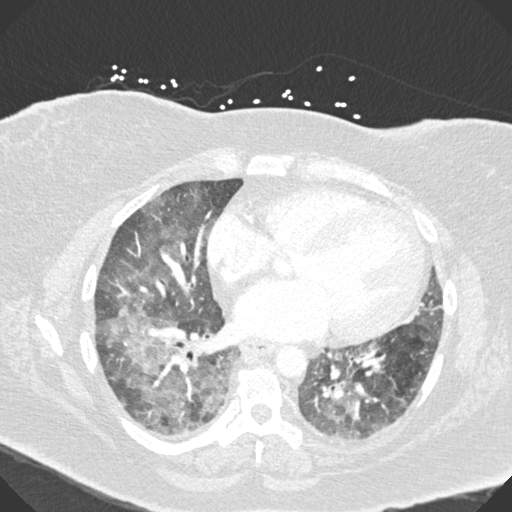
[im 114/253  mediastinal]
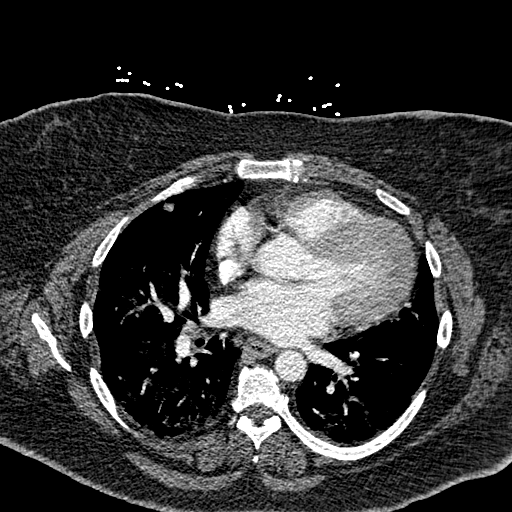
[im 127/253  lung]
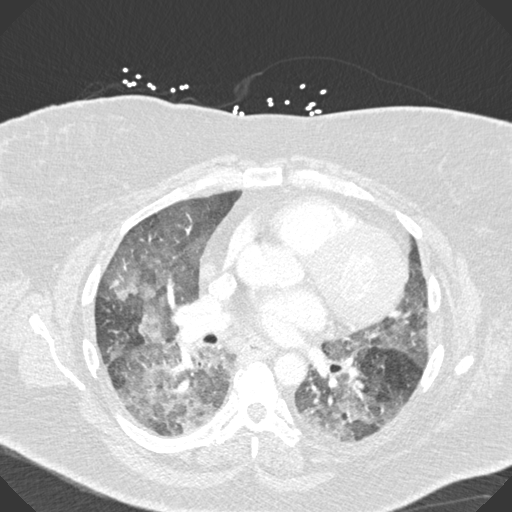
[im 139/253  mediastinal]
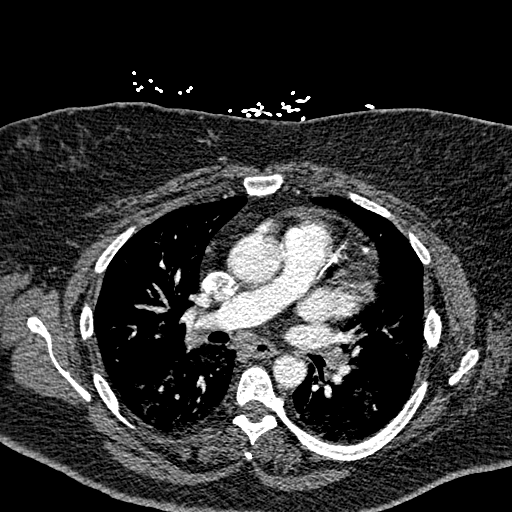
[im 152/253  lung]
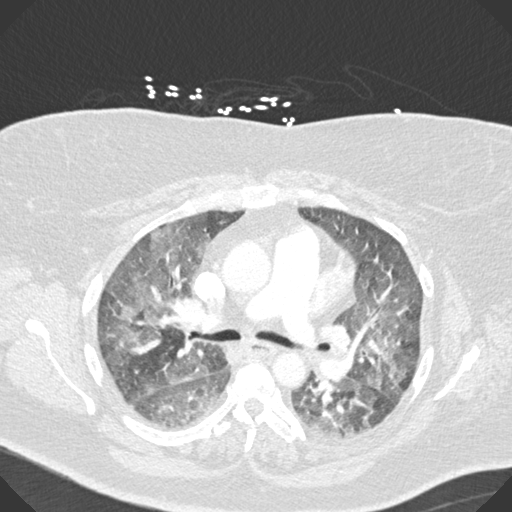
[im 164/253  mediastinal]
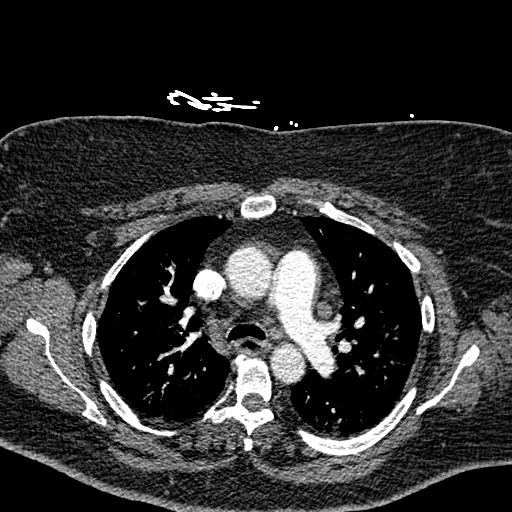
[im 177/253  lung]
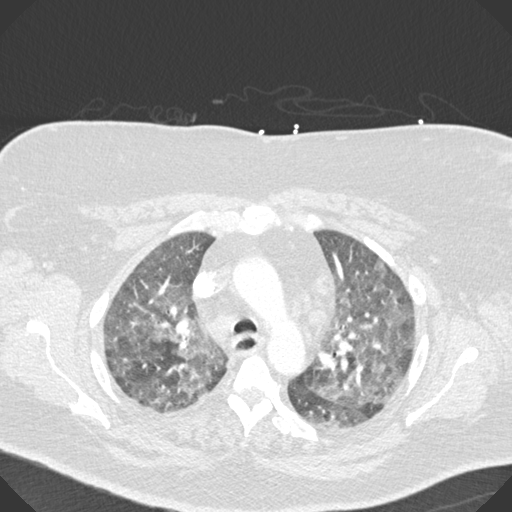
[im 202/253  mediastinal]
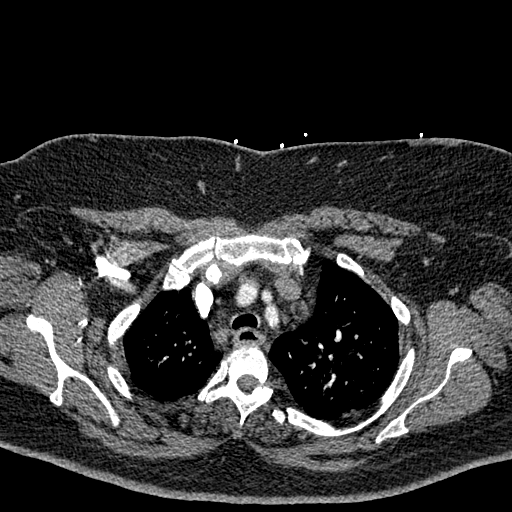
[im 215/253  lung]
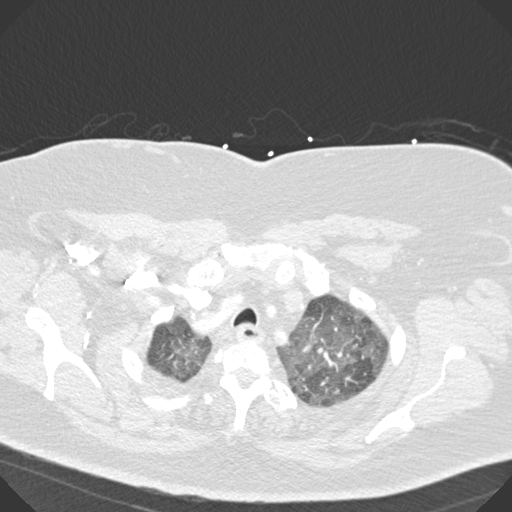
[im 227/253  mediastinal]
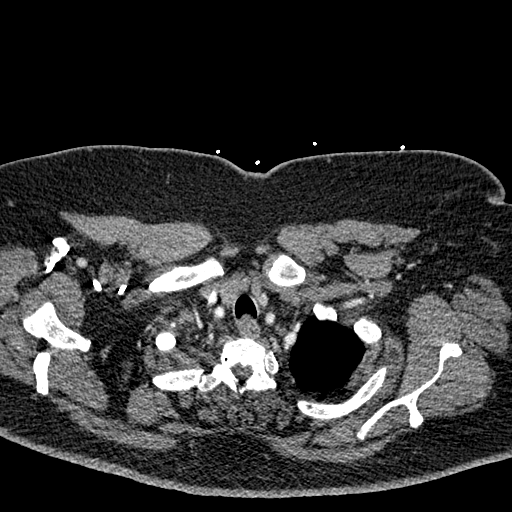
[im 240/253  lung]
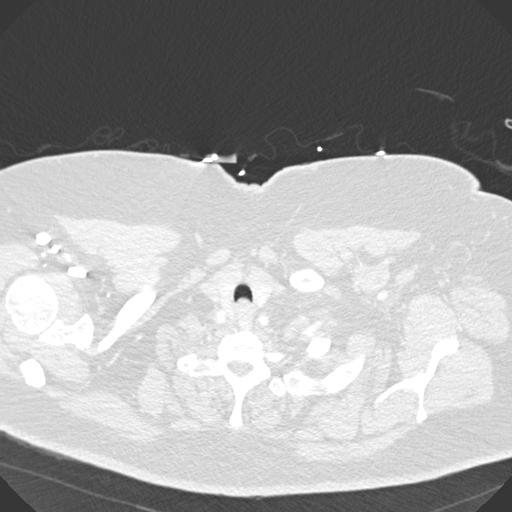

[Series 8: coronal mpr · coronal · 0.49mm/px · 1 of 123 slices shown]
[im 62/123  mediastinal]
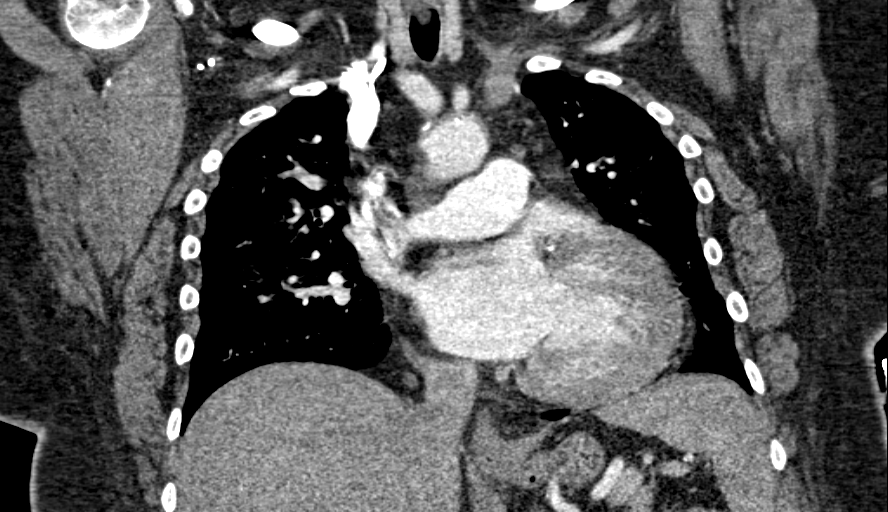

[18 of 36 positions shown; findings below may reference images not displayed]

FINDINGS: Technically adequate study with moderately good
opacification of the central and proximal segmental pulmonary
arteries.   No filling defects are demonstrated in the visualized
vessels.  No evidence of significant pulmonary embolus. The sub
segmental pulmonary arteries are not well visualized and smaller
pulmonary emboli are not excluded. Normal caliber thoracic aorta
with calcification.  Mild diffuse thickening of the esophageal wall
which might represent reflux disease.  Prominent hilar and
mediastinal lymph nodes.  Right paratracheal lymph nodes measure up
to about 17 mm in diameter.  Left aortopulmonic window lymph nodes
measure about 14 mm in diameter.  These are nonspecific and could
represent reactive or pathologic lymph nodes.  Recommend follow-up
in 3-6 months for further evaluation.  Calcification of coronary
arteries.  The lung fields demonstrate diffuse alveolar infiltrates
bilaterally suggesting pulmonary edema or diffuse pneumonia.
Nodular changes are not excluded.  No significant pleural effusion.
No pneumothorax.  Degenerative changes in the thoracic spine
without compression deformity or bone destruction.

Review of the MIP images confirms the above findings.
IMPRESSION: No evidence of significant central pulmonary embolus although the
peripheral vessels are not well demonstrated and smaller peripheral
emboli are not excluded.  Diffuse bilateral airspace disease
throughout the lungs consistent with edema or pneumonia.
Moderately enlarged mediastinal lymph nodes may represent reactive
lymphadenopathy although pathologic lymph nodes are not excluded
and follow-up is recommended.  Mild diffuse esophageal wall
thickening suggesting inflammatory process.

## 2013-03-07 ENCOUNTER — Ambulatory Visit (INDEPENDENT_AMBULATORY_CARE_PROVIDER_SITE_OTHER): Payer: BC Managed Care – PPO | Admitting: Physician Assistant

## 2013-03-07 ENCOUNTER — Encounter: Payer: Self-pay | Admitting: Physician Assistant

## 2013-03-07 VITALS — BP 135/69 | HR 91 | Ht 66.0 in | Wt 318.0 lb

## 2013-03-07 DIAGNOSIS — I2510 Atherosclerotic heart disease of native coronary artery without angina pectoris: Secondary | ICD-10-CM

## 2013-03-07 DIAGNOSIS — I6529 Occlusion and stenosis of unspecified carotid artery: Secondary | ICD-10-CM

## 2013-03-07 DIAGNOSIS — E785 Hyperlipidemia, unspecified: Secondary | ICD-10-CM

## 2013-03-07 DIAGNOSIS — R05 Cough: Secondary | ICD-10-CM

## 2013-03-07 DIAGNOSIS — I5032 Chronic diastolic (congestive) heart failure: Secondary | ICD-10-CM

## 2013-03-07 DIAGNOSIS — I10 Essential (primary) hypertension: Secondary | ICD-10-CM

## 2013-03-07 DIAGNOSIS — I5033 Acute on chronic diastolic (congestive) heart failure: Secondary | ICD-10-CM

## 2013-03-07 LAB — BASIC METABOLIC PANEL
CO2: 31 mEq/L (ref 19–32)
Calcium: 9.8 mg/dL (ref 8.4–10.5)
Chloride: 98 mEq/L (ref 96–112)
Sodium: 138 mEq/L (ref 135–145)

## 2013-03-07 MED ORDER — METOLAZONE 2.5 MG PO TABS: ORAL_TABLET | ORAL | Status: DC

## 2013-03-07 MED ORDER — POTASSIUM CHLORIDE CRYS ER 20 MEQ PO TBCR: 20.0000 meq | EXTENDED_RELEASE_TABLET | Freq: Two times a day (BID) | ORAL | Status: DC

## 2013-03-07 NOTE — Progress Notes (Signed)
[address] [place], [place]  [address] Phone: [phone] Fax:  [phone]  Date:  03/07/2013   ID:  Felicia Rivas, DOB 03/17/1956, MRN [id number]  PCP:  No PCP Per Patient  Cardiologist:  Dr. Olga Millers     History of Present Illness: Felicia Rivas is a 57 y.o. female returns for follow up of diastolic CHF. She has a hx of CAD, diastolic CHF, HTN, HL, carotid stenosis. She is status post CABG in 08/2011. Echo 08/2011: EF 55-60%. Carotid Dopplers 09/2012: RICA 60-79%, LICA 40-59% (followup 02/2013).   Echo 1/14: EF 55-65%.  She was recently admitted to the hospital with a/c diastolic CHF.  Her ACE inhibitor was changed to ARB due to recent cough.  Echo 02/16/13: Moderate to severe LVH, EF 55-60%, moderate to severe LAE. She diuresed well and was -8.8 L and down 13 pounds at discharge. Of note, PPI was added due to suspicion for GERD contributing to cough.  I saw her in followup 02/26/13. Her weight had continued to increase since discharge. I adjusted her Lasix and potassium for 3 days and brought her back in close followup.  Her cough continues to improve. However, she continues to note 2 pillow orthopnea as well as PND. She continues to describe NYHA class III dyspnea. She denies chest discomfort or syncope. Her weights are up and down at home.  Labs (6/14):  K 3.7, creatinine 0.81, ALT 19, BNP 325.6, Hgb 14, TSH 2.566  Wt Readings from Last 3 Encounters:  03/07/13 318 lb (144.244 kg)  02/26/13 320 lb (145.151 kg)  02/19/13 311 lb 3.2 oz (141.159 kg)     Past Medical History  Diagnosis Date  . Asthma   . Diabetes mellitus   . Sleep disturbance     Had sleep study several years ago, wasn't told she has OSA but told she needs O2 at night  . CAD (coronary artery disease)     NSTEMI 12/12:   LHC 09/02/11: LAD 70-80%, OM2 80%, small postero-lateral segment of the AV circumflex 60-70%, proximal to mid RCA 40%, acute margin 30%, 80% at the takeoff of the PDA.  .  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 Laneta Simmers;   LIMA-LAD, SVG-OM 2, SVG-PDA.  [name] Chronic diastolic heart failure     a. Echo 08/2011: mod LVH, nl EF. b. Echo 01/2013: EF 55-60%, mod-sev LVH, mod-sev LA dilitation.  . Carotid stenosis     a. dopplers 09/2012 showed a 60-79% right and 40-59% left stenosis, f/u dopplers due 6 months  . HLD (hyperlipidemia)   . History of chicken pox   . Complication of anesthesia   . PONV (postoperative nausea and vomiting)   . Stroke 2006    denies residual  . Cough   . Morbid obesity     Current Outpatient Prescriptions  Medication Sig Dispense Refill  . albuterol (PROVENTIL HFA;VENTOLIN HFA) 108 (90 BASE) MCG/ACT inhaler Inhale 1 puff into the lungs every 4 (four) hours as needed for shortness of breath.      [name] albuterol (PROVENTIL) (2.5 MG/3ML) 0.083% nebulizer solution Take 2.5 mg by nebulization 2 (two) times daily as needed. For shortness of breath and wheezing      . aspirin EC 325 MG EC tablet Take 1 tablet (325 mg total) by mouth daily.  30 tablet    . benzonatate (TESSALON) 200 MG capsule Take 1 capsule (200 mg total) by mouth 3 (three) times daily as needed for cough.  30 capsule  0  . dextromethorphan-guaiFENesin (MUCINEX DM) 30-600 MG per 12 hr tablet Take 1 tablet by mouth 2 (two) times daily as needed (cough).  20 tablet  0  . furosemide (LASIX) 40 MG tablet Take 2 tablets (80 mg total) by mouth 2 (two) times daily. May take an extra 1 tablet (40mg  total) daily as needed for weight gain of 2 pounds. If you take this extra Lasix, take 1 additional potassium tablet that day.  30 tablet    . ibuprofen (ADVIL,MOTRIN) 200 MG tablet Take 800 mg by mouth 2 (two) times daily as needed. For pain      . insulin glargine (LANTUS) 100 UNIT/ML injection Inject 50 Units into the skin 2 (two) times daily.  40 mL  11  . insulin lispro (HUMALOG) 100 UNIT/ML injection 3x a day (just before each meal), 60-40-60 units.  60 mL  11  . losartan (COZAAR) 25 MG tablet Take 1  tablet (25 mg total) by mouth daily.  30 tablet  6  . metoprolol succinate (TOPROL-XL) 50 MG 24 hr tablet Take 1 tablet (50 mg total) by mouth daily.  30 tablet  6  . omeprazole (PRILOSEC) 20 MG capsule Take 1 capsule (20 mg total) by mouth daily.  30 capsule  3  . potassium chloride SA (K-DUR,KLOR-CON) 20 MEQ tablet Take 1 tablet (20 mEq total) by mouth 2 (two) times daily.       No current facility-administered medications for this visit.    Allergies:    Allergies  Allergen Reactions  . Lisinopril     ?Cough - changed to ARB 02/2013.    Social History:  The patient  reports that she has quit smoking. Her smoking use included Cigarettes. She has a 12.5 pack-year smoking history. She quit smokeless tobacco use about 5 months ago. She reports that she does not drink alcohol or use illicit drugs.   ROS:  Please see the history of present illness.      All other systems reviewed and negative.   PHYSICAL EXAM: VS:  BP 135/69  Pulse 91  Ht 5\' 6"  (1.676 m)  Wt 318 lb (144.244 kg)  BMI 51.35 kg/m2 Well nourished, well developed, in no acute distress HEENT: normal Neck: JVD difficult to assess; appears s/w elevated Cardiac:  normal S1, S2; RRR; no murmur Lungs:  clear to auscultation bilaterally, no wheezing, rhonchi or rales Abd: soft, nontender, no hepatomegaly Ext: trace-1+ bilat LE edema Skin: warm and dry Neuro:  CNs 2-12 intact, no focal abnormalities noted  EKG:  NSR, HR 91, LAD, T-wave inversions in 1, aVL, PVC, no change from prior tracing  ASSESSMENT AND PLAN:  1. Acute on Chronic Diastolic CHF: she remains volume overloaded. I have recommended metolazone 2.5 mg 30 minutes prior to her morning Lasix for the next 3 days. She will then continue metolazone 3 times a week. She will take extra potassium with metolazone. Check a basic metabolic panel today and repeat in one week.  2. Cough: Improved on ARB instead of ACE inhibitor as well as PPI. Suspect a combination of CHF, GERD  and ACE inhibitor-induced cough.  3. CAD: Stable. No angina. Continue aspirin. She is no longer on a statin for unclear reasons. 4. Hypertension: controlled. 5. Hyperlipidemia:  Will review medications at f/u (?statin). 6. Carotid Stenosis: She is due for follow up carotid Dopplers. These will be arranged next month. 7. Disposition: Follow up with me in 2 weeks.  Signed, Tereso Newcomer,  PA-C  03/07/2013 11:47 AM

## 2013-03-07 NOTE — Patient Instructions (Addendum)
START METOLAZONE 2.5 MG On 6/20, 6/21, 6/22 take 2.5 mg 30 minutes before morning lasix dose; starting 6/24 take only on  Tues, thurs, saturdays  FOR YOUR POTASSIUM YOU WILL TAKE EXTRA 40 MEQ TWICE DAILY ON METOLAZONE DAYS ONLY  STAY ON LASIX 80 MG TWICE DAILY  LAB TODAY; BMET  REPEAT BMET IN 1 WEEK  PLEASE FOLLOW UP WITH SCOTT WEAVER, PAC IN 2 WEEKS; IF POSSIBLE SAME DAY DR. Jens Som IS IN THE OFFICE

## 2013-03-08 ENCOUNTER — Telehealth: Payer: Self-pay | Admitting: *Deleted

## 2013-03-08 NOTE — Telephone Encounter (Signed)
Message copied by Tarri Fuller on Fri Mar 08, 2013  8:28 AM ------      Message from: [place], [place] T      Created: Thu Mar 07, 2013  4:53 PM       K+ and creatinine ok      Continue with current treatment plan.      Tereso Newcomer, PA-C        03/07/2013 4:53 PM ------

## 2013-03-08 NOTE — Telephone Encounter (Signed)
lmom labs ok, no changes to be made 

## 2013-03-14 ENCOUNTER — Telehealth: Payer: Self-pay | Admitting: Cardiology

## 2013-03-14 ENCOUNTER — Other Ambulatory Visit: Payer: BC Managed Care – PPO

## 2013-03-14 NOTE — Telephone Encounter (Signed)
Spoke with pt, she reports she is feeling better. Not 100% but better. She is going to come tomorrow for repeat labs for management of her potassium in regards to the leg cramps.

## 2013-03-14 NOTE — Telephone Encounter (Signed)
New problem    Pt having cramps and throwing up-sugar was 400 now down to 150

## 2013-03-15 ENCOUNTER — Telehealth: Payer: Self-pay | Admitting: *Deleted

## 2013-03-15 ENCOUNTER — Other Ambulatory Visit (INDEPENDENT_AMBULATORY_CARE_PROVIDER_SITE_OTHER): Payer: BC Managed Care – PPO

## 2013-03-15 DIAGNOSIS — I5032 Chronic diastolic (congestive) heart failure: Secondary | ICD-10-CM

## 2013-03-15 LAB — BASIC METABOLIC PANEL
BUN: 50 mg/dL — ABNORMAL HIGH (ref 6–23)
Chloride: 90 mEq/L — ABNORMAL LOW (ref 96–112)
Potassium: 4.8 mEq/L (ref 3.5–5.1)

## 2013-03-15 MED ORDER — METOLAZONE 2.5 MG PO TABS: ORAL_TABLET | ORAL | Status: DC

## 2013-03-15 NOTE — Telephone Encounter (Signed)
lmptcb go over lab results, tried cell # as well lmptcb. I then called the pt's daughter Herbert Seta who is aware now of lab results and to hold metolazone, bmet 03/18/13, daughter verbalized understanding to me, advised for pt to f/u PCP about DM, said ok

## 2013-03-15 NOTE — Telephone Encounter (Signed)
Message copied by Tarri Fuller on Fri Mar 15, 2013  4:33 PM ------      Message from: [place], [place] T      Created: Fri Mar 15, 2013 12:44 PM       K+ ok      Creatinine increased.  Hold Metolazone.      Weigh daily and call if weights increasing or increased dyspnea or increased edema      Sugar way too high - f/u with PCP.      Repeat BMET Monday       ------

## 2013-03-18 ENCOUNTER — Other Ambulatory Visit (INDEPENDENT_AMBULATORY_CARE_PROVIDER_SITE_OTHER): Payer: BC Managed Care – PPO

## 2013-03-18 ENCOUNTER — Telehealth: Payer: Self-pay | Admitting: *Deleted

## 2013-03-18 DIAGNOSIS — I5032 Chronic diastolic (congestive) heart failure: Secondary | ICD-10-CM

## 2013-03-18 DIAGNOSIS — I5033 Acute on chronic diastolic (congestive) heart failure: Secondary | ICD-10-CM

## 2013-03-18 LAB — BASIC METABOLIC PANEL
CO2: 23 mEq/L (ref 19–32)
Calcium: 9.7 mg/dL (ref 8.4–10.5)
Glucose, Bld: 159 mg/dL — ABNORMAL HIGH (ref 70–99)
Sodium: 133 mEq/L — ABNORMAL LOW (ref 135–145)

## 2013-03-18 NOTE — Telephone Encounter (Signed)
pt notified about lab results. I asked pt if she had lasix and K+ dose for this afternoon, she said no. I advised to hold this afternoon dose lasix/K+ and resume 7/1. STAT BMET 7/3, pt verbalized understanding to instructions

## 2013-03-18 NOTE — Telephone Encounter (Signed)
Message copied by Tarri Fuller on Mon Mar 18, 2013  5:37 PM ------      Message from: [place], [place] T      Created: Mon Mar 18, 2013  5:27 PM       Creatinine stable      May be getting too dry.      Hold Lasix and K+ this afternoon.  If already taken, hold tomorrow afternoon.      Keep holding metolazone.      Repeat BMET Thursday AM and run stat so we have results before closing.      Keep weighing daily and notify if weight up 3 lbs in one day or increased swelling or dyspnea.      Tereso Newcomer, PA-C        03/18/2013 5:26 PM ------

## 2013-03-21 ENCOUNTER — Telehealth: Payer: Self-pay | Admitting: *Deleted

## 2013-03-21 ENCOUNTER — Telehealth: Payer: Self-pay

## 2013-03-21 ENCOUNTER — Other Ambulatory Visit (INDEPENDENT_AMBULATORY_CARE_PROVIDER_SITE_OTHER): Payer: BC Managed Care – PPO

## 2013-03-21 DIAGNOSIS — I5033 Acute on chronic diastolic (congestive) heart failure: Secondary | ICD-10-CM

## 2013-03-21 LAB — BASIC METABOLIC PANEL
Chloride: 101 mEq/L (ref 96–112)
GFR: 72.01 mL/min (ref >60.00)
Glucose, Bld: 39 mg/dL — CL (ref 70–99)
Potassium: 4 mEq/L (ref 3.5–5.1)
Sodium: 141 mEq/L (ref 135–145)

## 2013-03-21 NOTE — Telephone Encounter (Signed)
please call patient: Take humalog only with a meal Ov next available

## 2013-03-21 NOTE — Telephone Encounter (Signed)
Called pt and lvm that Dr Everardo All said to take humalog only with a meal and to call back on Monday and schedule an OV at next available time.

## 2013-03-21 NOTE — Telephone Encounter (Signed)
Returned call to patient she stated she just received a call from Dr.Ellison's office.Stated she knows to take humalog with a meal.Stated she will call Dr.Ellison's office on 03/25/13 to schedule appointment.

## 2013-03-21 NOTE — Telephone Encounter (Signed)
Received a call from lab patient's blood sugar today 03/21/13 39.Patient called she stated she did not eat this morning but did take humalog insulin 40 uts at 12 noon.Stated she has ate now.Advised to hold insulin today and call Dr.Ellison who manages diabetes for advice on insulin dosage.Spoke to DOD Dr.Allred he agrees.Message sent to Dr.Ellison

## 2013-03-25 ENCOUNTER — Telehealth: Payer: Self-pay | Admitting: *Deleted

## 2013-03-25 NOTE — Telephone Encounter (Signed)
Message copied by Tarri Fuller on Mon Mar 25, 2013 10:21 AM ------      Message from: [place], [place] T      Created: Fri Mar 22, 2013  7:42 PM       Hypoglycemia addressed by DOD and endocrinologist on 7/3.      K+ and creatinine ok now.      Continue with current treatment plan.      Weigh daily.  If weight up 3 lbs in one day, increased swelling or increased dyspnea - take metolazone 2.5 mg that day only with extra K+ 40 meq x 1 and call office.       Tereso Newcomer, PA-C        03/22/2013 7:39 PM ------

## 2013-03-25 NOTE — Telephone Encounter (Signed)
pt notified about her lab results with verbal understanding.

## 2013-03-29 ENCOUNTER — Ambulatory Visit: Payer: BC Managed Care – PPO | Admitting: Physician Assistant

## 2013-04-03 ENCOUNTER — Ambulatory Visit: Payer: BC Managed Care – PPO | Admitting: Endocrinology

## 2013-04-04 ENCOUNTER — Ambulatory Visit: Payer: BC Managed Care – PPO | Admitting: Endocrinology

## 2013-04-05 ENCOUNTER — Encounter: Payer: Self-pay | Admitting: Endocrinology

## 2013-04-05 ENCOUNTER — Ambulatory Visit (INDEPENDENT_AMBULATORY_CARE_PROVIDER_SITE_OTHER): Payer: BC Managed Care – PPO | Admitting: Endocrinology

## 2013-04-05 ENCOUNTER — Telehealth: Payer: Self-pay | Admitting: *Deleted

## 2013-04-05 VITALS — BP 100/50 | HR 102 | Temp 98.4°F | Ht 67.0 in | Wt 318.6 lb

## 2013-04-05 DIAGNOSIS — E119 Type 2 diabetes mellitus without complications: Secondary | ICD-10-CM

## 2013-04-05 DIAGNOSIS — M25579 Pain in unspecified ankle and joints of unspecified foot: Secondary | ICD-10-CM

## 2013-04-05 LAB — MICROALBUMIN / CREATININE URINE RATIO
Creatinine,U: 63.9 mg/dL
Microalb Creat Ratio: 2.3 mg/g (ref 0.0–30.0)

## 2013-04-05 NOTE — Progress Notes (Signed)
Subjective:    Patient ID: Felicia Rivas, female    DOB: Dec 05, 1955, 57 y.o.   MRN: [id number]  HPI pt returns for f/u of insulin-requiring DM (dx'ed 2004; she has mild if any neuropathy of the lower extremities; she has associated retinopathy and PAD).  no cbg record, but states cbg's are still frequently low in the early hrs of the am.   Past Medical History  Diagnosis Date  . Asthma   . Diabetes mellitus   . Sleep disturbance     Had sleep study several years ago, wasn't told she has OSA but told she needs O2 at night  . CAD (coronary artery disease)     NSTEMI 12/12:   LHC 09/02/11: LAD 70-80%, OM2 80%, small postero-lateral segment of the AV circumflex 60-70%, proximal to mid RCA 40%, acute margin 30%, 80% at the takeoff of the PDA.  . 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 Laneta Simmers;   LIMA-LAD, SVG-OM 2, SVG-PDA.  [name] Chronic diastolic heart failure     a. Echo 08/2011: mod LVH, nl EF. b. Echo 01/2013: EF 55-60%, mod-sev LVH, mod-sev LA dilitation.  . Carotid stenosis     a. dopplers 09/2012 showed a 60-79% right and 40-59% left stenosis, f/u dopplers due 6 months  . HLD (hyperlipidemia)   . History of chicken pox   . Complication of anesthesia   . PONV (postoperative nausea and vomiting)   . Stroke 2006    denies residual  . Cough   . Morbid obesity     Past Surgical History  Procedure Laterality Date  . Cholecystectomy  1990's  . Tubal ligation  1981  . Coronary artery bypass graft  09/07/2011    CABG X3; Procedure: CORONARY ARTERY BYPASS GRAFTING (CABG);  Surgeon: Alleen Borne, MD;  Location: [facility] OR;  Service: Open Heart Surgery;  Laterality: N/A;    History   Social History  . Marital Status: Married    Spouse Name: N/A    Number of Children: 3  . Years of Education: 16   Occupational History  . Teacher    Social History Main Topics  . Smoking status: Former Smoker -- 0.50 packs/day for 25 years    Types: Cigarettes  . Smokeless tobacco: Former Neurosurgeon    Quit date:  09/19/2012  . Alcohol Use: No  . Drug Use: No  . Sexually Active: No   Other Topics Concern  . Not on file   Social History Narrative   Regular exercise-yes    Current Outpatient Prescriptions on File Prior to Visit  Medication Sig Dispense Refill  . albuterol (PROVENTIL HFA;VENTOLIN HFA) 108 (90 BASE) MCG/ACT inhaler Inhale 1 puff into the lungs every 4 (four) hours as needed for shortness of breath.      [name] albuterol (PROVENTIL) (2.5 MG/3ML) 0.083% nebulizer solution Take 2.5 mg by nebulization 2 (two) times daily as needed. For shortness of breath and wheezing      . aspirin EC 325 MG EC tablet Take 1 tablet (325 mg total) by mouth daily.  30 tablet    . benzonatate (TESSALON) 200 MG capsule Take 1 capsule (200 mg total) by mouth 3 (three) times daily as needed for cough.  30 capsule  0  . dextromethorphan-guaiFENesin (MUCINEX DM) 30-600 MG per 12 hr tablet Take 1 tablet by mouth 2 (two) times daily as needed (cough).  20 tablet  0  . furosemide (LASIX) 40 MG tablet Take 2 tablets (80 mg total)  by mouth 2 (two) times daily. May take an extra 1 tablet (40mg  total) daily as needed for weight gain of 2 pounds. If you take this extra Lasix, take 1 additional potassium tablet that day.  30 tablet    . ibuprofen (ADVIL,MOTRIN) 200 MG tablet Take 800 mg by mouth 2 (two) times daily as needed. For pain      . losartan (COZAAR) 25 MG tablet Take 1 tablet (25 mg total) by mouth daily.  30 tablet  6  . metolazone (ZAROXOLYN) 2.5 MG tablet On 6/20, 6/21, 6/22 take 2.5 mg 30 minutes before morning lasix dose; starting 6/24 take only on  Tues, thurs, saturdays  90 tablet  3  . metoprolol succinate (TOPROL-XL) 50 MG 24 hr tablet Take 1 tablet (50 mg total) by mouth daily.  30 tablet  6  . omeprazole (PRILOSEC) 20 MG capsule Take 1 capsule (20 mg total) by mouth daily.  30 capsule  3  . potassium chloride SA (K-DUR,KLOR-CON) 20 MEQ tablet Take 1 tablet (20 mEq total) by mouth 2 (two) times daily. TAKE  EXTRA 40 MEQ TWICE DAILY ON METOLAZONE DAYS ONLY       No current facility-administered medications on file prior to visit.    Allergies  Allergen Reactions  . Lisinopril     ?Cough - changed to ARB 02/2013.    Family History  Problem Relation Age of Onset  . Cancer Mother   . Heart failure Mother     Also has hx MVP s/p MVR  . Diabetes Mother   . Coronary artery disease Father     Died of MI at age 44  . Heart disease Other     Parent    BP 100/50  Pulse 102  Temp(Src) 98.4 F (36.9 C) (Oral)  Ht 5\' 7"  (1.702 m)  Wt 318 lb 9 oz (144.499 kg)  BMI 49.88 kg/m2  SpO2 96%  Review of Systems Denies LOC and weight change    Objective:   Physical Exam VITAL SIGNS:  See vs page GENERAL: no distress  Lab Results  Component Value Date   HGBA1C 9.6* 04/05/2013      Assessment & Plan:  DM: This insulin regimen was chosen from multiple options, as it best matches her insulin to her changing requirements throughout the day.  The benefits of glycemic control must be weighed against the risks of hypoglycemia.  She needs increased rx

## 2013-04-05 NOTE — Telephone Encounter (Signed)
Called pt and advised her that her HgbA1c was 9.6. Please adjust insulin as we discussed. We will see you at your next follow up appt. Pt understood.

## 2013-04-05 NOTE — Patient Instructions (Addendum)
check your blood sugar 2 times a day.  vary the time of day when you check, between before the 3 meals, and at bedtime.  also check if you have symptoms of your blood sugar being too high or too low.  please keep a record of the readings and bring it to your next appointment here.  please call us sooner if your blood sugar goes below 70, or if it stays over 200. blood tests are being requested for you today.  We'll contact you with results. Please come back for a follow-up appointment in 1 month Please reduce lantus to 30 units 2x a day, and:  Increase humalog to 3x a day (just before each meal), 70-50-70 units.

## 2013-05-03 DIAGNOSIS — R0989 Other specified symptoms and signs involving the circulatory and respiratory systems: Secondary | ICD-10-CM

## 2013-08-14 ENCOUNTER — Encounter (HOSPITAL_COMMUNITY): Payer: BC Managed Care – PPO

## 2013-09-16 ENCOUNTER — Encounter: Payer: Self-pay | Admitting: Cardiology

## 2013-09-16 ENCOUNTER — Ambulatory Visit (HOSPITAL_COMMUNITY): Payer: BC Managed Care – PPO | Attending: Cardiology

## 2013-09-16 DIAGNOSIS — I6529 Occlusion and stenosis of unspecified carotid artery: Secondary | ICD-10-CM

## 2013-09-16 DIAGNOSIS — E785 Hyperlipidemia, unspecified: Secondary | ICD-10-CM | POA: Insufficient documentation

## 2013-09-16 DIAGNOSIS — E119 Type 2 diabetes mellitus without complications: Secondary | ICD-10-CM | POA: Insufficient documentation

## 2013-09-16 DIAGNOSIS — I658 Occlusion and stenosis of other precerebral arteries: Secondary | ICD-10-CM | POA: Insufficient documentation

## 2013-09-16 DIAGNOSIS — Z87891 Personal history of nicotine dependence: Secondary | ICD-10-CM | POA: Insufficient documentation

## 2013-09-16 DIAGNOSIS — I10 Essential (primary) hypertension: Secondary | ICD-10-CM | POA: Insufficient documentation

## 2013-09-16 DIAGNOSIS — I2510 Atherosclerotic heart disease of native coronary artery without angina pectoris: Secondary | ICD-10-CM | POA: Insufficient documentation

## 2013-09-17 ENCOUNTER — Encounter: Payer: Self-pay | Admitting: Physician Assistant

## 2013-09-18 ENCOUNTER — Telehealth: Payer: Self-pay | Admitting: *Deleted

## 2013-09-18 DIAGNOSIS — I6523 Occlusion and stenosis of bilateral carotid arteries: Secondary | ICD-10-CM

## 2013-09-18 NOTE — Telephone Encounter (Signed)
pt notified about carotid doppler results and that we will repeat carotids in 6 months and that scheduling will call her and arrange date and time.

## 2013-10-24 ENCOUNTER — Other Ambulatory Visit: Payer: Self-pay | Admitting: Cardiology

## 2013-11-28 ENCOUNTER — Other Ambulatory Visit: Payer: Self-pay | Admitting: *Deleted

## 2013-11-28 MED ORDER — FUROSEMIDE 40 MG PO TABS: ORAL_TABLET | ORAL | Status: DC

## 2013-12-13 ENCOUNTER — Other Ambulatory Visit: Payer: Self-pay | Admitting: *Deleted

## 2013-12-13 MED ORDER — METOPROLOL SUCCINATE ER 50 MG PO TB24: 50.0000 mg | ORAL_TABLET | Freq: Every day | ORAL | Status: DC

## 2013-12-17 ENCOUNTER — Ambulatory Visit: Payer: BC Managed Care – PPO | Admitting: Physician Assistant

## 2013-12-24 ENCOUNTER — Encounter: Payer: Self-pay | Admitting: Physician Assistant

## 2013-12-24 DIAGNOSIS — I6529 Occlusion and stenosis of unspecified carotid artery: Secondary | ICD-10-CM | POA: Insufficient documentation

## 2013-12-24 NOTE — Progress Notes (Signed)
[address] [place], [place]  [address] Phone: [phone] Fax:  [phone]  Date:  12/25/2013   Patient ID:  Felicia BeckmannSharon Rivas, DOB 02/02/1956, MRN [id number]   PCP:  No PCP Per Patient  Cardiologist: Jens Somrenshaw  History of Present Illness: Felicia Rivas is a 58 y.o. morbidly obese female with history of CAD (NSTEMI/CABGx3 in 2012), chronic diastolic CHF, HTN, HL, carotid stenosis. Last echo 01/2013: severe LVH, EF 55-60%, mod-severely dilated LA. Carotids 08/2013: 60-79% RICA, 40-59% LICA, stable, severe RECA stenosis, moderate LECA stenosis, f/u planned 6 months from study. Last OV 02/2013 for dCHF.  She presents today for routine follow-up. She is doing very well. She has not had any SOB, DOE, LEE, orthopnea. She denies any exertional CP. She has brief fleeting shooting pain around her incision infrequently which has been present since surgery. She walks about a mile a day in her yard/neighborhood without any exertional symptoms. She is making an effort to follow a low salt diet. She has lost 6 lbs since last OV. She has not had to take metolazone in several weeks - now only takes this PRN. Takes Lasix 80mg  BID without any recent needs for titration.  Recent Labs: 02/15/2013: ALT 19; Pro B Natriuretic peptide (BNP) 325.6*  02/16/2013: TSH 2.566  02/18/2013: Hemoglobin 14.0  03/21/2013: Creatinine 0.9; Potassium 4.0   Wt Readings from Last 3 Encounters:  12/25/13 312 lb (141.522 kg)  04/05/13 318 lb 9 oz (144.499 kg)  03/07/13 318 lb (144.244 kg)     Past Medical History  Diagnosis Date  . Asthma   . Diabetes mellitus   . Sleep disturbance     Had sleep study several years ago, wasn't told she has OSA but told she needs O2 at night  . CAD (coronary artery disease)     a. NSTEMI 12/12 -> s/p CABGx3.   . 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 Laneta SimmersBartle;   LIMA-LAD, SVG-OM 2, SVG-PDA.  [name]. Chronic diastolic heart failure     a. Echo 08/2011: mod LVH, nl EF. b. Echo 01/2013: EF 55-60%,  mod-sev LVH, mod-sev LA dilitation.  . Carotid stenosis     a. Carotid US (08/2013):  R 60-79%; L 40-59%; f/u 6 mos  . HLD (hyperlipidemia)   . History of chicken pox   . Complication of anesthesia   . PONV (postoperative nausea and vomiting)   . Stroke 2006    denies residual  . Cough   . Morbid obesity   . Cough     a. 2014: Suspect a combination of CHF, GERD and ACE inhibitor-induced cough.  [name]. HTN (hypertension)     Current Outpatient Prescriptions  Medication Sig Dispense Refill  . albuterol (PROVENTIL HFA;VENTOLIN HFA) 108 (90 BASE) MCG/ACT inhaler Inhale 1 puff into the lungs every 4 (four) hours as needed for shortness of breath.      [name]. albuterol (PROVENTIL) (2.5 MG/3ML) 0.083% nebulizer solution Take 2.5 mg by nebulization 2 (two) times daily as needed. For shortness of breath and wheezing      . aspirin EC 325 MG EC tablet Take 1 tablet (325 mg total) by mouth daily.  30 tablet    . benzonatate (TESSALON) 200 MG capsule Take 1 capsule (200 mg total) by mouth 3 (three) times daily as needed for cough.  30 capsule  0  . dextromethorphan-guaiFENesin (MUCINEX DM) 30-600 MG per 12 hr tablet Take 1 tablet by mouth 2 (two) times daily as  needed (cough).  20 tablet  0  . furosemide (LASIX) 40 MG tablet Take 2 tablets (80 mg total) by mouth 2 (two) times daily. May take an extra 1 tablet (40mg  total) daily as needed for weight gain of 2 pounds. If you take this extra Lasix, take 1 additional potassium tablet that day.  30 tablet    . HUMULIN R 500 UNIT/ML SOLN injection       . ibuprofen (ADVIL,MOTRIN) 200 MG tablet Take 800 mg by mouth 2 (two) times daily as needed. For pain      . insulin glargine (LANTUS) 100 UNIT/ML injection Inject 30 Units into the skin 2 (two) times daily.      . insulin lispro (HUMALOG) 100 UNIT/ML injection 3x a day (just before each meal), 70-50-70 units.      [name] losartan (COZAAR) 25 MG tablet Take 1 tablet (25 mg total) by mouth daily.  30 tablet  6  .  metolazone (ZAROXOLYN) 2.5 MG tablet Takes only PRN - no more than 3 times per week.  90 tablet  3  . metoprolol succinate (TOPROL-XL) 50 MG 24 hr tablet Take 1 tablet (50 mg total) by mouth daily.  30 tablet  0  . omeprazole (PRILOSEC) 20 MG capsule Take 1 capsule (20 mg total) by mouth daily.  30 capsule  3  . potassium chloride SA (K-DUR,KLOR-CON) 20 MEQ tablet Take 1 tablet (20 mEq total) by mouth 2 (two) times daily. TAKE EXTRA 40 MEQ TWICE DAILY ON METOLAZONE DAYS ONLY      . VICTOZA 18 MG/3ML SOPN        No current facility-administered medications for this visit.    Allergies:   Lisinopril   Social History:  The patient  reports that she has quit smoking. Her smoking use included Cigarettes. She has a 12.5 pack-year smoking history. She quit smokeless tobacco use about 15 months ago. She reports that she does not drink alcohol or use illicit drugs.   Family History:  The patient's family history includes Cancer in her mother; Coronary artery disease in her father; Diabetes in her mother; Heart disease in her other; Heart failure in her mother.  ROS:  Please see the history of present illness. No bleeding issues. All other systems reviewed and negative.   PHYSICAL EXAM:  VS:  BP 136/64  Pulse 91  Ht 5\' 3"  (1.6 m)  Wt 312 lb (141.522 kg)  BMI 55.28 kg/m2 Well nourished, well developed obese WF in no acute distress HEENT: normal Neck: no JVD, very mild bilat carotid bruit Cardiac:  normal S1, S2; RRR; no murmur, well healed sternal scar Lungs:  clear to auscultation bilaterally, no wheezing, rhonchi or rales Abd: soft, nontender, no hepatomegaly Ext: no edema Skin: warm and dry Neuro:  moves all extremities spontaneously, no focal abnormalities noted, wears glasses  EKG:  NSR 91bpm, left axis deviation, LVH with repol abnormality, QTc 489 (NSIVCD QRS 112), no acute changes from prior   ASSESSMENT AND PLAN:  1. Chronic diastolic CHF - currently compensated. Continue current  regimen. OK to refill meds. Rechecking renal function and lytes today. 2. CAD s/p CABG 2012 - suspect neuropathic fleeting CP due to sternotomy. No symptoms to suggest angina and EKG stable. She was educated to notify us for symptoms concerning for angina. Continue aspirin and beta blocker. Adding statin as below in light of CAD/DM. 3. HTN - controlled. 4. Hyperlipidemia - add Lipitor 40mg  QPM. Check CMET/lipids today and again in 8  weeks. 5. Carotid stenosis - future duplex planned as above in June. 6. Morbid obesity Body mass index is 55.28 kg/(m^2).- we discussed continued progression of activity and lifestyle modifications. I suggested trying to up the pace of her daily exercise, adding hand weights, or exercising for longer if tolerated.  Dispo: F/u Dr. Jens Som 6 months. She is also following closely with a diabetic doctor.  Signed, Ronie Spies, PA-C  12/25/2013 11:32 AM

## 2013-12-25 ENCOUNTER — Ambulatory Visit (INDEPENDENT_AMBULATORY_CARE_PROVIDER_SITE_OTHER): Payer: BC Managed Care – PPO | Admitting: Physician Assistant

## 2013-12-25 ENCOUNTER — Other Ambulatory Visit: Payer: Self-pay | Admitting: *Deleted

## 2013-12-25 ENCOUNTER — Encounter: Payer: Self-pay | Admitting: Physician Assistant

## 2013-12-25 VITALS — BP 136/64 | HR 91 | Ht 63.0 in | Wt 312.0 lb

## 2013-12-25 DIAGNOSIS — I2510 Atherosclerotic heart disease of native coronary artery without angina pectoris: Secondary | ICD-10-CM

## 2013-12-25 DIAGNOSIS — I5032 Chronic diastolic (congestive) heart failure: Secondary | ICD-10-CM

## 2013-12-25 DIAGNOSIS — I10 Essential (primary) hypertension: Secondary | ICD-10-CM

## 2013-12-25 DIAGNOSIS — I6529 Occlusion and stenosis of unspecified carotid artery: Secondary | ICD-10-CM

## 2013-12-25 LAB — COMPREHENSIVE METABOLIC PANEL
ALBUMIN: 3.5 g/dL (ref 3.5–5.2)
ALT: 14 U/L (ref 0–35)
AST: 15 U/L (ref 0–37)
Alkaline Phosphatase: 98 U/L (ref 39–117)
BUN: 16 mg/dL (ref 6–23)
CALCIUM: 9.6 mg/dL (ref 8.4–10.5)
CHLORIDE: 100 meq/L (ref 96–112)
CO2: 30 meq/L (ref 19–32)
Creatinine, Ser: 0.8 mg/dL (ref 0.4–1.2)
GFR: 75.88 mL/min (ref >60.00)
GLUCOSE: 156 mg/dL — AB (ref 70–99)
POTASSIUM: 3.9 meq/L (ref 3.5–5.1)
Sodium: 139 mEq/L (ref 135–145)
Total Bilirubin: 0.4 mg/dL (ref 0.3–1.2)
Total Protein: 6.8 g/dL (ref 6.0–8.3)

## 2013-12-25 LAB — LIPID PANEL
Cholesterol: 227 mg/dL — ABNORMAL HIGH (ref 0–200)
HDL: 32.4 mg/dL — ABNORMAL LOW (ref >39.00)
LDL Cholesterol: 159 mg/dL — ABNORMAL HIGH (ref 0–99)
TRIGLYCERIDES: 178 mg/dL — AB (ref 0.0–149.0)
Total CHOL/HDL Ratio: 7
VLDL: 35.6 mg/dL (ref 0.0–40.0)

## 2013-12-25 MED ORDER — ATORVASTATIN CALCIUM 40 MG PO TABS: 40.0000 mg | ORAL_TABLET | Freq: Every day | ORAL | Status: DC

## 2013-12-25 MED ORDER — POTASSIUM CHLORIDE CRYS ER 20 MEQ PO TBCR: 20.0000 meq | EXTENDED_RELEASE_TABLET | Freq: Two times a day (BID) | ORAL | Status: DC

## 2013-12-25 MED ORDER — METOPROLOL SUCCINATE ER 50 MG PO TB24: 50.0000 mg | ORAL_TABLET | Freq: Every day | ORAL | Status: DC

## 2013-12-25 MED ORDER — FUROSEMIDE 40 MG PO TABS: 80.0000 mg | ORAL_TABLET | Freq: Two times a day (BID) | ORAL | Status: DC

## 2013-12-25 MED ORDER — LOSARTAN POTASSIUM 25 MG PO TABS: 25.0000 mg | ORAL_TABLET | Freq: Every day | ORAL | Status: DC

## 2013-12-25 NOTE — Patient Instructions (Signed)
Your physician recommends that you have FASTING lipid profile and CMET today   Your physician recommends that you return for a FASTING lipid profile and CMET in 8 weeks  Your physician has recommended you make the following change in your medication:  1. Start Lipitor 40 mg daily  Your physician wants you to follow-up in: 6 months with DR. Crenshaw  You will receive a reminder letter in the mail two months in advance. If you don't receive a letter, please call our office to schedule the follow-up appointment.

## 2013-12-26 ENCOUNTER — Other Ambulatory Visit: Payer: Self-pay | Admitting: *Deleted

## 2013-12-26 DIAGNOSIS — I5032 Chronic diastolic (congestive) heart failure: Secondary | ICD-10-CM

## 2013-12-26 MED ORDER — POTASSIUM CHLORIDE CRYS ER 20 MEQ PO TBCR: 20.0000 meq | EXTENDED_RELEASE_TABLET | Freq: Two times a day (BID) | ORAL | Status: DC

## 2013-12-31 ENCOUNTER — Other Ambulatory Visit: Payer: Self-pay | Admitting: Cardiology

## 2014-01-01 ENCOUNTER — Telehealth: Payer: Self-pay | Admitting: Physician Assistant

## 2014-01-01 ENCOUNTER — Other Ambulatory Visit: Payer: Self-pay | Admitting: Cardiology

## 2014-01-01 MED ORDER — FUROSEMIDE 40 MG PO TABS: 80.0000 mg | ORAL_TABLET | Freq: Two times a day (BID) | ORAL | Status: DC

## 2014-01-01 NOTE — Telephone Encounter (Signed)
Pt is aware of labs and PA's recommendations. A prescription for Lasix 40 mg 2 tabs(80 mg) by mouth twice a day, called in to [facility] pharmacist Rhonda. Dunn PA-C.

## 2014-01-01 NOTE — Telephone Encounter (Signed)
Follow up      Pt is returning a nurses call to get lab work

## 2014-01-01 NOTE — Telephone Encounter (Signed)
Patient is returning call from yesterday, please call back.

## 2014-01-01 NOTE — Telephone Encounter (Signed)
Left pt a message to call back regarding labs.

## 2014-03-03 ENCOUNTER — Telehealth: Payer: Self-pay | Admitting: Cardiology

## 2014-03-03 NOTE — Telephone Encounter (Signed)
[employer] paper Rec Via Fax, Will give to [employer]ra M   6.16.15/km

## 2014-04-11 ENCOUNTER — Telehealth: Payer: Self-pay

## 2014-04-11 NOTE — Telephone Encounter (Signed)
Received a call from patient she stated Dr.Crenshaw recently filled out a DOT form.Stated she received a call this morning stating they received form but failed to enter in computer.Stated she would like a copy of that form or she will bring another form to complete.Message sent to Dr.Crenshaw's nurse Deliah Goodyebra Mathis RN.

## 2014-04-14 NOTE — Telephone Encounter (Signed)
Left message for pt to call.

## 2014-05-22 ENCOUNTER — Other Ambulatory Visit: Payer: Self-pay

## 2014-05-22 MED ORDER — FUROSEMIDE 40 MG PO TABS: 80.0000 mg | ORAL_TABLET | Freq: Two times a day (BID) | ORAL | Status: DC

## 2014-05-28 ENCOUNTER — Other Ambulatory Visit: Payer: Self-pay

## 2014-05-28 MED ORDER — FUROSEMIDE 40 MG PO TABS: 80.0000 mg | ORAL_TABLET | Freq: Two times a day (BID) | ORAL | Status: DC

## 2014-06-06 ENCOUNTER — Other Ambulatory Visit: Payer: Self-pay

## 2014-06-06 DIAGNOSIS — I5032 Chronic diastolic (congestive) heart failure: Secondary | ICD-10-CM

## 2014-06-06 MED ORDER — FUROSEMIDE 40 MG PO TABS: ORAL_TABLET | ORAL | Status: DC

## 2014-07-25 ENCOUNTER — Other Ambulatory Visit: Payer: Self-pay | Admitting: Cardiology

## 2014-07-25 MED ORDER — ATORVASTATIN CALCIUM 40 MG PO TABS: 40.0000 mg | ORAL_TABLET | Freq: Every day | ORAL | Status: DC

## 2014-07-25 MED ORDER — LOSARTAN POTASSIUM 25 MG PO TABS: 25.0000 mg | ORAL_TABLET | Freq: Every day | ORAL | Status: DC

## 2014-08-22 ENCOUNTER — Other Ambulatory Visit: Payer: Self-pay

## 2014-08-22 MED ORDER — METOPROLOL SUCCINATE ER 50 MG PO TB24: 50.0000 mg | ORAL_TABLET | Freq: Every day | ORAL | Status: DC

## 2014-08-27 ENCOUNTER — Encounter (HOSPITAL_COMMUNITY): Payer: Self-pay | Admitting: Cardiology

## 2014-10-27 ENCOUNTER — Other Ambulatory Visit: Payer: Self-pay

## 2014-10-27 MED ORDER — FUROSEMIDE 40 MG PO TABS: ORAL_TABLET | ORAL | Status: DC

## 2014-10-27 MED ORDER — ATORVASTATIN CALCIUM 40 MG PO TABS: 40.0000 mg | ORAL_TABLET | Freq: Every day | ORAL | Status: DC

## 2014-10-27 MED ORDER — LOSARTAN POTASSIUM 25 MG PO TABS: 25.0000 mg | ORAL_TABLET | Freq: Every day | ORAL | Status: DC

## 2014-11-10 ENCOUNTER — Telehealth: Payer: Self-pay | Admitting: Cardiology

## 2014-11-10 ENCOUNTER — Observation Stay (HOSPITAL_COMMUNITY)
Admission: EM | Admit: 2014-11-10 | Discharge: 2014-11-11 | Disposition: A | Discharge status: Home / Self Care | Payer: BLUE CROSS/BLUE SHIELD | Attending: Cardiology | Admitting: Cardiology

## 2014-11-10 ENCOUNTER — Encounter (HOSPITAL_COMMUNITY): Payer: Self-pay | Admitting: Emergency Medicine

## 2014-11-10 ENCOUNTER — Emergency Department (HOSPITAL_COMMUNITY): Payer: BLUE CROSS/BLUE SHIELD

## 2014-11-10 ENCOUNTER — Encounter: Payer: Self-pay | Admitting: Cardiology

## 2014-11-10 DIAGNOSIS — K219 Gastro-esophageal reflux disease without esophagitis: Secondary | ICD-10-CM | POA: Insufficient documentation

## 2014-11-10 DIAGNOSIS — R0602 Shortness of breath: Secondary | ICD-10-CM | POA: Diagnosis present

## 2014-11-10 DIAGNOSIS — I10 Essential (primary) hypertension: Secondary | ICD-10-CM | POA: Diagnosis not present

## 2014-11-10 DIAGNOSIS — I252 Old myocardial infarction: Secondary | ICD-10-CM | POA: Diagnosis not present

## 2014-11-10 DIAGNOSIS — N289 Disorder of kidney and ureter, unspecified: Secondary | ICD-10-CM

## 2014-11-10 DIAGNOSIS — J45909 Unspecified asthma, uncomplicated: Secondary | ICD-10-CM | POA: Insufficient documentation

## 2014-11-10 DIAGNOSIS — R05 Cough: Secondary | ICD-10-CM | POA: Insufficient documentation

## 2014-11-10 DIAGNOSIS — I5033 Acute on chronic diastolic (congestive) heart failure: Secondary | ICD-10-CM | POA: Diagnosis not present

## 2014-11-10 DIAGNOSIS — I959 Hypotension, unspecified: Secondary | ICD-10-CM

## 2014-11-10 DIAGNOSIS — R059 Cough, unspecified: Secondary | ICD-10-CM | POA: Diagnosis present

## 2014-11-10 DIAGNOSIS — I2510 Atherosclerotic heart disease of native coronary artery without angina pectoris: Secondary | ICD-10-CM | POA: Insufficient documentation

## 2014-11-10 DIAGNOSIS — I779 Disorder of arteries and arterioles, unspecified: Secondary | ICD-10-CM | POA: Diagnosis present

## 2014-11-10 DIAGNOSIS — Z6841 Body Mass Index (BMI) 40.0 and over, adult: Secondary | ICD-10-CM | POA: Insufficient documentation

## 2014-11-10 DIAGNOSIS — I5032 Chronic diastolic (congestive) heart failure: Secondary | ICD-10-CM

## 2014-11-10 DIAGNOSIS — E119 Type 2 diabetes mellitus without complications: Secondary | ICD-10-CM | POA: Diagnosis not present

## 2014-11-10 DIAGNOSIS — E785 Hyperlipidemia, unspecified: Secondary | ICD-10-CM | POA: Diagnosis not present

## 2014-11-10 DIAGNOSIS — I739 Peripheral vascular disease, unspecified: Secondary | ICD-10-CM

## 2014-11-10 DIAGNOSIS — J069 Acute upper respiratory infection, unspecified: Secondary | ICD-10-CM | POA: Diagnosis not present

## 2014-11-10 DIAGNOSIS — T464X5A Adverse effect of angiotensin-converting-enzyme inhibitors, initial encounter: Secondary | ICD-10-CM

## 2014-11-10 DIAGNOSIS — Z951 Presence of aortocoronary bypass graft: Secondary | ICD-10-CM | POA: Insufficient documentation

## 2014-11-10 DIAGNOSIS — R42 Dizziness and giddiness: Secondary | ICD-10-CM

## 2014-11-10 DIAGNOSIS — J209 Acute bronchitis, unspecified: Secondary | ICD-10-CM | POA: Diagnosis present

## 2014-11-10 DIAGNOSIS — N179 Acute kidney failure, unspecified: Secondary | ICD-10-CM | POA: Insufficient documentation

## 2014-11-10 DIAGNOSIS — I517 Cardiomegaly: Secondary | ICD-10-CM

## 2014-11-10 DIAGNOSIS — Z8673 Personal history of transient ischemic attack (TIA), and cerebral infarction without residual deficits: Secondary | ICD-10-CM | POA: Insufficient documentation

## 2014-11-10 DIAGNOSIS — Z8709 Personal history of other diseases of the respiratory system: Secondary | ICD-10-CM

## 2014-11-10 DIAGNOSIS — I9589 Other hypotension: Secondary | ICD-10-CM

## 2014-11-10 HISTORY — DX: Chronic diastolic (congestive) heart failure: I50.32

## 2014-11-10 HISTORY — DX: Peripheral vascular disease, unspecified: I73.9

## 2014-11-10 HISTORY — DX: Type 2 diabetes mellitus without complications: E11.9

## 2014-11-10 HISTORY — DX: Atherosclerotic heart disease of native coronary artery without angina pectoris: I25.10

## 2014-11-10 HISTORY — DX: Transient cerebral ischemic attack, unspecified: G45.9

## 2014-11-10 HISTORY — DX: Disorder of arteries and arterioles, unspecified: I77.9

## 2014-11-10 HISTORY — DX: Essential (primary) hypertension: I10

## 2014-11-10 HISTORY — DX: Presence of aortocoronary bypass graft: Z95.1

## 2014-11-10 HISTORY — DX: Hyperlipidemia, unspecified: E78.5

## 2014-11-10 HISTORY — DX: Unspecified asthma, uncomplicated: J45.909

## 2014-11-10 LAB — COMPREHENSIVE METABOLIC PANEL
ALK PHOS: 131 U/L — AB (ref 39–117)
ALT: 17 U/L (ref 0–35)
ANION GAP: 18 — AB (ref 5–15)
AST: 27 U/L (ref 0–37)
Albumin: 4.2 g/dL (ref 3.5–5.2)
BUN: 59 mg/dL — ABNORMAL HIGH (ref 6–23)
CALCIUM: 9.4 mg/dL (ref 8.4–10.5)
CHLORIDE: 94 mmol/L — AB (ref 96–112)
CO2: 26 mmol/L (ref 19–32)
CREATININE: 1.55 mg/dL — AB (ref 0.50–1.10)
GFR, EST AFRICAN AMERICAN: 41 mL/min — AB (ref >90)
GFR, EST NON AFRICAN AMERICAN: 36 mL/min — AB (ref >90)
Glucose, Bld: 101 mg/dL — ABNORMAL HIGH (ref 70–99)
POTASSIUM: 3.6 mmol/L (ref 3.5–5.1)
SODIUM: 138 mmol/L (ref 135–145)
Total Bilirubin: 0.8 mg/dL (ref 0.3–1.2)
Total Protein: 8 g/dL (ref 6.0–8.3)

## 2014-11-10 LAB — GLUCOSE, CAPILLARY: Glucose-Capillary: 239 mg/dL — ABNORMAL HIGH (ref 70–99)

## 2014-11-10 LAB — CBC WITH DIFFERENTIAL/PLATELET
BASOS ABS: 0.1 10*3/uL (ref 0.0–0.1)
Basophils Relative: 1 % (ref 0–1)
Eosinophils Absolute: 0.2 10*3/uL (ref 0.0–0.7)
Eosinophils Relative: 1 % (ref 0–5)
HCT: 45.2 % (ref 36.0–46.0)
Hemoglobin: 15.6 g/dL — ABNORMAL HIGH (ref 12.0–15.0)
Lymphocytes Relative: 20 % (ref 12–46)
Lymphs Abs: 3 10*3/uL (ref 0.7–4.0)
MCH: 30.2 pg (ref 26.0–34.0)
MCHC: 34.5 g/dL (ref 30.0–36.0)
MCV: 87.4 fL (ref 78.0–100.0)
MONO ABS: 0.7 10*3/uL (ref 0.1–1.0)
Monocytes Relative: 4 % (ref 3–12)
Neutro Abs: 11.3 10*3/uL — ABNORMAL HIGH (ref 1.7–7.7)
Neutrophils Relative %: 74 % (ref 43–77)
Platelets: 266 10*3/uL (ref 150–400)
RBC: 5.17 MIL/uL — ABNORMAL HIGH (ref 3.87–5.11)
RDW: 13.7 % (ref 11.5–15.5)
WBC: 15.3 10*3/uL — AB (ref 4.0–10.5)

## 2014-11-10 LAB — I-STAT CG4 LACTIC ACID, ED
Lactic Acid, Venous: 2.33 mmol/L (ref 0.5–2.0)
Lactic Acid, Venous: 1.38 mmol/L (ref 0.5–2.0)

## 2014-11-10 LAB — URINALYSIS, ROUTINE W REFLEX MICROSCOPIC
BILIRUBIN URINE: NEGATIVE
Glucose, UA: NEGATIVE mg/dL
Hgb urine dipstick: NEGATIVE
KETONES UR: NEGATIVE mg/dL
NITRITE: NEGATIVE
Protein, ur: NEGATIVE mg/dL
Specific Gravity, Urine: 1.02 (ref 1.005–1.030)
Urobilinogen, UA: 0.2 mg/dL (ref 0.0–1.0)
pH: 5 (ref 5.0–8.0)

## 2014-11-10 LAB — BRAIN NATRIURETIC PEPTIDE: B Natriuretic Peptide: 20.8 pg/mL (ref 0.0–100.0)

## 2014-11-10 LAB — TROPONIN I: Troponin I: <0.03 ng/mL (ref <0.031)

## 2014-11-10 LAB — URINE MICROSCOPIC-ADD ON

## 2014-11-10 LAB — I-STAT TROPONIN, ED: Troponin i, poc: 0.01 ng/mL (ref 0.00–0.08)

## 2014-11-10 MED ORDER — IPRATROPIUM-ALBUTEROL 0.5-2.5 (3) MG/3ML IN SOLN
3.0000 mL | Freq: Four times a day (QID) | RESPIRATORY_TRACT | Status: DC
  Administered 2014-11-10: 3 mL via RESPIRATORY_TRACT
  Filled 2014-11-10 (×2): qty 3

## 2014-11-10 MED ORDER — GUAIFENESIN-CODEINE 100-10 MG/5ML PO SOLN
10.0000 mL | Freq: Four times a day (QID) | ORAL | Status: DC | PRN
  Administered 2014-11-11: 10 mL via ORAL
  Filled 2014-11-10: qty 10

## 2014-11-10 MED ORDER — ENOXAPARIN SODIUM 40 MG/0.4ML ~~LOC~~ SOLN
40.0000 mg | SUBCUTANEOUS | Status: DC
  Administered 2014-11-10: 40 mg via SUBCUTANEOUS
  Filled 2014-11-10 (×2): qty 0.4

## 2014-11-10 MED ORDER — DEXTROSE 5 % IV SOLN
1.0000 g | Freq: Once | INTRAVENOUS | Status: AC
  Administered 2014-11-10: 1 g via INTRAVENOUS
  Filled 2014-11-10: qty 10

## 2014-11-10 MED ORDER — INSULIN ASPART 100 UNIT/ML ~~LOC~~ SOLN
0.0000 [IU] | Freq: Every day | SUBCUTANEOUS | Status: DC
  Administered 2014-11-10: 2 [IU] via SUBCUTANEOUS

## 2014-11-10 MED ORDER — LIRAGLUTIDE 18 MG/3ML ~~LOC~~ SOPN: 1.8000 mg | PEN_INJECTOR | Freq: Every day | SUBCUTANEOUS | Status: DC

## 2014-11-10 MED ORDER — FUROSEMIDE 10 MG/ML IJ SOLN
40.0000 mg | Freq: Once | INTRAMUSCULAR | Status: DC
  Filled 2014-11-10: qty 4

## 2014-11-10 MED ORDER — ATORVASTATIN CALCIUM 40 MG PO TABS
40.0000 mg | ORAL_TABLET | Freq: Every day | ORAL | Status: DC
  Administered 2014-11-10 – 2014-11-11 (×2): 40 mg via ORAL
  Filled 2014-11-10 (×2): qty 1

## 2014-11-10 MED ORDER — INSULIN ASPART 100 UNIT/ML ~~LOC~~ SOLN
0.0000 [IU] | Freq: Three times a day (TID) | SUBCUTANEOUS | Status: DC
  Administered 2014-11-11 (×2): 15 [IU] via SUBCUTANEOUS
  Administered 2014-11-11: 11 [IU] via SUBCUTANEOUS

## 2014-11-10 MED ORDER — SODIUM CHLORIDE 0.9 % IJ SOLN
3.0000 mL | Freq: Two times a day (BID) | INTRAMUSCULAR | Status: DC
  Administered 2014-11-11: 3 mL via INTRAVENOUS

## 2014-11-10 MED ORDER — IOHEXOL 350 MG/ML SOLN
80.0000 mL | Freq: Once | INTRAVENOUS | Status: AC | PRN
  Administered 2014-11-10: 80 mL via INTRAVENOUS

## 2014-11-10 MED ORDER — SODIUM CHLORIDE 0.9 % IV SOLN
INTRAVENOUS | Status: AC
  Administered 2014-11-10 – 2014-11-11 (×2): via INTRAVENOUS

## 2014-11-10 MED ORDER — IPRATROPIUM-ALBUTEROL 0.5-2.5 (3) MG/3ML IN SOLN
3.0000 mL | Freq: Once | RESPIRATORY_TRACT | Status: AC
  Administered 2014-11-10: 3 mL via RESPIRATORY_TRACT
  Filled 2014-11-10: qty 3

## 2014-11-10 NOTE — Telephone Encounter (Signed)
Pt c/o swelling: STAT is pt has developed SOB within 24 hours  1. How long have you been experiencing swelling? Since last tuesday  2. Where is the swelling located? Legs and hands  3.  Are you currently taking a "fluid pill"? 160mg  of Furosemide a day  4.  Are you currently SOB? She says that she has been coughing a lot, she uses oxygen at night  5.  Have you traveled recently?no  *SHE WOULD LIKE TO BE SEEN TODAY IF POSSIBLE*

## 2014-11-10 NOTE — ED Provider Notes (Signed)
CSN: [id number]     Arrival date & time 11/10/14  [id number] History   First MD Initiated Contact with Patient 11/10/14 1010     Chief Complaint  Patient presents with  . Shortness of Breath  . Hypotension     (Consider location/radiation/quality/duration/timing/severity/associated sxs/prior Treatment) HPI Patient presents to the emergency department with increasing shortness of breath over the last week with weight gain.  The patient states that she has had some offing as well.  Patient states she has exertional shortness of breath.  Patient states that she spoke with her cardiologist and they advised her to come to the emergency department.  Patient, states she has not had any chest pain, abdominal pain, nausea, vomiting, weakness, dizziness, blurred vision, fever, back pain, neck pain, dysuria, incontinence, or syncope.  Patient states nothing seems to make her condition better Past Medical History  Diagnosis Date  . Coronary artery disease   . Diabetes mellitus without complication   . CHF (congestive heart failure)   . Hypertension    History reviewed. No pertinent past surgical history. History reviewed. No pertinent family history. History  Substance Use Topics  . Smoking status: Never Smoker   . Smokeless tobacco: Not on file  . Alcohol Use: No   OB History    No data available     Review of Systems  All other systems negative except as documented in the HPI. All pertinent positives and negatives as reviewed in the HPI.  Allergies  Other  Home Medications   Prior to Admission medications   Medication Sig Start Date End Date Taking? Authorizing Provider  atorvastatin (LIPITOR) 40 MG tablet Take 40 mg by mouth daily.   Yes Historical Provider, MD  furosemide (LASIX) 40 MG tablet Take 80 mg by mouth 2 (two) times daily.   Yes Historical Provider, MD  insulin regular human CONCENTRATED (HUMULIN R) 500 UNIT/ML SOLN injection Inject 8-16 Units into the skin 3 (three) times  daily with meals. 20 units in the morning Mid day dose is sliding scale ~12 units in the evening Max 65 units daily   Yes Historical Provider, MD  Liraglutide 18 MG/3ML SOPN Inject 1.8 mg into the skin daily.   Yes Historical Provider, MD  losartan (COZAAR) 25 MG tablet Take 25 mg by mouth daily.   Yes Historical Provider, MD  metoprolol succinate (TOPROL-XL) 50 MG 24 hr tablet Take 50 mg by mouth daily. Take with or immediately following a meal.   Yes Historical Provider, MD  potassium chloride SA (K-DUR,KLOR-CON) 20 MEQ tablet Take 20 mEq by mouth 2 (two) times daily.   Yes Historical Provider, MD   BP 127/67 mmHg  Pulse 91  Temp(Src) 97.8 F (36.6 C) (Oral)  Resp 18  Wt 305 lb 4.8 oz (138.483 kg)  SpO2 98% Physical Exam  Constitutional: She is oriented to person, place, and time. She appears well-developed and well-nourished. No distress.  HENT:  Head: Normocephalic and atraumatic.  Mouth/Throat: Oropharynx is clear and moist.  Eyes: Pupils are equal, round, and reactive to light.  Neck: Normal range of motion. Neck supple.  Cardiovascular: Normal rate, regular rhythm and normal heart sounds.  Exam reveals no gallop and no friction rub.   No murmur heard. Pulmonary/Chest: Effort normal. She has no wheezes. She exhibits no tenderness.   Crackles noted on examination in the lung bases  Musculoskeletal: She exhibits edema.  Neurological: She is alert and oriented to person, place, and time. She exhibits normal muscle tone.  Coordination normal.  Skin: Skin is warm and dry. No rash noted. No erythema.  Psychiatric: She has a normal mood and affect. Her behavior is normal.  Nursing note and vitals reviewed.   ED Course  Procedures (including critical care time) Labs Review Labs Reviewed  CBC WITH DIFFERENTIAL/PLATELET - Abnormal; Notable for the following:    WBC 15.3 (*)    RBC 5.17 (*)    Hemoglobin 15.6 (*)    Neutro Abs 11.3 (*)    All other components within normal limits   I-STAT CG4 LACTIC ACID, ED - Abnormal; Notable for the following:    Lactic Acid, Venous 2.33 (*)    All other components within normal limits  COMPREHENSIVE METABOLIC PANEL  BRAIN NATRIURETIC PEPTIDE  I-STAT TROPOININ, ED    Imaging Review Dg Chest 2 View  11/10/2014   CLINICAL DATA:  59 year old female cough and shortness of Breath. Initial encounter.  EXAM: CHEST  2 VIEW  COMPARISON:  None.  FINDINGS: Mild cardiomegaly. Other mediastinal contours are within normal limits. Sequelae of CABG. No pneumothorax, pleural effusion, consolidation or confluent pulmonary opacity. Increased interstitial markings favor related to pulmonary vasculature. Calcified atherosclerosis of the aorta. Degenerative changes in the thoracic spine. No acute osseous abnormality identified.  IMPRESSION: Mild cardiomegaly and increased interstitial opacity favored due to pulmonary vascular congestion. Prior CABG.   Electronically Signed   By: Odessa Fleming M.D.   On: 11/10/2014 11:05     EKG Interpretation   Date/Time:  Monday November 10 2014 09:59:58 EST Ventricular Rate:  90 PR Interval:  158 QRS Duration: 116 QT Interval:  406 QTC Calculation: 496 R Axis:   -62 Text Interpretation:  Normal sinus rhythm Left axis deviation Left  ventricular hypertrophy with QRS widening and repolarization abnormality  Prolonged QT Abnormal ECG No significant change since last tracing  Confirmed by YAO  MD, DAVID ([id number]) on 11/10/2014 10:20:49 AM     I spoke with cardiology about the patient and they will come down to evaluate her for admission to the hospital MDM   Final diagnoses:  Shortness of breath       Carlyle Dolly, PA-C 11/10/14 1517  Richardean Canal, MD 11/10/14 [phone]

## 2014-11-10 NOTE — ED Notes (Signed)
Pt states that when standing she feels as if she has "tunnel vision".

## 2014-11-10 NOTE — Telephone Encounter (Signed)
Pt states weight gain, dyspnea, fluid excess. Unresolved coughing. Her typical weight is reported as 295. She noted she was 315 lbs on scale Wednesday, got as high as 325 lbs. 322 this AM.   She has been coughing on and off since Wednesday, verbalizes urinary incontinence associated w/ this and "nearly passing out" w/ coughing spells. Dyspnea w/ any exertion (walking from room to room). She uses O2 at night and has been using it during the daytime.   She does not have a method of checking O2 or BP at home.  I advised ED evaluation. Dr. Royann Shiversroitoru stated verbal agreement w/ this plan based on review of symptoms.

## 2014-11-10 NOTE — ED Notes (Signed)
Pt c/o SOB, weight gain and swelling; pt noted to be hypotensive; pt sts hx of CHF

## 2014-11-10 NOTE — H&P (Signed)
CARDIOLOGY CONSULT NOTE   Patient ID: Felicia Rivas MRN: [id number], DOB/AGE: [birth year]   Admit date: 11/10/2014 Date of Consult: 11/10/2014  Primary Physician: No primary care provider on file. Primary Cardiologist: Dr. Jens Som  Reason for consult: Shortness of Breath, cough   Problem List  Past Medical History  Diagnosis Date  . Coronary artery disease   . Diabetes mellitus without complication   . CHF (congestive heart failure)   . Hypertension     History reviewed. No pertinent past surgical history.   Allergies  Allergies  Allergen Reactions  . Other Hives    Unknown IV antibiotic   This patient has 2 medical record numbers. The majority of her records are under her other medical record number [id number]  HPI: 59 y/o F w/ PMHx of CAD (NSTEMI/CABGx3 in 2012), chronic diastolic CHF, HTN, HLD, and CA stenosis, presented to the ED w/ worsening shortness of breath and cough. Patient claims she started having a dry cough on Tuesday or Wednesday of last week w/ worsening SOB mostly w/ exertion. She states that she would have a difficult time walking the length of her house (70 feet) w/out becoming SOB. At first her edema was more marked than usual. She pushed her diuretic dose up and diuresed significantly. The edema completely disappeared. However her cough and shortness of breath did not improve.. The patient typically takes about 120 mg daily of Lasix (supposed to take 160 daily but can't take that much and work a normal day). She recently started taking 160 daily while her SOB was worse. She also admits to recent dizziness and lightheadedness w/ standing. No recent fever, chills, nausea, vomiting, or sore throat.   In ED, BP noted to be 80's/40's. Borderline orthostatic. Because of her history of diastolic CHF,, she was  given Lasix 40 mg IV in ED. she did not have any significant improvement.  CXR showed cardiomegaly w/ questionable pulmonary vascular congestion (looks quite  similar to previous CXR in 01/2013). She does have some wheezing. She received a breathing treatment with some mild improvement.  Of note: Last ECHO in  01/2013 showed severe LVH, EF 55-60% w/ moderate/severe dilated LA.   It is also of note that she says that prior to her bypass surgery 3 years ago, she had a cough and shortness of breath.   Inpatient Medications  . furosemide  40 mg Intravenous Once  . ipratropium-albuterol  3 mL Nebulization Once    Family History History reviewed. No pertinent family history.   Social History History   Social History  . Marital Status: Single    Spouse Name: N/A  . Number of Children: N/A  . Years of Education: N/A   Occupational History  . Not on file.   Social History Main Topics  . Smoking status: Never Smoker   . Smokeless tobacco: Not on file  . Alcohol Use: No  . Drug Use: No  . Sexual Activity: Not on file   Other Topics Concern  . Not on file   Social History Narrative  . No narrative on file     Review of Systems  General:  Positive for weight gain. No chills, fever, night sweats.  Cardiovascular:  Positive for DOE, orthopnea, and PND. No chest pain, edema, or palpitations. Dermatological: No rash, lesions/masses Respiratory: Positive for cough and dyspnea.  Urologic: No hematuria, dysuria Abdominal:   No nausea, vomiting, diarrhea, bright red blood per rectum, melena, or hematemesis Neurologic:  No visual changes, wkns, changes  in mental status. All other systems reviewed and are otherwise negative except as noted above.  Physical Exam  Blood pressure 107/41, pulse 73, temperature 97.8 F (36.6 C), temperature source Oral, resp. rate 22, weight 305 lb 4.8 oz (138.483 kg), SpO2 98 %.   General: Morbidly obese female, alert, cooperative, NAD. HEENT: PERRL, EOMI. Moist mucus membranes Neck: Full range of motion without pain, supple, no lymphadenopathy or carotid bruits. No apparent JVD Lungs: Air entry equal  bilaterally. No rales or rhonchi. Scattered wheezes throughout.  Heart: RRR, no murmurs, gallops, or rubs Abdomen: Soft, non-tender, non-distended, BS + Extremities: NO edema. No cyanosis or clubbing. Neurologic: Alert & oriented x3, cranial nerves II-XII intact, strength grossly intact, sensation intact to light touch    Labs  No results for input(s): CKTOTAL, CKMB, TROPONINI in the last 72 hours. Lab Results  Component Value Date   WBC 15.3* 11/10/2014   HGB 15.6* 11/10/2014   HCT 45.2 11/10/2014   MCV 87.4 11/10/2014   PLT 266 11/10/2014    Recent Labs Lab 11/10/14 1023  NA 138  K 3.6  CL 94*  CO2 26  BUN 59*  CREATININE 1.55*  CALCIUM 9.4  PROT 8.0  BILITOT 0.8  ALKPHOS 131*  ALT 17  AST 27  GLUCOSE 101*   BNP: 20.8 (previous records indicate ELEVATED pBNP)  Radiology/Studies  Dg Chest 2 View  11/10/2014   CLINICAL DATA:  59 year old female cough and shortness of Breath. Initial encounter.  EXAM: CHEST  2 VIEW  COMPARISON:  None.  FINDINGS: Mild cardiomegaly. Other mediastinal contours are within normal limits. Sequelae of CABG. No pneumothorax, pleural effusion, consolidation or confluent pulmonary opacity. Increased interstitial markings favor related to pulmonary vasculature. Calcified atherosclerosis of the aorta. Degenerative changes in the thoracic spine. No acute osseous abnormality identified.  IMPRESSION: Mild cardiomegaly and increased interstitial opacity favored due to pulmonary vascular congestion. Prior CABG.   Electronically Signed   By: Odessa FlemingH  Hall M.D.   On: 11/10/2014 11:05   Ct Angio Chest Pe W/cm &/or Wo Cm  11/10/2014   CLINICAL DATA:  Increasing shortness of breath for 1 week very history of CHF. Nonproductive cough.  EXAM: CT ANGIOGRAPHY CHEST, ABDOMEN AND PELVIS  TECHNIQUE: Multidetector CT imaging through the chest, abdomen and pelvis was performed using the standard protocol during bolus administration of intravenous contrast. Multiplanar  reconstructed images and MIPs were obtained and reviewed to evaluate the vascular anatomy.  CONTRAST:  80mL OMNIPAQUE IOHEXOL 350 MG/ML SOLN  COMPARISON:  None.  FINDINGS: CTA CHEST FINDINGS  There are no filling defects in the pulmonary arterial tree to suggest acute pulmonary thromboembolism.  Post CABG. No evidence of aortic dissection. Atherosclerotic calcification of the aortic arch and coronary arteries is noted.  Borderline mediastinal adenopathy. There are several prevascular nodes. 10 mm prevascular node on image 45. Other smaller nodes with fatty hila limbs are present.  No pneumothorax.  No pleural effusion.  Lungs are under aerated. There are scattered ground-glass opacities with an upper lobe distribution likely related to volume loss.  Chronic left rib deformities.  Normal thyroid gland.  Review of the MIP images confirms the above findings.  CTA ABDOMEN AND PELVIS FINDINGS  Aorta is non aneurysmal and patent with scattered atherosclerotic calcifications.  Celiac is grossly patent. Branch vessels are grossly patent. Branch vessels are moderately calcified with atherosclerotic change.  SMA is grossly patent with atherosclerotic plaque at origin and proximal segment. Branch vessels are grossly patent.  IMA is diminutive  and grossly patent.  Two right renal arteries are patent with atherosclerotic changes. There is mild narrowing at the origin of the left renal artery.  Bilateral common, internal, and external iliac arteries are patent with atherosclerotic calcifications.  Atherosclerotic calcification and plaque involves the right common femoral artery. The proximal right superficial femoral artery is severely diseased with severe long segment narrowing. Right profunda femoral artery branches are patent. The visualized portions of the left superficial femoral artery are also markedly diseased. Left common femoral artery is patent.  Liver, spleen, pancreas, adrenal glands are within normal limits.  3 mm  calculus in the lower pole of the right kidney. Left kidney is unremarkable.  Gallbladder is absent  Ventral abdominal hernia contains adipose tissue.  Bladder, uterus, and adnexa are within normal limits.  Advanced degenerative disc disease at L5-S1. Disc osteophytes encroach upon the foramina worse on the right.  No free-fluid.  No abnormal adenopathy in the abdomen.  Review of the MIP images confirms the above findings.  IMPRESSION: No acute vascular pathology. Specifically, no evidence of pulmonary thromboembolism or arterial dissection.  Borderline mediastinal adenopathy likely related to an inflammatory process.  Right nephrolithiasis.  Postoperative changes.   Electronically Signed   By: Jolaine Click M.D.   On: 11/10/2014 13:41   Ct Cta Abd/pel W/cm &/or W/o Cm  11/10/2014   CLINICAL DATA:  Increasing shortness of breath for 1 week very history of CHF. Nonproductive cough.  EXAM: CT ANGIOGRAPHY CHEST, ABDOMEN AND PELVIS  TECHNIQUE: Multidetector CT imaging through the chest, abdomen and pelvis was performed using the standard protocol during bolus administration of intravenous contrast. Multiplanar reconstructed images and MIPs were obtained and reviewed to evaluate the vascular anatomy.  CONTRAST:  80mL OMNIPAQUE IOHEXOL 350 MG/ML SOLN  COMPARISON:  None.  FINDINGS: CTA CHEST FINDINGS  There are no filling defects in the pulmonary arterial tree to suggest acute pulmonary thromboembolism.  Post CABG. No evidence of aortic dissection. Atherosclerotic calcification of the aortic arch and coronary arteries is noted.  Borderline mediastinal adenopathy. There are several prevascular nodes. 10 mm prevascular node on image 45. Other smaller nodes with fatty hila limbs are present.  No pneumothorax.  No pleural effusion.  Lungs are under aerated. There are scattered ground-glass opacities with an upper lobe distribution likely related to volume loss.  Chronic left rib deformities.  Normal thyroid gland.  Review of  the MIP images confirms the above findings.  CTA ABDOMEN AND PELVIS FINDINGS  Aorta is non aneurysmal and patent with scattered atherosclerotic calcifications.  Celiac is grossly patent. Branch vessels are grossly patent. Branch vessels are moderately calcified with atherosclerotic change.  SMA is grossly patent with atherosclerotic plaque at origin and proximal segment. Branch vessels are grossly patent.  IMA is diminutive and grossly patent.  Two right renal arteries are patent with atherosclerotic changes. There is mild narrowing at the origin of the left renal artery.  Bilateral common, internal, and external iliac arteries are patent with atherosclerotic calcifications.  Atherosclerotic calcification and plaque involves the right common femoral artery. The proximal right superficial femoral artery is severely diseased with severe long segment narrowing. Right profunda femoral artery branches are patent. The visualized portions of the left superficial femoral artery are also markedly diseased. Left common femoral artery is patent.  Liver, spleen, pancreas, adrenal glands are within normal limits.  3 mm calculus in the lower pole of the right kidney. Left kidney is unremarkable.  Gallbladder is absent  Ventral abdominal hernia contains  adipose tissue.  Bladder, uterus, and adnexa are within normal limits.  Advanced degenerative disc disease at L5-S1. Disc osteophytes encroach upon the foramina worse on the right.  No free-fluid.  No abnormal adenopathy in the abdomen.  Review of the MIP images confirms the above findings.  IMPRESSION: No acute vascular pathology. Specifically, no evidence of pulmonary thromboembolism or arterial dissection.  Borderline mediastinal adenopathy likely related to an inflammatory process.  Right nephrolithiasis.  Postoperative changes.   Electronically Signed   By: Jolaine Click M.D.   On: 11/10/2014 13:41    ECHO: 02/16/13  Study Conclusions  - Left ventricle: The cavity size  was normal. Wall thickness was increased in a pattern of moderate to severe LVH. Systolic function was normal. The estimated ejection fraction was in the range of 55% to 60%. - Left atrium: The atrium was moderately to severely dilated.  ECG: NSR, LAD, incomplete LBBB?   ASSESSMENT AND PLAN     Please also refer to my complete comments below the signatures below. Also, the patient has 2 medical record numbers. Most of her prior significant information is under a different medical record #[id number]  58 y/o F w/ PMHx of CAD (NSTEMI/CABGx3 in 2012), chronic diastolic CHF, HTN, HLD, and CA stenosis, presented to the ED w/ worsening SOB.   SOB: Patient w/ h/o diastolic CHF, complaining of dry cough, increase in weight, PND and orthopnea. No rales on exam, only scattered wheezes, no apparent JVD or peripheral edema. Borderline orthostasis on exam w/ hypotension. BNP 20.8. Patient also indicates she has had dizziness and lightheadedness w/ standing. Additionally, Cr increased to 1.55 (baseline ~1.00and hemoconcentrated according to CBC. Even though her history is concerning for CHF exacerbation, I would favor acute viral bronchitis rather than CHF picture. Additionally, CTA chest performed, not suggestive of PE, but also did not suggest pulmonary edema, however, suggested ground glass opacities in upper lobes and mild mediastinal lymphadenopathy. Given Lasix 40 mg IV in ED, has been taking 160 po at home. Orthostatic vital signs show BP of 89/47 when lying down and 80/49 when standing. EKG w/ no ischemic changes. -Admit to telemetry  -Continue Duonebs q4h prn -Consult pulmonology for further management of lung pathology -Hold Lasix; give IVF; NS @ 75 cc/hr for 10 hours -Repeat ECHO  -Symptomatic treatment for cough -Cycle troponins -Repeat CBC, BMP in AM  HTN: Hypotensive currently.  -Hold home meds  HLD: Stable.  -Continue statin  DM type II: Takes Insulin + Liraglutide at home. CBG  100 in ED.  -ISS-R + AC/HS  -Repeat HbA1c in AM   Signed, Lars Masson, MD PGY-2, Internal Medicine Pager: [phone] 11/10/2014, 2:54 PM Patient seen and examined. I agree with the assessment and plan as detailed above. See also my additional thoughts below.   The patient presents with a cough and shortness of breath. There is a history of diastolic CHF. However I am not convinced that her symptoms today are from diastolic CHF. 6 days ago she had increased shortness of breath with a cough. It is of note that she has had a cough from an ACE inhibitor in the past. She was switched to an ARB. There is also question that she had a cough from GERD. She was on a PPI in the past but is not on one now. When she first felt poorly 6 days ago she did have some increased edema. I am not sure what to make of her weights. She pushed her diuretic dose up and  she definitely diuresed. She diuresed to the point of no edema which is less than usual for her. Even after she diuresed she continued to have the same shortness of breath and cough. The cough is not productive. This shortness of breath and cough have persisted for several days. Along with this she has had lightheadedness and dizziness. This is probably related to her self induced diuresis at home that did not lead to improvement in her shortness of breath. Along with this she is hypotensive in the emergency room and her creatinine has increased from her baseline. She has wheezes on physical exam. The chest x-ray does not show frank CHF. The CT scan does not show pulmonary emboli. There is question of some mild groundglass appearance of unknown etiology. I feel that as of today the patient is in fact dehydrated. It seems very unlikely that her wheezing is so-called cardiac asthma. There is history in the record of asthma in the past although she is not on asthma medications. Our plan will be to follow her cardiac status including obtaining a repeat 2-D echo. She will  be very carefully rehydrated watching her renal function and her symptoms. We are calling for pulmonary consultation to help with her evaluation. I have chosen not to give her steroids. We will give her breathing treatments. Her presentation is quite unusual. There is no question that she has significant diastolic dysfunction. However I believe that the current symptomatology is not from CHF.    CAD (coronary artery disease)   Hx of CABG     There is a history of coronary disease with CABG 3 years ago. It seems unlikely that her shortness of breath and cough are an anginal equivalent. When the patient presented in 2012 she had marked ST depression and her evaluation led to the need for bypass surgery. Her current EKG reveals changes of left ventricular hypertrophy.     History of asthma     Asthma is listed in her problem list. However I do not have any further information about that at this time.    Obesity   Carotid artery disease    ACE-inhibitor cough   Cough     I have outlined that there is a history of an ACE inhibitor cough. There may have been a cough related to GERD. We will add a PPI. However her current symptoms and wheezing, persisting for several days which seemed to be unusual from just reflux disease.  Chronic diastolic CHF (congestive heart failure)    There is no question that the patient has significant chronic diastolic CHF. However I feel that this is not her presenting symptom today.     LVH (left ventricular hypertrophy)    Shortness of breath      We will evaluate her current shortness of breath with cough as outlined above.     Hypotension   Renal insufficiency   Lightheadedness     The patient has relative hypotension, renal insufficiency, lightheadedness. I believe these are related to the fact that her volume status is depleted. There probably was some volume overload 6 days ago. However this has been treated by her with her increase in diuretics at home and I  believe she is on the dry side now. She will be carefully rehydrated.   Willa Rough, MD, [facility] 11/10/2014 4:40 PM

## 2014-11-10 NOTE — Consult Note (Signed)
Name: Felicia Rivas MRN: [id number] DOB: [birth year]    ADMISSION DATE:  11/10/2014 CONSULTATION DATE:  11/10/2014  REFERRING MD :  Myrtis Ser  CHIEF COMPLAINT:  SOB  BRIEF PATIENT DESCRIPTION: 59 year old female with known diastolic CHF presented to [facility] ED 2/22 complaining of SOB and weight gain. CXR was consistent with vascular congestion and GGO on CT. Wheezes noted on exam. PCCM asked to evaluate.   SIGNIFICANT EVENTS   STUDIES:  2/22 CTA chest > Negative for PE, or vascular abnormality. Some GGO noted in upper lobes.    HISTORY OF PRESENT ILLNESS:  59 y/o female with PMH of CAD (NSTEMI/CABGx3 in 2012), chronic diastolic CHF, HTN, HLD, and CA stenosis, and asthma. She wears home O2 at night since her CABG. 2/16 she began to feel sick with increasing SOB. The week following she noted weight gain, orthopnea, early satiety, non-productive cough, and progressive dyspnea. She took an extra dose of lasix 1/18 but otherwise has not attempted any other treatments. 2/22 she called her cardiologist to tell them how she had been progressing and she was referred to ED. In ED she is noted to be dyspneic and hypotensive with wheezes on exam. CXR consistent with edema and CT showed GGO in upper lobes. PCCM asked to consult.   PAST MEDICAL HISTORY :   has a past medical history of Coronary artery disease; Diabetes mellitus without complication; CHF (congestive heart failure); and Hypertension.  has no past surgical history on file. Prior to Admission medications   Medication Sig Start Date End Date Taking? Authorizing Provider  atorvastatin (LIPITOR) 40 MG tablet Take 40 mg by mouth daily.   Yes Historical Provider, MD  furosemide (LASIX) 40 MG tablet Take 80 mg by mouth 2 (two) times daily.   Yes Historical Provider, MD  insulin regular human CONCENTRATED (HUMULIN R) 500 UNIT/ML SOLN injection Inject 8-16 Units into the skin 3 (three) times daily with meals. 20 units in the morning Mid day dose is sliding  scale ~12 units in the evening Max 65 units daily   Yes Historical Provider, MD  Liraglutide 18 MG/3ML SOPN Inject 1.8 mg into the skin daily.   Yes Historical Provider, MD  losartan (COZAAR) 25 MG tablet Take 25 mg by mouth daily.   Yes Historical Provider, MD  metoprolol succinate (TOPROL-XL) 50 MG 24 hr tablet Take 50 mg by mouth daily. Take with or immediately following a meal.   Yes Historical Provider, MD  potassium chloride SA (K-DUR,KLOR-CON) 20 MEQ tablet Take 20 mEq by mouth 2 (two) times daily.   Yes Historical Provider, MD   Allergies  Allergen Reactions  . Other Hives    Unknown IV antibiotic    FAMILY HISTORY:  family history is not on file. SOCIAL HISTORY:  reports that she has never smoked. She does not have any smokeless tobacco history on file. She reports that she does not drink alcohol or use illicit drugs.  REVIEW OF SYSTEMS:   Bolds are positive  Constitutional: weight loss, gain, night sweats, Fevers, chills, fatigue .  HEENT: headaches, Sore throat, sneezing, nasal congestion, post nasal drip, Difficulty swallowing, Tooth/dental problems, visual complaints visual changes, ear ache CV:  chest pain, radiates: ,Orthopnea, PND, swelling in lower extremities, dizziness, palpitations, syncope.  GI  heartburn, indigestion, abdominal pain, nausea, vomiting, diarrhea, change in bowel habits, loss of appetite, bloody stools.  Resp: cough, productive: , hemoptysis, dyspnea, chest pain, pleuritic.  Skin: rash or itching or icterus GU: dysuria, change in  color of urine, urgency or frequency. flank pain, hematuria  MS: joint pain or swelling. decreased range of motion  Psych: change in mood or affect. depression or anxiety.  Neuro: difficulty with speech, weakness, numbness, ataxia    SUBJECTIVE:   VITAL SIGNS: Temp:  [97.8 F (36.6 C)] 97.8 F (36.6 C) (02/22 1007) Pulse Rate:  [73-91] 73 (02/22 1400) Resp:  [14-22] 22 (02/22 1230) BP: (82-127)/(41-67) 107/41 mmHg  (02/22 1400) SpO2:  [93 %-98 %] 98 % (02/22 1524) Weight:  [138.483 kg (305 lb 4.8 oz)] 138.483 kg (305 lb 4.8 oz) (02/22 1025)  PHYSICAL EXAMINATION: General: morbidly obese female with NAD sitting in chair (breathing is worse in bed) Neuro:  Alert, oriented, non-focal HEENT:  /AT, no JVD noted Cardiovascular:  RRR, no MRG noted Lungs:  Coarse wheeze noted L lung, crackles R base. No distress, speaking full sentences. O2 sats 100% on RA. Abdomen:  Obese, soft, non-tender, non-distended Musculoskeletal:  No acute deformity or ROM limitation Skin:  Grossly intact   Recent Labs Lab 11/10/14 1023  NA 138  K 3.6  CL 94*  CO2 26  BUN 59*  CREATININE 1.55*  GLUCOSE 101*    Recent Labs Lab 11/10/14 1023  HGB 15.6*  HCT 45.2  WBC 15.3*  PLT 266   Dg Chest 2 View  11/10/2014   CLINICAL DATA:  59 year old female cough and shortness of Breath. Initial encounter.  EXAM: CHEST  2 VIEW  COMPARISON:  None.  FINDINGS: Mild cardiomegaly. Other mediastinal contours are within normal limits. Sequelae of CABG. No pneumothorax, pleural effusion, consolidation or confluent pulmonary opacity. Increased interstitial markings favor related to pulmonary vasculature. Calcified atherosclerosis of the aorta. Degenerative changes in the thoracic spine. No acute osseous abnormality identified.  IMPRESSION: Mild cardiomegaly and increased interstitial opacity favored due to pulmonary vascular congestion. Prior CABG.   Electronically Signed   By: Odessa Fleming M.D.   On: 11/10/2014 11:05   Ct Angio Chest Pe W/cm &/or Wo Cm  11/10/2014   CLINICAL DATA:  Increasing shortness of breath for 1 week very history of CHF. Nonproductive cough.  EXAM: CT ANGIOGRAPHY CHEST, ABDOMEN AND PELVIS  TECHNIQUE: Multidetector CT imaging through the chest, abdomen and pelvis was performed using the standard protocol during bolus administration of intravenous contrast. Multiplanar reconstructed images and MIPs were obtained and  reviewed to evaluate the vascular anatomy.  CONTRAST:  80mL OMNIPAQUE IOHEXOL 350 MG/ML SOLN  COMPARISON:  None.  FINDINGS: CTA CHEST FINDINGS  There are no filling defects in the pulmonary arterial tree to suggest acute pulmonary thromboembolism.  Post CABG. No evidence of aortic dissection. Atherosclerotic calcification of the aortic arch and coronary arteries is noted.  Borderline mediastinal adenopathy. There are several prevascular nodes. 10 mm prevascular node on image 45. Other smaller nodes with fatty hila limbs are present.  No pneumothorax.  No pleural effusion.  Lungs are under aerated. There are scattered ground-glass opacities with an upper lobe distribution likely related to volume loss.  Chronic left rib deformities.  Normal thyroid gland.  Review of the MIP images confirms the above findings.  CTA ABDOMEN AND PELVIS FINDINGS  Aorta is non aneurysmal and patent with scattered atherosclerotic calcifications.  Celiac is grossly patent. Branch vessels are grossly patent. Branch vessels are moderately calcified with atherosclerotic change.  SMA is grossly patent with atherosclerotic plaque at origin and proximal segment. Branch vessels are grossly patent.  IMA is diminutive and grossly patent.  Two right renal arteries are patent  with atherosclerotic changes. There is mild narrowing at the origin of the left renal artery.  Bilateral common, internal, and external iliac arteries are patent with atherosclerotic calcifications.  Atherosclerotic calcification and plaque involves the right common femoral artery. The proximal right superficial femoral artery is severely diseased with severe long segment narrowing. Right profunda femoral artery branches are patent. The visualized portions of the left superficial femoral artery are also markedly diseased. Left common femoral artery is patent.  Liver, spleen, pancreas, adrenal glands are within normal limits.  3 mm calculus in the lower pole of the right kidney.  Left kidney is unremarkable.  Gallbladder is absent  Ventral abdominal hernia contains adipose tissue.  Bladder, uterus, and adnexa are within normal limits.  Advanced degenerative disc disease at L5-S1. Disc osteophytes encroach upon the foramina worse on the right.  No free-fluid.  No abnormal adenopathy in the abdomen.  Review of the MIP images confirms the above findings.  IMPRESSION: No acute vascular pathology. Specifically, no evidence of pulmonary thromboembolism or arterial dissection.  Borderline mediastinal adenopathy likely related to an inflammatory process.  Right nephrolithiasis.  Postoperative changes.   Electronically Signed   By: Jolaine ClickArthur  Hoss M.D.   On: 11/10/2014 13:41   Ct Cta Abd/pel W/cm &/or W/o Cm  11/10/2014   CLINICAL DATA:  Increasing shortness of breath for 1 week very history of CHF. Nonproductive cough.  EXAM: CT ANGIOGRAPHY CHEST, ABDOMEN AND PELVIS  TECHNIQUE: Multidetector CT imaging through the chest, abdomen and pelvis was performed using the standard protocol during bolus administration of intravenous contrast. Multiplanar reconstructed images and MIPs were obtained and reviewed to evaluate the vascular anatomy.  CONTRAST:  80mL OMNIPAQUE IOHEXOL 350 MG/ML SOLN  COMPARISON:  None.  FINDINGS: CTA CHEST FINDINGS  There are no filling defects in the pulmonary arterial tree to suggest acute pulmonary thromboembolism.  Post CABG. No evidence of aortic dissection. Atherosclerotic calcification of the aortic arch and coronary arteries is noted.  Borderline mediastinal adenopathy. There are several prevascular nodes. 10 mm prevascular node on image 45. Other smaller nodes with fatty hila limbs are present.  No pneumothorax.  No pleural effusion.  Lungs are under aerated. There are scattered ground-glass opacities with an upper lobe distribution likely related to volume loss.  Chronic left rib deformities.  Normal thyroid gland.  Review of the MIP images confirms the above findings.   CTA ABDOMEN AND PELVIS FINDINGS  Aorta is non aneurysmal and patent with scattered atherosclerotic calcifications.  Celiac is grossly patent. Branch vessels are grossly patent. Branch vessels are moderately calcified with atherosclerotic change.  SMA is grossly patent with atherosclerotic plaque at origin and proximal segment. Branch vessels are grossly patent.  IMA is diminutive and grossly patent.  Two right renal arteries are patent with atherosclerotic changes. There is mild narrowing at the origin of the left renal artery.  Bilateral common, internal, and external iliac arteries are patent with atherosclerotic calcifications.  Atherosclerotic calcification and plaque involves the right common femoral artery. The proximal right superficial femoral artery is severely diseased with severe long segment narrowing. Right profunda femoral artery branches are patent. The visualized portions of the left superficial femoral artery are also markedly diseased. Left common femoral artery is patent.  Liver, spleen, pancreas, adrenal glands are within normal limits.  3 mm calculus in the lower pole of the right kidney. Left kidney is unremarkable.  Gallbladder is absent  Ventral abdominal hernia contains adipose tissue.  Bladder, uterus, and adnexa are within normal  limits.  Advanced degenerative disc disease at L5-S1. Disc osteophytes encroach upon the foramina worse on the right.  No free-fluid.  No abnormal adenopathy in the abdomen.  Review of the MIP images confirms the above findings.  IMPRESSION: No acute vascular pathology. Specifically, no evidence of pulmonary thromboembolism or arterial dissection.  Borderline mediastinal adenopathy likely related to an inflammatory process.  Right nephrolithiasis.  Postoperative changes.   Electronically Signed   By: Jolaine Click M.D.   On: 11/10/2014 13:41    ASSESSMENT / PLAN:  Dyspnea with cough and wheeze. - Her story and evolving sx sound like a two-step process. I  suspect that she was developing CHF sx about 6 days ago, associated with cough (? Cough a cause or effect of of her volume status). She treated her sx appropriately, increased her home lasix, experienced improvement in edema. Her cough persisted and her dyspnea did not improve as she expected. In the ED her S Cr, BNP, CXR support adequate diuresis - in fact she may be dry. Her cough is evolving to a wet cough with a feeling of chest congestion although still not productive. On exam she has a focal left rhonchorous wheeze on inspiration and expiration - not consistent with asthma or pulm edema but instead a focal process like bronchitis or early PNA. Her CT chest was a poor study due to motion and to a poor inspiratory effort, suspect that much of her opacity reflects low volumes but must consider pneumonitis vs PNA.    Possible Hx asthma > she was told this in the distant past in setting of an acute illness. Unclear whether this is a true chronic dx.    Plan: - SpO2 goal > 92% - Add scheduled bronchodilators for the short term to assist with mucous clearance - Treat with levaquin PO for a complete 7 day course - would defer steroids as this is not consistent with typical asthma - would treat with  - Diuresis per cardiology > she is probably euvolemic to dry after increasing her lasix for the last 4 days.  - Follow CXR for evolving infiltrate - Echo pending - Will need full PFT, with outpatient follow up.   Joneen Roach, AGACNP-BC [facility] Pager [phone] or [phone]   Attending Note:  I have examined patient, reviewed labs, studies and notes. I have discussed the case with Henreitta Leber, and I agree with the data and plans as I have amended above.  Levy Pupa, MD, PhD 11/10/2014, 6:16 PM [facility] [phone] or if no answer [phone]

## 2014-11-10 NOTE — ED Notes (Signed)
Pt sitting on side of bed; denies pain at this time; meal tray ordered

## 2014-11-11 ENCOUNTER — Observation Stay (HOSPITAL_COMMUNITY): Payer: BLUE CROSS/BLUE SHIELD

## 2014-11-11 ENCOUNTER — Encounter (HOSPITAL_COMMUNITY): Payer: Self-pay | Admitting: General Practice

## 2014-11-11 ENCOUNTER — Telehealth: Payer: Self-pay | Admitting: Cardiology

## 2014-11-11 DIAGNOSIS — R0600 Dyspnea, unspecified: Secondary | ICD-10-CM

## 2014-11-11 DIAGNOSIS — J810 Acute pulmonary edema: Secondary | ICD-10-CM

## 2014-11-11 DIAGNOSIS — I5033 Acute on chronic diastolic (congestive) heart failure: Secondary | ICD-10-CM

## 2014-11-11 DIAGNOSIS — R062 Wheezing: Secondary | ICD-10-CM

## 2014-11-11 LAB — CBC
HCT: 40.5 % (ref 36.0–46.0)
HEMOGLOBIN: 13.6 g/dL (ref 12.0–15.0)
MCH: 29.5 pg (ref 26.0–34.0)
MCHC: 33.6 g/dL (ref 30.0–36.0)
MCV: 87.9 fL (ref 78.0–100.0)
PLATELETS: 247 10*3/uL (ref 150–400)
RBC: 4.61 MIL/uL (ref 3.87–5.11)
RDW: 13.7 % (ref 11.5–15.5)
WBC: 11 10*3/uL — AB (ref 4.0–10.5)

## 2014-11-11 LAB — BASIC METABOLIC PANEL
ANION GAP: 11 (ref 5–15)
BUN: 53 mg/dL — AB (ref 6–23)
CALCIUM: 9.1 mg/dL (ref 8.4–10.5)
CO2: 28 mmol/L (ref 19–32)
Chloride: 91 mmol/L — ABNORMAL LOW (ref 96–112)
Creatinine, Ser: 1.45 mg/dL — ABNORMAL HIGH (ref 0.50–1.10)
GFR calc Af Amer: 45 mL/min — ABNORMAL LOW (ref >90)
GFR, EST NON AFRICAN AMERICAN: 39 mL/min — AB (ref >90)
GLUCOSE: 451 mg/dL — AB (ref 70–99)
Potassium: 3.8 mmol/L (ref 3.5–5.1)
SODIUM: 130 mmol/L — AB (ref 135–145)

## 2014-11-11 LAB — GLUCOSE, CAPILLARY
GLUCOSE-CAPILLARY: 374 mg/dL — AB (ref 70–99)
Glucose-Capillary: 327 mg/dL — ABNORMAL HIGH (ref 70–99)
Glucose-Capillary: 374 mg/dL — ABNORMAL HIGH (ref 70–99)

## 2014-11-11 LAB — TSH: TSH: 1.447 u[IU]/mL (ref 0.350–4.500)

## 2014-11-11 LAB — TROPONIN I
TROPONIN I: 0.03 ng/mL (ref <0.031)
Troponin I: <0.03 ng/mL (ref <0.031)

## 2014-11-11 MED ORDER — POTASSIUM CHLORIDE CRYS ER 20 MEQ PO TBCR: 20.0000 meq | EXTENDED_RELEASE_TABLET | Freq: Every day | ORAL | Status: DC

## 2014-11-11 MED ORDER — METOPROLOL SUCCINATE ER 50 MG PO TB24: ORAL_TABLET | ORAL | Status: DC

## 2014-11-11 MED ORDER — PERFLUTREN LIPID MICROSPHERE
1.0000 mL | INTRAVENOUS | Status: AC | PRN
  Administered 2014-11-11: 3 mL via INTRAVENOUS
  Filled 2014-11-11: qty 10

## 2014-11-11 MED ORDER — ASPIRIN 81 MG PO CHEW
81.0000 mg | CHEWABLE_TABLET | Freq: Every day | ORAL | Status: DC
  Administered 2014-11-11: 81 mg via ORAL
  Filled 2014-11-11: qty 1

## 2014-11-11 MED ORDER — FUROSEMIDE 40 MG PO TABS: 80.0000 mg | ORAL_TABLET | Freq: Every day | ORAL | Status: DC

## 2014-11-11 MED ORDER — ALBUTEROL SULFATE HFA 108 (90 BASE) MCG/ACT IN AERS
2.0000 | INHALATION_SPRAY | Freq: Four times a day (QID) | RESPIRATORY_TRACT | Status: AC | PRN
Start: 2014-11-11 — End: ?

## 2014-11-11 MED ORDER — IPRATROPIUM-ALBUTEROL 0.5-2.5 (3) MG/3ML IN SOLN
3.0000 mL | RESPIRATORY_TRACT | Status: DC | PRN
Start: 2014-11-11 — End: 2014-11-11

## 2014-11-11 MED ORDER — METOPROLOL SUCCINATE ER 25 MG PO TB24
25.0000 mg | ORAL_TABLET | Freq: Once | ORAL | Status: AC
  Administered 2014-11-11: 25 mg via ORAL
  Filled 2014-11-11 (×3): qty 1

## 2014-11-11 NOTE — Discharge Summary (Signed)
Discharge Summary   Patient ID: Felicia Rivas MRN: [id number], DOB/AGE: April 27, 1956 59 y.o. Admit date: 11/10/2014 D/C date:     11/11/2014  Primary Care Provider: No primary care provider on file. Primary Cardiologist: Dr. Stanford Breed  Primary Discharge Diagnoses:  1. SOB/cough felt due to combination of viral URI or asthmatic bronchitis precipitating mild acute on chronic diastolic CHF 2. Hypotension 3. Acute kidney injury with creatinine of 1.55 4. H/o hypertension 5. CAD s/p CABG 2012 6. Diabetes mellitus 7. Morbid obesity Body mass index is 49.51 kg/(m^2).   Secondary Discharge Diagnoses:  1. H/o TIA vs stroke 2. Carotid artery disease by [place] 08/2013 3. Hyperlipidemia 4. Cough in 2014: Suspect a combination of CHF, GERD and ACE inhibitor-induced cough. 5. H/o sleep study - Had sleep study several years ago, wasn't told she has OSA but told she needs O2 at night  Hospital Course: Felicia Rivas is a 59 y/o F with history of CAD (NSTEMI/CABGx3 in 2012), chronic diastolic CHF, HTN, HL, carotid stenosis, morbid obesity who presented to [facility] with SOB and cough. She remotely had a cough in 2014 felt due to combination of CHF, GERD, and ACEI.   She presented yesterday with the above complaints. She started having a dry cough 1 week ago with worsening SOB, mostly with exertion. At first her edema was more marked than usual. She pushed her diuretic dose up and diuresed significantly. The edema completely disappeared. However her cough and shortness of breath did not improve.. The patient typically takes about 120 mg daily of Lasix (in the form of 35m QAM/436mQPM - supposed to take 8072mID but says she can't take that much and work a normal day). She recently started taking 160m76mily while her SOB was worse. She also admitted to recent dizziness and lightheadedness w/ standing. No recent fever, chills, nausea, vomiting, or sore throat. In ED, BP noted to be 80's/40's with borderline  orthostasis.  Because of her history of diastolic CHF, she wasgiven Lasix 40 mg IV in ED without initial significant improvement. CXR showed cardiomegaly w/ questionable pulmonary vascular congestion (looked quite similar to previous CXR in 01/2013). She also had some wheezing. She received a breathing treatment with mild improvement. Her symptoms were not felt to be convincingly due to diastolic CHF from a cardiac standpoint and she was euvolemic at time of cardiac evaluation. She was admitted for further evaluation and ruled out for MI. BNP was normal at 20.8. WBC 15k, Cr 1.55. Lactic acid was 2.33->1.38. Her Lasix, metoprolol and Cozaar were held due to her hypotension. She underwent CT scan showing no acute PE or dissection; borderline mediastinal adenopathy likely related to an inflammatory process. She was seen by pulmonology who raised question of asthmatic bronchitis versus viral VRI versus pneumonitis. She was treated with scheduled scheduled bronchodilators for the short term to assist with mucous clearance. They did not treat her with steroids as this was not consistent with typical asthma. Levaquin was considered in original note but per later discussions with pulmonology they did not feel she required abx. WBC trended down to 11k. F/u CXR this AM showed no acute CP disease. Pulm recommended outpatient inhalers PRN. 2D echo was stable with EF 60-65%, grade 2 DD, normal wall thickness, no RWMA.   F/u labs show Cr of 1.45 this AM. BP improved to 118/45. Will plan to resume Lasix 80mg50mdaily (was 80mg 43m40mg Q35m Toprol at half dose 25mg da56m(was 50mg dai38m decrease KCl to  once daily (was twice daily) and hold Losartan (was 36m daily). We have given her Toprol here in the hospital and asked her to restart Lasix tomorrow. Will plan early OP f/u on Friday 11/14/14 at which time would recommend f/u CMET given renal insufficiency and also incidentally abnormal alk phos level. Also of note she is  overdue for carotid followup. Last carotids 08/2013: 644-01%RICA, 402-72%LICA, stable, severe RECA stenosis, moderate LECA stenosis, f/u planned 6 months from study but not done. Would consider arranging at f/u appt.  Other issues - Na was 130 today but glucose was 451 thus it corrects to 136 (pseudohyponatremia). The patient is on Victoza at home but not on here due to not on formulary. She also takes a more aggressive SSI at home than she received in the hospital. She reports recent extremely well controlled CBGs at home thus will continue current regimen. She will continue to monitor her CBG at home. Dr. KRon Parkerhas seen and examined the patient today and feels she is stable for discharge.  In accordance with PCCM's note, the patient was also instructed to contact their office for follow-up along with arrangement of PFTs.    Discharge Vitals: Blood pressure 118/45, pulse 77, temperature 97.3 F (36.3 C), temperature source Oral, resp. rate 16, height '5\' 6"'  (1.676 m), weight 306 lb 9.6 oz (139.073 kg), SpO2 97 %.  Labs: Lab Results  Component Value Date   WBC 11.0* 11/11/2014   HGB 13.6 11/11/2014   HCT 40.5 11/11/2014   MCV 87.9 11/11/2014   PLT 247 11/11/2014    Recent Labs Lab 11/10/14 1023 11/11/14 1356  NA 138 130*  K 3.6 3.8  CL 94* 91*  CO2 26 28  BUN 59* 53*  CREATININE 1.55* 1.45*  CALCIUM 9.4 9.1  PROT 8.0  --   BILITOT 0.8  --   ALKPHOS 131*  --   ALT 17  --   AST 27  --   GLUCOSE 101* 451*    Recent Labs  11/10/14 2137 11/11/14 0255 11/11/14 1108  TROPONINI <0.03 <0.03 0.03   No results found for: CHOL, HDL, LDLCALC, TRIG No results found for: DDIMER  Diagnostic Studies/Procedures   X-ray Chest Pa And Lateral  11/11/2014   CLINICAL DATA:  Shortness of breath, cough.  EXAM: CHEST  2 VIEW  COMPARISON:  11/10/2014  FINDINGS: Prior median sternotomy. Heart is normal size. No confluent airspace opacities or effusions. No acute bony abnormality.  IMPRESSION: No  active cardiopulmonary disease.   Electronically Signed   By: Felicia Rivas.   On: 11/11/2014 08:41   Dg Chest 2 View  11/10/2014   CLINICAL DATA:  59year old female cough and shortness of Breath. Initial encounter.  EXAM: CHEST  2 VIEW  COMPARISON:  None.  FINDINGS: Mild cardiomegaly. Other mediastinal contours are within normal limits. Sequelae of CABG. No pneumothorax, pleural effusion, consolidation or confluent pulmonary opacity. Increased interstitial markings favor related to pulmonary vasculature. Calcified atherosclerosis of the aorta. Degenerative changes in the thoracic spine. No acute osseous abnormality identified.  IMPRESSION: Mild cardiomegaly and increased interstitial opacity favored due to pulmonary vascular congestion. Prior CABG.   Electronically Signed   By: HGenevie AnnM.D.   On: 11/10/2014 11:05   Ct Angio Chest Pe W/cm &/or Wo Cm  11/10/2014   CLINICAL DATA:  Increasing shortness of breath for 1 week very history of CHF. Nonproductive cough.  EXAM: CT ANGIOGRAPHY CHEST, ABDOMEN AND PELVIS  TECHNIQUE: Multidetector CT  imaging through the chest, abdomen and pelvis was performed using the standard protocol during bolus administration of intravenous contrast. Multiplanar reconstructed images and MIPs were obtained and reviewed to evaluate the vascular anatomy.  CONTRAST:  23m OMNIPAQUE IOHEXOL 350 MG/ML SOLN  COMPARISON:  None.  FINDINGS: CTA CHEST FINDINGS  There are no filling defects in the pulmonary arterial tree to suggest acute pulmonary thromboembolism.  Post CABG. No evidence of aortic dissection. Atherosclerotic calcification of the aortic arch and coronary arteries is noted.  Borderline mediastinal adenopathy. There are several prevascular nodes. 10 mm prevascular node on image 45. Other smaller nodes with fatty hila limbs are present.  No pneumothorax.  No pleural effusion.  Lungs are under aerated. There are scattered ground-glass opacities with an upper lobe distribution  likely related to volume loss.  Chronic left rib deformities.  Normal thyroid gland.  Review of the MIP images confirms the above findings.  CTA ABDOMEN AND PELVIS FINDINGS  Aorta is non aneurysmal and patent with scattered atherosclerotic calcifications.  Celiac is grossly patent. Branch vessels are grossly patent. Branch vessels are moderately calcified with atherosclerotic change.  SMA is grossly patent with atherosclerotic plaque at origin and proximal segment. Branch vessels are grossly patent.  IMA is diminutive and grossly patent.  Two right renal arteries are patent with atherosclerotic changes. There is mild narrowing at the origin of the left renal artery.  Bilateral common, internal, and external iliac arteries are patent with atherosclerotic calcifications.  Atherosclerotic calcification and plaque involves the right common femoral artery. The proximal right superficial femoral artery is severely diseased with severe long segment narrowing. Right profunda femoral artery branches are patent. The visualized portions of the left superficial femoral artery are also markedly diseased. Left common femoral artery is patent.  Liver, spleen, pancreas, adrenal glands are within normal limits.  3 mm calculus in the lower pole of the right kidney. Left kidney is unremarkable.  Gallbladder is absent  Ventral abdominal hernia contains adipose tissue.  Bladder, uterus, and adnexa are within normal limits.  Advanced degenerative disc disease at L5-S1. Disc osteophytes encroach upon the foramina worse on the right.  No free-fluid.  No abnormal adenopathy in the abdomen.  Review of the MIP images confirms the above findings.  IMPRESSION: No acute vascular pathology. Specifically, no evidence of pulmonary thromboembolism or arterial dissection.  Borderline mediastinal adenopathy likely related to an inflammatory process.  Right nephrolithiasis.  Postoperative changes.   Electronically Signed   By: AMarybelle KillingsM.D.   On:  11/10/2014 13:41   Ct Cta Abd/pel W/cm &/or W/o Cm  11/10/2014   CLINICAL DATA:  Increasing shortness of breath for 1 week very history of CHF. Nonproductive cough.  EXAM: CT ANGIOGRAPHY CHEST, ABDOMEN AND PELVIS  TECHNIQUE: Multidetector CT imaging through the chest, abdomen and pelvis was performed using the standard protocol during bolus administration of intravenous contrast. Multiplanar reconstructed images and MIPs were obtained and reviewed to evaluate the vascular anatomy.  CONTRAST:  834mOMNIPAQUE IOHEXOL 350 MG/ML SOLN  COMPARISON:  None.  FINDINGS: CTA CHEST FINDINGS  There are no filling defects in the pulmonary arterial tree to suggest acute pulmonary thromboembolism.  Post CABG. No evidence of aortic dissection. Atherosclerotic calcification of the aortic arch and coronary arteries is noted.  Borderline mediastinal adenopathy. There are several prevascular nodes. 10 mm prevascular node on image 45. Other smaller nodes with fatty hila limbs are present.  No pneumothorax.  No pleural effusion.  Lungs are under aerated. There  are scattered ground-glass opacities with an upper lobe distribution likely related to volume loss.  Chronic left rib deformities.  Normal thyroid gland.  Review of the MIP images confirms the above findings.  CTA ABDOMEN AND PELVIS FINDINGS  Aorta is non aneurysmal and patent with scattered atherosclerotic calcifications.  Celiac is grossly patent. Branch vessels are grossly patent. Branch vessels are moderately calcified with atherosclerotic change.  SMA is grossly patent with atherosclerotic plaque at origin and proximal segment. Branch vessels are grossly patent.  IMA is diminutive and grossly patent.  Two right renal arteries are patent with atherosclerotic changes. There is mild narrowing at the origin of the left renal artery.  Bilateral common, internal, and external iliac arteries are patent with atherosclerotic calcifications.  Atherosclerotic calcification and plaque  involves the right common femoral artery. The proximal right superficial femoral artery is severely diseased with severe long segment narrowing. Right profunda femoral artery branches are patent. The visualized portions of the left superficial femoral artery are also markedly diseased. Left common femoral artery is patent.  Liver, spleen, pancreas, adrenal glands are within normal limits.  3 mm calculus in the lower pole of the right kidney. Left kidney is unremarkable.  Gallbladder is absent  Ventral abdominal hernia contains adipose tissue.  Bladder, uterus, and adnexa are within normal limits.  Advanced degenerative disc disease at L5-S1. Disc osteophytes encroach upon the foramina worse on the right.  No free-fluid.  No abnormal adenopathy in the abdomen.  Review of the MIP images confirms the above findings.  IMPRESSION: No acute vascular pathology. Specifically, no evidence of pulmonary thromboembolism or arterial dissection.  Borderline mediastinal adenopathy likely related to an inflammatory process.  Right nephrolithiasis.  Postoperative changes.   Electronically Signed   By: Marybelle Killings M.D.   On: 11/10/2014 13:41   2D Echo 11/11/14 - - Left ventricle: The cavity size was normal. Wall thickness was normal. Systolic function was normal. The estimated ejection fraction was in the range of 60% to 65%. Wall motion was normal; there were no regional wall motion abnormalities. Features are consistent with a pseudonormal left ventricular filling pattern, with concomitant abnormal relaxation and increased filling pressure (grade 2 diastolic dysfunction).  Discharge Medications   Current Discharge Medication List    START taking these medications   Details  albuterol (PROVENTIL HFA;VENTOLIN HFA) 108 (90 BASE) MCG/ACT inhaler Inhale 2 puffs into the lungs every 6 (six) hours as needed for wheezing or shortness of breath. Qty: 1 Inhaler, Refills: 1      CONTINUE these medications which  have CHANGED   Details  furosemide (LASIX) 40 MG tablet Take 2 tablets (80 mg total) by mouth daily. Qty: 60 tablet, Refills: 2    metoprolol succinate (TOPROL-XL) 50 MG 24 hr tablet Take 1/2 tablet (1m) by mouth daily. Qty: 30 tablet, Refills: 2    potassium chloride SA (K-DUR,KLOR-CON) 20 MEQ tablet Take 1 tablet (20 mEq total) by mouth daily. Qty: 30 tablet, Refills: 2      CONTINUE these medications which have NOT CHANGED   Details  aspirin 81 MG tablet Take 81 mg by mouth daily.    atorvastatin (LIPITOR) 40 MG tablet Take 40 mg by mouth daily.    insulin regular human CONCENTRATED (HUMULIN R) 500 UNIT/ML SOLN injection Inject 8-16 Units into the skin 3 (three) times daily with meals. 20 units in the morning Mid day dose is sliding scale ~12 units in the evening Max 65 units daily    Liraglutide  18 MG/3ML SOPN Inject 1.8 mg into the skin daily.      STOP taking these medications     losartan (COZAAR) 25 MG tablet         Disposition   The patient will be discharged in stable condition to home. Discharge Instructions    Diet - low sodium heart healthy    Complete by:  As directed      Increase activity slowly    Complete by:  As directed   We have made the following medicine changes: - Decrease Lasix to 72m once a day - Decrease Toprol to 227mdaily (1/2 tablet) - Decrease Potassium to 2081mdaily - Do not take any further Losartan (Cozaar) for now          Follow-up Information    Follow up with KILKerin Ransom PA-C.   Specialty:  Cardiology   Why:  11/14/14 at 8:30am ([place] Contact information:   [phone]    Follow up with BYRCollene GobbleMD.   Specialty:  Pulmonary Disease   Why:  Please call the office to schedule follow-up appointment. Your doctor also wants you to schedule PFTs (pulmonary function tests).   Contact information:   [address] [phone]      Duration of Discharge Encounter: Greater than 30 minutes including physician and PA time.  Signed, DayMelina Copa-C 11/11/2014, 5:08 PM  Patient seen and examined. I agree with the assessment and plan as detailed above. See also my additional thoughts below.   See progress note. I made decision for discharge.  JefDola ArgyleD, FACBaptist Medical Center East24/2016 9:30 AM

## 2014-11-11 NOTE — Progress Notes (Signed)
Inpatient Diabetes Program Recommendations  AACE/ADA: New Consensus Statement on Inpatient Glycemic Control (2013)  Target Ranges:  Prepandial:   less than 140 mg/dL      Peak postprandial:   less than 180 mg/dL (1-2 hours)      Critically ill patients:  140 - 180 mg/dL  Results for Felicia Rivas, Felicia Rivas (MRN [id number]) as of 11/11/2014 08:25  Ref. Range 11/10/2014 21:37 11/11/2014 05:54  Glucose-Capillary Latest Range: 70-99 mg/dL 188239 (H) 416327 (H)    Outpatient Diabetes medications: Humulin R U-500 Current orders for Inpatient glycemic control: Novolog Moderate scale TID+HS Consider adding patient's home dose U-500 or basal Lantus 25 units  Will follow. Thank you  Piedad ClimesGina Dayveon Halley BSN, RN,CDE Inpatient Diabetes Coordinator [phone] (team pager)

## 2014-11-11 NOTE — Consult Note (Signed)
Name: Felicia Rivas MRN: [id number] DOB: 03/15/1956    ADMISSION DATE:  11/10/2014 CONSULTATION DATE:  11/10/2014  REFERRING MD :  Myrtis Ser  CHIEF COMPLAINT:  SOB  BRIEF PATIENT DESCRIPTION:  59 yo female former smoker presented with dyspnea, wt gain, and wheeze.  SIGNIFICANT EVENTS  2/22 Admit  STUDIES:  2/22 CTA chest > Negative for PE, or vascular abnormality. Some GGO noted in upper lobes 2/23 Echo >> EF 60 to 65%, grade 2 diastolic dysfx  SUBJECTIVE:  Breathing better.  Not as much cough. She was not aware of wheezing.  She reports having negative sleep study few yrs ago.  VITAL SIGNS: Temp:  [98 F (36.7 C)-98.2 F (36.8 C)] 98 F (36.7 C) (02/23 0520) Pulse Rate:  [72-84] 79 (02/23 0520) Resp:  [14-22] 16 (02/23 0520) BP: (82-113)/(41-82) 101/59 mmHg (02/23 0520) SpO2:  [93 %-100 %] 96 % (02/23 0520) Weight:  [305 lb 12.8 oz (138.71 kg)-306 lb 9.6 oz (139.073 kg)] 306 lb 9.6 oz (139.073 kg) (02/23 0520)  PHYSICAL EXAMINATION: General: pleasant Neuro: normal strength HEENT: no sinus tenderness Cardiovascular: regular Lungs: faint crackles at bases, no wheeze Abdomen: soft, non tender Musculoskeletal: no edema Skin: no rashes   Recent Labs Lab 11/10/14 1023  NA 138  K 3.6  CL 94*  CO2 26  BUN 59*  CREATININE 1.55*  GLUCOSE 101*    Recent Labs Lab 11/10/14 1023  HGB 15.6*  HCT 45.2  WBC 15.3*  PLT 266   X-ray Chest Pa And Lateral  11/11/2014   CLINICAL DATA:  Shortness of breath, cough.  EXAM: CHEST  2 VIEW  COMPARISON:  11/10/2014  FINDINGS: Prior median sternotomy. Heart is normal size. No confluent airspace opacities or effusions. No acute bony abnormality.  IMPRESSION: No active cardiopulmonary disease.   Electronically Signed   By: Charlett Nose M.D.   On: 11/11/2014 08:41   Dg Chest 2 View  11/10/2014   CLINICAL DATA:  59 year old female cough and shortness of Breath. Initial encounter.  EXAM: CHEST  2 VIEW  COMPARISON:  None.  FINDINGS: Mild  cardiomegaly. Other mediastinal contours are within normal limits. Sequelae of CABG. No pneumothorax, pleural effusion, consolidation or confluent pulmonary opacity. Increased interstitial markings favor related to pulmonary vasculature. Calcified atherosclerosis of the aorta. Degenerative changes in the thoracic spine. No acute osseous abnormality identified.  IMPRESSION: Mild cardiomegaly and increased interstitial opacity favored due to pulmonary vascular congestion. Prior CABG.   Electronically Signed   By: Odessa Fleming M.D.   On: 11/10/2014 11:05   Ct Angio Chest Pe W/cm &/or Wo Cm  11/10/2014   CLINICAL DATA:  Increasing shortness of breath for 1 week very history of CHF. Nonproductive cough.  EXAM: CT ANGIOGRAPHY CHEST, ABDOMEN AND PELVIS  TECHNIQUE: Multidetector CT imaging through the chest, abdomen and pelvis was performed using the standard protocol during bolus administration of intravenous contrast. Multiplanar reconstructed images and MIPs were obtained and reviewed to evaluate the vascular anatomy.  CONTRAST:  80mL OMNIPAQUE IOHEXOL 350 MG/ML SOLN  COMPARISON:  None.  FINDINGS: CTA CHEST FINDINGS  There are no filling defects in the pulmonary arterial tree to suggest acute pulmonary thromboembolism.  Post CABG. No evidence of aortic dissection. Atherosclerotic calcification of the aortic arch and coronary arteries is noted.  Borderline mediastinal adenopathy. There are several prevascular nodes. 10 mm prevascular node on image 45. Other smaller nodes with fatty hila limbs are present.  No pneumothorax.  No pleural effusion.  Lungs are under  aerated. There are scattered ground-glass opacities with an upper lobe distribution likely related to volume loss.  Chronic left rib deformities.  Normal thyroid gland.  Review of the MIP images confirms the above findings.  CTA ABDOMEN AND PELVIS FINDINGS  Aorta is non aneurysmal and patent with scattered atherosclerotic calcifications.  Celiac is grossly patent.  Branch vessels are grossly patent. Branch vessels are moderately calcified with atherosclerotic change.  SMA is grossly patent with atherosclerotic plaque at origin and proximal segment. Branch vessels are grossly patent.  IMA is diminutive and grossly patent.  Two right renal arteries are patent with atherosclerotic changes. There is mild narrowing at the origin of the left renal artery.  Bilateral common, internal, and external iliac arteries are patent with atherosclerotic calcifications.  Atherosclerotic calcification and plaque involves the right common femoral artery. The proximal right superficial femoral artery is severely diseased with severe long segment narrowing. Right profunda femoral artery branches are patent. The visualized portions of the left superficial femoral artery are also markedly diseased. Left common femoral artery is patent.  Liver, spleen, pancreas, adrenal glands are within normal limits.  3 mm calculus in the lower pole of the right kidney. Left kidney is unremarkable.  Gallbladder is absent  Ventral abdominal hernia contains adipose tissue.  Bladder, uterus, and adnexa are within normal limits.  Advanced degenerative disc disease at L5-S1. Disc osteophytes encroach upon the foramina worse on the right.  No free-fluid.  No abnormal adenopathy in the abdomen.  Review of the MIP images confirms the above findings.  IMPRESSION: No acute vascular pathology. Specifically, no evidence of pulmonary thromboembolism or arterial dissection.  Borderline mediastinal adenopathy likely related to an inflammatory process.  Right nephrolithiasis.  Postoperative changes.   Electronically Signed   By: Jolaine ClickArthur  Hoss M.D.   On: 11/10/2014 13:41   Ct Cta Abd/pel W/cm &/or W/o Cm  11/10/2014   CLINICAL DATA:  Increasing shortness of breath for 1 week very history of CHF. Nonproductive cough.  EXAM: CT ANGIOGRAPHY CHEST, ABDOMEN AND PELVIS  TECHNIQUE: Multidetector CT imaging through the chest, abdomen and  pelvis was performed using the standard protocol during bolus administration of intravenous contrast. Multiplanar reconstructed images and MIPs were obtained and reviewed to evaluate the vascular anatomy.  CONTRAST:  80mL OMNIPAQUE IOHEXOL 350 MG/ML SOLN  COMPARISON:  None.  FINDINGS: CTA CHEST FINDINGS  There are no filling defects in the pulmonary arterial tree to suggest acute pulmonary thromboembolism.  Post CABG. No evidence of aortic dissection. Atherosclerotic calcification of the aortic arch and coronary arteries is noted.  Borderline mediastinal adenopathy. There are several prevascular nodes. 10 mm prevascular node on image 45. Other smaller nodes with fatty hila limbs are present.  No pneumothorax.  No pleural effusion.  Lungs are under aerated. There are scattered ground-glass opacities with an upper lobe distribution likely related to volume loss.  Chronic left rib deformities.  Normal thyroid gland.  Review of the MIP images confirms the above findings.  CTA ABDOMEN AND PELVIS FINDINGS  Aorta is non aneurysmal and patent with scattered atherosclerotic calcifications.  Celiac is grossly patent. Branch vessels are grossly patent. Branch vessels are moderately calcified with atherosclerotic change.  SMA is grossly patent with atherosclerotic plaque at origin and proximal segment. Branch vessels are grossly patent.  IMA is diminutive and grossly patent.  Two right renal arteries are patent with atherosclerotic changes. There is mild narrowing at the origin of the left renal artery.  Bilateral common, internal, and external iliac  arteries are patent with atherosclerotic calcifications.  Atherosclerotic calcification and plaque involves the right common femoral artery. The proximal right superficial femoral artery is severely diseased with severe long segment narrowing. Right profunda femoral artery branches are patent. The visualized portions of the left superficial femoral artery are also markedly  diseased. Left common femoral artery is patent.  Liver, spleen, pancreas, adrenal glands are within normal limits.  3 mm calculus in the lower pole of the right kidney. Left kidney is unremarkable.  Gallbladder is absent  Ventral abdominal hernia contains adipose tissue.  Bladder, uterus, and adnexa are within normal limits.  Advanced degenerative disc disease at L5-S1. Disc osteophytes encroach upon the foramina worse on the right.  No free-fluid.  No abnormal adenopathy in the abdomen.  Review of the MIP images confirms the above findings.  IMPRESSION: No acute vascular pathology. Specifically, no evidence of pulmonary thromboembolism or arterial dissection.  Borderline mediastinal adenopathy likely related to an inflammatory process.  Right nephrolithiasis.  Postoperative changes.   Electronically Signed   By: Jolaine Click M.D.   On: 11/10/2014 13:41    ASSESSMENT / PLAN:  Dyspnea likely related to viral URI causing acute on chronic  Diastolic heart failure with pulmonary edema.  Improved clinically and on CXR.  She might also have component of asthmatic bronchitis related to viral URI, but suspect her wheezing was more related to pulmonary edema. Plan: - change nebs to prn - diuresis, BP management per cardiology   She is okay for d/c home from pulmonary standpoint.  PCCM will sign off.  D/c Dr. Myrtis Ser.  Coralyn Helling, MD [facility] Pulmonary/Critical Care 11/11/2014, 11:40 AM Pager:  [phone] After 3pm call: [phone]

## 2014-11-11 NOTE — Progress Notes (Signed)
  Echocardiogram 2D Echocardiogram has been performed.  Felicia Rivas, Staceyann Knouff 11/11/2014, 9:05 AM

## 2014-11-11 NOTE — Progress Notes (Signed)
Patient: Felicia Rivas / Admit Date: 11/10/2014 / Date of Encounter: 11/11/2014, 7:54 AM   Subjective: Feeling better but still with cough/dyspnea with recumbency.  She notes similar presentation prior to CABG.   Objective: Telemetry: NSR Physical Exam: Blood pressure 101/59, pulse 79, temperature 98 F (36.7 C), temperature source Oral, resp. rate 16, height 5\' 6"  (1.676 m), weight 306 lb 9.6 oz (139.073 kg), SpO2 96 %. General: Well developed obese WF in no acute distress. Head: Normocephalic, atraumatic, sclera non-icteric, no xanthomas, nares are without discharge. Neck:  JVP not elevated. Lungs: Coarse BS throughout, diminished at bases. Breathing is unlabored. Heart: RRR S1 S2 without murmurs, rubs, or gallops.  Abdomen: Soft, non-tender, non-distended with normoactive bowel sounds. No rebound/guarding. Extremities: No clubbing or cyanosis. No edema. Distal pedal pulses are 2+ and equal bilaterally. Neuro: Alert and oriented X 3. Moves all extremities spontaneously. Psych:  Responds to questions appropriately with a normal affect.   Intake/Output Summary (Last 24 hours) at 11/11/14 0754 Last data filed at 11/11/14 0600  Gross per 24 hour  Intake   1160 ml  Output      0 ml  Net   1160 ml    Inpatient Medications:  . atorvastatin  40 mg Oral q1800  . enoxaparin (LOVENOX) injection  40 mg Subcutaneous Q24H  . insulin aspart  0-15 Units Subcutaneous TID WC  . insulin aspart  0-5 Units Subcutaneous QHS  . ipratropium-albuterol  3 mL Nebulization QID  . sodium chloride  3 mL Intravenous Q12H   Infusions:  . sodium chloride 75 mL/hr at 11/11/14 0225    Labs:  Recent Labs  11/10/14 1023  NA 138  K 3.6  CL 94*  CO2 26  GLUCOSE 101*  BUN 59*  CREATININE 1.55*  CALCIUM 9.4    Recent Labs  11/10/14 1023  AST 27  ALT 17  ALKPHOS 131*  BILITOT 0.8  PROT 8.0  ALBUMIN 4.2    Recent Labs  11/10/14 1023  WBC 15.3*  NEUTROABS 11.3*  HGB 15.6*  HCT 45.2   MCV 87.4  PLT 266    Recent Labs  11/10/14 2137 11/11/14 0255  TROPONINI <0.03 <0.03   Invalid input(s): POCBNP No results for input(s): HGBA1C in the last 72 hours.   Radiology/Studies:  Dg Chest 2 View  11/10/2014   CLINICAL DATA:  59 year old female cough and shortness of Breath. Initial encounter.  EXAM: CHEST  2 VIEW  COMPARISON:  None.  FINDINGS: Mild cardiomegaly. Other mediastinal contours are within normal limits. Sequelae of CABG. No pneumothorax, pleural effusion, consolidation or confluent pulmonary opacity. Increased interstitial markings favor related to pulmonary vasculature. Calcified atherosclerosis of the aorta. Degenerative changes in the thoracic spine. No acute osseous abnormality identified.  IMPRESSION: Mild cardiomegaly and increased interstitial opacity favored due to pulmonary vascular congestion. Prior CABG.   Electronically Signed   By: Odessa FlemingH  Hall M.D.   On: 11/10/2014 11:05   Ct Angio Chest Pe W/cm &/or Wo Cm  11/10/2014   CLINICAL DATA:  Increasing shortness of breath for 1 week very history of CHF. Nonproductive cough.  EXAM: CT ANGIOGRAPHY CHEST, ABDOMEN AND PELVIS  TECHNIQUE: Multidetector CT imaging through the chest, abdomen and pelvis was performed using the standard protocol during bolus administration of intravenous contrast. Multiplanar reconstructed images and MIPs were obtained and reviewed to evaluate the vascular anatomy.  CONTRAST:  80mL OMNIPAQUE IOHEXOL 350 MG/ML SOLN  COMPARISON:  None.  FINDINGS: CTA CHEST FINDINGS  There are no  filling defects in the pulmonary arterial tree to suggest acute pulmonary thromboembolism.  Post CABG. No evidence of aortic dissection. Atherosclerotic calcification of the aortic arch and coronary arteries is noted.  Borderline mediastinal adenopathy. There are several prevascular nodes. 10 mm prevascular node on image 45. Other smaller nodes with fatty hila limbs are present.  No pneumothorax.  No pleural effusion.  Lungs  are under aerated. There are scattered ground-glass opacities with an upper lobe distribution likely related to volume loss.  Chronic left rib deformities.  Normal thyroid gland.  Review of the MIP images confirms the above findings.  CTA ABDOMEN AND PELVIS FINDINGS  Aorta is non aneurysmal and patent with scattered atherosclerotic calcifications.  Celiac is grossly patent. Branch vessels are grossly patent. Branch vessels are moderately calcified with atherosclerotic change.  SMA is grossly patent with atherosclerotic plaque at origin and proximal segment. Branch vessels are grossly patent.  IMA is diminutive and grossly patent.  Two right renal arteries are patent with atherosclerotic changes. There is mild narrowing at the origin of the left renal artery.  Bilateral common, internal, and external iliac arteries are patent with atherosclerotic calcifications.  Atherosclerotic calcification and plaque involves the right common femoral artery. The proximal right superficial femoral artery is severely diseased with severe long segment narrowing. Right profunda femoral artery branches are patent. The visualized portions of the left superficial femoral artery are also markedly diseased. Left common femoral artery is patent.  Liver, spleen, pancreas, adrenal glands are within normal limits.  3 mm calculus in the lower pole of the right kidney. Left kidney is unremarkable.  Gallbladder is absent  Ventral abdominal hernia contains adipose tissue.  Bladder, uterus, and adnexa are within normal limits.  Advanced degenerative disc disease at L5-S1. Disc osteophytes encroach upon the foramina worse on the right.  No free-fluid.  No abnormal adenopathy in the abdomen.  Review of the MIP images confirms the above findings.  IMPRESSION: No acute vascular pathology. Specifically, no evidence of pulmonary thromboembolism or arterial dissection.  Borderline mediastinal adenopathy likely related to an inflammatory process.  Right  nephrolithiasis.  Postoperative changes.   Electronically Signed   By: Jolaine Click M.D.   On: 11/10/2014 13:41   Ct Cta Abd/pel W/cm &/or W/o Cm  11/10/2014   CLINICAL DATA:  Increasing shortness of breath for 1 week very history of CHF. Nonproductive cough.  EXAM: CT ANGIOGRAPHY CHEST, ABDOMEN AND PELVIS  TECHNIQUE: Multidetector CT imaging through the chest, abdomen and pelvis was performed using the standard protocol during bolus administration of intravenous contrast. Multiplanar reconstructed images and MIPs were obtained and reviewed to evaluate the vascular anatomy.  CONTRAST:  80mL OMNIPAQUE IOHEXOL 350 MG/ML SOLN  COMPARISON:  None.  FINDINGS: CTA CHEST FINDINGS  There are no filling defects in the pulmonary arterial tree to suggest acute pulmonary thromboembolism.  Post CABG. No evidence of aortic dissection. Atherosclerotic calcification of the aortic arch and coronary arteries is noted.  Borderline mediastinal adenopathy. There are several prevascular nodes. 10 mm prevascular node on image 45. Other smaller nodes with fatty hila limbs are present.  No pneumothorax.  No pleural effusion.  Lungs are under aerated. There are scattered ground-glass opacities with an upper lobe distribution likely related to volume loss.  Chronic left rib deformities.  Normal thyroid gland.  Review of the MIP images confirms the above findings.  CTA ABDOMEN AND PELVIS FINDINGS  Aorta is non aneurysmal and patent with scattered atherosclerotic calcifications.  Celiac is grossly patent.  Branch vessels are grossly patent. Branch vessels are moderately calcified with atherosclerotic change.  SMA is grossly patent with atherosclerotic plaque at origin and proximal segment. Branch vessels are grossly patent.  IMA is diminutive and grossly patent.  Two right renal arteries are patent with atherosclerotic changes. There is mild narrowing at the origin of the left renal artery.  Bilateral common, internal, and external iliac  arteries are patent with atherosclerotic calcifications.  Atherosclerotic calcification and plaque involves the right common femoral artery. The proximal right superficial femoral artery is severely diseased with severe long segment narrowing. Right profunda femoral artery branches are patent. The visualized portions of the left superficial femoral artery are also markedly diseased. Left common femoral artery is patent.  Liver, spleen, pancreas, adrenal glands are within normal limits.  3 mm calculus in the lower pole of the right kidney. Left kidney is unremarkable.  Gallbladder is absent  Ventral abdominal hernia contains adipose tissue.  Bladder, uterus, and adnexa are within normal limits.  Advanced degenerative disc disease at L5-S1. Disc osteophytes encroach upon the foramina worse on the right.  No free-fluid.  No abnormal adenopathy in the abdomen.  Review of the MIP images confirms the above findings.  IMPRESSION: No acute vascular pathology. Specifically, no evidence of pulmonary thromboembolism or arterial dissection.  Borderline mediastinal adenopathy likely related to an inflammatory process.  Right nephrolithiasis.  Postoperative changes.   Electronically Signed   By: Jolaine Click M.D.   On: 11/10/2014 13:41     Assessment and Plan   1. SOB/cough/wheeze favored to be more pulmonary in etiology, along with leukocytosis - question early bronchitis versus PNA versus pneumonitis - CTA neg for PE; did show borderline mediastinal adenopathy likely related to inflammatory process, scattered ground glass opacities - pulm recommended scheduled BDs, defer steroids and outpatient PFT - pulm recommended Levaquin x 7 days but has not ordered - await their input regarding this - f/u CXR unremarkable - will d/w MD whether updated ischemic workup is necessary given that she reports sx are similar to prior to CABG  2. Acute renal insufficiency - most recent prior Cr 0.8 in 12/2013 - BMET pending this AM -  have asked nursing to contact lab to make sure it gets drawn since it was ordered for 5am  3. Acute on chronic diastolic CHF, but less likely to be the cause of #1 - agree with holding further diuretics until more information is known about her renal function - await echocardiogram  4. Hypotension with h/o HTN - antihypertensives remain on hold due to this  5. CAD s/p CABG 2012 - continue statin - BB held due to hypotension - will resume home aspirin (clarified she does take this every day)  6. Diabetes mellitus with labile CBGs - 101->327 - await A1C - liraglutide d/c'd by pharmacy due to not being available - continue SSI for now  7. Morbid obesity Body mass index is 49.51 kg/(m^2).   Signed, Ronie Spies PA-C Patient seen and examined. I agree with the assessment and plan as detailed above. See also my additional thoughts below.   Two-dimensional echo reveals good left ventricular function. Appreciate all the help from the pulmonary team. I feel she does not need an ischemic workup during this hospitalization. We will await her chemistry lab from this morning. If it is stabilizing she will probably be able to go home with careful resumption of her diuretics. We will check with pulmonary to see if she should go home on  Levaquin.  Willa Rough, MD, [facility] 11/11/2014 12:39 PM

## 2014-11-11 NOTE — Telephone Encounter (Signed)
New message    TCM on 2.26.2016 @ 8:30 am per Janie Morningayan Dunn

## 2014-11-11 NOTE — Progress Notes (Signed)
UR completed 

## 2014-11-12 LAB — HEMOGLOBIN A1C
HEMOGLOBIN A1C: 8 % — AB (ref 4.8–5.6)
Mean Plasma Glucose: 183 mg/dL

## 2014-11-12 NOTE — Telephone Encounter (Signed)
[facility].  Patient understands to follow up with provider Corine ShelterLuke Kilroy, PA on November 14, 2014 [facility] Patient understands discharge instructions? yes Patient understands medications and regiment? yes Patient understands to bring all medications to this visit? yes

## 2014-11-13 ENCOUNTER — Telehealth: Payer: Self-pay | Admitting: Physician Assistant

## 2014-11-13 LAB — URINE CULTURE: Colony Count: >=100000

## 2014-11-13 NOTE — Telephone Encounter (Signed)
Please call the patient.  Dr. Myrtis Rivas received the finalized report of her urine culture today. Her urinalysis drawn in the ER during recent admission showed a possible E-Coli UTI (this was under her 2nd MRN [id number]). It did not appear during the admission that she had any urinary symptoms. Her white blood cell count was elevated on admission but came down without intervention. Please ask patient to f/u with her PCP for a recheck of this. Shanaia Sievers PA-C

## 2014-11-13 NOTE — Telephone Encounter (Signed)
In patient's 2nd MRN, UA/UCx was addressed. Please see [id number]. Dayna Dunn PA-C

## 2014-11-14 ENCOUNTER — Ambulatory Visit (INDEPENDENT_AMBULATORY_CARE_PROVIDER_SITE_OTHER): Payer: BLUE CROSS/BLUE SHIELD | Admitting: Cardiology

## 2014-11-14 ENCOUNTER — Encounter: Payer: Self-pay | Admitting: Cardiology

## 2014-11-14 VITALS — BP 112/66 | HR 85 | Ht 63.0 in | Wt 314.8 lb

## 2014-11-14 DIAGNOSIS — I5032 Chronic diastolic (congestive) heart failure: Secondary | ICD-10-CM

## 2014-11-14 DIAGNOSIS — I6529 Occlusion and stenosis of unspecified carotid artery: Secondary | ICD-10-CM

## 2014-11-14 DIAGNOSIS — R0600 Dyspnea, unspecified: Secondary | ICD-10-CM

## 2014-11-14 DIAGNOSIS — I10 Essential (primary) hypertension: Secondary | ICD-10-CM

## 2014-11-14 DIAGNOSIS — E785 Hyperlipidemia, unspecified: Secondary | ICD-10-CM

## 2014-11-14 DIAGNOSIS — I5033 Acute on chronic diastolic (congestive) heart failure: Secondary | ICD-10-CM | POA: Insufficient documentation

## 2014-11-14 DIAGNOSIS — I25810 Atherosclerosis of coronary artery bypass graft(s) without angina pectoris: Secondary | ICD-10-CM

## 2014-11-14 LAB — BRAIN NATRIURETIC PEPTIDE: Pro B Natriuretic peptide (BNP): 54 pg/mL (ref 0.0–100.0)

## 2014-11-14 LAB — COMPREHENSIVE METABOLIC PANEL
ALT: 17 U/L (ref 0–35)
AST: 19 U/L (ref 0–37)
Albumin: 4 g/dL (ref 3.5–5.2)
Alkaline Phosphatase: 133 U/L — ABNORMAL HIGH (ref 39–117)
BUN: 25 mg/dL — ABNORMAL HIGH (ref 6–23)
CO2: 34 mEq/L — ABNORMAL HIGH (ref 19–32)
Calcium: 10 mg/dL (ref 8.4–10.5)
Chloride: 102 mEq/L (ref 96–112)
Creatinine, Ser: 0.92 mg/dL (ref 0.40–1.20)
GFR: 66.24 mL/min (ref >60.00)
Glucose, Bld: 50 mg/dL — ABNORMAL LOW (ref 70–99)
Potassium: 4 mEq/L (ref 3.5–5.1)
Sodium: 139 mEq/L (ref 135–145)
Total Bilirubin: 0.4 mg/dL (ref 0.2–1.2)
Total Protein: 7.4 g/dL (ref 6.0–8.3)

## 2014-11-14 MED ORDER — FUROSEMIDE 80 MG PO TABS: ORAL_TABLET | ORAL | Status: DC

## 2014-11-14 MED ORDER — PANTOPRAZOLE SODIUM 40 MG PO TBEC: 40.0000 mg | DELAYED_RELEASE_TABLET | Freq: Every day | ORAL | Status: DC

## 2014-11-14 NOTE — Assessment & Plan Note (Signed)
She is on statin Rx

## 2014-11-14 NOTE — Assessment & Plan Note (Signed)
Wgt 314, BMI 49

## 2014-11-14 NOTE — Patient Instructions (Addendum)
Your physician has recommended you make the following change in your medication:  1) INCREASE Lasix to 80mg  Twice daily for 2 days. Then take Lasix 80mg  in the morning and 40mg  in the afternoon. An Rx has been sent to your pharmacy. 2) START Protonix 40mg  daily. An Rx has been sent to your pharmacy 3)Fill your prescription for Proventil and use as directed  Lab Today: Bnp, Cmet  Your physician has requested that you have a carotid duplex. This test is an ultrasound of the carotid arteries in your neck. It looks at blood flow through these arteries that supply the brain with blood. Allow one hour for this exam. There are no restrictions or special instructions.  Your physician recommends that you schedule a follow-up appointment in: 4 weeks with Dr.Crenshaw

## 2014-11-14 NOTE — Assessment & Plan Note (Signed)
IDDM, she says she no longer takes Lantus

## 2014-11-14 NOTE — Assessment & Plan Note (Signed)
No chest pain

## 2014-11-14 NOTE — Telephone Encounter (Signed)
lmptcb per Ronie Spiesayna Dunn, PA and Dr. Myrtis SerKatz to have pt f/u w/PCP in regards tio UTI on lab results while in hospital.

## 2014-11-14 NOTE — Assessment & Plan Note (Signed)
Controlled.  

## 2014-11-14 NOTE — Assessment & Plan Note (Signed)
[facility] recent discharge 11/11/14 for acute on chronic respiratory failure secondary to diastolic CHF and suspected viral pneumonitis.

## 2014-11-14 NOTE — Progress Notes (Signed)
11/14/2014 Felicia Rivas   [birth year]  [id number]  Primary Physician No PCP Per Patient Primary Cardiologist: Dr Jens Som  HPI:  58 y/o F with history of CAD (NSTEMI/CABGx3 in 2012), chronic diastolic CHF, HTN, HL, carotid stenosis, morbid obesity who presented to [facility] 11/10/14- 11/11/14 with SOB and cough felt to be secondary to acute on chronic diastolic CHF. She has two medical record numbers which makes it confusing to review her history. The pt herself is not a great historian. She didn't bring her medications to this visit and when I reviewed them with her it was clear she was not taking her medications as instructed at discharge. She is only taking Lasix 80 mg daily. She does not take Lantus. She apparently was on Lisinopril at home and has never heard of Losartan. She never takes metolazone secondary to bladder spasms.           When she was admitted her wgt was around 325. At discharge she was 305, her baseline wgt. Today she is 314. She still c/o of DOE which is new for her. She still has a dry cough.   Current Outpatient Prescriptions  Medication Sig Dispense Refill  . albuterol (PROVENTIL HFA;VENTOLIN HFA) 108 (90 BASE) MCG/ACT inhaler Inhale 1 puff into the lungs every 4 (four) hours as needed for shortness of breath.    [name] aspirin EC 325 MG EC tablet Take 1 tablet (325 mg total) by mouth daily. 30 tablet   . atorvastatin (LIPITOR) 40 MG tablet Take 1 tablet (40 mg total) by mouth daily. 30 tablet 3  . HUMULIN R 500 UNIT/ML SOLN injection Inject 12 Units into the skin 3 (three) times daily with meals. Patient injects into skin 12 units in the morning, 8-12 units at lunch and 12 units at bedtime.    [name] ibuprofen (ADVIL,MOTRIN) 200 MG tablet Take 800 mg by mouth 2 (two) times daily as needed. For pain    . losartan (COZAAR) 25 MG tablet Take 1 tablet (25 mg total) by mouth daily. 30 tablet 1  . metolazone (ZAROXOLYN) 2.5 MG tablet Take 2.5 mg by mouth as needed. 30 minutes  before morning lasix dose;take only on  Tues, thurs, saturdays when has extreme swelling    . metoprolol succinate (TOPROL-XL) 50 MG 24 hr tablet Take 1 tablet (50 mg total) by mouth daily. 30 tablet 6  . potassium chloride SA (K-DUR,KLOR-CON) 20 MEQ tablet Take 1 tablet (20 mEq total) by mouth 2 (two) times daily. TAKE AN EXTRA 40 MEQ TWICE DAILY ON METOLAZONE DAYS 60 tablet 4  . VICTOZA 18 MG/3ML SOPN Inject 1.8 mLs into the skin every morning.     . furosemide (LASIX) 80 MG tablet Take 1 Tablet ( ) in the a.m. And 1/2 Tablet ( ) in the p.m. 45 tablet 11   No current facility-administered medications for this visit.    Allergies  Allergen Reactions  . Lisinopril Cough    ?Cough - changed to ARB 02/2013.    History   Social History  . Marital Status: Married    Spouse Name: N/A  . Number of Children: 3  . Years of Education: 16   Occupational History  . Teacher    Social History Main Topics  . Smoking status: Former Smoker -- 0.50 packs/day for 25 years    Types: Cigarettes  . Smokeless tobacco: Former Neurosurgeon    Quit date: 09/19/2012  . Alcohol Use: No  . Drug Use: No  .  Sexual Activity: No   Other Topics Concern  . Not on file   Social History Narrative   Regular exercise-yes     Review of Systems: General: negative for chills, fever, night sweats or weight changes.  Cardiovascular: negative for chest pain, dyspnea on exertion, edema, orthopnea, palpitations, paroxysmal nocturnal dyspnea or shortness of breath Dermatological: negative for rash Urologic: negative for hematuria Abdominal: negative for nausea, vomiting, diarrhea, bright red blood per rectum, melena, or hematemesis Neurologic: negative for visual changes, syncope, or dizziness All other systems reviewed and are otherwise negative except as noted above.    Blood pressure 112/66, pulse 85, height 5\' 3"  (1.6 m), weight 314 lb 12.8 oz (142.792 kg), SpO2 99 %.  General appearance: alert, cooperative,  no distress and morbidly obese Neck: no carotid bruit and no JVD Lungs: scattered rhonchi and wheezing on Rt Heart: regular rate and rhythm Extremities: 1+ pre tibial pitting edema  EKG NSR, IVCD, NSST, LVH  ASSESSMENT AND PLAN:   Acute on chronic diastolic congestive heart failure Here for TOC f/u office visit after recent discharge 11/11/14 for acute on chronic respiratory failure secondary to diastolic CHF and suspected viral pneumonitis.   Hx of CABG 2012 No chest pain   Carotid stenosis Carotid US (08/2013):  R 60-79%; L 40-59%;  Will schedule f/u   Morbid obesity Wgt 314, BMI 49   DM2 (diabetes mellitus, type 2) IDDM, she says she no longer takes Lantus   Hypertension Controlled   HLD (hyperlipidemia) She is on statin Rx    PLAN  We reviewed medications in detail. She was encouraged to fill her Rx for Proventil inhaler. I increased her Lasix to 80 mg BID x 2 days, then 80/40. We'll check a CNMET and BNP. I scheduled her for f/u carotid dopplers and added a PPI. At discharge from the office the pt became upset, she says before her MI and CABG her only symptoms was cough. She is concerned she has developed progression in her CAD which is certainly possible. I ordered a two day Myoview to be done as well.  Ayven Glasco KPA-C 11/14/2014 9:30 AM

## 2014-11-14 NOTE — Assessment & Plan Note (Signed)
Carotid US (08/2013):  R 60-79%; L 40-59%;  Will schedule f/u

## 2014-11-17 NOTE — Telephone Encounter (Signed)
reached pt on her cell today and she has been advised per Ronie Spiesayna Dunn, PA and Dr. Myrtis SerKatz to f/u w/PCP in regards to UTI. Pt said ok and thank you for the call.

## 2014-11-17 NOTE — Telephone Encounter (Signed)
reached pt on her cell today and she has been advised per Dayna Dunn, PA and Dr. Katz to f/u w/PCP in regards to UTI. Pt said ok and thank you for the call.  

## 2014-11-21 ENCOUNTER — Telehealth: Payer: Self-pay | Admitting: Cardiology

## 2014-11-21 DIAGNOSIS — Z79899 Other long term (current) drug therapy: Secondary | ICD-10-CM

## 2014-11-21 NOTE — Telephone Encounter (Signed)
Called pt back, no answer, no voicemail set up.  Will try back later.

## 2014-11-21 NOTE — Telephone Encounter (Signed)
Herbert SetaHeather called and states that her mother is extremely short of breath and feels weak.  She has been in the hospital recently and [facility].  Herbert SetaHeather states that the PA "blew her off" and told her her asthma was causing her problem.  Please call.

## 2014-11-21 NOTE — Telephone Encounter (Signed)
Advised pt on Dr. Ludwig Clarksrenshaw's recommendations. She plans to go for labs next Friday. Regarding f/u OV - Pt has appt w/ Crenshaw April 5th and wants to wait until then to be seen.  I recommended pt call if any changes or concerns until then, and that we would advise any med adjustments, etc, following labwork and/or status of symptoms. She voiced understanding.

## 2014-11-21 NOTE — Telephone Encounter (Signed)
Agree; bmet one week; fu pa ov Felicia MillersBrian Krystale Rinkenberger

## 2014-11-21 NOTE — Telephone Encounter (Signed)
Spoke to patient. She is not SOB on phone, does state some general weakness but nothing to be alarmed about. She states daughter has been worried because patient continues to get short of breath w/ activity.   Problematic is that her coughing has been persistent since leaving the hospital. This is an unproductive cough that is generally worse positionally (she sleeps upright to alleviate symptoms).  Examining her med list, appears we attempted to address this by increasing her lasix dose. Asked patient about this, and she is only taking 40mg  BID currently. Advised to increase does to 80mg  AM & 40mg  PM & see if this helps improve symptoms.   Will route to Dr. Jens Somrenshaw for additional advice.

## 2014-11-26 ENCOUNTER — Encounter (HOSPITAL_COMMUNITY): Payer: BLUE CROSS/BLUE SHIELD

## 2014-12-04 ENCOUNTER — Encounter (HOSPITAL_COMMUNITY): Payer: BC Managed Care – PPO

## 2014-12-04 ENCOUNTER — Ambulatory Visit (HOSPITAL_COMMUNITY): Payer: BC Managed Care – PPO | Attending: Internal Medicine | Admitting: Radiology

## 2014-12-04 VITALS — BP 104/58 | Ht 63.0 in | Wt 312.0 lb

## 2014-12-04 DIAGNOSIS — R0602 Shortness of breath: Secondary | ICD-10-CM | POA: Insufficient documentation

## 2014-12-04 DIAGNOSIS — I25810 Atherosclerosis of coronary artery bypass graft(s) without angina pectoris: Secondary | ICD-10-CM

## 2014-12-04 MED ORDER — TECHNETIUM TC 99M SESTAMIBI GENERIC - CARDIOLITE
33.0000 | Freq: Once | INTRAVENOUS | Status: AC | PRN
  Administered 2014-12-04: 33 via INTRAVENOUS

## 2014-12-04 MED ORDER — REGADENOSON 0.4 MG/5ML IV SOLN
0.4000 mg | Freq: Once | INTRAVENOUS | Status: AC
  Administered 2014-12-04: 0.4 mg via INTRAVENOUS

## 2014-12-04 MED ORDER — AMINOPHYLLINE 25 MG/ML IV SOLN
75.0000 mg | Freq: Once | INTRAVENOUS | Status: AC
  Administered 2014-12-04: 75 mg via INTRAVENOUS

## 2014-12-04 NOTE — Progress Notes (Signed)
[facility] SITE 3 NUCLEAR MED [address] [place], [place] [id number] [phone]    Cardiology Nuclear Med Study  Felicia BeckmannSharon Careaga is a 59 y.o. female     MRN : [id number]     DOB: 07/25/1956  Procedure Date: 12/04/2014  Nuclear Med Background Indication for Stress Test:  Evaluation for Ischemia and Follow up CAD History:  CAD, MPI, CHF Cardiac Risk Factors: Carotid Disease, Hypertension and IDDM Type 2  Symptoms:  Fatigue   Nuclear Pre-Procedure Caffeine/Decaff Intake:  None NPO After: 6pm   Lungs:  clear O2 Sat: 97% on room air. IV 0.9% NS with Angio Cath:  22g  IV Site: R Forearm  IV Started by:  Bonnita LevanJackie Smith, RN  Chest Size (in):  50 Cup Size: C  Height: 5\' 3"  (1.6 m)  Weight:  312 lb (141.522 kg)  BMI:  Body mass index is 55.28 kg/(m^2). Tech Comments:  Aminophylline 75 mg given for symptoms. All were resolved before leaving. Norris CrossS. Williams EMTP    Nuclear Med Study 1 or 2 day study: 2 day  Stress Test Type:  Eugenie BirksLexiscan  Reading MD: N/A  Order Authorizing Provider:  Olga MillersBrian Harlyn Rathmann, MD  Resting Radionuclide: Technetium 6859m Sestamibi  Resting Radionuclide Dose: 33.0 mCi on 12/12/14   Stress Radionuclide:  Technetium 5859m Sestamibi  Stress Radionuclide Dose: 33.0 mCi on 12/04/14           Stress Protocol Rest HR: 75 Stress HR: 86  Rest BP: 104/58 Stress BP: 141/51  Exercise Time (min): n/a METS: n/a   Predicted Max HR: 161 bpm % Max HR: 53.42 bpm Rate Pressure Product: [id number]   Dose of Adenosine (mg):  n/a Dose of Lexiscan: 0.4 mg  Dose of Atropine (mg): n/a Dose of Dobutamine: n/a mcg/kg/min (at max HR)  Stress Test Technologist: Milana NaSabrina Williams, EMT-P  Nuclear Technologist:  Kerby NoraElzbieta Kubak, CNMT     Rest Procedure:  Myocardial perfusion imaging was performed at rest 45 minutes following the intravenous administration of Technetium 8159m Sestamibi. Rest ECG: SR  73  Anteroseptal MI  ST T wave changes consistent with strain pattern    Stress Procedure:  The  patient received IV Lexiscan 0.4 mg over 15-seconds.  Technetium 8459m Sestamibi injected at 30-seconds. This patient was sob with the Lexiscan injection. Quantitative spect images were obtained after a 45 minute delay. Stress ECG: No significant change from the baseline images    QPS Raw Data Images: Soft tissue (diaphragm, breast, bowel activity) surround heart   Stress Images:  There is decreased uptake in the anterior wall and inferior basal wall. Rest Images:  Normal perfusion. Subtraction (SDS):  These findings are consistent with ischemia. Transient Ischemic Dilatation (Normal <1.22):  1.01 Lung/Heart Ratio (Normal <0.45):  0.29  Quantitative Gated Spect Images QGS EDV:  152 ml QGS ESV:  71 ml  Impression Exercise Capacity:  Lexiscan with no exercise. BP Response:  Normal blood pressure response. Clinical Symptoms:  No chest pain. ECG Impression: Electrically negative for ischemia   Comparison with Prior Nuclear Study: No prior study to compare    Overall Impression:  Intermediate risk stress nuclear study with moderate size, medium intensity, reversible defects in the anterior wall and inferior basal wall consistent with ischemia.  LV Ejection Fraction: 53%.  LV Wall Motion:  NL LV Function; NL Wall Motion  Olga MillersBrian Shreyas Piatkowski

## 2014-12-05 ENCOUNTER — Telehealth: Payer: Self-pay | Admitting: Cardiology

## 2014-12-08 NOTE — Telephone Encounter (Signed)
Close encounter 

## 2014-12-12 ENCOUNTER — Other Ambulatory Visit (INDEPENDENT_AMBULATORY_CARE_PROVIDER_SITE_OTHER): Payer: BC Managed Care – PPO | Admitting: *Deleted

## 2014-12-12 ENCOUNTER — Ambulatory Visit (HOSPITAL_COMMUNITY): Payer: BC Managed Care – PPO | Attending: Cardiology

## 2014-12-12 ENCOUNTER — Other Ambulatory Visit: Payer: Self-pay | Admitting: *Deleted

## 2014-12-12 DIAGNOSIS — I10 Essential (primary) hypertension: Secondary | ICD-10-CM | POA: Diagnosis not present

## 2014-12-12 DIAGNOSIS — Z79899 Other long term (current) drug therapy: Secondary | ICD-10-CM

## 2014-12-12 DIAGNOSIS — R0989 Other specified symptoms and signs involving the circulatory and respiratory systems: Secondary | ICD-10-CM

## 2014-12-12 LAB — BASIC METABOLIC PANEL
BUN: 18 mg/dL (ref 6–23)
CO2: 26 mEq/L (ref 19–32)
CREATININE: 0.82 mg/dL (ref 0.50–1.10)
Calcium: 8.4 mg/dL (ref 8.4–10.5)
Chloride: 103 mEq/L (ref 96–112)
GLUCOSE: 316 mg/dL — AB (ref 70–99)
Potassium: 4.4 mEq/L (ref 3.5–5.3)
Sodium: 136 mEq/L (ref 135–145)

## 2014-12-12 MED ORDER — TECHNETIUM TC 99M SESTAMIBI GENERIC - CARDIOLITE
33.0000 | Freq: Once | INTRAVENOUS | Status: AC | PRN
  Administered 2014-12-12: 33 via INTRAVENOUS

## 2014-12-12 NOTE — Addendum Note (Signed)
Addended by: Tonita PhoenixBOWDEN, Field Staniszewski K on: 12/12/2014 01:42 PM   Modules accepted: Orders

## 2014-12-15 ENCOUNTER — Encounter (HOSPITAL_COMMUNITY): Payer: BLUE CROSS/BLUE SHIELD

## 2014-12-16 ENCOUNTER — Ambulatory Visit (HOSPITAL_COMMUNITY): Payer: BC Managed Care – PPO

## 2014-12-22 NOTE — Progress Notes (Signed)
HPI: FU CAD and diastolic CHF. She is status post CABG in 08/2011.Carotid Dopplers December 2014 showed 60-79% right and 40-59% left stenosis. Admitted in February 2016 with acute on chronic diastolic congestive heart failure. Echocardiogram February 2016 showed normal LV function and grade 2 diastolic dysfunction. Nuclear study March 2016 showed ejection fraction 53%. There were reversible defects in the anterior and inferior basal wall. At last office visit her Lasix was increased. Since she was last seen, she describes dyspnea on exertion as well as cough. She also notes the symptoms with lying flat. She has worsening pedal edema and weight gain. No fevers, chills, productive cough or hemoptysis.  Current Outpatient Prescriptions  Medication Sig Dispense Refill  . albuterol (PROVENTIL HFA;VENTOLIN HFA) 108 (90 BASE) MCG/ACT inhaler Inhale 1 puff into the lungs every 4 (four) hours as needed for shortness of breath.    [name] aspirin EC 325 MG EC tablet Take 1 tablet (325 mg total) by mouth daily. 30 tablet   . atorvastatin (LIPITOR) 40 MG tablet Take 1 tablet (40 mg total) by mouth daily. 30 tablet 3  . furosemide (LASIX) 80 MG tablet Take 1 Tablet ( ) in the a.m. And 1/2 Tablet ( ) in the p.m. 45 tablet 11  . HUMULIN R 500 UNIT/ML SOLN injection Inject 12 Units into the skin 3 (three) times daily with meals. Patient injects into skin 12 units in the morning, 8-12 units at lunch and 12 units at bedtime.    . metoprolol succinate (TOPROL-XL) 25 MG 24 hr tablet Take 25 mg by mouth daily.    . pantoprazole (PROTONIX) 40 MG tablet Take 1 tablet (40 mg total) by mouth daily. 30 tablet 11  . potassium chloride SA (K-DUR,KLOR-CON) 20 MEQ tablet Take 40 mEq by mouth daily.    [name] VICTOZA 18 MG/3ML SOPN Inject 1.8 mg as directed daily.  4   No current facility-administered medications for this visit.     Past Medical History  Diagnosis Date  . Asthma   . Diabetes mellitus   . Sleep  disturbance     Had sleep study several years ago, wasn't told she has OSA but told she needs O2 at night  . CAD (coronary artery disease)     a. NSTEMI 12/12 -> s/p CABGx3.   . 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 Laneta Simmers;   LIMA-LAD, SVG-OM 2, SVG-PDA.  [name] Chronic diastolic heart failure     a. Echo 08/2011: mod LVH, nl EF. b. Echo 01/2013: EF 55-60%, mod-sev LVH, mod-sev LA dilitation.  . Carotid stenosis     a. Carotid US (08/2013):  R 60-79%; L 40-59%; f/u 6 mos  . HLD (hyperlipidemia)   . History of chicken pox   . Complication of anesthesia   . PONV (postoperative nausea and vomiting)   . Stroke 2006    denies residual  . Cough   . Morbid obesity   . Cough     a. 2014: Suspect a combination of CHF, GERD and ACE inhibitor-induced cough.  [name] HTN (hypertension)     Past Surgical History  Procedure Laterality Date  . Cholecystectomy  1990's  . Tubal ligation  1981  . Coronary artery bypass graft  09/07/2011    CABG X3; Procedure: CORONARY ARTERY BYPASS GRAFTING (CABG);  Surgeon: Alleen Borne, MD;  [facility];  Service: Open Heart Surgery;  Laterality: N/A;  . Left heart catheterization with coronary angiogram N/A 09/02/2011  Procedure: LEFT HEART CATHETERIZATION WITH CORONARY ANGIOGRAM;  Surgeon: Herby Abrahamhomas D Stuckey, MD;  [facility];  Service: Cardiovascular;  Laterality: N/A;    History   Social History  . Marital Status: Married    Spouse Name: N/A  . Number of Children: 3  . Years of Education: 16   Occupational History  . Teacher    Social History Main Topics  . Smoking status: Former Smoker -- 0.50 packs/day for 25 years    Types: Cigarettes  . Smokeless tobacco: Former NeurosurgeonUser    Quit date: 09/19/2012  . Alcohol Use: No  . Drug Use: No  . Sexual Activity: No   Other Topics Concern  . Not on file   Social History Narrative   Regular exercise-yes    ROS: nonproductive cough but no fevers or chills, productive cough, hemoptysis, dysphasia,  odynophagia, melena, hematochezia, dysuria, hematuria, rash, seizure activity, claudication. Remaining systems are negative.  Physical Exam: Well-developed obese in no acute distress.  Skin is warm and dry.  HEENT is normal.  Neck is supple.  Chest is clear to auscultation with normal expansion.  Cardiovascular exam is regular rate and rhythm.  Abdominal exam nontender or distended. No masses palpated. Extremities show 1+ edema. neuro grossly intact

## 2014-12-23 ENCOUNTER — Encounter: Payer: Self-pay | Admitting: Cardiology

## 2014-12-23 ENCOUNTER — Ambulatory Visit (INDEPENDENT_AMBULATORY_CARE_PROVIDER_SITE_OTHER): Payer: BC Managed Care – PPO | Admitting: Cardiology

## 2014-12-23 VITALS — BP 114/72 | HR 64 | Ht 67.0 in | Wt 324.4 lb

## 2014-12-23 DIAGNOSIS — I10 Essential (primary) hypertension: Secondary | ICD-10-CM

## 2014-12-23 DIAGNOSIS — I5032 Chronic diastolic (congestive) heart failure: Secondary | ICD-10-CM

## 2014-12-23 DIAGNOSIS — I6523 Occlusion and stenosis of bilateral carotid arteries: Secondary | ICD-10-CM

## 2014-12-23 DIAGNOSIS — Z951 Presence of aortocoronary bypass graft: Secondary | ICD-10-CM

## 2014-12-23 DIAGNOSIS — E785 Hyperlipidemia, unspecified: Secondary | ICD-10-CM

## 2014-12-23 MED ORDER — FUROSEMIDE 80 MG PO TABS: 80.0000 mg | ORAL_TABLET | Freq: Two times a day (BID) | ORAL | Status: DC

## 2014-12-23 NOTE — Assessment & Plan Note (Signed)
Continue statin. Check lipids and liver. 

## 2014-12-23 NOTE — Assessment & Plan Note (Signed)
Blood pressure controlled. Continue present medications. 

## 2014-12-23 NOTE — Assessment & Plan Note (Signed)
Continue aspirin and statin. I have reviewed her nuclear study.she does not have marked ischemia in the anterior or inferior walls. Her symptoms are most consistent with congestive heart failure. Continue aspirin and statin and increase diuretics. We will consider catheterization in the future if her symptoms worsen.

## 2014-12-23 NOTE — Assessment & Plan Note (Signed)
Continue aspirin and statin. Schedule followup carotid Dopplers. 

## 2014-12-23 NOTE — Patient Instructions (Signed)
Your physician recommends that you schedule a follow-up appointment in: 4-6 WEEKS WITH DR CRENSHAW  INCREASE FUROSEMIDE TO 120 MG IN THE MORNING AND 80 MG IN THE EVENING=1 AND 1/2 TABLETS IN THE MORNING AND 1 TABLET IN THE EVENING X 2 DAYS THEN TAKE 80 MG OF FUROSEMIDE TWICE DAILY  Your physician recommends that you return for lab work in: ONE WEEK-DO NOT EAT PRIOR TO LAB  Your physician has requested that you have a carotid duplex. This test is an ultrasound of the carotid arteries in your neck. It looks at blood flow through these arteries that supply the brain with blood. Allow one hour for this exam. There are no restrictions or special instructions.

## 2014-12-23 NOTE — Assessment & Plan Note (Signed)
Patient has chronic diastolic congestive heart failure. Her symptoms are consistent with heart failure at present and she is volume overloaded on examination. Change Lasix to 120 mg in the morning and 80 mg in the evening for 2 days and then to 80 mg twice a day thereafter. Check potassium and renal function in 1 week. I have instructed her on fluid restriction and low sodium diet.

## 2014-12-23 NOTE — Assessment & Plan Note (Signed)
Discussed weight loss. 

## 2014-12-31 LAB — COMPREHENSIVE METABOLIC PANEL
ALK PHOS: 146 U/L — AB (ref 39–117)
ALT: 18 U/L (ref 0–35)
AST: 17 U/L (ref 0–37)
Albumin: 4.1 g/dL (ref 3.5–5.2)
BUN: 18 mg/dL (ref 6–23)
CALCIUM: 9.6 mg/dL (ref 8.4–10.5)
CHLORIDE: 100 meq/L (ref 96–112)
CO2: 30 mEq/L (ref 19–32)
Creat: 0.82 mg/dL (ref 0.50–1.10)
Glucose, Bld: 257 mg/dL — ABNORMAL HIGH (ref 70–99)
POTASSIUM: 4.3 meq/L (ref 3.5–5.3)
SODIUM: 140 meq/L (ref 135–145)
TOTAL PROTEIN: 7.1 g/dL (ref 6.0–8.3)
Total Bilirubin: 0.4 mg/dL (ref 0.2–1.2)

## 2014-12-31 LAB — LIPID PANEL
Cholesterol: 154 mg/dL (ref 0–200)
HDL: 27 mg/dL — AB (ref >=46)
LDL CALC: 69 mg/dL (ref 0–99)
TRIGLYCERIDES: 288 mg/dL — AB (ref <150)
Total CHOL/HDL Ratio: 5.7 Ratio
VLDL: 58 mg/dL — AB (ref 0–40)

## 2015-01-05 ENCOUNTER — Ambulatory Visit (HOSPITAL_COMMUNITY)
Admission: RE | Admit: 2015-01-05 | Discharge: 2015-01-05 | Disposition: A | Discharge status: Home / Self Care | Payer: BC Managed Care – PPO | Source: Ambulatory Visit | Attending: Cardiovascular Disease | Admitting: Cardiovascular Disease

## 2015-01-05 DIAGNOSIS — E119 Type 2 diabetes mellitus without complications: Secondary | ICD-10-CM | POA: Insufficient documentation

## 2015-01-05 DIAGNOSIS — Z951 Presence of aortocoronary bypass graft: Secondary | ICD-10-CM | POA: Insufficient documentation

## 2015-01-05 DIAGNOSIS — I6529 Occlusion and stenosis of unspecified carotid artery: Secondary | ICD-10-CM | POA: Diagnosis not present

## 2015-01-05 DIAGNOSIS — I10 Essential (primary) hypertension: Secondary | ICD-10-CM | POA: Diagnosis not present

## 2015-01-05 DIAGNOSIS — E785 Hyperlipidemia, unspecified: Secondary | ICD-10-CM | POA: Insufficient documentation

## 2015-01-05 DIAGNOSIS — I6523 Occlusion and stenosis of bilateral carotid arteries: Secondary | ICD-10-CM | POA: Insufficient documentation

## 2015-01-05 NOTE — Progress Notes (Signed)
Carotid Duplex Completed. Shonita Rinck, BS, RDMS, RVT  

## 2015-01-07 ENCOUNTER — Other Ambulatory Visit: Payer: Self-pay | Admitting: *Deleted

## 2015-01-07 DIAGNOSIS — R748 Abnormal levels of other serum enzymes: Secondary | ICD-10-CM

## 2015-01-15 ENCOUNTER — Telehealth: Payer: Self-pay | Admitting: Cardiology

## 2015-01-15 DIAGNOSIS — R748 Abnormal levels of other serum enzymes: Secondary | ICD-10-CM

## 2015-01-15 NOTE — Telephone Encounter (Signed)
[facility] lab order for [facility].GGT and 5 Nucleotidase order entered.

## 2015-01-15 NOTE — Telephone Encounter (Signed)
Katrina called in needs some lab orders for the pt

## 2015-01-15 NOTE — Progress Notes (Signed)
HPI:  FU CAD and diastolic CHF. She is status post CABG in 08/2011.Echocardiogram February 2016 showed normal LV function and grade 2 diastolic dysfunction. Nuclear study March 2016 showed ejection fraction 53%. There were reversible defects in the anterior and inferior basal wall. At last office visit her Lasix was increased. Carotid Dopplers April 2016 showed 50-69% right and 1-49% left stenosis. Follow-up recommended 6 months. At last office visit Lasix was increased for heart failure. Since she was last seen, she has improved. She still has some dyspnea on exertion, lower extremity edema but no chest pain. Her dyspnea is improved compared to previous.  Current Outpatient Prescriptions  Medication Sig Dispense Refill  . albuterol (PROVENTIL HFA;VENTOLIN HFA) 108 (90 BASE) MCG/ACT inhaler Inhale 1 puff into the lungs every 4 (four) hours as needed for shortness of breath.    [name]. aspirin EC 325 MG EC tablet Take 1 tablet (325 mg total) by mouth daily. 30 tablet   . atorvastatin (LIPITOR) 40 MG tablet Take 1 tablet (40 mg total) by mouth daily. 30 tablet 3  . furosemide (LASIX) 80 MG tablet Take 1 tablet (80 mg total) by mouth 2 (two) times daily. 180 tablet 3  . HUMULIN R 500 UNIT/ML SOLN injection Inject 12 Units into the skin 3 (three) times daily with meals. Patient injects into skin 12 units in the morning, 8-12 units at lunch and 12 units at bedtime.    . metoprolol succinate (TOPROL-XL) 25 MG 24 hr tablet Take 25 mg by mouth daily.    . pantoprazole (PROTONIX) 40 MG tablet Take 1 tablet (40 mg total) by mouth daily. 30 tablet 11  . potassium chloride SA (K-DUR,KLOR-CON) 20 MEQ tablet Take 40 mEq by mouth daily.    [name]. VICTOZA 18 MG/3ML SOPN Inject 1.8 mg as directed daily.  4   No current facility-administered medications for this visit.     Past Medical History  Diagnosis Date  . Asthma   . Diabetes mellitus   . Sleep disturbance     Had sleep study several years ago, wasn't  told she has OSA but told she needs O2 at night  . CAD (coronary artery disease)     a. NSTEMI 12/12 -> s/p CABGx3.   . 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 Laneta SimmersBartle;   LIMA-LAD, SVG-OM 2, SVG-PDA.  [name]. Chronic diastolic heart failure     a. Echo 08/2011: mod LVH, nl EF. b. Echo 01/2013: EF 55-60%, mod-sev LVH, mod-sev LA dilitation.  . Carotid stenosis     a. Carotid US (08/2013):  R 60-79%; L 40-59%; f/u 6 mos  . HLD (hyperlipidemia)   . History of chicken pox   . Complication of anesthesia   . PONV (postoperative nausea and vomiting)   . Stroke 2006    denies residual  . Cough   . Morbid obesity   . Cough     a. 2014: Suspect a combination of CHF, GERD and ACE inhibitor-induced cough.  [name]. HTN (hypertension)     Past Surgical History  Procedure Laterality Date  . Cholecystectomy  1990's  . Tubal ligation  1981  . Coronary artery bypass graft  09/07/2011    CABG X3; Procedure: CORONARY ARTERY BYPASS GRAFTING (CABG);  Surgeon: Alleen BorneBryan K Bartle, MD;  [facility];  Service: Open Heart Surgery;  Laterality: N/A;  . Left heart catheterization with coronary angiogram N/A 09/02/2011    Procedure: LEFT HEART CATHETERIZATION WITH CORONARY ANGIOGRAM;  Surgeon: Herby Abraham, MD;  Location: [facility] C[facility] LAB;  Service: Cardiovascular;  Laterality: N/A;    History   Social History  . Marital Status: Married    Spouse Name: N/A  . Number of Children: 3  . Years of Education: 16   Occupational History  . Teacher    Social History Main Topics  . Smoking status: Former Smoker -- 0.50 packs/day for 25 years    Types: Cigarettes  . Smokeless tobacco: Former Neurosurgeon    Quit date: 09/19/2012  . Alcohol Use: No  . Drug Use: No  . Sexual Activity: No   Other Topics Concern  . Not on file   Social History Narrative   Regular exercise-yes    ROS: no fevers or chills, productive cough, hemoptysis, dysphasia, odynophagia, melena, hematochezia, dysuria, hematuria, rash, seizure activity, orthopnea,  PND, claudication. Remaining systems are negative.  Physical Exam: Well-developed morbidly obese in no acute distress.  Skin is warm and dry.  HEENT is normal.  Neck is supple.  Chest is clear to auscultation with normal expansion.  Cardiovascular exam is regular rate and rhythm.  Abdominal exam nontender or distended. No masses palpated. Extremities show 1+ edema. neuro grossly intact

## 2015-01-16 LAB — GAMMA GT: GGT: 13 U/L (ref 7–51)

## 2015-01-20 ENCOUNTER — Ambulatory Visit (INDEPENDENT_AMBULATORY_CARE_PROVIDER_SITE_OTHER): Payer: BC Managed Care – PPO | Admitting: Cardiology

## 2015-01-20 ENCOUNTER — Encounter: Payer: Self-pay | Admitting: Cardiology

## 2015-01-20 VITALS — BP 128/72 | HR 66 | Ht 67.0 in | Wt 320.0 lb

## 2015-01-20 DIAGNOSIS — E785 Hyperlipidemia, unspecified: Secondary | ICD-10-CM | POA: Diagnosis not present

## 2015-01-20 DIAGNOSIS — I5032 Chronic diastolic (congestive) heart failure: Secondary | ICD-10-CM

## 2015-01-20 DIAGNOSIS — I10 Essential (primary) hypertension: Secondary | ICD-10-CM

## 2015-01-20 DIAGNOSIS — I6529 Occlusion and stenosis of unspecified carotid artery: Secondary | ICD-10-CM

## 2015-01-20 DIAGNOSIS — I5033 Acute on chronic diastolic (congestive) heart failure: Secondary | ICD-10-CM | POA: Diagnosis not present

## 2015-01-20 DIAGNOSIS — Z951 Presence of aortocoronary bypass graft: Secondary | ICD-10-CM

## 2015-01-20 MED ORDER — FUROSEMIDE 40 MG PO TABS
40.0000 mg | ORAL_TABLET | Freq: Every day | ORAL | Status: DC
Start: 2015-01-20 — End: 2015-04-23

## 2015-01-20 MED ORDER — METOPROLOL SUCCINATE ER 25 MG PO TB24: 25.0000 mg | ORAL_TABLET | Freq: Every day | ORAL | Status: DC

## 2015-01-20 NOTE — Assessment & Plan Note (Signed)
Continue aspirin and statin. Previous nuclear study was felt to be low risk. Plan medical therapy as most of her symptoms are felt secondary to CHF.

## 2015-01-20 NOTE — Assessment & Plan Note (Signed)
Continue statin. 

## 2015-01-20 NOTE — Patient Instructions (Signed)
Your physician recommends that you schedule a follow-up appointment in: 3 MONTHS WITH DR CRENSHAW  INCREASE FUROSEMIDE TO 120 MG TWICE DAILY X 5 DAYS= 1 OF THE 80 MG TABLETS AND 1 OF THE 40 MG TABLETS TWICE DAILY  AFTER 5 DAYS GO TO FUROSEMIDE 120 MG I N THE AM AND 80 MG IN THE PM  Your physician recommends that you return for lab work in:ONE WEEK

## 2015-01-20 NOTE — Assessment & Plan Note (Signed)
Patient is improving but still volume overloaded. Change Lasix to 120 mg twice a day for 5 days and then 120 mg in the morning and 80 mg in the evening thereafter. Check potassium and renal function in 1 week. We again discussed daily weights, low sodium diet and fluid restriction.

## 2015-01-20 NOTE — Assessment & Plan Note (Signed)
Blood pressure controlled. Continue present medications. 

## 2015-01-20 NOTE — Assessment & Plan Note (Signed)
Discussed weight loss. 

## 2015-01-20 NOTE — Assessment & Plan Note (Signed)
Continue aspirin and statin. Follow-up carotid Dopplers October 2016.

## 2015-01-21 LAB — NUCLEOTIDASE, 5', BLOOD: 5-Nucleotidase: 3 U/L (ref <11)

## 2015-01-28 LAB — BASIC METABOLIC PANEL WITH GFR
BUN: 21 mg/dL (ref 6–23)
CALCIUM: 9.6 mg/dL (ref 8.4–10.5)
CO2: 30 mEq/L (ref 19–32)
CREATININE: 0.99 mg/dL (ref 0.50–1.10)
Chloride: 99 mEq/L (ref 96–112)
GFR, EST AFRICAN AMERICAN: 72 mL/min
GFR, Est Non African American: 63 mL/min
Glucose, Bld: 126 mg/dL — ABNORMAL HIGH (ref 70–99)
Potassium: 4.1 mEq/L (ref 3.5–5.3)
SODIUM: 139 meq/L (ref 135–145)

## 2015-03-20 ENCOUNTER — Other Ambulatory Visit: Payer: Self-pay | Admitting: *Deleted

## 2015-03-20 MED ORDER — ATORVASTATIN CALCIUM 40 MG PO TABS: 40.0000 mg | ORAL_TABLET | Freq: Every day | ORAL | Status: DC

## 2015-03-20 NOTE — Telephone Encounter (Signed)
Rx(s) sent to pharmacy electronically.  

## 2015-03-24 ENCOUNTER — Telehealth: Payer: Self-pay | Admitting: *Deleted

## 2015-03-24 NOTE — Telephone Encounter (Signed)
Spoke with pt, she is not using pantoprazole. Prior Berkley Harveyauth will be stopped.

## 2015-04-03 ENCOUNTER — Encounter: Payer: Self-pay | Admitting: Cardiology

## 2015-04-21 NOTE — Progress Notes (Signed)
HPI: FU CAD and diastolic CHF. She is status post CABG in 08/2011.Echocardiogram February 2016 showed normal LV function and grade 2 diastolic dysfunction. Nuclear study March 2016 showed ejection fraction 53%. There were reversible defects in the anterior and inferior basal wall. Carotid Dopplers April 2016 showed 50-69% right and 1-49% left stenosis. Follow-up recommended 6 months. At last office visit Lasix was increased for heart failure. Since she was last seen, the patient has dyspnea with more extreme activities but not with routine activities. It is relieved with rest. It is not associated with chest pain. There is no orthopnea, PND or pedal edema. There is no syncope or palpitations. There is no exertional chest pain.   Current Outpatient Prescriptions  Medication Sig Dispense Refill  . albuterol (PROVENTIL HFA;VENTOLIN HFA) 108 (90 BASE) MCG/ACT inhaler Inhale 1 puff into the lungs every 4 (four) hours as needed for shortness of breath.    [name] albuterol (PROVENTIL HFA;VENTOLIN HFA) 108 (90 BASE) MCG/ACT inhaler Inhale 2 puffs into the lungs every 6 (six) hours as needed for wheezing or shortness of breath. 1 Inhaler 1  . aspirin 81 MG tablet Take 81 mg by mouth daily.    [name] aspirin EC 325 MG EC tablet Take 1 tablet (325 mg total) by mouth daily. 30 tablet   . atorvastatin (LIPITOR) 40 MG tablet Take 40 mg by mouth daily.    [name] atorvastatin (LIPITOR) 40 MG tablet Take 1 tablet (40 mg total) by mouth daily. 30 tablet 5  . furosemide (LASIX) 40 MG tablet Take 2 tablets (80 mg total) by mouth daily. 60 tablet 2  . furosemide (LASIX) 80 MG tablet Take 1 tablet (80 mg total) by mouth 2 (two) times daily. 180 tablet 3  . insulin regular human CONCENTRATED (HUMULIN R) 500 UNIT/ML SOLN injection Inject 8-16 Units into the skin 3 (three) times daily with meals. 20 units in the morning Mid day dose is sliding scale ~12 units in the evening Max 65 units daily    . Liraglutide 18 MG/3ML SOPN  Inject 1.8 mg into the skin daily.    . metoprolol succinate (TOPROL-XL) 25 MG 24 hr tablet Take 1 tablet (25 mg total) by mouth daily. 90 tablet 3  . potassium chloride SA (K-DUR,KLOR-CON) 20 MEQ tablet Take 40 mEq by mouth daily.    [name] VICTOZA 18 MG/3ML SOPN Inject 1.8 mg as directed daily.  4   No current facility-administered medications for this visit.     Past Medical History  Diagnosis Date  . Coronary artery disease     a. NSTEMI 12/12 -> s/p CABGx3.  . Diabetes mellitus   . Chronic diastolic CHF (congestive heart failure)     a. Echo 08/2011: mod LVH, nl EF. b. Echo 01/2013: EF 55-60%, mod-sev LVH, mod-sev LA dilitation  . Hypertension   . Complication of anesthesia     '' Myself & my family wake up during surgery "  . Myocardial infarction   . TIA (transient ischemic attack)   . Asthma   . Sleep disturbance     a. Had sleep study several years ago, wasn't told she has OSA but told she needs O2 at night.  . S/P CABG (coronary artery bypass graft)     a. 2012 - Dr. Laneta Simmers;  LIMA-LAD, SVG-OM 2, SVG-PDA.  . Carotid artery disease     a. Carotid US (08/2013): R 60-79%; L 40-59%; f/u 6 mos.  . Hyperlipidemia   . Stroke   .  Asthma   . Diabetes mellitus   . Sleep disturbance     Had sleep study several years ago, wasn't told she has OSA but told she needs O2 at night  . CAD (coronary artery disease)     a. NSTEMI 12/12 -> s/p CABGx3.   . 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 Laneta Simmers;   LIMA-LAD, SVG-OM 2, SVG-PDA.  [name] Chronic diastolic heart failure     a. Echo 08/2011: mod LVH, nl EF. b. Echo 01/2013: EF 55-60%, mod-sev LVH, mod-sev LA dilitation.  . Carotid stenosis     a. Carotid US (08/2013):  R 60-79%; L 40-59%; f/u 6 mos  . HLD (hyperlipidemia)   . History of chicken pox   . Complication of anesthesia   . PONV (postoperative nausea and vomiting)   . Stroke 2006    denies residual  . Cough   . Morbid obesity   . Cough     a. 2014: Suspect a combination of CHF, GERD and  ACE inhibitor-induced cough.  [name] HTN (hypertension)     Past Surgical History  Procedure Laterality Date  . Cholecystectomy    . Coronary artery bypass graft    . Cholecystectomy  1990's  . Tubal ligation  1981  . Coronary artery bypass graft  09/07/2011    CABG X3; Procedure: CORONARY ARTERY BYPASS GRAFTING (CABG);  Surgeon: Alleen Borne, MD;  Location: [facility] OR;  Service: Open Heart Surgery;  Laterality: N/A;  . Left heart catheterization with coronary angiogram N/A 09/02/2011    Procedure: LEFT HEART CATHETERIZATION WITH CORONARY ANGIOGRAM;  Surgeon: Herby Abraham, MD;  Location: [facility] C[facility] LAB;  Service: Cardiovascular;  Laterality: N/A;    History   Social History  . Marital Status: Married    Spouse Name: N/A  . Number of Children: 3  . Years of Education: 16   Occupational History  . Teacher    Social History Main Topics  . Smoking status: Former Smoker -- 0.50 packs/day for 25 years    Types: Cigarettes    Quit date: 11/11/2011  . Smokeless tobacco: Former Neurosurgeon    Quit date: 09/19/2012  . Alcohol Use: No  . Drug Use: No  . Sexual Activity: No   Other Topics Concern  . Not on file   Social History Narrative   ** Merged History Encounter **       Regular exercise-yes    ROS: no fevers or chills, productive cough, hemoptysis, dysphasia, odynophagia, melena, hematochezia, dysuria, hematuria, rash, seizure activity, orthopnea, PND, pedal edema, claudication. Remaining systems are negative.  Physical Exam: Well-developed obese in no acute distress.  Skin is warm and dry.  HEENT is normal.  Neck is supple.  Chest is clear to auscultation with normal expansion.  Cardiovascular exam is regular rate and rhythm.  Abdominal exam nontender or distended. No masses palpated. Extremities show 1+ edema. neuro grossly intact  Sinus rhythm at a rate of 77. Left anterior fascicular block. Septal infarct.

## 2015-04-23 ENCOUNTER — Ambulatory Visit (INDEPENDENT_AMBULATORY_CARE_PROVIDER_SITE_OTHER): Payer: BC Managed Care – PPO | Admitting: Cardiology

## 2015-04-23 ENCOUNTER — Encounter: Payer: Self-pay | Admitting: Cardiology

## 2015-04-23 VITALS — BP 140/60 | HR 77 | Ht 66.0 in | Wt 317.6 lb

## 2015-04-23 DIAGNOSIS — E785 Hyperlipidemia, unspecified: Secondary | ICD-10-CM | POA: Diagnosis not present

## 2015-04-23 DIAGNOSIS — I5033 Acute on chronic diastolic (congestive) heart failure: Secondary | ICD-10-CM

## 2015-04-23 DIAGNOSIS — I5032 Chronic diastolic (congestive) heart failure: Secondary | ICD-10-CM | POA: Diagnosis not present

## 2015-04-23 MED ORDER — ATORVASTATIN CALCIUM 80 MG PO TABS: 80.0000 mg | ORAL_TABLET | Freq: Every day | ORAL | Status: DC

## 2015-04-23 MED ORDER — FUROSEMIDE 80 MG PO TABS: ORAL_TABLET | ORAL | Status: DC

## 2015-04-23 NOTE — Assessment & Plan Note (Signed)
Follow-up carotid Dopplers October 2016. 

## 2015-04-23 NOTE — Assessment & Plan Note (Signed)
Discussed weight loss. 

## 2015-04-23 NOTE — Assessment & Plan Note (Signed)
Patient is mildly volume overloaded on examination. Change Lasix to 120 mg in the morning and 80 mg in the evening. In one week check potassium, renal function and BNP. We discussed low-sodium diet and fluid restriction. She will weigh herself daily and take an additional 80 mg of Lasix for weight gain of 2-3 pounds.

## 2015-04-23 NOTE — Assessment & Plan Note (Signed)
Blood pressure controlled. Continue present medications. 

## 2015-04-23 NOTE — Assessment & Plan Note (Signed)
Previous nuclear study felt to be low risk. Continue medical therapy. Continue aspirin and statin.

## 2015-04-23 NOTE — Assessment & Plan Note (Addendum)
Change Lipitor to 80 mg daily. Check lipids and liver in 4 weeks. 

## 2015-04-23 NOTE — Patient Instructions (Signed)
Your physician wants you to follow-up in: 6 MONTHS WITH DR Jens Som You will receive a reminder letter in the mail two months in advance. If you don't receive a letter, please call our office to schedule the follow-up appointment.   INCREASE FUROSEMIDE TO 120 MG IN THE AM AND 80 MG IN THE PM= 1 AND 1/2 TABLETS IN THE MORNING AND 1 TABLET IN THE AFTERNOON  Your physician recommends that you return for lab work in: ONE WEEK  INCREASE ATORVASTATIN TO 80 MG ONCE DAILY= 2 OF THE 40 MG TABLETS ONCE DAILY  Your physician recommends that you return for lab work in: 4 WEEKS = DO NOT EAT PRIOR TO LAB WORK

## 2015-11-02 ENCOUNTER — Ambulatory Visit (INDEPENDENT_AMBULATORY_CARE_PROVIDER_SITE_OTHER): Payer: BC Managed Care – PPO | Admitting: Cardiology

## 2015-11-02 ENCOUNTER — Encounter: Payer: Self-pay | Admitting: Cardiology

## 2015-11-02 VITALS — BP 124/64 | HR 93 | Ht 65.0 in | Wt 300.0 lb

## 2015-11-02 DIAGNOSIS — I679 Cerebrovascular disease, unspecified: Secondary | ICD-10-CM

## 2015-11-02 DIAGNOSIS — E785 Hyperlipidemia, unspecified: Secondary | ICD-10-CM | POA: Diagnosis not present

## 2015-11-02 DIAGNOSIS — I25810 Atherosclerosis of coronary artery bypass graft(s) without angina pectoris: Secondary | ICD-10-CM | POA: Diagnosis not present

## 2015-11-02 DIAGNOSIS — E669 Obesity, unspecified: Secondary | ICD-10-CM

## 2015-11-02 DIAGNOSIS — I5033 Acute on chronic diastolic (congestive) heart failure: Secondary | ICD-10-CM

## 2015-11-02 DIAGNOSIS — I10 Essential (primary) hypertension: Secondary | ICD-10-CM

## 2015-11-02 MED ORDER — FUROSEMIDE 40 MG PO TABS: 120.0000 mg | ORAL_TABLET | Freq: Two times a day (BID) | ORAL | Status: DC

## 2015-11-02 NOTE — Assessment & Plan Note (Signed)
Patient has worsening congestive heart failure symptoms. Increase Lasix to 120 mg twice a day. In one week check potassium and renal function.We discussed the importance of fluid restriction and low sodium diet. She will weigh daily and take an additional 40 mg for weight gain of 2-3 pounds.

## 2015-11-02 NOTE — Assessment & Plan Note (Signed)
Continue aspirin and statin. 

## 2015-11-02 NOTE — Progress Notes (Signed)
HPI: FU CAD and diastolic CHF. She is status post CABG in 08/2011.Echocardiogram February 2016 showed normal LV function and grade 2 diastolic dysfunction. Nuclear study March 2016 showed ejection fraction 53%. There were reversible defects in the anterior and inferior basal wall. Carotid Dopplers April 2016 showed 50-69% right and 1-49% left stenosis. Follow-up recommended 6 months. Since she was last seen, She notes increased dyspnea on exertion. There is orthopnea and a cough with lying flat. She has noted pedal edema. No chest pain, palpitations or syncope.  Current Outpatient Prescriptions  Medication Sig Dispense Refill  . albuterol (PROVENTIL HFA;VENTOLIN HFA) 108 (90 BASE) MCG/ACT inhaler Inhale 1 puff into the lungs every 4 (four) hours as needed for shortness of breath.    [name] albuterol (PROVENTIL HFA;VENTOLIN HFA) 108 (90 BASE) MCG/ACT inhaler Inhale 2 puffs into the lungs every 6 (six) hours as needed for wheezing or shortness of breath. 1 Inhaler 1  . aspirin EC 325 MG EC tablet Take 1 tablet (325 mg total) by mouth daily. 30 tablet   . atorvastatin (LIPITOR) 40 MG tablet Take 1 tablet (40 mg total) by mouth daily. 30 tablet 5  . atorvastatin (LIPITOR) 80 MG tablet Take 1 tablet (80 mg total) by mouth daily. 90 tablet 3  . furosemide (LASIX) 40 MG tablet Take 2 tablets (80 mg total) by mouth daily. 60 tablet 2  . furosemide (LASIX) 80 MG tablet TAKE 1 AND 1/2 TABLETS IN THE MORNING AND 1 TABLET IN THE AFTERNOON 270 tablet 3  . insulin regular human CONCENTRATED (HUMULIN R) 500 UNIT/ML SOLN injection Inject 8-16 Units into the skin 3 (three) times daily with meals. 20 units in the morning Mid day dose is sliding scale ~12 units in the evening Max 65 units daily    . Liraglutide 18 MG/3ML SOPN Inject 1.8 mg into the skin daily.    . metoprolol succinate (TOPROL-XL) 25 MG 24 hr tablet Take 1 tablet (25 mg total) by mouth daily. 90 tablet 3  . potassium chloride SA (K-DUR,KLOR-CON)  20 MEQ tablet Take 40 mEq by mouth daily.    [name] VICTOZA 18 MG/3ML SOPN Inject 1.8 mg as directed daily.  4   No current facility-administered medications for this visit.     Past Medical History  Diagnosis Date  . Coronary artery disease     a. NSTEMI 12/12 -> s/p CABGx3.  . Diabetes mellitus (HCC)   . Chronic diastolic CHF (congestive heart failure) (HCC)     a. Echo 08/2011: mod LVH, nl EF. b. Echo 01/2013: EF 55-60%, mod-sev LVH, mod-sev LA dilitation  . Hypertension   . Complication of anesthesia     '' Myself & my family wake up during surgery "  . Myocardial infarction (HCC)   . TIA (transient ischemic attack)   . Asthma   . Sleep disturbance     a. Had sleep study several years ago, wasn't told she has OSA but told she needs O2 at night.  . S/P CABG (coronary artery bypass graft)     a. 2012 - Dr. Laneta Simmers;  LIMA-LAD, SVG-OM 2, SVG-PDA.  . Carotid artery disease (HCC)     a. Carotid US (08/2013): R 60-79%; L 40-59%; f/u 6 mos.  . Hyperlipidemia   . Stroke (HCC)   . Asthma   . Diabetes mellitus   . Sleep disturbance     Had sleep study several years ago, wasn't told she has OSA but told she needs  O2 at night  . CAD (coronary artery disease)     a. NSTEMI 12/12 -> s/p CABGx3.   . 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 Laneta Simmers;   LIMA-LAD, SVG-OM 2, SVG-PDA.  [name] Chronic diastolic heart failure (HCC)     a. Echo 08/2011: mod LVH, nl EF. b. Echo 01/2013: EF 55-60%, mod-sev LVH, mod-sev LA dilitation.  . Carotid stenosis     a. Carotid US (08/2013):  R 60-79%; L 40-59%; f/u 6 mos  . HLD (hyperlipidemia)   . History of chicken pox   . Complication of anesthesia   . PONV (postoperative nausea and vomiting)   . Stroke [facility]) 2006    denies residual  . Cough   . Morbid obesity (HCC)   . Cough     a. 2014: Suspect a combination of CHF, GERD and ACE inhibitor-induced cough.  [name] HTN (hypertension)     Past Surgical History  Procedure Laterality Date  . Cholecystectomy    . Coronary  artery bypass graft    . Cholecystectomy  1990's  . Tubal ligation  1981  . Coronary artery bypass graft  09/07/2011    CABG X3; Procedure: CORONARY ARTERY BYPASS GRAFTING (CABG);  Surgeon: Alleen Borne, MD;  Location: [facility] OR;  Service: Open Heart Surgery;  Laterality: N/A;  . Left heart catheterization with coronary angiogram N/A 09/02/2011    Procedure: LEFT HEART CATHETERIZATION WITH CORONARY ANGIOGRAM;  Surgeon: Herby Abraham, MD;  Location: [facility] CATH LAB;  Service: Cardiovascular;  Laterality: N/A;    Social History   Social History  . Marital Status: Married    Spouse Name: N/A  . Number of Children: 3  . Years of Education: 16   Occupational History  . Teacher    Social History Main Topics  . Smoking status: Former Smoker -- 0.50 packs/day for 25 years    Types: Cigarettes    Quit date: 11/11/2011  . Smokeless tobacco: Former Neurosurgeon    Quit date: 09/19/2012  . Alcohol Use: No  . Drug Use: No  . Sexual Activity: No   Other Topics Concern  . Not on file   Social History Narrative   ** Merged History Encounter **       Regular exercise-yes    Family History  Problem Relation Age of Onset  . Cancer Mother   . Heart failure Mother     Also has hx MVP s/p MVR  . Diabetes Mother   . Coronary artery disease Father     Died of MI at age 69  . Heart disease Other     Parent    ROS: no fevers or chills, productive cough, hemoptysis, dysphasia, odynophagia, melena, hematochezia, dysuria, hematuria, rash, seizure activity, orthopnea, PND, claudication. Remaining systems are negative.  Physical Exam: Well-developed obese in no acute distress.  Skin is warm and dry.  HEENT is normal.  Neck is supple.  Chest is clear to auscultation with normal expansion.  Cardiovascular exam is regular rate and rhythm.  Abdominal exam nontender or distended. No masses palpated. Extremities show trace edema. neuro grossly intact  ECG Sinus rhythm at a rate of 93. Left anterior  fascicular block. Left ventricular hypertrophy. Prior anterior infarct. Lateral T-wave inversion.

## 2015-11-02 NOTE — Assessment & Plan Note (Signed)
Discussed weight loss. 

## 2015-11-02 NOTE — Patient Instructions (Signed)
Medication Instructions:   INCREASE FUROSEMIDE TO 120 MG TWICE DAILY= 3 OF THE 40 MG TABLETS TWICE DAILY  Labwork:  Your physician recommends that you return for lab work in: ONE WEEK  Testing/Procedures:  Your physician has requested that you have a carotid duplex. This test is an ultrasound of the carotid arteries in your neck. It looks at blood flow through these arteries that supply the brain with blood. Allow one hour for this exam. There are no restrictions or special instructions.    Follow-Up:  Your physician recommends that you schedule a follow-up appointment in: 8 WEEKS WITH DR Jens Som    If you need a refill on your cardiac medications before your next appointment, please call your pharmacy.

## 2015-11-02 NOTE — Assessment & Plan Note (Signed)
Continue statin. 

## 2015-11-02 NOTE — Assessment & Plan Note (Signed)
Blood pressure controlled. Continue present medications. 

## 2015-11-10 LAB — BASIC METABOLIC PANEL
BUN: 58 mg/dL — AB (ref 7–25)
CALCIUM: 10.4 mg/dL (ref 8.6–10.4)
CO2: 39 mmol/L — AB (ref 20–31)
Chloride: 86 mmol/L — ABNORMAL LOW (ref 98–110)
Creat: 1.78 mg/dL — ABNORMAL HIGH (ref 0.50–0.99)
GLUCOSE: 103 mg/dL — AB (ref 65–99)
POTASSIUM: 3.2 mmol/L — AB (ref 3.5–5.3)
SODIUM: 139 mmol/L (ref 135–146)

## 2015-11-13 ENCOUNTER — Telehealth: Payer: Self-pay | Admitting: Cardiology

## 2015-11-13 NOTE — Telephone Encounter (Signed)
FU PAOV Olga Millers

## 2015-11-13 NOTE — Telephone Encounter (Signed)
Left msg for patient - recommendation for PA visit. Will send note to scheduling.

## 2015-11-13 NOTE — Telephone Encounter (Signed)
Returned call - no answer - left voice mail message for patient to call.  Per BMET results, Dr. Jens Som had recommended decrease of furosemide dose from  BID to  BID w/ 1x dose of extra K+, and a recheck on BMET in 1 week. Felicia Rivas had attempted to reach pt and left msg to call.  Apparent worse/continued swelling & cough - routed to Dr. Jens Som for further advice.

## 2015-11-13 NOTE — Telephone Encounter (Signed)
New message      Pt c/o swelling: STAT is pt has developed SOB within 24 hours  1. How long have you been experiencing swelling? About 1 wk  2. Where is the swelling located?  Legs and hands 3.  Are you currently taking a "fluid pill"?  yes  4.  Are you currently SOB?no 5.  Have you traveled recently?no Pt is also very tired.  She states her cough is worse

## 2015-11-13 NOTE — Telephone Encounter (Signed)
Pt called back. Reports fatigue and continued cough. She acknowledged recommendations given based on last BMET - dose reduction of furosemide, extra 1x dose of potassium chloride.  I advised to do the extra potassium, and will seek further instruction on what to do w/ furosemide in context of continued swelling. 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 No use of compression devices.   States OK to leave detailed VM on home phone.

## 2015-11-17 ENCOUNTER — Ambulatory Visit: Payer: BC Managed Care – PPO | Admitting: Cardiology

## 2015-11-19 ENCOUNTER — Inpatient Hospital Stay (HOSPITAL_COMMUNITY): Admission: RE | Admit: 2015-11-19 | Payer: Self-pay | Source: Ambulatory Visit

## 2015-11-19 DIAGNOSIS — E11319 Type 2 diabetes mellitus with unspecified diabetic retinopathy without macular edema: Secondary | ICD-10-CM | POA: Insufficient documentation

## 2015-11-19 DIAGNOSIS — Z794 Long term (current) use of insulin: Secondary | ICD-10-CM | POA: Insufficient documentation

## 2015-11-19 LAB — HM DIABETES EYE EXAM

## 2015-12-08 ENCOUNTER — Encounter: Payer: Self-pay | Admitting: Cardiology

## 2015-12-18 ENCOUNTER — Telehealth: Payer: Self-pay | Admitting: Cardiology

## 2015-12-18 NOTE — Telephone Encounter (Signed)
Follow Up:    She wants to know if her form is ready please.

## 2015-12-18 NOTE — Telephone Encounter (Signed)
Spoke with pt, form faxed to [phone].

## 2015-12-23 NOTE — Progress Notes (Signed)
HPI: FU CAD and diastolic CHF. She is status post CABG in 08/2011.Echocardiogram February 2016 showed normal LV function and grade 2 diastolic dysfunction. Nuclear study March 2016 showed ejection fraction 53%. There were reversible defects in the anterior and inferior basal wall. Carotid Dopplers April 2016 showed 50-69% right and 1-49% left stenosis. Follow-up recommended 6 months. Since she was last seen,   Current Outpatient Prescriptions  Medication Sig Dispense Refill  . albuterol (PROVENTIL HFA;VENTOLIN HFA) 108 (90 BASE) MCG/ACT inhaler Inhale 1 puff into the lungs every 4 (four) hours as needed for shortness of breath.    [name]. albuterol (PROVENTIL HFA;VENTOLIN HFA) 108 (90 BASE) MCG/ACT inhaler Inhale 2 puffs into the lungs every 6 (six) hours as needed for wheezing or shortness of breath. 1 Inhaler 1  . aspirin EC 325 MG EC tablet Take 1 tablet (325 mg total) by mouth daily. 30 tablet   . atorvastatin (LIPITOR) 40 MG tablet Take 1 tablet (40 mg total) by mouth daily. 30 tablet 5  . atorvastatin (LIPITOR) 80 MG tablet Take 1 tablet (80 mg total) by mouth daily. 90 tablet 3  . furosemide (LASIX) 40 MG tablet Take 3 tablets (120 mg total) by mouth 2 (two) times daily. 120 tablet 6  . insulin regular human CONCENTRATED (HUMULIN R) 500 UNIT/ML SOLN injection Inject 8-16 Units into the skin 3 (three) times daily with meals. 20 units in the morning Mid day dose is sliding scale ~12 units in the evening Max 65 units daily    . Liraglutide 18 MG/3ML SOPN Inject 1.8 mg into the skin daily.    . metoprolol succinate (TOPROL-XL) 25 MG 24 hr tablet Take 1 tablet (25 mg total) by mouth daily. 90 tablet 3  . potassium chloride SA (K-DUR,KLOR-CON) 20 MEQ tablet Take 40 mEq by mouth daily.    [name]. VICTOZA 18 MG/3ML SOPN Inject 1.8 mg as directed daily.  4   No current facility-administered medications for this visit.     Past Medical History  Diagnosis Date  . Coronary artery disease     a.  NSTEMI 12/12 -> s/p CABGx3.  . Diabetes mellitus (HCC)   . Chronic diastolic CHF (congestive heart failure) (HCC)     a. Echo 08/2011: mod LVH, nl EF. b. Echo 01/2013: EF 55-60%, mod-sev LVH, mod-sev LA dilitation  . Hypertension   . Complication of anesthesia     '' Myself & my family wake up during surgery "  . Myocardial infarction (HCC)   . TIA (transient ischemic attack)   . Asthma   . Sleep disturbance     a. Had sleep study several years ago, wasn't told she has OSA but told she needs O2 at night.  . S/P CABG (coronary artery bypass graft)     a. 2012 - Dr. Laneta SimmersBartle;  LIMA-LAD, SVG-OM 2, SVG-PDA.  . Carotid artery disease (HCC)     a. Carotid US (08/2013): R 60-79%; L 40-59%; f/u 6 mos.  . Hyperlipidemia   . Stroke (HCC)   . Asthma   . Diabetes mellitus   . Sleep disturbance     Had sleep study several years ago, wasn't told she has OSA but told she needs O2 at night  . CAD (coronary artery disease)     a. NSTEMI 12/12 -> s/p CABGx3.   . 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 Laneta SimmersBartle;   LIMA-LAD, SVG-OM 2, SVG-PDA.  [name]. Chronic diastolic heart failure (HCC)  a. Echo 08/2011: mod LVH, nl EF. b. Echo 01/2013: EF 55-60%, mod-sev LVH, mod-sev LA dilitation.  . Carotid stenosis     a. Carotid US (08/2013):  R 60-79%; L 40-59%; f/u 6 mos  . HLD (hyperlipidemia)   . History of chicken pox   . Complication of anesthesia   . PONV (postoperative nausea and vomiting)   . Stroke [facility]) 2006    denies residual  . Cough   . Morbid obesity (HCC)   . Cough     a. 2014: Suspect a combination of CHF, GERD and ACE inhibitor-induced cough.  [name] HTN (hypertension)     Past Surgical History  Procedure Laterality Date  . Cholecystectomy    . Coronary artery bypass graft    . Cholecystectomy  1990's  . Tubal ligation  1981  . Coronary artery bypass graft  09/07/2011    CABG X3; Procedure: CORONARY ARTERY BYPASS GRAFTING (CABG);  Surgeon: Alleen Borne, MD;  Location: [facility] OR;  Service: Open Heart  Surgery;  Laterality: N/A;  . Left heart catheterization with coronary angiogram N/A 09/02/2011    Procedure: LEFT HEART CATHETERIZATION WITH CORONARY ANGIOGRAM;  Surgeon: Herby Abraham, MD;  Location: [facility] CATH LAB;  Service: Cardiovascular;  Laterality: N/A;    Social History   Social History  . Marital Status: Married    Spouse Name: N/A  . Number of Children: 3  . Years of Education: 16   Occupational History  . Teacher    Social History Main Topics  . Smoking status: Former Smoker -- 0.50 packs/day for 25 years    Types: Cigarettes    Quit date: 11/11/2011  . Smokeless tobacco: Former Neurosurgeon    Quit date: 09/19/2012  . Alcohol Use: No  . Drug Use: No  . Sexual Activity: No   Other Topics Concern  . Not on file   Social History Narrative   ** Merged History Encounter **       Regular exercise-yes    Family History  Problem Relation Age of Onset  . Cancer Mother   . Heart failure Mother     Also has hx MVP s/p MVR  . Diabetes Mother   . Coronary artery disease Father     Died of MI at age 48  . Heart disease Other     Parent    ROS: no fevers or chills, productive cough, hemoptysis, dysphasia, odynophagia, melena, hematochezia, dysuria, hematuria, rash, seizure activity, orthopnea, PND, pedal edema, claudication. Remaining systems are negative.  Physical Exam: Well-developed well-nourished in no acute distress.  Skin is warm and dry.  HEENT is normal.  Neck is supple.  Chest is clear to auscultation with normal expansion.  Cardiovascular exam is regular rate and rhythm.  Abdominal exam nontender or distended. No masses palpated. Extremities show no edema. neuro grossly intact  ECG     This encounter was created in error - please disregard.

## 2016-01-04 ENCOUNTER — Encounter: Payer: Self-pay | Admitting: Cardiology

## 2016-02-17 ENCOUNTER — Other Ambulatory Visit: Payer: Self-pay | Admitting: *Deleted

## 2016-02-17 MED ORDER — POTASSIUM CHLORIDE CRYS ER 20 MEQ PO TBCR: 40.0000 meq | EXTENDED_RELEASE_TABLET | Freq: Every day | ORAL | Status: DC

## 2016-04-18 DIAGNOSIS — I252 Old myocardial infarction: Secondary | ICD-10-CM | POA: Insufficient documentation

## 2016-05-19 ENCOUNTER — Other Ambulatory Visit: Payer: Self-pay | Admitting: Cardiology

## 2016-05-19 MED ORDER — ATORVASTATIN CALCIUM 80 MG PO TABS: 80.0000 mg | ORAL_TABLET | Freq: Every day | ORAL | 3 refills | Status: DC

## 2016-09-22 ENCOUNTER — Other Ambulatory Visit: Payer: Self-pay

## 2016-09-22 DIAGNOSIS — E559 Vitamin D deficiency, unspecified: Secondary | ICD-10-CM | POA: Insufficient documentation

## 2016-09-22 MED ORDER — POTASSIUM CHLORIDE CRYS ER 20 MEQ PO TBCR: 40.0000 meq | EXTENDED_RELEASE_TABLET | Freq: Every day | ORAL | 0 refills | Status: DC

## 2016-10-28 ENCOUNTER — Telehealth: Payer: Self-pay | Admitting: *Deleted

## 2016-10-28 NOTE — Telephone Encounter (Signed)
Left message for pt to call to make an appointment to be seen for refills.

## 2016-11-04 ENCOUNTER — Other Ambulatory Visit: Payer: Self-pay | Admitting: *Deleted

## 2016-11-04 DIAGNOSIS — I25810 Atherosclerosis of coronary artery bypass graft(s) without angina pectoris: Secondary | ICD-10-CM

## 2016-11-07 MED ORDER — FUROSEMIDE 40 MG PO TABS: 120.0000 mg | ORAL_TABLET | Freq: Two times a day (BID) | ORAL | 0 refills | Status: DC

## 2016-11-09 ENCOUNTER — Ambulatory Visit (INDEPENDENT_AMBULATORY_CARE_PROVIDER_SITE_OTHER): Payer: BC Managed Care – PPO | Admitting: Cardiology

## 2016-11-09 ENCOUNTER — Encounter: Payer: Self-pay | Admitting: Cardiology

## 2016-11-09 VITALS — BP 102/60 | HR 82 | Ht 66.0 in | Wt 297.0 lb

## 2016-11-09 DIAGNOSIS — I5032 Chronic diastolic (congestive) heart failure: Secondary | ICD-10-CM

## 2016-11-09 DIAGNOSIS — I25810 Atherosclerosis of coronary artery bypass graft(s) without angina pectoris: Secondary | ICD-10-CM | POA: Diagnosis not present

## 2016-11-09 DIAGNOSIS — I679 Cerebrovascular disease, unspecified: Secondary | ICD-10-CM

## 2016-11-09 MED ORDER — FUROSEMIDE 40 MG PO TABS: 120.0000 mg | ORAL_TABLET | Freq: Two times a day (BID) | ORAL | 3 refills | Status: DC

## 2016-11-09 NOTE — Patient Instructions (Signed)
Medication Instructions:   REFILL FOR FUROSEMIDE SENT TO THE PHARMACY  Labwork:  Your physician recommends that you return for lab work in: ONE WEEK= DO NOT EAT PRIOR TO LAB WORK  Testing/Procedures:  Your physician has requested that you have a carotid duplex. This test is an ultrasound of the carotid arteries in your neck. It looks at blood flow through these arteries that supply the brain with blood. Allow one hour for this exam. There are no restrictions or special instructions.    Follow-Up:  Your physician wants you to follow-up in: ONE YEAR WITH DR Shelda PalRENSHAW You will receive a reminder letter in the mail two months in advance. If you don't receive a letter, please call our office to schedule the follow-up appointment.   If you need a refill on your cardiac medications before your next appointment, please call your pharmacy.

## 2016-11-09 NOTE — Progress Notes (Signed)
HPI: FU CAD and diastolic CHF. She is status post CABG 08/2011.Echocardiogram February 2016 showed normal LV function and grade 2 diastolic dysfunction. Nuclear study March 2016 showed ejection fraction 53%. There were reversible defects in the anterior and inferior basal wall. Treated medically. Carotid Dopplers April 2016 showed 50-69% right and 1-49% left stenosis. Follow-up recommended 6 months. Since she was last seen, she denies increasing dyspnea, chest pain, palpitations or syncope. She has not taken her Lasix in the past 2 days as she ran out.  Current Outpatient Prescriptions  Medication Sig Dispense Refill  . albuterol (PROVENTIL HFA;VENTOLIN HFA) 108 (90 BASE) MCG/ACT inhaler Inhale 1 puff into the lungs every 4 (four) hours as needed for shortness of breath.    [name] albuterol (PROVENTIL HFA;VENTOLIN HFA) 108 (90 BASE) MCG/ACT inhaler Inhale 2 puffs into the lungs every 6 (six) hours as needed for wheezing or shortness of breath. 1 Inhaler 1  . aspirin EC 325 MG EC tablet Take 1 tablet (325 mg total) by mouth daily. 30 tablet   . atorvastatin (LIPITOR) 40 MG tablet Take 1 tablet (40 mg total) by mouth daily. 30 tablet 5  . atorvastatin (LIPITOR) 80 MG tablet Take 1 tablet (80 mg total) by mouth daily. 90 tablet 3  . furosemide (LASIX) 40 MG tablet Take 3 tablets (120 mg total) by mouth 2 (two) times daily. 120 tablet 0  . insulin regular human CONCENTRATED (HUMULIN R) 500 UNIT/ML SOLN injection Inject 8-16 Units into the skin 3 (three) times daily with meals. 20 units in the morning Mid day dose is sliding scale ~12 units in the evening Max 65 units daily    . Liraglutide 18 MG/3ML SOPN Inject 1.8 mg into the skin daily.    . metoprolol succinate (TOPROL-XL) 25 MG 24 hr tablet Take 1 tablet (25 mg total) by mouth daily. 90 tablet 3  . potassium chloride SA (K-DUR,KLOR-CON) 20 MEQ tablet Take 2 tablets (40 mEq total) by mouth daily. NEEDS APPOINTMENT FOR FUTURE REFILLS 60 tablet 0    . VICTOZA 18 MG/3ML SOPN Inject 1.8 mg as directed daily.  4   No current facility-administered medications for this visit.      Past Medical History:  Diagnosis Date  . Asthma   . Asthma   . CAD (coronary artery disease)    a. NSTEMI 12/12 -> s/p CABGx3.   . Carotid artery disease (HCC)    a. Carotid US (08/2013): R 60-79%; L 40-59%; f/u 6 mos.  . Carotid stenosis    a. Carotid US (08/2013):  R 60-79%; L 40-59%; f/u 6 mos  . Chronic diastolic CHF (congestive heart failure) (HCC)    a. Echo 08/2011: mod LVH, nl EF. b. Echo 01/2013: EF 55-60%, mod-sev LVH, mod-sev LA dilitation  . Chronic diastolic heart failure (HCC)    a. Echo 08/2011: mod LVH, nl EF. b. Echo 01/2013: EF 55-60%, mod-sev LVH, mod-sev LA dilitation.  . Complication of anesthesia    '' Myself & my family wake up during surgery "  . Complication of anesthesia   . Coronary artery disease    a. NSTEMI 12/12 -> s/p CABGx3.  [name] Cough   . Cough    a. 2014: Suspect a combination of CHF, GERD and ACE inhibitor-induced cough.  . Diabetes mellitus   . Diabetes mellitus (HCC)   . History of chicken pox   . HLD (hyperlipidemia)   . HTN (hypertension)   . Hyperlipidemia   .  Hypertension   . Morbid obesity (HCC)   . Myocardial infarction   . PONV (postoperative nausea and vomiting)   . S/P CABG (coronary artery bypass graft)    a. 2012 - Dr. Laneta Simmers;  LIMA-LAD, SVG-OM 2, SVG-PDA.  . S/P CABG x 3 09/07/11   Dr. Laneta Simmers;   LIMA-LAD, SVG-OM 2, SVG-PDA.  . Sleep disturbance    a. Had sleep study several years ago, wasn't told she has OSA but told she needs O2 at night.  . Sleep disturbance    Had sleep study several years ago, wasn't told she has OSA but told she needs O2 at night  . Stroke (HCC)   . Stroke [facility]) 2006   denies residual  . TIA (transient ischemic attack)     Past Surgical History:  Procedure Laterality Date  . CHOLECYSTECTOMY    . CHOLECYSTECTOMY  1990's  . CORONARY ARTERY BYPASS GRAFT    .  CORONARY ARTERY BYPASS GRAFT  09/07/2011   CABG X3; Procedure: CORONARY ARTERY BYPASS GRAFTING (CABG);  Surgeon: Alleen Borne, MD;  Location: [facility] OR;  Service: Open Heart Surgery;  Laterality: N/A;  . LEFT HEART CATHETERIZATION WITH CORONARY ANGIOGRAM N/A 09/02/2011   Procedure: LEFT HEART CATHETERIZATION WITH CORONARY ANGIOGRAM;  Surgeon: Herby Abraham, MD;  Location: [facility] CATH LAB;  Service: Cardiovascular;  Laterality: N/A;  . TUBAL LIGATION  1981    Social History   Social History  . Marital status: Married    Spouse name: N/A  . Number of children: 3  . Years of education: 33   Occupational History  . Teacher [employer]   Social History Main Topics  . Smoking status: Former Smoker    Packs/day: 0.50    Years: 25.00    Types: Cigarettes    Quit date: 11/11/2011  . Smokeless tobacco: Former Neurosurgeon    Quit date: 09/19/2012  . Alcohol use No  . Drug use: No  . Sexual activity: No   Other Topics Concern  . Not on file   Social History Narrative   ** Merged History Encounter **       Regular exercise-yes    Family History  Problem Relation Age of Onset  . Cancer Mother   . Heart failure Mother     Also has hx MVP s/p MVR  . Diabetes Mother   . Coronary artery disease Father     Died of MI at age 64  . Heart disease Other     Parent    ROS: no fevers or chills, productive cough, hemoptysis, dysphasia, odynophagia, melena, hematochezia, dysuria, hematuria, rash, seizure activity, orthopnea, PND, claudication. Remaining systems are negative.  Physical Exam: Well-developed obese in no acute distress.  Skin is warm and dry.  HEENT is normal.  Neck is supple.  Chest is clear to auscultation with normal expansion.  Cardiovascular exam is regular rate and rhythm.  Abdominal exam nontender or distended. No masses palpated. Extremities show trace edema. neuro grossly intact  ECG -sinus rhythm with occasional PVC. Left anterior fascicular block. Left  ventricular hypertrophy. Lateral T-wave inversion.  A/P  1 Coronary artery disease-continue aspirin and statin.   2 chronic diastolic congestive heart failure-patient is mildly volume overloaded but has not taken lasix in 2 days; resume previous dose of diuretics. We again discussed importance of fluid restriction and low sodium diet. She will a daily and take an additional 40 mg of Lasix for weight gain of 2-3 pounds.  3 hyperlipidemia-continue  statin.   4 hypertension- blood pressure controlled. Continue present medications.  5 obesity-we discussed importance of weight loss.  6 carotid artery disease-schedule follow-up carotid Dopplers.  Olga MillersBrian Linnae Rasool, MD

## 2016-11-16 NOTE — Telephone Encounter (Signed)
Spoke with Raiann at [facility] lab she states that pt is there for FLP, BMP and hepatic panel. 6 hours ago pt had a glass of Orange juice and a glass of milk and nothing else since.

## 2016-11-16 NOTE — Telephone Encounter (Signed)
This encounter was created in error - please disregard.

## 2016-11-17 ENCOUNTER — Encounter: Payer: Self-pay | Admitting: *Deleted

## 2016-11-17 LAB — BASIC METABOLIC PANEL
BUN: 20 mg/dL (ref 7–25)
CALCIUM: 9.5 mg/dL (ref 8.6–10.4)
CO2: 33 mmol/L — ABNORMAL HIGH (ref 20–31)
Chloride: 101 mmol/L (ref 98–110)
Creat: 1.06 mg/dL — ABNORMAL HIGH (ref 0.50–0.99)
GLUCOSE: 150 mg/dL — AB (ref 65–99)
Potassium: 4.5 mmol/L (ref 3.5–5.3)
SODIUM: 142 mmol/L (ref 135–146)

## 2016-11-17 LAB — HEPATIC FUNCTION PANEL
ALK PHOS: 125 U/L (ref 33–130)
ALT: 17 U/L (ref 6–29)
AST: 21 U/L (ref 10–35)
Albumin: 3.8 g/dL (ref 3.6–5.1)
BILIRUBIN DIRECT: 0.1 mg/dL (ref <=0.2)
BILIRUBIN INDIRECT: 0.3 mg/dL (ref 0.2–1.2)
Total Bilirubin: 0.4 mg/dL (ref 0.2–1.2)
Total Protein: 6.5 g/dL (ref 6.1–8.1)

## 2016-11-17 LAB — LIPID PANEL
CHOL/HDL RATIO: 4 ratio (ref <5.0)
CHOLESTEROL: 132 mg/dL (ref <200)
HDL: 33 mg/dL — ABNORMAL LOW (ref >50)
LDL Cholesterol: 70 mg/dL (ref <100)
Triglycerides: 147 mg/dL (ref <150)
VLDL: 29 mg/dL (ref <30)

## 2016-11-24 ENCOUNTER — Encounter (HOSPITAL_COMMUNITY): Payer: BC Managed Care – PPO

## 2016-11-24 ENCOUNTER — Other Ambulatory Visit: Payer: Self-pay

## 2016-11-24 MED ORDER — POTASSIUM CHLORIDE CRYS ER 20 MEQ PO TBCR: 40.0000 meq | EXTENDED_RELEASE_TABLET | Freq: Every day | ORAL | 3 refills | Status: DC

## 2016-12-20 ENCOUNTER — Inpatient Hospital Stay (HOSPITAL_COMMUNITY): Admission: RE | Admit: 2016-12-20 | Payer: BC Managed Care – PPO | Source: Ambulatory Visit

## 2016-12-28 ENCOUNTER — Ambulatory Visit (HOSPITAL_COMMUNITY)
Admission: RE | Admit: 2016-12-28 | Discharge: 2016-12-28 | Disposition: A | Discharge status: Home / Self Care | Payer: BC Managed Care – PPO | Source: Ambulatory Visit | Attending: Cardiology | Admitting: Cardiology

## 2016-12-28 DIAGNOSIS — I679 Cerebrovascular disease, unspecified: Secondary | ICD-10-CM

## 2016-12-29 ENCOUNTER — Encounter (HOSPITAL_COMMUNITY): Payer: Self-pay | Admitting: Cardiology

## 2016-12-29 NOTE — Progress Notes (Signed)
Progression of RICA stenosis, now in the 80-99% range.  Discussed with DOD Dr. Antoine Poche, who asked to send it to referring MD.  Patient denies cerebrovascular symptoms.

## 2016-12-30 ENCOUNTER — Encounter: Payer: Self-pay | Admitting: *Deleted

## 2016-12-30 ENCOUNTER — Other Ambulatory Visit: Payer: Self-pay | Admitting: *Deleted

## 2016-12-30 DIAGNOSIS — I6521 Occlusion and stenosis of right carotid artery: Secondary | ICD-10-CM

## 2017-01-03 ENCOUNTER — Other Ambulatory Visit: Payer: Self-pay

## 2017-01-03 DIAGNOSIS — I6521 Occlusion and stenosis of right carotid artery: Secondary | ICD-10-CM

## 2017-02-20 ENCOUNTER — Encounter: Payer: Self-pay | Admitting: Vascular Surgery

## 2017-02-20 ENCOUNTER — Encounter: Payer: Self-pay | Admitting: Family

## 2017-02-23 ENCOUNTER — Other Ambulatory Visit: Payer: Self-pay

## 2017-02-23 ENCOUNTER — Ambulatory Visit (INDEPENDENT_AMBULATORY_CARE_PROVIDER_SITE_OTHER): Payer: BC Managed Care – PPO | Admitting: Vascular Surgery

## 2017-02-23 ENCOUNTER — Ambulatory Visit (HOSPITAL_COMMUNITY)
Admission: RE | Admit: 2017-02-23 | Discharge: 2017-02-23 | Disposition: A | Discharge status: Home / Self Care | Payer: BLUE CROSS/BLUE SHIELD | Source: Ambulatory Visit | Attending: Vascular Surgery | Admitting: Vascular Surgery

## 2017-02-23 ENCOUNTER — Encounter: Payer: Self-pay | Admitting: Vascular Surgery

## 2017-02-23 VITALS — BP 119/73 | HR 84 | Temp 97.3°F | Resp 16 | Ht 67.0 in | Wt 295.0 lb

## 2017-02-23 DIAGNOSIS — I6521 Occlusion and stenosis of right carotid artery: Secondary | ICD-10-CM

## 2017-02-23 NOTE — Progress Notes (Signed)
Requesting physician Dr. Jens Som   History of Present Illness:  Patient is a 61 y.o. year old female who presents for evaluation of carotid stenosis.  The patient denies symptoms of TIA, amaurosis, or stroke.  The patient is currently on is antiplatelet therapy of aspirin 325mg .  The carotid stenosis was found on surveillance carotid duplex.  Other medical problems include DM insulin dependent, HTN beta blocker daily, CAD s/p CABG, left hemiparesis age 18 from stroke now completely recovered, and hypercholesterolemia managed with Lipitor daily.  These are currently stable and followed by Dr. Jens Som.  The patient does smoke daily. She was counseled against this today.  Past Medical History:  Diagnosis Date  . Asthma   . Asthma   . CAD (coronary artery disease)    a. NSTEMI 12/12 -> s/p CABGx3.   . Carotid artery disease (HCC)    a. Carotid US (08/2013): R 60-79%; L 40-59%; f/u 6 mos.  . Carotid stenosis    a. Carotid US (08/2013):  R 60-79%; L 40-59%; f/u 6 mos  . Chronic diastolic CHF (congestive heart failure) (HCC)    a. Echo 08/2011: mod LVH, nl EF. b. Echo 01/2013: EF 55-60%, mod-sev LVH, mod-sev LA dilitation  . Chronic diastolic heart failure (HCC)    a. Echo 08/2011: mod LVH, nl EF. b. Echo 01/2013: EF 55-60%, mod-sev LVH, mod-sev LA dilitation.  . Complication of anesthesia    '' Myself & my family wake up during surgery "  . Complication of anesthesia   . Coronary artery disease    a. NSTEMI 12/12 -> s/p CABGx3.  [name] Cough   . Cough    a. 2014: Suspect a combination of CHF, GERD and ACE inhibitor-induced cough.  . Diabetes mellitus   . Diabetes mellitus (HCC)   . History of chicken pox   . HLD (hyperlipidemia)   . HTN (hypertension)   . Hyperlipidemia   . Hypertension   . Morbid obesity (HCC)   . Myocardial infarction (HCC)   . PONV (postoperative nausea and vomiting)   . S/P CABG (coronary artery bypass graft)    a. 2012 - Dr. Laneta Simmers;  LIMA-LAD, SVG-OM 2, SVG-PDA.   . S/P CABG x 3 09/07/11   Dr. Laneta Simmers;   LIMA-LAD, SVG-OM 2, SVG-PDA.  . Sleep disturbance    a. Had sleep study several years ago, wasn't told she has OSA but told she needs O2 at night.  . Sleep disturbance    Had sleep study several years ago, wasn't told she has OSA but told she needs O2 at night  . Stroke (HCC)   . Stroke [facility]) 2006   denies residual  . TIA (transient ischemic attack)     Past Surgical History:  Procedure Laterality Date  . CHOLECYSTECTOMY    . CHOLECYSTECTOMY  1990's  . CORONARY ARTERY BYPASS GRAFT    . CORONARY ARTERY BYPASS GRAFT  09/07/2011   CABG X3; Procedure: CORONARY ARTERY BYPASS GRAFTING (CABG);  Surgeon: Alleen Borne, MD;  Location: [facility] OR;  Service: Open Heart Surgery;  Laterality: N/A;  . LEFT HEART CATHETERIZATION WITH CORONARY ANGIOGRAM N/A 09/02/2011   Procedure: LEFT HEART CATHETERIZATION WITH CORONARY ANGIOGRAM;  Surgeon: Herby Abraham, MD;  Location[facility] C[facility] LAB;  Service: Cardiovascular;  Laterality: N/A;  . TUBAL LIGATION  1981     Social History:  Patient currently smokes half pack cigarettes per day.  Family History Family History  Problem Relation Age of Onset  . Cancer Mother   .  Heart failure Mother        Also has hx MVP s/p MVR  . Diabetes Mother   . Coronary artery disease Father        Died of MI at age 62  . Heart disease Other        Parent    Allergies  Allergies  Allergen Reactions  . Lisinopril Cough and Nausea And Vomiting    ACE Inhibitor Cough ?Cough - changed to ARB 02/2013.  [name]. Other Hives    Unknown IV antibiotic     Current Outpatient Prescriptions  Medication Sig Dispense Refill  . albuterol (PROVENTIL HFA;VENTOLIN HFA) 108 (90 BASE) MCG/ACT inhaler Inhale 2 puffs into the lungs every 6 (six) hours as needed for wheezing or shortness of breath. 1 Inhaler 1  . aspirin EC 325 MG EC tablet Take 1 tablet (325 mg total) by mouth daily. 30 tablet   . atorvastatin (LIPITOR) 40 MG tablet Take 1 tablet  (40 mg total) by mouth daily. 30 tablet 5  . furosemide (LASIX) 40 MG tablet Take 3 tablets (120 mg total) by mouth 2 (two) times daily. 540 tablet 3  . insulin regular human CONCENTRATED (HUMULIN R) 500 UNIT/ML injection INJECT 20 UNITS AT 8AM AND 12 UNITS AT 10PM WITH ADDITIONAL AS NEEDED.  MDD: 65 UNITS/DAY.    [name]. insulin regular human CONCENTRATED (HUMULIN R) 500 UNIT/ML SOLN injection Inject 8-16 Units into the skin 3 (three) times daily with meals. 20 units in the morning Mid day dose is sliding scale ~12 units in the evening Max 65 units daily    . Liraglutide 18 MG/3ML SOPN Inject 1.8 mg into the skin daily.    . metoprolol succinate (TOPROL-XL) 25 MG 24 hr tablet Take 1 tablet (25 mg total) by mouth daily. 90 tablet 3  . potassium chloride SA (K-DUR,KLOR-CON) 20 MEQ tablet Take 2 tablets (40 mEq total) by mouth daily. NEEDS APPOINTMENT FOR FUTURE REFILLS 180 tablet 3  . VICTOZA 18 MG/3ML SOPN Inject 1.8 mg as directed daily.  4  . Vitamin D, Ergocalciferol, (DRISDOL) 50000 units CAPS capsule Take by mouth.     No current facility-administered medications for this visit.     ROS:   General:  No weight loss, Fever, chills  HEENT: No recent headaches, no nasal bleeding, no visual changes, no sore throat  Neurologic: No dizziness, blackouts, seizures. No recent symptoms of stroke or mini- stroke. No recent episodes of slurred speech, or temporary blindness.  Cardiac: No recent episodes of chest pain/pressure, no shortness of breath at rest.  + shortness of breath with exertion.  Denies history of atrial fibrillation or irregular heartbeat  Vascular: No history of rest pain in feet.  No history of claudication.  No history of non-healing ulcer, No history of DVT   Pulmonary: No home oxygen, no productive cough, no hemoptysis,  No asthma or wheezing  Musculoskeletal:  [x ] Arthritis, [ ]  Low back pain,  [ ]  Joint pain  Hematologic:No history of hypercoagulable state.  No history of  easy bleeding.  No history of anemia  Gastrointestinal: No hematochezia or melena,  No gastroesophageal reflux, no trouble swallowing  Urinary: [ ]  chronic Kidney disease, [ ]  on HD - [ ]  MWF or [ ]  TTHS, [ ]  Burning with urination, [ ]  Frequent urination, [ ]  Difficulty urinating;   Skin: No rashes  Psychological: No history of anxiety,  No history of depression   Physical Examination  Vitals:  02/23/17 1345 02/23/17 1350  BP: 109/69 119/73  Pulse: 84 84  Resp: 16   Temp: 97.3 F (36.3 C)   SpO2: 97%   Weight: 295 lb (133.8 kg)   Height: 5\' 7"  (1.702 m)     Body mass index is 46.2 kg/m.  General:  Alert and oriented, no acute distress HEENT: Normal Neck: positive right bruit no JVD Pulmonary: Clear to auscultation bilaterally Cardiac: Regular Rate and Rhythm without murmur Gastrointestinal: Soft, non-tender, non-distended, no mass, obese Skin: No rash Extremity Pulses:  2+ radial, brachial, femoral, dorsalis pedis, pulses bilaterally Musculoskeletal: No deformity or edema  Neurologic: Upper and lower extremity motor 5/5 and symmetric  DATA:  Right carotid duplex with PSV of 363 PICA 80-99 % stenosis Left < 40%, high right carotid bifurcation difficult exam by duplex to determine distal extent of stenosis   ASSESSMENT:  Right carotid stenosis   PLAN: The lesion in the right carotid appears high on the duplex.  We will schedule her for a carotid angiogram June 29th 2018.  The two options are open CEA versus carotid stent.  She will also follow up with her Cardiologist, Dr. Jens Somrenshaw for cardiac risk stratification.  She is a smoker and we advised she stop.    The risk and benefits of these procedures was discussed today with Dr. Darrick PennaFields.   COLLINS, EMMA MAUREEN PA-C [facility]  The patient was seen in conjunction with Fabienne Brunsharles Tiwan Schnitker, MD   History and exam findings as above. Patient has an asymptomatic high-grade greater than  80% right internal carotid artery stenosis. She is currently on Plavix and a statin. She is at risk of stroke. Her carotid duplex scan shows a high bifurcation so we need to further define this preoperatively before considering carotid stenting versus carotid endarterectomy. She is scheduled for arch aortogram carotid angiogram 03/17/2017. Risks benefits possible complications and procedure details including but not limited to bleeding infection contrast reaction stroke risk of 1 out of 100,000 were explained to the patient today and she wishes to proceed. We will get her scheduled for cardiac risk stratification by Dr. Jens Somrenshaw preoperatively.  Fabienne Brunsharles Heena Woodbury, MD [facility] [place] Office: [phone] Pager: [phone]

## 2017-02-23 NOTE — Progress Notes (Signed)
Vitals:   02/23/17 1345  BP: 109/69  Pulse: 84  Resp: 16  Temp: 97.3 F (36.3 C)  SpO2: 97%  Weight: 295 lb (133.8 kg)  Height: 5\' 7"  (1.702 m)

## 2017-03-08 ENCOUNTER — Ambulatory Visit (INDEPENDENT_AMBULATORY_CARE_PROVIDER_SITE_OTHER): Payer: BC Managed Care – PPO | Admitting: Physician Assistant

## 2017-03-08 ENCOUNTER — Encounter: Payer: Self-pay | Admitting: Physician Assistant

## 2017-03-08 VITALS — BP 132/72 | HR 87 | Ht 66.0 in | Wt 302.8 lb

## 2017-03-08 DIAGNOSIS — E785 Hyperlipidemia, unspecified: Secondary | ICD-10-CM | POA: Diagnosis not present

## 2017-03-08 DIAGNOSIS — I6523 Occlusion and stenosis of bilateral carotid arteries: Secondary | ICD-10-CM

## 2017-03-08 DIAGNOSIS — I10 Essential (primary) hypertension: Secondary | ICD-10-CM | POA: Diagnosis not present

## 2017-03-08 DIAGNOSIS — I5032 Chronic diastolic (congestive) heart failure: Secondary | ICD-10-CM | POA: Diagnosis not present

## 2017-03-08 DIAGNOSIS — E119 Type 2 diabetes mellitus without complications: Secondary | ICD-10-CM | POA: Diagnosis not present

## 2017-03-08 DIAGNOSIS — I25810 Atherosclerosis of coronary artery bypass graft(s) without angina pectoris: Secondary | ICD-10-CM | POA: Diagnosis not present

## 2017-03-08 DIAGNOSIS — Z01810 Encounter for preprocedural cardiovascular examination: Secondary | ICD-10-CM

## 2017-03-08 DIAGNOSIS — Z8673 Personal history of transient ischemic attack (TIA), and cerebral infarction without residual deficits: Secondary | ICD-10-CM

## 2017-03-08 DIAGNOSIS — Z794 Long term (current) use of insulin: Secondary | ICD-10-CM | POA: Diagnosis not present

## 2017-03-08 MED ORDER — METOPROLOL SUCCINATE ER 25 MG PO TB24: 12.5000 mg | ORAL_TABLET | Freq: Every day | ORAL | 3 refills | Status: DC

## 2017-03-08 MED ORDER — POTASSIUM CHLORIDE CRYS ER 20 MEQ PO TBCR: 40.0000 meq | EXTENDED_RELEASE_TABLET | Freq: Every day | ORAL | 3 refills | Status: DC

## 2017-03-08 NOTE — Patient Instructions (Signed)
Medication Instructions: START Metoprolol Succinate 12.5 mg daily (half a 25 mg tablet)   Follow-Up: Your physician recommends that you schedule a follow-up appointment in: 3 months with Dr. Jens Somrenshaw.   If you need a refill on your cardiac medications before your next appointment, please call your pharmacy.

## 2017-03-08 NOTE — Progress Notes (Signed)
Cardiology Office Note    Date:  03/09/2017   ID:  Felicia Rivas, DOB Nov 01, 1955, MRN [id number]  PCP:  Patient, No Pcp Per  Cardiologist:  Dr. Jens Som  Chief Complaint  Patient presents with  . Pre-op Exam    requested by Dr. Darrick Penna for carotid angiogram with possible stenting vs carotid endarterectomy    History of Present Illness:  Felicia Rivas is a 61 y.o. female with PMH of CAD s/p CABG 08/2011, carotid artery disease, chronic diastolic heart failure, hypertension, hyperlipidemia, DM 2, history of TIA/CVA and morbid obesity. Her last echocardiogram in February 2016 showed normal LV function, grade 2 diastolic dysfunction. Nuclear study obtained in March 2016 showed EF 53%. There were reversible defect in the anterior and inferior basal wall, this was treated medically. Carotid Doppler in April 2016 showed 50-69% right ICA and a 1-49% left ICA stenosis. This was recently repeated on 12/29/2016 which showed progression of right ICA stenosis to 80-99%. She was recently seen by Dr. Jens Som on 11/09/2016, which time she was doing well. One year follow-up was recommended. She has been evaluated by Dr. Darrick Penna who recommend her to undergo carotid angiogram on 03/17/2017.  She work at [employer]. She denies any significant chest discomfort. She has some mild dyspnea with exertion that is unchanged. She is able to climb at least 2 flight of stairs without any issue. 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 I have discussed this case with DOD Dr. Duke Salvia, given lack of symptom, no further workup is needed. She is cleared to proceed, however due to previous abnormal Myoview, h/o CVA/TIA and h/o CABG, she will be considered at least moderate risk for the intended procedure.    Past Medical History:  Diagnosis Date  . Asthma   . Asthma   . CAD (coronary artery disease)    a. NSTEMI  12/12 -> s/p CABGx3.   . Carotid artery disease (HCC)    a. Carotid US (08/2013): R 60-79%; L 40-59%; f/u 6 mos.  . Carotid stenosis    a. Carotid US (08/2013):  R 60-79%; L 40-59%; f/u 6 mos  . Chronic diastolic CHF (congestive heart failure) (HCC)    a. Echo 08/2011: mod LVH, nl EF. b. Echo 01/2013: EF 55-60%, mod-sev LVH, mod-sev LA dilitation  . Chronic diastolic heart failure (HCC)    a. Echo 08/2011: mod LVH, nl EF. b. Echo 01/2013: EF 55-60%, mod-sev LVH, mod-sev LA dilitation.  . Complication of anesthesia    '' Myself & my family wake up during surgery "  . Complication of anesthesia   . Coronary artery disease    a. NSTEMI 12/12 -> s/p CABGx3.  [name] Cough   . Cough    a. 2014: Suspect a combination of CHF, GERD and ACE inhibitor-induced cough.  . Diabetes mellitus   . Diabetes mellitus (HCC)   . History of chicken pox   . HLD (hyperlipidemia)   . HTN (hypertension)   . Hyperlipidemia   . Hypertension   . Morbid obesity (HCC)   . Myocardial infarction (HCC)   . PONV (postoperative nausea and vomiting)   . S/P CABG (coronary artery bypass graft)    a. 2012 - Dr. Laneta Simmers;  LIMA-LAD, SVG-OM 2, SVG-PDA.  . S/P CABG x 3 09/07/11   Dr. Laneta Simmers;   LIMA-LAD, SVG-OM 2, SVG-PDA.  . Sleep disturbance  a. Had sleep study several years ago, wasn't told she has OSA but told she needs O2 at night.  . Sleep disturbance    Had sleep study several years ago, wasn't told she has OSA but told she needs O2 at night  . Stroke (HCC)   . Stroke [facility](HCC) 2006   denies residual  . TIA (transient ischemic attack)     Past Surgical History:  Procedure Laterality Date  . CHOLECYSTECTOMY    . CHOLECYSTECTOMY  1990's  . CORONARY ARTERY BYPASS GRAFT    . CORONARY ARTERY BYPASS GRAFT  09/07/2011   CABG X3; Procedure: CORONARY ARTERY BYPASS GRAFTING (CABG);  Surgeon: Alleen BorneBryan K Bartle, MD;  [facility];  Service: Open Heart Surgery;  Laterality: N/A;  . LEFT HEART CATHETERIZATION WITH CORONARY  ANGIOGRAM N/A 09/02/2011   Procedure: LEFT HEART CATHETERIZATION WITH CORONARY ANGIOGRAM;  Surgeon: Herby Abrahamhomas D Stuckey, MD;  [facility];  Service: Cardiovascular;  Laterality: N/A;  . TUBAL LIGATION  1981    Current Medications: Outpatient Medications Prior to Visit  Medication Sig Dispense Refill  . albuterol (PROVENTIL HFA;VENTOLIN HFA) 108 (90 BASE) MCG/ACT inhaler Inhale 2 puffs into the lungs every 6 (six) hours as needed for wheezing or shortness of breath. 1 Inhaler 1  . aspirin EC 325 MG EC tablet Take 1 tablet (325 mg total) by mouth daily. 30 tablet   . atorvastatin (LIPITOR) 40 MG tablet Take 1 tablet (40 mg total) by mouth daily. 30 tablet 5  . furosemide (LASIX) 40 MG tablet Take 3 tablets (120 mg total) by mouth 2 (two) times daily. 540 tablet 3  . insulin regular human CONCENTRATED (HUMULIN R) 500 UNIT/ML injection INJECT 20 UNITS AT 8AM AND 12 UNITS AT 10PM WITH ADDITIONAL AS NEEDED.  MDD: 65 UNITS/DAY.    [name]. Liraglutide 18 MG/3ML SOPN Inject 1.8 mg into the skin daily.    . Vitamin D, Ergocalciferol, (DRISDOL) 50000 units CAPS capsule Take by mouth.    . metoprolol succinate (TOPROL-XL) 25 MG 24 hr tablet Take 1 tablet (25 mg total) by mouth daily. 90 tablet 3  . potassium chloride SA (K-DUR,KLOR-CON) 20 MEQ tablet Take 2 tablets (40 mEq total) by mouth daily. NEEDS APPOINTMENT FOR FUTURE REFILLS 180 tablet 3  . insulin regular human CONCENTRATED (HUMULIN R) 500 UNIT/ML SOLN injection Inject 8-16 Units into the skin 3 (three) times daily with meals. 20 units in the morning Mid day dose is sliding scale ~12 units in the evening Max 65 units daily    . VICTOZA 18 MG/3ML SOPN Inject 1.8 mg as directed daily.  4   No facility-administered medications prior to visit.      Allergies:   Lisinopril and Other   Social History   Social History  . Marital status: Married    Spouse name: N/A  . Number of children: 3  . Years of education: 4116   Occupational History  .  Teacher [employer]   Social History Main Topics  . Smoking status: Former Smoker    Packs/day: 0.50    Years: 25.00    Types: Cigarettes    Quit date: 11/11/2011  . Smokeless tobacco: Former NeurosurgeonUser    Quit date: 09/19/2012  . Alcohol use No  . Drug use: No  . Sexual activity: No   Other Topics Concern  . None   Social History Narrative   ** Merged History Encounter **       Regular exercise-yes  Family History:  The patient's family history includes Cancer in her mother; Coronary artery disease in her father; Diabetes in her mother; Heart disease in her other; Heart failure in her mother.   ROS:   Please see the history of present illness.    ROS All other systems reviewed and are negative.   PHYSICAL EXAM:   VS:  BP 132/72   Pulse 87   Ht 5\' 6"  (1.676 m)   Wt (!) 302 lb 12.8 oz (137.3 kg)   BMI 48.87 kg/m    GEN: Well nourished, well developed, in no acute distress  HEENT: normal  Neck: no JVD, carotid bruits, or masses Cardiac: RRR; no murmurs, rubs, or gallops,no edema  Respiratory:  clear to auscultation bilaterally, normal work of breathing GI: soft, nontender, nondistended, + BS MS: no deformity or atrophy  Skin: warm and dry, no rash Neuro:  Alert and Oriented x 3, Strength and sensation are intact Psych: euthymic mood, full affect  Wt Readings from Last 3 Encounters:  03/08/17 (!) 302 lb 12.8 oz (137.3 kg)  02/23/17 295 lb (133.8 kg)  11/09/16 297 lb (134.7 kg)      Studies/Labs Reviewed:   EKG:  EKG is not ordered today.    Recent Labs: 11/16/2016: ALT 17; BUN 20; Creat 1.06; Potassium 4.5; Sodium 142   Lipid Panel    Component Value Date/Time   CHOL 132 11/16/2016 1503   TRIG 147 11/16/2016 1503   HDL 33 (L) 11/16/2016 1503   CHOLHDL 4.0 11/16/2016 1503   VLDL 29 11/16/2016 1503   LDLCALC 70 11/16/2016 1503    Additional studies/ records that were reviewed today include:   Myoview 12/04/2014 Impression Exercise Capacity:   Lexiscan with no exercise. BP Response:  Normal blood pressure response. Clinical Symptoms:  No chest pain. ECG Impression: Electrically negative for ischemia   Comparison with Prior Nuclear Study: No prior study to compare    Overall Impression:  Intermediate risk stress nuclear study with moderate size, medium intensity, reversible defects in the anterior wall and inferior basal wall consistent with ischemia.  LV Ejection Fraction: 53%.  LV Wall Motion:  NL LV Function; NL Wall Motion    Echo 11/11/2014 LV EF: 60% -  65%  ------------------------------------------------------------------- Indications:   Dyspnea 786.09.  ------------------------------------------------------------------- Study Conclusions  - Left ventricle: The cavity size was normal. Wall thickness was normal. Systolic function was normal. The estimated ejection fraction was in the range of 60% to 65%. Wall motion was normal; there were no regional wall motion abnormalities. Features are consistent with a pseudonormal left ventricular filling pattern, with concomitant abnormal relaxation and increased filling pressure (grade 2 diastolic dysfunction).    Carotid US 02/23/2017       ASSESSMENT:    1. Preoperative cardiovascular examination   2. Chronic diastolic heart failure (HCC)   3. Bilateral carotid artery stenosis   4. Coronary artery disease involving coronary bypass graft of native heart without angina pectoris   5. Essential hypertension   6. Hyperlipidemia, unspecified hyperlipidemia type   7. Controlled type 2 diabetes mellitus without complication, with long-term current use of insulin (HCC)   8. H/O: CVA (cerebrovascular accident)   9. Morbid obesity (HCC)      PLAN:  In order of problems listed above:  1. Preoperative assessment: She had a history of abnormal Myoview in 2016, however she was medically managed due to lack of symptoms. She continued to walk at least  a 1000 yards back and  forth multiple times throughout the day without any discomfort. She is able to climb at least 2 flight of stairs without any chest pain. She is able to complete 4 METS without any issue. She is cleared from cardiology perspective to proceed with surgery. I have discussed the case with Dr. Duke Salvia DOD who also agrees no further workup at this time is needed. However given her history of prior surgery, CVA, diabetes, and abnormal Myoview, she will be at least a moderate risk patient for the intended to procedure.  2. Carotid artery stenosis: Plan for carotid angiogram with possible stenting versus carotid endarterectomy by Dr. Darrick Penna, 80-99% stenosis of the right carotid artery seen on recent carotid ultrasound  3. CAD s/p CABG: She has not been taking her Toprol-XL, I will restart the Toprol succinate at 12.5 mg daily. Otherwise she has no angina despite abnormal Myoview in March 2016.  4. HTN: Blood pressure mildly elevated today, for some reason she is no longer taking Toprol-XL, I will restart at 12.5 mg daily.  5. DM 2: On insulin  6. History of CVA: No recurrence    Medication Adjustments/Labs and Tests Ordered: Current medicines are reviewed at length with the patient today.  Concerns regarding medicines are outlined above.  Medication changes, Labs and Tests ordered today are listed in the Patient Instructions below. Patient Instructions  Medication Instructions: START Metoprolol Succinate 12.5 mg daily (half a 25 mg tablet)   Follow-Up: Your physician recommends that you schedule a follow-up appointment in: 3 months with Dr. Jens Som.   If you need a refill on your cardiac medications before your next appointment, please call your pharmacy.      Ramond Dial, [place]  03/09/2017 7:06 PM    [facility] [address] [place], [place], [place]  [address] Phone: [phone]; Fax: [phone]

## 2017-03-09 ENCOUNTER — Encounter: Payer: Self-pay | Admitting: Physician Assistant

## 2017-03-10 ENCOUNTER — Encounter: Payer: Self-pay | Admitting: Vascular Surgery

## 2017-03-17 ENCOUNTER — Ambulatory Visit (HOSPITAL_COMMUNITY)
Admission: RE | Admit: 2017-03-17 | Discharge: 2017-03-17 | Disposition: A | Discharge status: Home / Self Care | Payer: BC Managed Care – PPO | Source: Ambulatory Visit | Attending: Vascular Surgery | Admitting: Vascular Surgery

## 2017-03-17 ENCOUNTER — Telehealth: Payer: Self-pay | Admitting: Physician Assistant

## 2017-03-17 ENCOUNTER — Inpatient Hospital Stay (HOSPITAL_COMMUNITY)
Admission: RE | Disposition: A | Discharge status: Home / Self Care | Payer: Self-pay | Source: Ambulatory Visit | Attending: Vascular Surgery

## 2017-03-17 DIAGNOSIS — Z951 Presence of aortocoronary bypass graft: Secondary | ICD-10-CM | POA: Insufficient documentation

## 2017-03-17 DIAGNOSIS — Z794 Long term (current) use of insulin: Secondary | ICD-10-CM | POA: Insufficient documentation

## 2017-03-17 DIAGNOSIS — I6521 Occlusion and stenosis of right carotid artery: Secondary | ICD-10-CM | POA: Insufficient documentation

## 2017-03-17 DIAGNOSIS — E7800 Pure hypercholesterolemia, unspecified: Secondary | ICD-10-CM | POA: Diagnosis not present

## 2017-03-17 DIAGNOSIS — Z7982 Long term (current) use of aspirin: Secondary | ICD-10-CM | POA: Insufficient documentation

## 2017-03-17 DIAGNOSIS — J45909 Unspecified asthma, uncomplicated: Secondary | ICD-10-CM | POA: Diagnosis not present

## 2017-03-17 DIAGNOSIS — F17210 Nicotine dependence, cigarettes, uncomplicated: Secondary | ICD-10-CM | POA: Insufficient documentation

## 2017-03-17 DIAGNOSIS — G4730 Sleep apnea, unspecified: Secondary | ICD-10-CM | POA: Diagnosis not present

## 2017-03-17 DIAGNOSIS — I69354 Hemiplegia and hemiparesis following cerebral infarction affecting left non-dominant side: Secondary | ICD-10-CM | POA: Insufficient documentation

## 2017-03-17 DIAGNOSIS — I2510 Atherosclerotic heart disease of native coronary artery without angina pectoris: Secondary | ICD-10-CM | POA: Diagnosis not present

## 2017-03-17 DIAGNOSIS — I5032 Chronic diastolic (congestive) heart failure: Secondary | ICD-10-CM

## 2017-03-17 DIAGNOSIS — I252 Old myocardial infarction: Secondary | ICD-10-CM | POA: Diagnosis not present

## 2017-03-17 DIAGNOSIS — E119 Type 2 diabetes mellitus without complications: Secondary | ICD-10-CM | POA: Insufficient documentation

## 2017-03-17 DIAGNOSIS — I110 Hypertensive heart disease with heart failure: Secondary | ICD-10-CM | POA: Insufficient documentation

## 2017-03-17 DIAGNOSIS — Z6841 Body Mass Index (BMI) 40.0 and over, adult: Secondary | ICD-10-CM | POA: Insufficient documentation

## 2017-03-17 DIAGNOSIS — I6522 Occlusion and stenosis of left carotid artery: Secondary | ICD-10-CM | POA: Diagnosis not present

## 2017-03-17 HISTORY — PX: CAROTID ANGIOGRAPHY: CATH118230

## 2017-03-17 LAB — POCT I-STAT, CHEM 8
BUN: 13 mg/dL (ref 6–20)
CREATININE: 0.8 mg/dL (ref 0.44–1.00)
Calcium, Ion: 1.14 mmol/L — ABNORMAL LOW (ref 1.15–1.40)
Chloride: 103 mmol/L (ref 101–111)
Glucose, Bld: 88 mg/dL (ref 65–99)
HCT: 42 % (ref 36.0–46.0)
HEMOGLOBIN: 14.3 g/dL (ref 12.0–15.0)
POTASSIUM: 3.2 mmol/L — AB (ref 3.5–5.1)
SODIUM: 142 mmol/L (ref 135–145)
TCO2: 27 mmol/L (ref 0–100)

## 2017-03-17 LAB — GLUCOSE, CAPILLARY
GLUCOSE-CAPILLARY: 83 mg/dL (ref 65–99)
Glucose-Capillary: 110 mg/dL — ABNORMAL HIGH (ref 65–99)

## 2017-03-17 SURGERY — CAROTID ANGIOGRAPHY
Anesthesia: LOCAL

## 2017-03-17 MED ORDER — GUAIFENESIN-DM 100-10 MG/5ML PO SYRP: 15.0000 mL | ORAL_SOLUTION | ORAL | Status: DC | PRN

## 2017-03-17 MED ORDER — PHENOL 1.4 % MT LIQD: 1.0000 | OROMUCOSAL | Status: DC | PRN

## 2017-03-17 MED ORDER — SODIUM CHLORIDE 0.45 % IV SOLN
INTRAVENOUS | Status: DC
  Administered 2017-03-17: 100 mL/h via INTRAVENOUS

## 2017-03-17 MED ORDER — ONDANSETRON HCL 4 MG/2ML IJ SOLN: 4.0000 mg | Freq: Four times a day (QID) | INTRAMUSCULAR | Status: DC | PRN

## 2017-03-17 MED ORDER — OXYCODONE-ACETAMINOPHEN 5-325 MG PO TABS: 1.0000 | ORAL_TABLET | ORAL | Status: DC | PRN

## 2017-03-17 MED ORDER — ACETAMINOPHEN 325 MG PO TABS: 325.0000 mg | ORAL_TABLET | ORAL | Status: DC | PRN

## 2017-03-17 MED ORDER — MORPHINE SULFATE (PF) 10 MG/ML IV SOLN: 2.0000 mg | INTRAVENOUS | Status: DC | PRN

## 2017-03-17 MED ORDER — HEPARIN (PORCINE) IN NACL 2-0.9 UNIT/ML-% IJ SOLN
INTRAMUSCULAR | Status: AC
  Filled 2017-03-17: qty 1000

## 2017-03-17 MED ORDER — SODIUM CHLORIDE 0.9 % IV SOLN
INTRAVENOUS | Status: DC
  Administered 2017-03-17: 09:00:00 via INTRAVENOUS

## 2017-03-17 MED ORDER — METOPROLOL TARTRATE 5 MG/5ML IV SOLN: 2.0000 mg | INTRAVENOUS | Status: DC | PRN

## 2017-03-17 MED ORDER — LIDOCAINE HCL (PF) 1 % IJ SOLN
INTRAMUSCULAR | Status: AC
  Filled 2017-03-17: qty 30

## 2017-03-17 MED ORDER — IODIXANOL 320 MG/ML IV SOLN
INTRAVENOUS | Status: DC | PRN
  Administered 2017-03-17: 165 mL via INTRA_ARTERIAL

## 2017-03-17 MED ORDER — ACETAMINOPHEN 325 MG RE SUPP: 325.0000 mg | RECTAL | Status: DC | PRN

## 2017-03-17 MED ORDER — ALUM & MAG HYDROXIDE-SIMETH 200-200-20 MG/5ML PO SUSP: 15.0000 mL | ORAL | Status: DC | PRN

## 2017-03-17 MED ORDER — METOPROLOL SUCCINATE ER 25 MG PO TB24: 12.5000 mg | ORAL_TABLET | Freq: Every day | ORAL | 3 refills | Status: DC

## 2017-03-17 MED ORDER — HYDRALAZINE HCL 20 MG/ML IJ SOLN: 5.0000 mg | INTRAMUSCULAR | Status: DC | PRN

## 2017-03-17 MED ORDER — HEPARIN (PORCINE) IN NACL 2-0.9 UNIT/ML-% IJ SOLN
INTRAMUSCULAR | Status: AC | PRN
  Administered 2017-03-17: 1000 mL

## 2017-03-17 MED ORDER — LIDOCAINE HCL (PF) 1 % IJ SOLN
INTRAMUSCULAR | Status: DC | PRN
  Administered 2017-03-17: 15 mL via INTRADERMAL

## 2017-03-17 MED ORDER — CLOPIDOGREL BISULFATE 75 MG PO TABS: 75.0000 mg | ORAL_TABLET | Freq: Every day | ORAL | 11 refills | Status: DC

## 2017-03-17 MED ORDER — LABETALOL HCL 5 MG/ML IV SOLN: 10.0000 mg | INTRAVENOUS | Status: DC | PRN

## 2017-03-17 MED ORDER — POTASSIUM CHLORIDE CRYS ER 20 MEQ PO TBCR: 40.0000 meq | EXTENDED_RELEASE_TABLET | Freq: Every day | ORAL | 3 refills | Status: DC

## 2017-03-17 SURGICAL SUPPLY — 13 items
CATH ANGIO 5F BER2 100CM (CATHETERS) ×2 IMPLANT
CATH ANGIO 5F PIGTAIL 100CM (CATHETERS) ×2 IMPLANT
CATH ANGIO 5F SIM1 100CM (CATHETERS) ×2 IMPLANT
CATH HEADHUNTER H1 5F 100CM (CATHETERS) ×2 IMPLANT
KIT MICROINTRODUCER STIFF 5F (SHEATH) ×2 IMPLANT
KIT PV (KITS) ×2 IMPLANT
SHEATH PINNACLE 5F 10CM (SHEATH) ×2 IMPLANT
STOPCOCK MORSE 400PSI 3WAY (MISCELLANEOUS) ×2 IMPLANT
SYR MEDRAD MARK V 150ML (SYRINGE) ×2 IMPLANT
TRANSDUCER W/STOPCOCK (MISCELLANEOUS) ×2 IMPLANT
TRAY PV CATH (CUSTOM PROCEDURE TRAY) ×2 IMPLANT
WIRE HITORQ VERSACORE ST 145CM (WIRE) ×2 IMPLANT
WIRE TORQFLEX AUST .018X40CM (WIRE) ×2 IMPLANT

## 2017-03-17 NOTE — H&P (View-Only) (Signed)
Requesting physician Dr. Jens Som   History of Present Illness:  Patient is a 61 y.o. year old female who presents for evaluation of carotid stenosis.  The patient denies symptoms of TIA, amaurosis, or stroke.  The patient is currently on is antiplatelet therapy of aspirin 325mg .  The carotid stenosis was found on surveillance carotid duplex.  Other medical problems include DM insulin dependent, HTN beta blocker daily, CAD s/p CABG, left hemiparesis age 18 from stroke now completely recovered, and hypercholesterolemia managed with Lipitor daily.  These are currently stable and followed by Dr. Jens Som.  The patient does smoke daily. She was counseled against this today.  Past Medical History:  Diagnosis Date  . Asthma   . Asthma   . CAD (coronary artery disease)    a. NSTEMI 12/12 -> s/p CABGx3.   . Carotid artery disease (HCC)    a. Carotid US (08/2013): R 60-79%; L 40-59%; f/u 6 mos.  . Carotid stenosis    a. Carotid US (08/2013):  R 60-79%; L 40-59%; f/u 6 mos  . Chronic diastolic CHF (congestive heart failure) (HCC)    a. Echo 08/2011: mod LVH, nl EF. b. Echo 01/2013: EF 55-60%, mod-sev LVH, mod-sev LA dilitation  . Chronic diastolic heart failure (HCC)    a. Echo 08/2011: mod LVH, nl EF. b. Echo 01/2013: EF 55-60%, mod-sev LVH, mod-sev LA dilitation.  . Complication of anesthesia    '' Myself & my family wake up during surgery "  . Complication of anesthesia   . Coronary artery disease    a. NSTEMI 12/12 -> s/p CABGx3.  [name] Cough   . Cough    a. 2014: Suspect a combination of CHF, GERD and ACE inhibitor-induced cough.  . Diabetes mellitus   . Diabetes mellitus (HCC)   . History of chicken pox   . HLD (hyperlipidemia)   . HTN (hypertension)   . Hyperlipidemia   . Hypertension   . Morbid obesity (HCC)   . Myocardial infarction (HCC)   . PONV (postoperative nausea and vomiting)   . S/P CABG (coronary artery bypass graft)    a. 2012 - Dr. Laneta Simmers;  LIMA-LAD, SVG-OM 2, SVG-PDA.   . S/P CABG x 3 09/07/11   Dr. Laneta Simmers;   LIMA-LAD, SVG-OM 2, SVG-PDA.  . Sleep disturbance    a. Had sleep study several years ago, wasn't told she has OSA but told she needs O2 at night.  . Sleep disturbance    Had sleep study several years ago, wasn't told she has OSA but told she needs O2 at night  . Stroke (HCC)   . Stroke [facility]) 2006   denies residual  . TIA (transient ischemic attack)     Past Surgical History:  Procedure Laterality Date  . CHOLECYSTECTOMY    . CHOLECYSTECTOMY  1990's  . CORONARY ARTERY BYPASS GRAFT    . CORONARY ARTERY BYPASS GRAFT  09/07/2011   CABG X3; Procedure: CORONARY ARTERY BYPASS GRAFTING (CABG);  Surgeon: Alleen Borne, MD;  Location: [facility] OR;  Service: Open Heart Surgery;  Laterality: N/A;  . LEFT HEART CATHETERIZATION WITH CORONARY ANGIOGRAM N/A 09/02/2011   Procedure: LEFT HEART CATHETERIZATION WITH CORONARY ANGIOGRAM;  Surgeon: Herby Abraham, MD;  [facility];  Service: Cardiovascular;  Laterality: N/A;  . TUBAL LIGATION  1981     Social History:  Patient currently smokes half pack cigarettes per day.  Family History Family History  Problem Relation Age of Onset  . Cancer Mother   .  Heart failure Mother        Also has hx MVP s/p MVR  . Diabetes Mother   . Coronary artery disease Father        Died of MI at age 62  . Heart disease Other        Parent    Allergies  Allergies  Allergen Reactions  . Lisinopril Cough and Nausea And Vomiting    ACE Inhibitor Cough ?Cough - changed to ARB 02/2013.  [name]. Other Hives    Unknown IV antibiotic     Current Outpatient Prescriptions  Medication Sig Dispense Refill  . albuterol (PROVENTIL HFA;VENTOLIN HFA) 108 (90 BASE) MCG/ACT inhaler Inhale 2 puffs into the lungs every 6 (six) hours as needed for wheezing or shortness of breath. 1 Inhaler 1  . aspirin EC 325 MG EC tablet Take 1 tablet (325 mg total) by mouth daily. 30 tablet   . atorvastatin (LIPITOR) 40 MG tablet Take 1 tablet  (40 mg total) by mouth daily. 30 tablet 5  . furosemide (LASIX) 40 MG tablet Take 3 tablets (120 mg total) by mouth 2 (two) times daily. 540 tablet 3  . insulin regular human CONCENTRATED (HUMULIN R) 500 UNIT/ML injection INJECT 20 UNITS AT 8AM AND 12 UNITS AT 10PM WITH ADDITIONAL AS NEEDED.  MDD: 65 UNITS/DAY.    [name]. insulin regular human CONCENTRATED (HUMULIN R) 500 UNIT/ML SOLN injection Inject 8-16 Units into the skin 3 (three) times daily with meals. 20 units in the morning Mid day dose is sliding scale ~12 units in the evening Max 65 units daily    . Liraglutide 18 MG/3ML SOPN Inject 1.8 mg into the skin daily.    . metoprolol succinate (TOPROL-XL) 25 MG 24 hr tablet Take 1 tablet (25 mg total) by mouth daily. 90 tablet 3  . potassium chloride SA (K-DUR,KLOR-CON) 20 MEQ tablet Take 2 tablets (40 mEq total) by mouth daily. NEEDS APPOINTMENT FOR FUTURE REFILLS 180 tablet 3  . VICTOZA 18 MG/3ML SOPN Inject 1.8 mg as directed daily.  4  . Vitamin D, Ergocalciferol, (DRISDOL) 50000 units CAPS capsule Take by mouth.     No current facility-administered medications for this visit.     ROS:   General:  No weight loss, Fever, chills  HEENT: No recent headaches, no nasal bleeding, no visual changes, no sore throat  Neurologic: No dizziness, blackouts, seizures. No recent symptoms of stroke or mini- stroke. No recent episodes of slurred speech, or temporary blindness.  Cardiac: No recent episodes of chest pain/pressure, no shortness of breath at rest.  + shortness of breath with exertion.  Denies history of atrial fibrillation or irregular heartbeat  Vascular: No history of rest pain in feet.  No history of claudication.  No history of non-healing ulcer, No history of DVT   Pulmonary: No home oxygen, no productive cough, no hemoptysis,  No asthma or wheezing  Musculoskeletal:  [x ] Arthritis, [ ]  Low back pain,  [ ]  Joint pain  Hematologic:No history of hypercoagulable state.  No history of  easy bleeding.  No history of anemia  Gastrointestinal: No hematochezia or melena,  No gastroesophageal reflux, no trouble swallowing  Urinary: [ ]  chronic Kidney disease, [ ]  on HD - [ ]  MWF or [ ]  TTHS, [ ]  Burning with urination, [ ]  Frequent urination, [ ]  Difficulty urinating;   Skin: No rashes  Psychological: No history of anxiety,  No history of depression   Physical Examination  Vitals:  02/23/17 1345 02/23/17 1350  BP: 109/69 119/73  Pulse: 84 84  Resp: 16   Temp: 97.3 F (36.3 C)   SpO2: 97%   Weight: 295 lb (133.8 kg)   Height: 5\' 7"  (1.702 m)     Body mass index is 46.2 kg/m.  General:  Alert and oriented, no acute distress HEENT: Normal Neck: positive right bruit no JVD Pulmonary: Clear to auscultation bilaterally Cardiac: Regular Rate and Rhythm without murmur Gastrointestinal: Soft, non-tender, non-distended, no mass, obese Skin: No rash Extremity Pulses:  2+ radial, brachial, femoral, dorsalis pedis, pulses bilaterally Musculoskeletal: No deformity or edema  Neurologic: Upper and lower extremity motor 5/5 and symmetric  DATA:  Right carotid duplex with PSV of 363 PICA 80-99 % stenosis Left < 40%, high right carotid bifurcation difficult exam by duplex to determine distal extent of stenosis   ASSESSMENT:  Right carotid stenosis   PLAN: The lesion in the right carotid appears high on the duplex.  We will schedule her for a carotid angiogram June 29th 2018.  The two options are open CEA versus carotid stent.  She will also follow up with her Cardiologist, Dr. Jens Somrenshaw for cardiac risk stratification.  She is a smoker and we advised she stop.    The risk and benefits of these procedures was discussed today with Dr. Darrick PennaFields.   COLLINS, EMMA MAUREEN PA-C [facility]  The patient was seen in conjunction with Fabienne Brunsharles Javante Nilsson, MD   History and exam findings as above. Patient has an asymptomatic high-grade greater than  80% right internal carotid artery stenosis. She is currently on Plavix and a statin. She is at risk of stroke. Her carotid duplex scan shows a high bifurcation so we need to further define this preoperatively before considering carotid stenting versus carotid endarterectomy. She is scheduled for arch aortogram carotid angiogram 03/17/2017. Risks benefits possible complications and procedure details including but not limited to bleeding infection contrast reaction stroke risk of 1 out of 100,000 were explained to the patient today and she wishes to proceed. We will get her scheduled for cardiac risk stratification by Dr. Jens Somrenshaw preoperatively.  Fabienne Brunsharles Larah Kuntzman, MD [facility] [place] Office: [phone] Pager: [phone]

## 2017-03-17 NOTE — Interval H&P Note (Signed)
History and Physical Interval Note:  03/17/2017 9:13 AM  Felicia Rivas  has presented today for surgery, with the diagnosis of right carotid stenosis  The various methods of treatment have been discussed with the patient and family. After consideration of risks, benefits and other options for treatment, the patient has consented to  Procedure(s): Carotid Angiography (N/A) as a surgical intervention .  The patient's history has been reviewed, patient examined, no change in status, stable for surgery.  I have reviewed the patient's chart and labs.  Questions were answered to the patient's satisfaction.     Fabienne BrunsFields, Ameya Kutz

## 2017-03-17 NOTE — Telephone Encounter (Signed)
Pt states that refills were sent to the wrong pharmacy  I will resend to correct pharmacy

## 2017-03-17 NOTE — Op Note (Addendum)
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 The patient was placed in supine position on the Angio table. Both groins were prepped and draped in usual sterile fashion. Local anesthesia was insured of the right common femoral artery. Ultrasound was used to identify the right common femoral artery and a micropuncture needle was used to cannulate the right common femoral artery without difficulty. Micropuncture needle was advanced in the right external iliac artery. Micropuncture sheath was then placed over this. Micro-puncture wire and dilator were removed and an 85 versacore wire advanced into the abdominal aorta under fluoroscopic guidance. Micropuncture sheath was then removed and a 5 French straight sheath placed over this and flushed thoroughly with heparinized saline. A 5 French pigtail catheter was then advanced over the guidewire up in the aortic arch and ascending aorta. Arch aortogram was then obtained in a 40 LAO projection. Shows normal aortic arch anatomy the innominate left common and left subclavian arteries are widely patent. Next the pectoral catheter was pulled back over guidewire exchange for a 5 [place] Berenstein T catheter was advanced into the right innominate artery and up into the right common carotid artery. Briefly the right vertebral artery was inadvertently catheterize the catheter was pulled back out of this. Right common carotid artery injection was performed with intracranial views performed for interpretation by the neuroradiologist. AP  and lateral views of the right common carotid artery shows a greater than 80% irregular moderately calcified stenosis extending up to the level of the mandible with calcified disease extending up to the level of the C2 vertebral body. The external carotid artery also has a high-grade greater than 90% stenosis. At this point the catheter was pulled back over guidewire and attempts were made using the Berenstein catheter to selectively catheterize the left common carotid artery. This was unsuccessful so this was exchanged initially for an H1 catheter and then finally for a Simmons catheter. Simmons catheter was used to selectively catheterize the left common carotid artery and again AP and lateral edge cramp views were performed for interpretation by the neuroradiologist. AP and lateral and oblique views of left carotid system were performed which shows a widely patent left internal and external carotid artery with no significant stenosis.  At this point the [facility] catheter was pulled back over guidewire and removed. A 5 French sheath was left in place to be pulled in the holding area. The patient tolerated procedure well and there were no complications. The patient was taken to the holding area in stable condition.  Operative management: The patient will be scheduled in the near future on 03/29/2017 for transplant [place] right carotid stenting due to anatomic high risk the high level of the lesion area the patient will be started on Plavix in addition to her current aspirin today. Risks benefits possible palpitations and procedure details including but not limited to bleeding infection stroke risk of 3-5% were discussed with the patient and her daughter today.  Fabienne Brunsharles Shia Eber, MD [facility] [place] Office: [phone] Pager: [phone]

## 2017-03-17 NOTE — Telephone Encounter (Signed)
New Message     Pt is still waiting on blood pressure medication to be called in, she was suppose to have potassium and a new blood pressure medication but does remember the name??  Please use [facility] in [place]

## 2017-03-17 NOTE — Discharge Instructions (Signed)

## 2017-03-17 NOTE — Progress Notes (Signed)
Site area: Right groin a 5 french arterial sheath was removed  Site Prior to Removal:  Level 0  Pressure Applied For 20 MINUTES    Bedrest Beginning at 1210p  Manual:   Yes.    Patient Status During Pull:  stable  Post Pull Groin Site:  Level 0  Post Pull Instructions Given:  Yes.    Post Pull Pulses Present:  Yes.    Dressing Applied:  Yes.    Comments:  VS remain stable 

## 2017-03-20 ENCOUNTER — Encounter (HOSPITAL_COMMUNITY): Payer: Self-pay | Admitting: Vascular Surgery

## 2017-03-21 ENCOUNTER — Other Ambulatory Visit: Payer: Self-pay

## 2017-03-29 ENCOUNTER — Encounter (HOSPITAL_COMMUNITY)
Admission: RE | Disposition: A | Discharge status: Home / Self Care | Payer: Self-pay | Source: Ambulatory Visit | Attending: Vascular Surgery

## 2017-03-29 ENCOUNTER — Inpatient Hospital Stay (HOSPITAL_COMMUNITY)
Admission: RE | Admit: 2017-03-29 | Discharge: 2017-03-30 | DRG: 035 | Disposition: A | Discharge status: Home / Self Care | Payer: BC Managed Care – PPO | Source: Ambulatory Visit | Attending: Vascular Surgery | Admitting: Vascular Surgery

## 2017-03-29 ENCOUNTER — Encounter (HOSPITAL_COMMUNITY): Payer: Self-pay | Admitting: Vascular Surgery

## 2017-03-29 DIAGNOSIS — Z8249 Family history of ischemic heart disease and other diseases of the circulatory system: Secondary | ICD-10-CM | POA: Diagnosis not present

## 2017-03-29 DIAGNOSIS — R001 Bradycardia, unspecified: Secondary | ICD-10-CM | POA: Diagnosis not present

## 2017-03-29 DIAGNOSIS — I5032 Chronic diastolic (congestive) heart failure: Secondary | ICD-10-CM | POA: Diagnosis present

## 2017-03-29 DIAGNOSIS — Z951 Presence of aortocoronary bypass graft: Secondary | ICD-10-CM | POA: Diagnosis not present

## 2017-03-29 DIAGNOSIS — Z79899 Other long term (current) drug therapy: Secondary | ICD-10-CM

## 2017-03-29 DIAGNOSIS — I2510 Atherosclerotic heart disease of native coronary artery without angina pectoris: Secondary | ICD-10-CM | POA: Diagnosis present

## 2017-03-29 DIAGNOSIS — I6529 Occlusion and stenosis of unspecified carotid artery: Secondary | ICD-10-CM | POA: Diagnosis present

## 2017-03-29 DIAGNOSIS — E7800 Pure hypercholesterolemia, unspecified: Secondary | ICD-10-CM | POA: Diagnosis present

## 2017-03-29 DIAGNOSIS — J45909 Unspecified asthma, uncomplicated: Secondary | ICD-10-CM | POA: Diagnosis present

## 2017-03-29 DIAGNOSIS — Z888 Allergy status to other drugs, medicaments and biological substances status: Secondary | ICD-10-CM

## 2017-03-29 DIAGNOSIS — Z7982 Long term (current) use of aspirin: Secondary | ICD-10-CM | POA: Diagnosis not present

## 2017-03-29 DIAGNOSIS — I110 Hypertensive heart disease with heart failure: Secondary | ICD-10-CM | POA: Diagnosis present

## 2017-03-29 DIAGNOSIS — I252 Old myocardial infarction: Secondary | ICD-10-CM

## 2017-03-29 DIAGNOSIS — Z794 Long term (current) use of insulin: Secondary | ICD-10-CM

## 2017-03-29 DIAGNOSIS — F17210 Nicotine dependence, cigarettes, uncomplicated: Secondary | ICD-10-CM | POA: Diagnosis present

## 2017-03-29 DIAGNOSIS — I6521 Occlusion and stenosis of right carotid artery: Secondary | ICD-10-CM

## 2017-03-29 DIAGNOSIS — E785 Hyperlipidemia, unspecified: Secondary | ICD-10-CM | POA: Diagnosis present

## 2017-03-29 DIAGNOSIS — Z8673 Personal history of transient ischemic attack (TIA), and cerebral infarction without residual deficits: Secondary | ICD-10-CM | POA: Diagnosis not present

## 2017-03-29 DIAGNOSIS — E119 Type 2 diabetes mellitus without complications: Secondary | ICD-10-CM | POA: Diagnosis present

## 2017-03-29 HISTORY — PX: CAROTID PTA/STENT INTERVENTION: CATH118231

## 2017-03-29 LAB — CBC
HEMATOCRIT: 36.5 % (ref 36.0–46.0)
HEMOGLOBIN: 12 g/dL (ref 12.0–15.0)
MCH: 29.9 pg (ref 26.0–34.0)
MCHC: 32.9 g/dL (ref 30.0–36.0)
MCV: 91 fL (ref 78.0–100.0)
Platelets: 223 10*3/uL (ref 150–400)
RBC: 4.01 MIL/uL (ref 3.87–5.11)
RDW: 14 % (ref 11.5–15.5)
WBC: 9.7 10*3/uL (ref 4.0–10.5)

## 2017-03-29 LAB — GLUCOSE, CAPILLARY
GLUCOSE-CAPILLARY: 181 mg/dL — AB (ref 65–99)
GLUCOSE-CAPILLARY: 206 mg/dL — AB (ref 65–99)
GLUCOSE-CAPILLARY: 219 mg/dL — AB (ref 65–99)
GLUCOSE-CAPILLARY: 280 mg/dL — AB (ref 65–99)

## 2017-03-29 LAB — CREATININE, SERUM
Creatinine, Ser: 1.1 mg/dL — ABNORMAL HIGH (ref 0.44–1.00)
GFR, EST NON AFRICAN AMERICAN: 53 mL/min — AB (ref >60)

## 2017-03-29 LAB — POCT I-STAT, CHEM 8
BUN: 37 mg/dL — ABNORMAL HIGH (ref 6–20)
Calcium, Ion: 1.17 mmol/L (ref 1.15–1.40)
Chloride: 100 mmol/L — ABNORMAL LOW (ref 101–111)
Creatinine, Ser: 1 mg/dL (ref 0.44–1.00)
Glucose, Bld: 183 mg/dL — ABNORMAL HIGH (ref 65–99)
HEMATOCRIT: 40 % (ref 36.0–46.0)
HEMOGLOBIN: 13.6 g/dL (ref 12.0–15.0)
POTASSIUM: 4.5 mmol/L (ref 3.5–5.1)
Sodium: 138 mmol/L (ref 135–145)
TCO2: 30 mmol/L (ref 0–100)

## 2017-03-29 LAB — POCT ACTIVATED CLOTTING TIME: ACTIVATED CLOTTING TIME: 450 s

## 2017-03-29 SURGERY — CAROTID PTA/STENT INTERVENTION
Anesthesia: LOCAL | Laterality: Right

## 2017-03-29 MED ORDER — HEPARIN (PORCINE) IN NACL 2-0.9 UNIT/ML-% IJ SOLN
INTRAMUSCULAR | Status: AC
  Filled 2017-03-29: qty 1000

## 2017-03-29 MED ORDER — ACETAMINOPHEN 650 MG RE SUPP: 325.0000 mg | RECTAL | Status: DC | PRN

## 2017-03-29 MED ORDER — ALBUTEROL SULFATE HFA 108 (90 BASE) MCG/ACT IN AERS: 2.0000 | INHALATION_SPRAY | Freq: Four times a day (QID) | RESPIRATORY_TRACT | Status: DC | PRN

## 2017-03-29 MED ORDER — ALBUTEROL SULFATE (2.5 MG/3ML) 0.083% IN NEBU: 2.5000 mg | INHALATION_SOLUTION | Freq: Four times a day (QID) | RESPIRATORY_TRACT | Status: DC | PRN

## 2017-03-29 MED ORDER — HYDRALAZINE HCL 20 MG/ML IJ SOLN: 5.0000 mg | INTRAMUSCULAR | Status: DC | PRN

## 2017-03-29 MED ORDER — BIVALIRUDIN TRIFLUOROACETATE 250 MG IV SOLR
INTRAVENOUS | Status: AC
  Filled 2017-03-29: qty 250

## 2017-03-29 MED ORDER — NOREPINEPHRINE BITARTRATE 1 MG/ML IV SOLN
INTRAVENOUS | Status: AC
  Filled 2017-03-29: qty 4

## 2017-03-29 MED ORDER — ACETAMINOPHEN 325 MG PO TABS: 325.0000 mg | ORAL_TABLET | ORAL | Status: DC | PRN

## 2017-03-29 MED ORDER — GUAIFENESIN-DM 100-10 MG/5ML PO SYRP: 15.0000 mL | ORAL_SOLUTION | ORAL | Status: DC | PRN

## 2017-03-29 MED ORDER — PHENOL 1.4 % MT LIQD: 1.0000 | OROMUCOSAL | Status: DC | PRN

## 2017-03-29 MED ORDER — ATROPINE SULFATE 1 MG/10ML IJ SOSY
PREFILLED_SYRINGE | INTRAMUSCULAR | Status: AC
  Filled 2017-03-29: qty 10

## 2017-03-29 MED ORDER — VITAMIN D (ERGOCALCIFEROL) 1.25 MG (50000 UNIT) PO CAPS: 50000.0000 [IU] | ORAL_CAPSULE | ORAL | Status: DC

## 2017-03-29 MED ORDER — BIVALIRUDIN BOLUS VIA INFUSION - CUPID
INTRAVENOUS | Status: DC | PRN
  Administered 2017-03-29: 102.075 mg via INTRAVENOUS

## 2017-03-29 MED ORDER — LIRAGLUTIDE 18 MG/3ML ~~LOC~~ SOPN: 1.8000 mg | PEN_INJECTOR | Freq: Every day | SUBCUTANEOUS | Status: DC

## 2017-03-29 MED ORDER — LABETALOL HCL 5 MG/ML IV SOLN: 10.0000 mg | INTRAVENOUS | Status: DC | PRN

## 2017-03-29 MED ORDER — HEPARIN SODIUM (PORCINE) 5000 UNIT/ML IJ SOLN
5000.0000 [IU] | Freq: Three times a day (TID) | INTRAMUSCULAR | Status: DC
  Administered 2017-03-29 – 2017-03-30 (×2): 5000 [IU] via SUBCUTANEOUS
  Filled 2017-03-29 (×2): qty 1

## 2017-03-29 MED ORDER — ONDANSETRON HCL 4 MG/2ML IJ SOLN: 4.0000 mg | Freq: Four times a day (QID) | INTRAMUSCULAR | Status: DC | PRN

## 2017-03-29 MED ORDER — MORPHINE SULFATE (PF) 2 MG/ML IV SOLN: 2.0000 mg | INTRAVENOUS | Status: DC | PRN

## 2017-03-29 MED ORDER — IODIXANOL 320 MG/ML IV SOLN
INTRAVENOUS | Status: DC | PRN
  Administered 2017-03-29: 40 mL via INTRA_ARTERIAL

## 2017-03-29 MED ORDER — PANTOPRAZOLE SODIUM 40 MG PO TBEC
40.0000 mg | DELAYED_RELEASE_TABLET | Freq: Every day | ORAL | Status: DC
  Administered 2017-03-30: 40 mg via ORAL
  Filled 2017-03-29: qty 1

## 2017-03-29 MED ORDER — POTASSIUM CHLORIDE CRYS ER 20 MEQ PO TBCR: 20.0000 meq | EXTENDED_RELEASE_TABLET | Freq: Every day | ORAL | Status: DC | PRN

## 2017-03-29 MED ORDER — ACETAMINOPHEN 325 MG PO TABS: 650.0000 mg | ORAL_TABLET | ORAL | Status: DC | PRN

## 2017-03-29 MED ORDER — OXYCODONE-ACETAMINOPHEN 5-325 MG PO TABS: 1.0000 | ORAL_TABLET | ORAL | Status: DC | PRN

## 2017-03-29 MED ORDER — ALUM & MAG HYDROXIDE-SIMETH 200-200-20 MG/5ML PO SUSP: 15.0000 mL | ORAL | Status: DC | PRN

## 2017-03-29 MED ORDER — FUROSEMIDE 80 MG PO TABS
120.0000 mg | ORAL_TABLET | Freq: Two times a day (BID) | ORAL | Status: DC
  Administered 2017-03-30: 11:00:00 120 mg via ORAL
  Filled 2017-03-29: qty 1

## 2017-03-29 MED ORDER — ASPIRIN EC 325 MG PO TBEC
325.0000 mg | DELAYED_RELEASE_TABLET | Freq: Every day | ORAL | Status: DC
  Administered 2017-03-30: 325 mg via ORAL
  Filled 2017-03-29: qty 1

## 2017-03-29 MED ORDER — CLOPIDOGREL BISULFATE 75 MG PO TABS
75.0000 mg | ORAL_TABLET | Freq: Every day | ORAL | Status: DC
  Administered 2017-03-30: 75 mg via ORAL
  Filled 2017-03-29: qty 1

## 2017-03-29 MED ORDER — ATORVASTATIN CALCIUM 80 MG PO TABS
80.0000 mg | ORAL_TABLET | Freq: Every day | ORAL | Status: DC
  Administered 2017-03-30: 80 mg via ORAL
  Filled 2017-03-29: qty 1

## 2017-03-29 MED ORDER — LIDOCAINE HCL (PF) 1 % IJ SOLN
INTRAMUSCULAR | Status: AC
  Filled 2017-03-29: qty 30

## 2017-03-29 MED ORDER — ATROPINE SULFATE 1 MG/10ML IJ SOSY
PREFILLED_SYRINGE | INTRAMUSCULAR | Status: DC | PRN
  Administered 2017-03-29: 1 mg via INTRAVENOUS

## 2017-03-29 MED ORDER — SODIUM CHLORIDE 0.9 % IV SOLN
INTRAVENOUS | Status: DC
  Administered 2017-03-29: 07:00:00 via INTRAVENOUS

## 2017-03-29 MED ORDER — HEPARIN (PORCINE) IN NACL 2-0.9 UNIT/ML-% IJ SOLN
INTRAMUSCULAR | Status: AC | PRN
  Administered 2017-03-29: 1000 mL via INTRA_ARTERIAL

## 2017-03-29 MED ORDER — SODIUM CHLORIDE 0.9 % IV SOLN: 500.0000 mL | Freq: Once | INTRAVENOUS | Status: DC | PRN

## 2017-03-29 MED ORDER — SODIUM CHLORIDE 0.9 % IV SOLN
INTRAVENOUS | Status: DC | PRN
  Administered 2017-03-29: 1.75 mg/kg/h via INTRAVENOUS
  Administered 2017-03-29: 09:00:00

## 2017-03-29 MED ORDER — METOPROLOL SUCCINATE ER 25 MG PO TB24: 12.5000 mg | ORAL_TABLET | Freq: Every day | ORAL | Status: DC

## 2017-03-29 MED ORDER — ASPIRIN EC 325 MG PO TBEC: 325.0000 mg | DELAYED_RELEASE_TABLET | Freq: Every day | ORAL | Status: DC

## 2017-03-29 MED ORDER — DOCUSATE SODIUM 100 MG PO CAPS
100.0000 mg | ORAL_CAPSULE | Freq: Every day | ORAL | Status: DC
  Administered 2017-03-30: 100 mg via ORAL
  Filled 2017-03-29: qty 1

## 2017-03-29 MED ORDER — LIDOCAINE HCL (PF) 1 % IJ SOLN
INTRAMUSCULAR | Status: DC | PRN
  Administered 2017-03-29: 12 mL via SUBCUTANEOUS

## 2017-03-29 MED ORDER — INSULIN ASPART 100 UNIT/ML ~~LOC~~ SOLN
0.0000 [IU] | Freq: Three times a day (TID) | SUBCUTANEOUS | Status: DC
Start: 2017-03-29 — End: 2017-03-30
  Administered 2017-03-29: 8 [IU] via SUBCUTANEOUS
  Administered 2017-03-30: 5 [IU] via SUBCUTANEOUS

## 2017-03-29 MED ORDER — MAGNESIUM SULFATE 2 GM/50ML IV SOLN: 2.0000 g | Freq: Every day | INTRAVENOUS | Status: DC | PRN

## 2017-03-29 MED ORDER — CEFUROXIME SODIUM 1.5 G IV SOLR
1.5000 g | Freq: Two times a day (BID) | INTRAVENOUS | Status: AC
  Administered 2017-03-29 – 2017-03-30 (×2): 1.5 g via INTRAVENOUS
  Filled 2017-03-29 (×2): qty 1.5

## 2017-03-29 MED ORDER — METOPROLOL TARTRATE 5 MG/5ML IV SOLN: 2.0000 mg | INTRAVENOUS | Status: DC | PRN

## 2017-03-29 SURGICAL SUPPLY — 21 items
BALLN SAPPHIRE 3.0X20 (BALLOONS) ×2
BALLN VIATRAC 5X20X135 (BALLOONS) ×2
BALLOON SAPPHIRE 3.0X20 (BALLOONS) ×1 IMPLANT
BALLOON VIATRAC 5X20X135 (BALLOONS) ×1 IMPLANT
CATH ANGIO 5F BER2 100CM (CATHETERS) ×2 IMPLANT
COVER PRB 48X5XTLSCP FOLD TPE (BAG) ×1 IMPLANT
COVER PROBE 5X48 (BAG) ×1
DEVICE CONTINUOUS FLUSH (MISCELLANEOUS) ×2 IMPLANT
DEVICE EMBOSHIELD NAV6 4.0-7.0 (WIRE) ×2 IMPLANT
HOVERMATT SINGLE USE (MISCELLANEOUS) ×2 IMPLANT
KIT ENCORE 26 ADVANTAGE (KITS) ×2 IMPLANT
KIT PV (KITS) ×2 IMPLANT
SHEATH PINNACLE 6F 10CM (SHEATH) ×2 IMPLANT
SHEATH SHUTTLE SELECT 6F (SHEATH) ×2 IMPLANT
STENT XACT CAR 9-7X40X136 (Permanent Stent) ×2 IMPLANT
SYR MEDRAD MARK V 150ML (SYRINGE) ×2 IMPLANT
TRANSDUCER W/STOPCOCK (MISCELLANEOUS) ×2 IMPLANT
TRAY PV CATH (CUSTOM PROCEDURE TRAY) ×2 IMPLANT
WIRE AMPLATZ SSTIFF .035X260CM (WIRE) ×2 IMPLANT
WIRE HITORQ VERSACORE ST 145CM (WIRE) ×2 IMPLANT
WIRE ROSEN-J .035X260CM (WIRE) ×2 IMPLANT

## 2017-03-29 NOTE — Progress Notes (Signed)
5F sheath pulled from R femoral artery by Adalberto IllAaron Jailynn Lavalais. 15 minutes manual pressure applied. Site level 0 with no hematoma noted. Site dressed with tegaderm and 4x4. Instructions given to pt. Bedrest begins at 1215. PT +1.

## 2017-03-29 NOTE — Progress Notes (Signed)
Pt HRs in the 30-40s. Pt asymptomatic. BP 94/73. MD on call Fields made aware. Dr. Darrick PennaFields stated to monitor patient and Hold beta blockers in the morning.

## 2017-03-29 NOTE — Op Note (Addendum)
Procedure: Right carotid angiogram, right carotid angioplasty and stenting   Preoperative diagnosis: High-grade asymptomatic right internal carotid artery stenosis  Postoperative diagnosis: Same   Anesthesia: Local  Co-surgeon: Lemar Livings M.D.   Operative findings: Right femoral sheath.  Right ICA stenosis treated 80% to 0% residual Abbott xact stent 9 x 7 x 40  Operative details: After obtaining informed consent, the patient was taken to the PV lab. The patient was placed in supine position the Angio table. Both groins were prepped and draped in usual sterile fashion. Local anesthesia was infiltrated over the right common femoral artery. Ultrasound was used to locate the right common femoral artery.  An introducer needle was placed into the right common femoral artery and there was good backbleeding from this. A 0.035 Versacore wire was then threaded up the abdominal aorta under fluoroscopic guidance. A 5 Fr sheath was placed over the guidewire into the right common femoral artery.  This was thoroughly flushed with heparinized saline.  A 5 Fr Berenstein 2 catheter was placed over the wire and the wire advanced into the innominate followed by the right common carotid artery and this was confirmed by contrast angiogram. This was done in a caudal 22 degree RAO 29 degree position to lay out the innominate bifurcation. There is a 80% stenosis of the right internal carotid with moderate calcification.  The external carotid is patent but severely narrowed 99%.  Measurements were taken of the proximal distal and length of the lesion.  7 x 9 x 40 Abbott xact stent was selected.  An 035 Amplatz wire was then placed throught the Berenstein catheter and advanced to just below the carotid bifurcation.  A 6 French Cook shuttle sheath was then brought up on the operative field and advanced over the guidewire up into the right common carotid artery.  This was advanced over the dilator to the distal right common carotid  artery.  Angiomax bolus was given.  ACT was also checked and was confirmed > 200. At this point an Abbott 7 mm filter was brought on the operative field and this was advanced up to the level of the carotid stenosis.  With some manipulation the guidewire advanced through the stenosis with minimal difficulty. The filter was then advanced into the distal internal carotid artery at the level of the skull base. The filter was then deployed in this location. A 3 x 2 angioplasty balloon was brought in operative field and advanced to the level of stenosis and quickly inflated and deflated to 6 atmospheres. The predilatation balloon was removed. A 7 x 9 x 40 Abbott xact stent was then brought in the operative field and advanced to the level of stenosis.   Again using angiographic guidance, the stent was deployed so that the tip of the stent was past the level of the high-grade stenosis. The stent was then deployed . A postdilatation was then performed after the stent delivery device was removed. This was done with a 5 x 2 balloon inflated to 10 atmospheres up and down.  There was brief bradycardia which quickly resolved with 1 mg atropine.  The patient also experienced a few minutes of coughing which then resolved. A completion arteriogram was performed which showed that the residual stenosis was essentially 0 and the stent had good wall apposition with no dissection.  The filter was retrieved. Completion intracranial views were also performed in AP and lateral projection to be interpreted later by the neuroradiologist. The patient tolerated procedure well and there  were no complications. After the completion arteriogram had been performed, the sheath was pulled back over a guidewire and these were then pulled down to the guidewire and the sheath exchanged for a 6 French short sheath in the right femoral artery. This was thoroughly flushed with heparinized saline and was left in place to be pulled in the holding area after  the ACT is less than 175.The patient tolerated the procedure well and there were no neurologic complications. The patient was transported to the holding area in stable condition.   Fabienne Brunsharles Audrick Lamoureaux, MD  [facility] [place]  Office: [phone]  Pager: [phone]

## 2017-03-29 NOTE — H&P (Signed)
Requesting physician Dr. Jens Somrenshaw     History of Present Illness:  Patient is a 61 y.o. year old female who presents for evaluation of carotid stenosis.  The patient denies symptoms of TIA, amaurosis, or stroke.  The patient is currently on is antiplatelet therapy of aspirin 325mg .  The carotid stenosis was found on surveillance carotid duplex.  Other medical problems include DM insulin dependent, HTN beta blocker daily, CAD s/p CABG, left hemiparesis age 61 from stroke now completely recovered, and hypercholesterolemia managed with Lipitor daily.  These are currently stable and followed by Dr. Jens Somrenshaw.  The patient does smoke daily. She was counseled against this today.       Past Medical History:  Diagnosis Date  . Asthma    . Asthma    . CAD (coronary artery disease)      a. NSTEMI 12/12 -> s/p CABGx3.   . Carotid artery disease (HCC)      a. Carotid US (08/2013):  R 60-79%; L 40-59%; f/u 6 mos.  . Carotid stenosis      a. Carotid US (08/2013):  R 60-79%; L 40-59%; f/u 6 mos  . Chronic diastolic CHF (congestive heart failure) (HCC)      a. Echo 08/2011: mod LVH, nl EF. b. Echo 01/2013: EF 55-60%, mod-sev LVH, mod-sev LA dilitation  . Chronic diastolic heart failure (HCC)      a. Echo 08/2011: mod LVH, nl EF. b. Echo 01/2013: EF 55-60%, mod-sev LVH, mod-sev LA dilitation.  . Complication of anesthesia      '' Myself & my family wake up during surgery "  . Complication of anesthesia    . Coronary artery disease      a. NSTEMI 12/12 -> s/p CABGx3.  [name]. Cough    . Cough      a. 2014: Suspect a combination of CHF, GERD and ACE inhibitor-induced cough.  . Diabetes mellitus    . Diabetes mellitus (HCC)    . History of chicken pox    . HLD (hyperlipidemia)    . HTN (hypertension)    . Hyperlipidemia    . Hypertension    . Morbid obesity (HCC)    . Myocardial infarction (HCC)    . PONV (postoperative nausea and vomiting)    . S/P CABG (coronary artery bypass graft)      a. 2012 -  Dr. Laneta SimmersBartle;   LIMA-LAD, SVG-OM 2, SVG-PDA.  . 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 Laneta SimmersBartle;   LIMA-LAD, SVG-OM 2, SVG-PDA.  . Sleep disturbance      a. Had sleep study several years ago, wasn't told she has OSA but told she needs O2 at night.  . Sleep disturbance      Had sleep study several years ago, wasn't told she has OSA but told she needs O2 at night  . Stroke (HCC)    . Stroke [facility](HCC) 2006    denies residual  . TIA (transient ischemic attack)             Past Surgical History:  Procedure Laterality Date  . CHOLECYSTECTOMY      . CHOLECYSTECTOMY   1990's  . CORONARY ARTERY BYPASS GRAFT      . CORONARY ARTERY BYPASS GRAFT   09/07/2011    CABG X3; Procedure: CORONARY ARTERY BYPASS GRAFTING (CABG);  Surgeon: Alleen BorneBryan K Bartle, MD;  [facility];  Service: Open Heart Surgery;  Laterality: N/A;  . LEFT HEART CATHETERIZATION WITH CORONARY ANGIOGRAM  N/A 09/02/2011    Procedure: LEFT HEART CATHETERIZATION WITH CORONARY ANGIOGRAM;  Surgeon: Herby Abraham, MD;  Location: [facility] CATH LAB;  Service: Cardiovascular;  Laterality: N/A;  . TUBAL LIGATION   1981        Social History:  Patient currently smokes half pack cigarettes per day.   Family History      Family History  Problem Relation Age of Onset  . Cancer Mother    . Heart failure Mother          Also has hx MVP s/p MVR  . Diabetes Mother    . Coronary artery disease Father          Died of MI at age 64  . Heart disease Other          Parent      Allergies        Allergies  Allergen Reactions  . Lisinopril Cough and Nausea And Vomiting      ACE Inhibitor Cough ?Cough - changed to ARB 02/2013.  [name] Other Hives      Unknown IV antibiotic              Current Outpatient Prescriptions  Medication Sig Dispense Refill  . albuterol (PROVENTIL HFA;VENTOLIN HFA) 108 (90 BASE) MCG/ACT inhaler Inhale 2 puffs into the lungs every 6 (six) hours as needed for wheezing or shortness of breath. 1 Inhaler 1  . aspirin EC 325 MG EC tablet  Take 1 tablet (325 mg total) by mouth daily. 30 tablet    . atorvastatin (LIPITOR) 40 MG tablet Take 1 tablet (40 mg total) by mouth daily. 30 tablet 5  . furosemide (LASIX) 40 MG tablet Take 3 tablets (120 mg total) by mouth 2 (two) times daily. 540 tablet 3  . insulin regular human CONCENTRATED (HUMULIN R) 500 UNIT/ML injection INJECT 20 UNITS AT 8AM AND 12 UNITS AT 10PM WITH ADDITIONAL AS NEEDED.  MDD: 65 UNITS/DAY.      [name] insulin regular human CONCENTRATED (HUMULIN R) 500 UNIT/ML SOLN injection Inject 8-16 Units into the skin 3 (three) times daily with meals. 20 units in the morning Mid day dose is sliding scale ~12 units in the evening Max 65 units daily      . Liraglutide 18 MG/3ML SOPN Inject 1.8 mg into the skin daily.      . metoprolol succinate (TOPROL-XL) 25 MG 24 hr tablet Take 1 tablet (25 mg total) by mouth daily. 90 tablet 3  . potassium chloride SA (K-DUR,KLOR-CON) 20 MEQ tablet Take 2 tablets (40 mEq total) by mouth daily. NEEDS APPOINTMENT FOR FUTURE REFILLS 180 tablet 3  . VICTOZA 18 MG/3ML SOPN Inject 1.8 mg as directed daily.   4  . Vitamin D, Ergocalciferol, (DRISDOL) 50000 units CAPS capsule Take by mouth.        No current facility-administered medications for this visit.       ROS:    General:  No weight loss, Fever, chills   HEENT: No recent headaches, no nasal bleeding, no visual changes, no sore throat   Neurologic: No dizziness, blackouts, seizures. No recent symptoms of stroke or mini- stroke. No recent episodes of slurred speech, or temporary blindness.   Cardiac: No recent episodes of chest pain/pressure, no shortness of breath at rest.  + shortness of breath with exertion.  Denies history of atrial fibrillation or irregular heartbeat   Vascular: No history of rest pain in feet.  No history of claudication.  No history of  non-healing ulcer, No history of DVT    Pulmonary: No home oxygen, no productive cough, no hemoptysis,  No asthma or wheezing     Musculoskeletal:  [x ] Arthritis, [ ]  Low back pain,  [ ]  Joint pain   Hematologic:No history of hypercoagulable state.  No history of easy bleeding.  No history of anemia   Gastrointestinal: No hematochezia or melena,  No gastroesophageal reflux, no trouble swallowing   Urinary: [ ]  chronic Kidney disease, [ ]  on HD - [ ]  MWF or [ ]  TTHS, [ ]  Burning with urination, [ ]  Frequent urination, [ ]  Difficulty urinating;    Skin: No rashes   Psychological: No history of anxiety,  No history of depression     Physical Examination    Vitals:   03/29/17 0638 03/29/17 0642  BP:  (!) 148/61  Pulse: 92   Resp: 18   Temp: 98.2 F (36.8 C)   TempSrc: Oral   SpO2: 97%   Weight: 300 lb (136.1 kg)   Height: 5\' 6"  (1.676 m)    General:  Alert and oriented, no acute distress HEENT: Normal Neck: positive right bruit no JVD Pulmonary: Clear to auscultation bilaterally Cardiac: Regular Rate and Rhythm without murmur Gastrointestinal: Soft, non-tender, non-distended, no mass, obese Skin: No rash Extremity Pulses:  2+ radial, brachial, femoral, dorsalis pedis, pulses bilaterally Musculoskeletal: No deformity or edema      Neurologic: Upper and lower extremity motor 5/5 and symmetric   DATA:  Right carotid duplex with PSV of 363 PICA 80-99 % stenosis Left < 40%, high right carotid bifurcation difficult exam by duplex to determine distal extent of stenosis     ASSESSMENT:  Right carotid stenosis     PLAN: Right carotid stent due to high anatomical risk Stroke risk 3-5% discussed with pt who wishes to proceed   Fabienne Bruns, MD [facility] [place] Office: [phone] Pager: [phone]

## 2017-03-30 ENCOUNTER — Telehealth: Payer: Self-pay | Admitting: Vascular Surgery

## 2017-03-30 LAB — BASIC METABOLIC PANEL
ANION GAP: 8 (ref 5–15)
BUN: 30 mg/dL — ABNORMAL HIGH (ref 6–20)
CALCIUM: 9 mg/dL (ref 8.9–10.3)
CHLORIDE: 101 mmol/L (ref 101–111)
CO2: 25 mmol/L (ref 22–32)
Creatinine, Ser: 1.02 mg/dL — ABNORMAL HIGH (ref 0.44–1.00)
GFR calc non Af Amer: 58 mL/min — ABNORMAL LOW (ref >60)
Glucose, Bld: 244 mg/dL — ABNORMAL HIGH (ref 65–99)
Potassium: 4 mmol/L (ref 3.5–5.1)
SODIUM: 134 mmol/L — AB (ref 135–145)

## 2017-03-30 LAB — CBC
HEMATOCRIT: 35.5 % — AB (ref 36.0–46.0)
HEMOGLOBIN: 11.8 g/dL — AB (ref 12.0–15.0)
MCH: 30 pg (ref 26.0–34.0)
MCHC: 33.2 g/dL (ref 30.0–36.0)
MCV: 90.3 fL (ref 78.0–100.0)
Platelets: 218 10*3/uL (ref 150–400)
RBC: 3.93 MIL/uL (ref 3.87–5.11)
RDW: 13.9 % (ref 11.5–15.5)
WBC: 9.5 10*3/uL (ref 4.0–10.5)

## 2017-03-30 LAB — GLUCOSE, CAPILLARY: Glucose-Capillary: 249 mg/dL — ABNORMAL HIGH (ref 65–99)

## 2017-03-30 MED FILL — Norepinephrine Bitartrate IV Soln 1 MG/ML (Base Equivalent): INTRAVENOUS | Qty: 4 | Status: AC

## 2017-03-30 NOTE — Progress Notes (Signed)
[facility]   Subjective  - feels ok   Objective (!) 97/55 (!) 132 97.8 F (36.6 C) (Oral) 16 97%  Intake/Output Summary (Last 24 hours) at 03/30/17 0756 Last data filed at 03/30/17 0658  Gross per 24 hour  Intake              820 ml  Output                0 ml  Net              820 ml   Groin soft no hematoma Neuro: symmetric UE/LE facial muscles  Assessment/Planning: Bradycardia with BP low 100s.  Will hold beta blocker.  Pt will check BP at home and call Crenshaw if > 150SBP Carotid stent; plavix asa Urinary retention d/c foley D/c home today if BP reasonable 100-150 and able to pass urine  Fabienne BrunsFields, Rosaland Shiffman 03/30/2017 7:56 AM --  Laboratory Lab Results:  Recent Labs  03/29/17 1852 03/30/17 0316  WBC 9.7 9.5  HGB 12.0 11.8*  HCT 36.5 35.5*  PLT 223 218   BMET  Recent Labs  03/29/17 0706 03/29/17 1852 03/30/17 0316  NA 138  --  134*  K 4.5  --  4.0  CL 100*  --  101  CO2  --   --  25  GLUCOSE 183*  --  244*  BUN 37*  --  30*  CREATININE 1.00 1.10* 1.02*  CALCIUM  --   --  9.0    COAG Lab Results  Component Value Date   INR 1.17 09/07/2011   INR 1.06 09/01/2011   No results found for: PTT

## 2017-03-30 NOTE — Discharge Summary (Signed)
Discharge Summary     Felicia Rivas 1956/08/24 61 y.o. female  [id number]  Admission Date: 03/29/2017  Discharge Date: 03/30/17  Physician: Juventino Slovak*  Admission Diagnosis: Carotid stenosis [I65.29]   HPI:   This is a 61 y.o. female who presents for evaluation of carotid stenosis. The patient denies symptoms of TIA, amaurosis, or stroke. The patient is currently on isantiplatelet therapy of aspirin 325mg . The carotid stenosis was found on surveillance carotid duplex. Other medical problems include DM insulin dependent, HTN beta blocker daily, CAD s/p CABG, left hemiparesis age 78 from stroke now completely recovered, and hypercholesterolemia managed with Lipitor daily. These are currently stable and followed by Dr. Jens Som. The patient does smoke daily. She was counseled against this today.  Hospital Course:  The patient was admitted to the hospital and taken to the operating room on 03/29/2017 and underwent Right carotid angiogram, right carotid angioplasty and stenting .  The pt tolerated the procedure well and was transported to the PACU in good condition.   By POD 1, the pt neuro status is in tact.  She did have some bradycardia post operatively and her beta blocker is discontinued.  Her foley is removed and she was able to void.  She will be discharged home with instructions on recording her BP and calling Dr. Jens Som at certain parameters.  The remainder of the hospital course consisted of increasing mobilization and increasing intake of solids without difficulty.    Recent Labs  03/29/17 0706 03/30/17 0316  NA 138 134*  K 4.5 4.0  CL 100* 101  CO2  --  25  GLUCOSE 183* 244*  BUN 37* 30*  CALCIUM  --  9.0    Recent Labs  03/29/17 1852 03/30/17 0316  WBC 9.7 9.5  HGB 12.0 11.8*  HCT 36.5 35.5*  PLT 223 218   No results for input(s): INR in the last 72 hours.   Discharge Instructions    CAROTID Sugery: Call MD for difficulty  swallowing or speaking; weakness in arms or legs that is a new symtom; severe headache.  If you have increased swelling in the neck and/or  are having difficulty breathing, CALL 911    Complete by:  As directed    Call MD for:  redness, tenderness, or signs of infection (pain, swelling, bleeding, redness, odor or green/yellow discharge around incision site)    Complete by:  As directed    Call MD for:  severe or increased pain, loss or decreased feeling  in affected limb(s)    Complete by:  As directed    Call MD for:  temperature >100.5    Complete by:  As directed    Discharge instructions    Complete by:  As directed    Stop taking your blood pressure medications and record your blood pressure twice a day and keep record of it & your heart rate.  If the systolic (top number) gets above 150, call Dr. Ludwig Clarks office.   Discharge wound care:    Complete by:  As directed    Shower daily with soap and water starting 03/30/17   Driving Restrictions    Complete by:  As directed    No driving for 2 weeks & while taking pain medication.   Lifting restrictions    Complete by:  As directed    No heavy lifting for 2 weeks   Resume previous diet    Complete by:  As directed       Discharge  Diagnosis:  Carotid stenosis [I65.29]  Secondary Diagnosis: Patient Active Problem List   Diagnosis Date Noted  . Acute on chronic diastolic congestive heart failure (HCC) 11/14/2014  . ACE-inhibitor cough 11/10/2014  . CAD (coronary artery disease) 11/10/2014  . Hx of CABG 11/10/2014  . History of asthma 11/10/2014  . Obesity 11/10/2014  . Carotid artery disease (HCC) 11/10/2014  . ACE-inhibitor cough 11/10/2014  . Cough 11/10/2014  . Chronic diastolic CHF (congestive heart failure) (HCC) 11/10/2014  . LVH (left ventricular hypertrophy) 11/10/2014  . Shortness of breath 11/10/2014  . Hypotension 11/10/2014  . Renal insufficiency 11/10/2014  . Lightheadedness 11/10/2014  . Bronchitis, acute  11/10/2014  . Morbid obesity (HCC)   . Carotid stenosis   . Pain in joint, ankle and foot 04/05/2013  . Preventative health care 11/26/2011  . Hypertension 11/25/2011  . Incisional infection 11/03/2011  . Retinopathy 10/18/2011  . Hx of CABG 2012   . Chronic diastolic heart failure (HCC)   . HLD (hyperlipidemia)   . 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 Obesity 09/01/2011  . Asthma 08/31/2011  . DM2 (diabetes mellitus, type 2) (HCC) 08/31/2011   Past Medical History:  Diagnosis Date  . Asthma   . Asthma   . CAD (coronary artery disease)    a. NSTEMI 12/12 -> s/p CABGx3.   . Carotid artery disease (HCC)    a. Carotid US (08/2013): R 60-79%; L 40-59%; f/u 6 mos.  . Carotid stenosis    a. Carotid US (08/2013):  R 60-79%; L 40-59%; f/u 6 mos  . Chronic diastolic CHF (congestive heart failure) (HCC)    a. Echo 08/2011: mod LVH, nl EF. b. Echo 01/2013: EF 55-60%, mod-sev LVH, mod-sev LA dilitation  . Chronic diastolic heart failure (HCC)    a. Echo 08/2011: mod LVH, nl EF. b. Echo 01/2013: EF 55-60%, mod-sev LVH, mod-sev LA dilitation.  . Complication of anesthesia    '' Myself & my family wake up during surgery "  . Complication of anesthesia   . Coronary artery disease    a. NSTEMI 12/12 -> s/p CABGx3.  [name]. Cough   . Cough    a. 2014: Suspect a combination of CHF, GERD and ACE inhibitor-induced cough.  . Diabetes mellitus   . Diabetes mellitus (HCC)   . History of chicken pox   . HLD (hyperlipidemia)   . HTN (hypertension)   . Hyperlipidemia   . Hypertension   . Morbid obesity (HCC)   . Myocardial infarction (HCC)   . PONV (postoperative nausea and vomiting)   . S/P CABG (coronary artery bypass graft)    a. 2012 - Dr. Laneta SimmersBartle;  LIMA-LAD, SVG-OM 2, SVG-PDA.  . S/P CABG x 3 09/07/11   Dr. Laneta SimmersBartle;   LIMA-LAD, SVG-OM 2, SVG-PDA.  . Sleep disturbance    a. Had sleep study several years ago, wasn't told she has OSA but told she needs O2 at night.  . Sleep disturbance    Had sleep study  several years ago, wasn't told she has OSA but told she needs O2 at night  . Stroke (HCC)   . Stroke [facility](HCC) 2006   denies residual  . TIA (transient ischemic attack)     Allergies as of 03/30/2017      Reactions   Lisinopril Cough, Nausea And Vomiting   ACE Inhibitor Cough ?Cough - changed to ARB 02/2013.   Other Hives   Unknown IV antibiotic      Medication List  STOP taking these medications   metoprolol succinate 25 MG 24 hr tablet Commonly known as:  TOPROL-XL     TAKE these medications   albuterol 108 (90 Base) MCG/ACT inhaler Commonly known as:  PROVENTIL HFA;VENTOLIN HFA Inhale 2 puffs into the lungs every 6 (six) hours as needed for wheezing or shortness of breath.   aspirin 325 MG EC tablet Take 1 tablet (325 mg total) by mouth daily.   atorvastatin 80 MG tablet Commonly known as:  LIPITOR Take 80 mg by mouth daily.   clopidogrel 75 MG tablet Commonly known as:  PLAVIX Take 1 tablet (75 mg total) by mouth daily.   furosemide 40 MG tablet Commonly known as:  LASIX Take 3 tablets (120 mg total) by mouth 2 (two) times daily.   insulin regular human CONCENTRATED 500 UNIT/ML injection Commonly known as:  HUMULIN R INJECT 20 UNITS AT 8AM AND 12 UNITS AT 10PM WITH ADDITIONAL AS NEEDED.  MDD: 65 UNITS/DAY.   liraglutide 18 MG/3ML Sopn Inject 1.8 mg into the skin daily.   potassium chloride SA 20 MEQ tablet Commonly known as:  K-DUR,KLOR-CON Take 2 tablets (40 mEq total) by mouth daily.   Vitamin D (Ergocalciferol) 50000 units Caps capsule Commonly known as:  DRISDOL Take 50,000 Units by mouth every Monday.       Prescriptions given: none  Instructions: 1.  No driving x 2 days & while taking pain medication 2.  No heavy lifting x 2 weeks 3.  Shower daily with soap and water starting 03/30/17 4.  Stop taking your blood pressure medications and record your blood pressure twice a day and keep record of it & your heart rate.  If the systolic (top number)  gets above 150, call Dr. Ludwig Clarks office.  Disposition: home  Patient's condition: is Good  Follow up: 1. Dr. Darrick Penna in 4 weeks with carotid duplex   Doreatha Massed, PA-C [facility] [phone]   --- For [facility] Registry use ---   Modified Rankin score at D/C (0-6): 0  IV medication needed for:  1. Hypertension: No 2. Hypotension: No  Post-op Complications: No  1. Post-op CVA or TIA: No  If yes: Event classification (right eye, left eye, right cortical, left cortical, verterobasilar, other): n/a  If yes: Timing of event (intra-op, <6 hrs post-op, >=6 hrs post-op, unknown): n/a  2. CN injury: No  If yes: CN n/a injuried   3. Myocardial infarction: No  If yes: Dx by (EKG or clinical, Troponin): n/a  4.  CHF: No  5.  Dysrhythmia (new): No  6. Wound infection: No  7. Reperfusion symptoms: No  8. Return to OR: No  If yes: return to OR for (bleeding, neurologic, other CEA incision, other): n/a  Discharge medications: Statin use:  Yes   If No:   ASA use:  Yes  If No:   Beta blocker use:  No ACE-Inhibitor use:  No  ARB use:  No CCB use: No P2Y12 Antagonist use: Yes, [x ] Plavix, [ ]  Plasugrel, [ ]  Ticlopinine, [ ]  Ticagrelor, [ ]  Other, [ ]  No for medical reason, [ ]  Non-compliant, [ ]  Not-indicated Anti-coagulant use:  No, [ ]  Warfarin, [ ]  Rivaroxaban, [ ]  Dabigatran,

## 2017-03-30 NOTE — Telephone Encounter (Signed)
-----   Message from Sharee PimpleMarilyn K McChesney, RN sent at 03/30/2017  8:48 AM EDT ----- Regarding: 4 weeks w/ carotid duplex   ----- Message ----- From: Dara Lordshyne, Samantha J, PA-C Sent: 03/30/2017   7:57 AM To: Vvs Charge Pool  S/p carotid stenting.  F/u with Dr. Darrick PennaFields in 4 weeks with carotid duplex.  Thanks

## 2017-03-30 NOTE — Progress Notes (Signed)
Order received to discharge patient.  Telemetry monitor removed and CCMD notified.  PIV access removed x2.  Discharge instructions, follow up, medications and instructions for their use were discussed with patient.

## 2017-03-30 NOTE — Telephone Encounter (Signed)
Sched lab 05/04/17 at 4:00 and MD 05/11/17 at 10:15. Lm on hm#.

## 2017-03-30 NOTE — Care Management Note (Signed)
Case Management Note Donn PieriniKristi Moorea Boissonneault RN, BSN Unit 4E-Case Manager [phone]  Patient Details  Name: Felicia BeckmannSharon Rivas MRN: [id number] Date of Birth: 03/24/1956  Subjective/Objective:  Pt admitted s/p Right carotid angiogram, right carotid angioplasty and stenting                   Action/Plan: PTA pt lived at home- plan to d/c home- no CM need noted for discharge  Expected Discharge Date:  03/30/17               Expected Discharge Plan:  Home/Self Care  In-House Referral:  NA  Discharge planning Services  CM Consult  Post Acute Care Choice:  NA Choice offered to:  NA  DME Arranged:  N/A DME Agency:  NA  HH Arranged:  NA HH Agency:  NA  Status of Service:  Completed, signed off  If discussed at Long Length of Stay Meetings, dates discussed:    Discharge Disposition: home/self care   Additional Comments:  Darrold SpanWebster, Dhruv Christina Hall, RN 03/30/2017, 10:40 AM

## 2017-03-30 NOTE — Progress Notes (Signed)
Patient's Foley catheter was removed per MD order.  Patient was able to void on her own shortly thereafter.

## 2017-04-03 ENCOUNTER — Telehealth: Payer: Self-pay | Admitting: *Deleted

## 2017-04-03 NOTE — Consult Note (Signed)
NAME:  Felicia Rivas, Felicia Rivas                    ACCOUNT NO.:  MEDICAL RECORD NO.:  [id number]  LOCATION:                                 FACILITY:  PHYSICIAN:  Beaulah Romanek K. Nashaun Hillmer, M.D.  DATE OF BIRTH:  DATE OF CONSULTATION: DATE OF DISCHARGE:                                CONSULTATION   CLINICAL HISTORY:  Patient with right carotid artery stenosis.  EXAMINATION:  Intracranial interpretation of right common carotid arteriogram postangioplasty/stenting of the right common carotid artery/right internal carotid artery proximally.  No angiographic study of the right common carotid artery injection pre- stenting has been presented.  The post-stenting right common carotid arteriogram demonstrates wide patency of the right internal carotid artery into its   distal cervical segment, and also petrous segment.  There is mild-to-moderate caliber irregularity of the cavernous segment in its proximal aspect without significant stenosis on the lateral projection.  Mild atherosclerotic irregularity is noted of the proximal supraclinoid right ICA.  A dominant right posterior communicating artery seen opacifying the right posterior cerebral artery distribution.  Brisk opacification of right middle cerebral artery distribution is noted without gross evidence of filling defects  Or occlusions.  The right anterior cerebral artery is hypoplastic with opacification of the right anterior cerebral artery A1 segment.  Flow is noted into the distal A2 and the A3 distributions of the right anterior cerebral artery.  The delayed capillary phase demonstrates hypoperfusion in the anterior- frontal and subcortical area.  IMPRESSION:  On the  post-stenting right common carotid arteriogram, no gross filling defects or occlusions are seen.   Mild-to-moderateatherosclerotic changes noted in the cavernous segment of the rightinternal carotid artery proximally.  Poor filling of the right anterior cerebral  artery distally, with hypoperfusion of the cortical and subcortical area of the right anterior- frontal cortical and subcortical region noted.          ______________________________ Grandville SilosSanjeev K. Corliss Skainseveshwar, M.D.     SKD/MEDQ  D:  04/01/2017  T:  04/01/2017  Job:  [id number]

## 2017-04-03 NOTE — Telephone Encounter (Signed)
Returned call to patient.  She is stating that since her procedure she is experiencing severe weakness and leg pain, especially first thing of the morning.  She is having to hold on to the wall when walking. This is not improving much throughout the day.  An appointment was scheduled with the NP 04/07/2017 and the patient was given instructions to report to the ED if these symptoms should worsen.  Felicia Rivas voiced understanding of these instructions.

## 2017-04-07 ENCOUNTER — Encounter: Payer: Self-pay | Admitting: Family

## 2017-04-07 ENCOUNTER — Ambulatory Visit (INDEPENDENT_AMBULATORY_CARE_PROVIDER_SITE_OTHER): Payer: BC Managed Care – PPO | Admitting: Family

## 2017-04-07 VITALS — BP 137/81 | HR 73 | Temp 97.2°F | Resp 20 | Ht 66.0 in | Wt 297.0 lb

## 2017-04-07 DIAGNOSIS — M79661 Pain in right lower leg: Secondary | ICD-10-CM | POA: Diagnosis not present

## 2017-04-07 DIAGNOSIS — R0989 Other specified symptoms and signs involving the circulatory and respiratory systems: Secondary | ICD-10-CM

## 2017-04-07 DIAGNOSIS — Z9889 Other specified postprocedural states: Secondary | ICD-10-CM

## 2017-04-07 DIAGNOSIS — R29898 Other symptoms and signs involving the musculoskeletal system: Secondary | ICD-10-CM | POA: Diagnosis not present

## 2017-04-07 DIAGNOSIS — M79662 Pain in left lower leg: Secondary | ICD-10-CM

## 2017-04-07 DIAGNOSIS — R2681 Unsteadiness on feet: Secondary | ICD-10-CM | POA: Diagnosis not present

## 2017-04-07 DIAGNOSIS — Z95828 Presence of other vascular implants and grafts: Secondary | ICD-10-CM

## 2017-04-07 DIAGNOSIS — I6521 Occlusion and stenosis of right carotid artery: Secondary | ICD-10-CM

## 2017-04-07 NOTE — Progress Notes (Signed)
Postoperative Visit   History of Present Illness  Felicia BeckmannSharon Rivas is a 61 y.o. female who is s/p right carotid angioplasty and stenting on 03-29-17 by Dr. Darrick Rivas for high-grade asymptomatic right internal carotid artery stenosis.  Dr. Darrick Rivas initially evaluated pt on 02-23-17.  The patient denied recent symptoms of TIA, amaurosis, or stroke.  The patient is currently on is antiplatelet therapy of aspirin 325mg .  The carotid stenosis was found on surveillance carotid duplex.  Other medical problems include DM insulin dependent, HTN beta blocker daily, CAD s/p CABG, left hemiparesis age 61 from stroke now completely recovered, and hypercholesterolemia managed with Lipitor daily.  These are currently stable and followed by Dr. Jens Rivas.     She returns today with c/o that since the carotid artery stenting she is experiencing severe weakness and pain in both legs, especially first thing of the morning, this improves throughout the day as she walks.  She is having to hold on to the wall when walking.  She denies any pain or weakness in her legs prior to carotid stenting. She denies any known back problems. Her legs hurt worse when supine and she tries to move her legs.  She denies any pain, weakness, or numbness in her arms, denies any sudden vision change or speech difficulties.    She has a carotid duplex scheduled for 05-04-17, and see Dr. Darrick Rivas on 05-11-17.   She states her last A1C was 7.5 in April 2018.  She stopped smoking in 2013, does not live with any smokers.    For VQI Use Only  PRE-ADM LIVING: Home  AMB STATUS: Ambulatory  Past Medical History:  Diagnosis Date  . Asthma   . Asthma   . CAD (coronary artery disease)    a. NSTEMI 12/12 -> s/p CABGx3.   . Carotid artery disease (HCC)    a. Carotid US (08/2013): R 60-79%; L 40-59%; f/u 6 mos.  . Carotid stenosis    a. Carotid US (08/2013):  R 60-79%; L 40-59%; f/u 6 mos  . Chronic diastolic CHF (congestive heart failure) (HCC)     a. Echo 08/2011: mod LVH, nl EF. b. Echo 01/2013: EF 55-60%, mod-sev LVH, mod-sev LA dilitation  . Chronic diastolic heart failure (HCC)    a. Echo 08/2011: mod LVH, nl EF. b. Echo 01/2013: EF 55-60%, mod-sev LVH, mod-sev LA dilitation.  . Complication of anesthesia    '' Myself & my family wake up during surgery "  . Complication of anesthesia   . Coronary artery disease    a. NSTEMI 12/12 -> s/p CABGx3.  [name]. Cough   . Cough    a. 2014: Suspect a combination of CHF, GERD and ACE inhibitor-induced cough.  . Diabetes mellitus   . Diabetes mellitus (HCC)   . History of chicken pox   . HLD (hyperlipidemia)   . HTN (hypertension)   . Hyperlipidemia   . Hypertension   . Morbid obesity (HCC)   . Myocardial infarction (HCC)   . PONV (postoperative nausea and vomiting)   . S/P CABG (coronary artery bypass graft)    a. 2012 - Dr. Laneta Rivas;  LIMA-LAD, SVG-OM 2, SVG-PDA.  . S/P CABG x 3 09/07/11   Dr. Laneta Rivas;   LIMA-LAD, SVG-OM 2, SVG-PDA.  . Sleep disturbance    a. Had sleep study several years ago, wasn't told she has OSA but told she needs O2 at night.  . Sleep disturbance    Had sleep study several years ago, wasn't  told she has OSA but told she needs O2 at night  . Stroke (HCC)   . Stroke [facility]) 2006   denies residual  . TIA (transient ischemic attack)     Past Surgical History:  Procedure Laterality Date  . CAROTID ANGIOGRAPHY N/A 03/17/2017   Procedure: Carotid Angiography;  Surgeon: Felicia Kerns, MD;  Location: [facility] INVASIVE CV LAB;  Service: Cardiovascular;  Laterality: N/A;  . CAROTID PTA/STENT INTERVENTION Right 03/29/2017   Procedure: Carotid PTA/Stent Intervention;  Surgeon: Felicia Kerns, MD;  Location: [facility] INVASIVE CV LAB;  Service: Cardiovascular;  Laterality: Right;  . CHOLECYSTECTOMY    . CHOLECYSTECTOMY  1990's  . CORONARY ARTERY BYPASS GRAFT    . CORONARY ARTERY BYPASS GRAFT  09/07/2011   CABG X3; Procedure: CORONARY ARTERY BYPASS GRAFTING (CABG);  Surgeon:  Felicia Borne, MD;  Location: [facility] OR;  Service: Open Heart Surgery;  Laterality: N/A;  . LEFT HEART CATHETERIZATION WITH CORONARY ANGIOGRAM N/A 09/02/2011   Procedure: LEFT HEART CATHETERIZATION WITH CORONARY ANGIOGRAM;  Surgeon: Felicia Abraham, MD;  Location: [facility] CATH LAB;  Service: Cardiovascular;  Laterality: N/A;  . TUBAL LIGATION  1981    Allergies  Allergen Reactions  . Lisinopril Cough and Nausea And Vomiting    ACE Inhibitor Cough ?Cough - changed to ARB 02/2013.  [name] Other Hives    Unknown IV antibiotic    Social History   Social History  . Marital status: Married    Spouse name: N/A  . Number of children: 3  . Years of education: 74   Occupational History  . Teacher [employer]   Social History Main Topics  . Smoking status: Former Smoker    Packs/day: 0.50    Years: 25.00    Types: Cigarettes    Quit date: 11/11/2011  . Smokeless tobacco: Former Neurosurgeon  . Alcohol use No  . Drug use: No  . Sexual activity: No   Other Topics Concern  . Not on file   Social History Narrative   ** Merged History Encounter **       Regular exercise-yes    Current Outpatient Prescriptions on File Prior to Visit  Medication Sig Dispense Refill  . albuterol (PROVENTIL HFA;VENTOLIN HFA) 108 (90 BASE) MCG/ACT inhaler Inhale 2 puffs into the lungs every 6 (six) hours as needed for wheezing or shortness of breath. 1 Inhaler 1  . aspirin EC 325 MG EC tablet Take 1 tablet (325 mg total) by mouth daily. 30 tablet   . atorvastatin (LIPITOR) 80 MG tablet Take 80 mg by mouth daily.    . clopidogrel (PLAVIX) 75 MG tablet Take 1 tablet (75 mg total) by mouth daily. 30 tablet 11  . furosemide (LASIX) 40 MG tablet Take 3 tablets (120 mg total) by mouth 2 (two) times daily. 540 tablet 3  . insulin regular human CONCENTRATED (HUMULIN R) 500 UNIT/ML injection INJECT 20 UNITS AT 8AM AND 12 UNITS AT 10PM WITH ADDITIONAL AS NEEDED.  MDD: 65 UNITS/DAY.    [name] Liraglutide 18 MG/3ML SOPN Inject  1.8 mg into the skin daily.    . potassium chloride SA (K-DUR,KLOR-CON) 20 MEQ tablet Take 2 tablets (40 mEq total) by mouth daily. 180 tablet 3  . Vitamin D, Ergocalciferol, (DRISDOL) 50000 units CAPS capsule Take 50,000 Units by mouth every Monday.      No current facility-administered medications on file prior to visit.     Physical Examination  Vitals:   04/07/17 1129  BP: 137/81  Pulse: 73  Resp: 20  Temp: (!) 97.2 F (36.2 C)  TempSrc: Oral  SpO2: 95%  Weight: 297 lb (134.7 kg)  Height: 5\' 6"  (1.676 m)   Body mass index is 47.94 kg/m.   PHYSICAL EXAMINATION: General: The patient appears their stated age, morbidly obese female.   HEENT:  No gross abnormalities Pulmonary: Respirations are non-labored sitting up, slightly labored supine Abdomen: Soft and non-tender, large panus. Musculoskeletal: There are no major deformities.   Neurologic: No focal weakness or paresthesias are detected, negative straight leg raise test. CN 2-12 are intact,  Motor strength: is 5/5 in upper extremities, 3/5 in lower extremities, sensation is grossly intact. Skin: There are no ulcer or rashes noted. The plantar surfaces of both feet are irritated in appearance, dry, no pruritus. Psychiatric: The patient has normal affect, slightly anxious. Cardiovascular: There is a regular rate and rhythm without significant murmur appreciated.   Vascular: Vessel Right Left  Radial 2+Palpable 2+Palpable  Carotid Palpable, without bruit Palpable, without bruit  Aorta Not palpable N/A  Femoral 2+Palpable 2+Palpable  Popliteal Not palpable Not palpable  PT Not Palpable, + Doppler signal Not Palpable, + brisk Doppler signal  DP Not Palpable, + Doppler signal Not Palpable, + brisk Doppler signal     Medical Decision Making  Felicia Rivas is a 61 y.o. female who presents s/p right carotid angioplasty and stenting on 03-29-17. She has no history of stroke or TIA. Since the carotid artery stenting she is  experiencing severe weakness and pain in both legs, especially first thing of the morning, this improves throughout the day as she walks.  She is having to hold on to the wall when walking.  She denies any pain or weakness in her legs prior to carotid stenting. She denies any known back problems. Her legs hurt worse when supine and she tries to move her legs.  She denies any pain, weakness, or numbness in her arms, denies any sudden vision change or speech difficulties. Straight leg raise against pressure does not elicit pain.  Her pedal pulses are not palpable but there are brisk Doppler signals in the left PT and DP, present but less brisk in the right PT and DP.  Dr. Randie Heinz spoke with and examined pt. Refer to neurologist to evaluate weakness and pain in bilateral legs, prescription for walker given. Will add ABI's on her return in 05-04-17 with carotid duplex.  Follow up with Dr. Darrick Penna as scheduled on 05-11-17.    Thank you for allowing [place] to participate in this patient's care.  NICKEL, Carma Lair, RN, MSN, FNP-C [facility] [place] Office: [phone]  04/07/2017, 11:38 AM  Clinic MD: Chen/Cain

## 2017-04-07 NOTE — Patient Instructions (Signed)
Stroke Prevention Some medical conditions and behaviors are associated with an increased chance of having a stroke. You may prevent a stroke by making healthy choices and managing medical conditions. How can I reduce my risk of having a stroke?  Stay physically active. Get at least 30 minutes of activity on most or all days.  Do not smoke. It may also be helpful to avoid exposure to secondhand smoke.  Limit alcohol use. Moderate alcohol use is considered to be:  No more than 2 drinks per day for men.  No more than 1 drink per day for nonpregnant women.  Eat healthy foods. This involves:  Eating 5 or more servings of fruits and vegetables a day.  Making dietary changes that address high blood pressure (hypertension), high cholesterol, diabetes, or obesity.  Manage your cholesterol levels.  Making food choices that are high in fiber and low in saturated fat, trans fat, and cholesterol may control cholesterol levels.  Take any prescribed medicines to control cholesterol as directed by your health care provider.  Manage your diabetes.  Controlling your carbohydrate and sugar intake is recommended to manage diabetes.  Take any prescribed medicines to control diabetes as directed by your health care provider.  Control your hypertension.  Making food choices that are low in salt (sodium), saturated fat, trans fat, and cholesterol is recommended to manage hypertension.  Ask your health care provider if you need treatment to lower your blood pressure. Take any prescribed medicines to control hypertension as directed by your health care provider.  If you are 18-39 years of age, have your blood pressure checked every 3-5 years. If you are 40 years of age or older, have your blood pressure checked every year.  Maintain a healthy weight.  Reducing calorie intake and making food choices that are low in sodium, saturated fat, trans fat, and cholesterol are recommended to manage  weight.  Stop drug abuse.  Avoid taking birth control pills.  Talk to your health care provider about the risks of taking birth control pills if you are over 35 years old, smoke, get migraines, or have ever had a blood clot.  Get evaluated for sleep disorders (sleep apnea).  Talk to your health care provider about getting a sleep evaluation if you snore a lot or have excessive sleepiness.  Take medicines only as directed by your health care provider.  For some people, aspirin or blood thinners (anticoagulants) are helpful in reducing the risk of forming abnormal blood clots that can lead to stroke. If you have the irregular heart rhythm of atrial fibrillation, you should be on a blood thinner unless there is a good reason you cannot take them.  Understand all your medicine instructions.  Make sure that other conditions (such as anemia or atherosclerosis) are addressed. Get help right away if:  You have sudden weakness or numbness of the face, arm, or leg, especially on one side of the body.  Your face or eyelid droops to one side.  You have sudden confusion.  You have trouble speaking (aphasia) or understanding.  You have sudden trouble seeing in one or both eyes.  You have sudden trouble walking.  You have dizziness.  You have a loss of balance or coordination.  You have a sudden, severe headache with no known cause.  You have new chest pain or an irregular heartbeat. Any of these symptoms may represent a serious problem that is an emergency. Do not wait to see if the symptoms will go away.   Get medical help at once. Call your local emergency services (911 in [place] Do not drive yourself to the hospital. This information is not intended to replace advice given to you by your health care provider. Make sure you discuss any questions you have with your health care provider. Document Released: 10/13/2004 Document Revised: 02/11/2016 Document Reviewed: 03/08/2013 Elsevier  Interactive Patient Education  2017 Elsevier Inc.  

## 2017-04-10 NOTE — Addendum Note (Signed)
Addended by: Burton ApleyPETTY, Wash Nienhaus A on: 04/10/2017 02:37 PM   Modules accepted: Orders

## 2017-05-02 ENCOUNTER — Encounter: Payer: Self-pay | Admitting: Vascular Surgery

## 2017-05-04 ENCOUNTER — Encounter (HOSPITAL_COMMUNITY): Payer: Self-pay

## 2017-05-04 ENCOUNTER — Ambulatory Visit (HOSPITAL_COMMUNITY)
Admission: RE | Admit: 2017-05-04 | Discharge: 2017-05-04 | Disposition: A | Discharge status: Home / Self Care | Payer: BC Managed Care – PPO | Source: Ambulatory Visit | Attending: Vascular Surgery | Admitting: Vascular Surgery

## 2017-05-04 ENCOUNTER — Ambulatory Visit (INDEPENDENT_AMBULATORY_CARE_PROVIDER_SITE_OTHER)
Admission: RE | Admit: 2017-05-04 | Discharge: 2017-05-04 | Disposition: A | Discharge status: Home / Self Care | Payer: BC Managed Care – PPO | Source: Ambulatory Visit | Attending: Vascular Surgery | Admitting: Vascular Surgery

## 2017-05-04 DIAGNOSIS — Z9889 Other specified postprocedural states: Secondary | ICD-10-CM | POA: Diagnosis not present

## 2017-05-04 DIAGNOSIS — Z95828 Presence of other vascular implants and grafts: Secondary | ICD-10-CM

## 2017-05-04 DIAGNOSIS — R9439 Abnormal result of other cardiovascular function study: Secondary | ICD-10-CM | POA: Diagnosis not present

## 2017-05-04 DIAGNOSIS — I739 Peripheral vascular disease, unspecified: Secondary | ICD-10-CM | POA: Diagnosis not present

## 2017-05-04 DIAGNOSIS — R29898 Other symptoms and signs involving the musculoskeletal system: Secondary | ICD-10-CM | POA: Diagnosis not present

## 2017-05-04 DIAGNOSIS — R2681 Unsteadiness on feet: Secondary | ICD-10-CM

## 2017-05-04 DIAGNOSIS — M79661 Pain in right lower leg: Secondary | ICD-10-CM

## 2017-05-04 DIAGNOSIS — I6523 Occlusion and stenosis of bilateral carotid arteries: Secondary | ICD-10-CM | POA: Insufficient documentation

## 2017-05-04 DIAGNOSIS — M79662 Pain in left lower leg: Secondary | ICD-10-CM | POA: Diagnosis not present

## 2017-05-04 DIAGNOSIS — I6521 Occlusion and stenosis of right carotid artery: Secondary | ICD-10-CM

## 2017-05-04 LAB — VAS US CAROTID
LCCADDIAS: 30 cm/s
LCCADSYS: 90 cm/s
LCCAPSYS: 89 cm/s
LEFT ECA DIAS: -24 cm/s
LEFT VERTEBRAL DIAS: 16 cm/s
LICADDIAS: -31 cm/s
LICADSYS: -101 cm/s
Left CCA prox dias: 20 cm/s
Left ICA prox dias: -38 cm/s
Left ICA prox sys: -131 cm/s
RCCADSYS: -104 cm/s
RCCAPDIAS: 17 cm/s
RCCAPSYS: 58 cm/s
RIGHT CCA MID DIAS: -11 cm/s
RIGHT ECA DIAS: 110 cm/s
RIGHT VERTEBRAL DIAS: 25 cm/s

## 2017-05-11 ENCOUNTER — Ambulatory Visit (INDEPENDENT_AMBULATORY_CARE_PROVIDER_SITE_OTHER): Payer: Self-pay | Admitting: Vascular Surgery

## 2017-05-11 ENCOUNTER — Encounter: Payer: Self-pay | Admitting: Vascular Surgery

## 2017-05-11 VITALS — BP 120/71 | HR 78 | Temp 97.8°F | Resp 18 | Ht 66.0 in | Wt 298.0 lb

## 2017-05-11 DIAGNOSIS — I739 Peripheral vascular disease, unspecified: Secondary | ICD-10-CM

## 2017-05-11 DIAGNOSIS — I6523 Occlusion and stenosis of bilateral carotid arteries: Secondary | ICD-10-CM

## 2017-05-11 NOTE — Progress Notes (Signed)
Patient is a 61 year old female who returns for follow-up today. She underwent right carotid stenting 03/29/2017. This was done for a high-grade a symptom medical right internal carotid artery stenosis. Since the time of her carotid stenting she has complained of bilateral lower extremity pain and weakness. She was seen by our nurse practitioner on July 20 at which time ABIs were ordered to evaluate this as well as an appointment scheduled with neurology. The patient did not want to wait until her neurology appointment so she saw Dr. Jillyn Hidden with [facility] if she had seen in the past. His thoughts were her symptoms were related more to lumbar spine disease. She is seeing him again today at 4 PM. 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 Air was some nerve root compression at several different levels. He states the pain in her legs is relieved with Aleve intermittently.  She is on Lipitor Plavix and aspirin.  Review of systems: She denies shortness of breath. She denies chest pain.  Current Outpatient Prescriptions on File Prior to Visit  Medication Sig Dispense Refill  . albuterol (PROVENTIL HFA;VENTOLIN HFA) 108 (90 BASE) MCG/ACT inhaler Inhale 2 puffs into the lungs every 6 (six) hours as needed for wheezing or shortness of breath. 1 Inhaler 1  . aspirin EC 325 MG EC tablet Take 1 tablet (325 mg total) by mouth daily. 30 tablet   . atorvastatin (LIPITOR) 80 MG tablet Take 80 mg by mouth daily.    . clopidogrel (PLAVIX) 75 MG tablet Take 1 tablet (75 mg total) by mouth daily. 30 tablet 11  . furosemide (LASIX) 40 MG tablet Take 3 tablets (120 mg total) by mouth 2 (two) times daily. 540 tablet 3  . insulin regular human CONCENTRATED (HUMULIN R) 500 UNIT/ML injection INJECT 20 UNITS AT 8AM AND 12 UNITS AT 10PM WITH ADDITIONAL AS NEEDED.  MDD: 65 UNITS/DAY.    [name] Liraglutide 18 MG/3ML SOPN Inject 1.8 mg into  the skin daily.    . potassium chloride SA (K-DUR,KLOR-CON) 20 MEQ tablet Take 2 tablets (40 mEq total) by mouth daily. 180 tablet 3  . Vitamin D, Ergocalciferol, (DRISDOL) 50000 units CAPS capsule Take 50,000 Units by mouth every Monday.      No current facility-administered medications on file prior to visit.      Physical exam:  Vitals:   05/11/17 1028 05/11/17 1031  BP: 121/67 120/71  Pulse: 85 78  Resp: 18   Temp: 97.8 F (36.6 C)   SpO2: 99%   Weight: 298 lb (135.2 kg)   Height: 5\' 6"  (1.676 m)     Neck: Mild soft bilateral carotid bruits  Abdomen: No pulsatile mass, obese  Extremities: 2+ femoral pulses bilaterally absent pedal pulses  Neuro: Symmetric upper extremity and lower extremity motor strength is 5 over 5  Data: Patient had a carotid duplex exam on August 16 which showed less than 50% right internal carotid artery stenosis less than 40% left internal carotid artery stenosis widely patent right carotid stent  She had bilateral ABIs performed which were 0.6 on the right 0.9 on the left  Assessment: #1 carotid occlusive disease widely patent carotid stent doing well status post stenting of the right internal carotid artery. She has mild carotid stenosis on the left side. She will continue her Plavix aspirin and statin. She will follow-up in 6 months for repeat carotid duplex exam.  Leg pain I agree with Dr. Shelle Iron that most likely this is more lumbar spine related although she does have an element of peripheral arterial disease her left leg has minimal findings which certainly would not explain her left leg weakness. Her right ABI was 0.6 which might would cause her some claudication symptoms but this is not related history that she gives.  We will repeat her ABIs when she returns in 6 months time.  Fabienne Bruns, MD [facility] Office: [phone] Pager: [phone]

## 2017-05-17 ENCOUNTER — Encounter: Payer: Self-pay | Admitting: Specialist

## 2017-05-23 NOTE — Addendum Note (Signed)
Addended by: Burton ApleyPETTY, Carl Bleecker A on: 05/23/2017 12:52 PM   Modules accepted: Orders

## 2017-05-25 ENCOUNTER — Ambulatory Visit: Payer: Self-pay | Admitting: Neurology

## 2017-07-06 NOTE — Progress Notes (Signed)
HPI: FU CAD and diastolic CHF. She is status post CABG 08/2011.Echocardiogram February 2016 showed normal LV function and grade 2 diastolic dysfunction. Nuclear study March 2016 showed ejection fraction 53%. There were reversible defects in the anterior and inferior basal wall. Treated medically. Carotid Dopplers April 2018 showed 80-99% right and 1-39% left. Patient had carotid stent placed July 2018. Since she was last seen, patient denies dyspnea, chest pain, palpitations or syncope. She does have occasional pedal edema.  Current Outpatient Prescriptions  Medication Sig Dispense Refill  . albuterol (PROVENTIL HFA;VENTOLIN HFA) 108 (90 BASE) MCG/ACT inhaler Inhale 2 puffs into the lungs every 6 (six) hours as needed for wheezing or shortness of breath. 1 Inhaler 1  . aspirin EC 325 MG EC tablet Take 1 tablet (325 mg total) by mouth daily. 30 tablet   . atorvastatin (LIPITOR) 80 MG tablet Take 80 mg by mouth daily.    . clopidogrel (PLAVIX) 75 MG tablet Take 1 tablet (75 mg total) by mouth daily. 30 tablet 11  . furosemide (LASIX) 40 MG tablet Take 3 tablets (120 mg total) by mouth 2 (two) times daily. 540 tablet 3  . gabapentin (NEURONTIN) 300 MG capsule Take 600 mg by mouth 2 (two) times daily.    . insulin regular human CONCENTRATED (HUMULIN R) 500 UNIT/ML injection INJECT 20 UNITS AT 8AM AND 12 UNITS AT 10PM WITH ADDITIONAL AS NEEDED.  MDD: 65 UNITS/DAY.    [name]. Liraglutide 18 MG/3ML SOPN Inject 1.8 mg into the skin daily.    . potassium chloride SA (K-DUR,KLOR-CON) 20 MEQ tablet Take 2 tablets (40 mEq total) by mouth daily. 180 tablet 3  . Vitamin D, Ergocalciferol, (DRISDOL) 50000 units CAPS capsule Take 50,000 Units by mouth every Monday.      No current facility-administered medications for this visit.      Past Medical History:  Diagnosis Date  . Asthma   . Asthma   . CAD (coronary artery disease)    a. NSTEMI 12/12 -> s/p CABGx3.   . Carotid artery disease (HCC)    a.  Carotid US (08/2013): R 62-79%; L 40-59%; f/u 6 mos.  . Carotid stenosis    a. Carotid US (08/2013):  R 62-79%; L 40-59%; f/u 6 mos  . Chronic diastolic CHF (congestive heart failure) (HCC)    a. Echo 08/2011: mod LVH, nl EF. b. Echo 01/2013: EF 55-60%, mod-sev LVH, mod-sev LA dilitation  . Chronic diastolic heart failure (HCC)    a. Echo 08/2011: mod LVH, nl EF. b. Echo 01/2013: EF 55-60%, mod-sev LVH, mod-sev LA dilitation.  . Complication of anesthesia    '' Myself & my family wake up during surgery "  . Complication of anesthesia   . Coronary artery disease    a. NSTEMI 12/12 -> s/p CABGx3.  [name]. Cough   . Cough    a. 2014: Suspect a combination of CHF, GERD and ACE inhibitor-induced cough.  . Diabetes mellitus   . Diabetes mellitus (HCC)   . History of chicken pox   . HLD (hyperlipidemia)   . HTN (hypertension)   . Hyperlipidemia   . Hypertension   . Morbid obesity (HCC)   . Myocardial infarction (HCC)   . PONV (postoperative nausea and vomiting)   . S/P CABG (coronary artery bypass graft)    a. 2012 - Dr. Laneta SimmersBartle;  LIMA-LAD, SVG-OM 2, SVG-PDA.  . S/P CABG x 3 09/07/11   Dr. Laneta SimmersBartle;   LIMA-LAD, SVG-OM 2, SVG-PDA.  .[name]  Sleep disturbance    a. Had sleep study several years ago, wasn't told she has OSA but told she needs O2 at night.  . Sleep disturbance    Had sleep study several years ago, wasn't told she has OSA but told she needs O2 at night  . Stroke (HCC)   . Stroke [facility]) 2006   denies residual  . TIA (transient ischemic attack)     Past Surgical History:  Procedure Laterality Date  . CAROTID ANGIOGRAPHY N/A 03/17/2017   Procedure: Carotid Angiography;  Surgeon: Sherren Kerns, MD;  Location: [facility] INVASIVE CV LAB;  Service: Cardiovascular;  Laterality: N/A;  . CAROTID PTA/STENT INTERVENTION Right 03/29/2017   Procedure: Carotid PTA/Stent Intervention;  Surgeon: Sherren Kerns, MD;  Location: [facility] INVASIVE CV LAB;  Service: Cardiovascular;  Laterality: Right;  .  CHOLECYSTECTOMY    . CHOLECYSTECTOMY  1990's  . CORONARY ARTERY BYPASS GRAFT    . CORONARY ARTERY BYPASS GRAFT  09/07/2011   CABG X3; Procedure: CORONARY ARTERY BYPASS GRAFTING (CABG);  Surgeon: Alleen Borne, MD;  Location[facility] OR;  Service: Open Heart Surgery;  Laterality: N/A;  . LEFT HEART CATHETERIZATION WITH CORONARY ANGIOGRAM N/A 09/02/2011   Procedure: LEFT HEART CATHETERIZATION WITH CORONARY ANGIOGRAM;  Surgeon: Herby Abraham, MD;  Location: [facility] CATH LAB;  Service: Cardiovascular;  Laterality: N/A;  . TUBAL LIGATION  1981    Social History   Social History  . Marital status: Married    Spouse name: N/A  . Number of children: 3  . Years of education: 95   Occupational History  . Teacher [employer]   Social History Main Topics  . Smoking status: Former Smoker    Packs/day: 0.50    Years: 25.00    Types: Cigarettes    Quit date: 11/11/2011  . Smokeless tobacco: Former Neurosurgeon  . Alcohol use No  . Drug use: No  . Sexual activity: No   Other Topics Concern  . Not on file   Social History Narrative   ** Merged History Encounter **       Regular exercise-yes    Family History  Problem Relation Age of Onset  . Cancer Mother   . Heart failure Mother        Also has hx MVP s/p MVR  . Diabetes Mother   . Coronary artery disease Father        Died of MI at age 59  . Heart disease Other        Parent    ROS: some back pain but no fevers or chills, productive cough, hemoptysis, dysphasia, odynophagia, melena, hematochezia, dysuria, hematuria, rash, seizure activity, orthopnea, PND, pedal edema, claudication. Remaining systems are negative.  Physical Exam: Well-developed morbidly obese in no acute distress.  Skin is warm and dry.  HEENT is normal.  Neck is supple.  Chest is clear to auscultation with normal expansion.  Cardiovascular exam is regular rate and rhythm.  Abdominal exam nontender or distended. No masses palpated. Extremities show trace  edema. neuro grossly intact   A/P  1 Coronary artery disease-patient without chest pain. Continue aspirin and statin.  2 chronic diastolic congestive heart failure-patient appears to be doing reasonably well from a symptomatic standpoint and not significantly volume overloaded on examination. Continue present dose of Lasix. We discussed the importance of low sodium diet and fluid restriction. Check potassium and renal function.  3 hypertension-blood pressure is controlled. Continue present medications.  4 hyperlipidemia-continue statin. Check lipids  and liver.  5 morbid obesity-we discussed the importance of weight loss.  6 carotid artery disease-status post carotid stent-continue aspirin, plavix and statin. Followed by vascular surgery.  Olga Millers, MD

## 2017-07-12 ENCOUNTER — Other Ambulatory Visit: Payer: Self-pay | Admitting: *Deleted

## 2017-07-12 ENCOUNTER — Ambulatory Visit (INDEPENDENT_AMBULATORY_CARE_PROVIDER_SITE_OTHER): Payer: BC Managed Care – PPO | Admitting: Cardiology

## 2017-07-12 ENCOUNTER — Encounter: Payer: Self-pay | Admitting: Cardiology

## 2017-07-12 VITALS — BP 130/82 | HR 80 | Ht 66.0 in | Wt 303.8 lb

## 2017-07-12 DIAGNOSIS — I6523 Occlusion and stenosis of bilateral carotid arteries: Secondary | ICD-10-CM | POA: Diagnosis not present

## 2017-07-12 DIAGNOSIS — I2510 Atherosclerotic heart disease of native coronary artery without angina pectoris: Secondary | ICD-10-CM

## 2017-07-12 DIAGNOSIS — I25810 Atherosclerosis of coronary artery bypass graft(s) without angina pectoris: Secondary | ICD-10-CM

## 2017-07-12 DIAGNOSIS — I5032 Chronic diastolic (congestive) heart failure: Secondary | ICD-10-CM | POA: Diagnosis not present

## 2017-07-12 DIAGNOSIS — I10 Essential (primary) hypertension: Secondary | ICD-10-CM

## 2017-07-12 DIAGNOSIS — E7800 Pure hypercholesterolemia, unspecified: Secondary | ICD-10-CM

## 2017-07-12 LAB — LIPID PANEL
CHOL/HDL RATIO: 5.9 ratio — AB (ref 0.0–4.4)
Cholesterol, Total: 232 mg/dL — ABNORMAL HIGH (ref 100–199)
HDL: 39 mg/dL — AB (ref >39)
LDL CALC: 159 mg/dL — AB (ref 0–99)
Triglycerides: 169 mg/dL — ABNORMAL HIGH (ref 0–149)
VLDL CHOLESTEROL CAL: 34 mg/dL (ref 5–40)

## 2017-07-12 LAB — COMPREHENSIVE METABOLIC PANEL
ALT: 17 IU/L (ref 0–32)
AST: 17 IU/L (ref 0–40)
Albumin/Globulin Ratio: 1.5 (ref 1.2–2.2)
Albumin: 4.3 g/dL (ref 3.6–4.8)
Alkaline Phosphatase: 134 IU/L — ABNORMAL HIGH (ref 39–117)
BILIRUBIN TOTAL: 0.2 mg/dL (ref 0.0–1.2)
BUN / CREAT RATIO: 21 (ref 12–28)
BUN: 23 mg/dL (ref 8–27)
CHLORIDE: 100 mmol/L (ref 96–106)
CO2: 28 mmol/L (ref 20–29)
CREATININE: 1.12 mg/dL — AB (ref 0.57–1.00)
Calcium: 10.2 mg/dL (ref 8.7–10.3)
GFR calc Af Amer: 61 mL/min/{1.73_m2} (ref >59)
GFR calc non Af Amer: 53 mL/min/{1.73_m2} — ABNORMAL LOW (ref >59)
GLUCOSE: 34 mg/dL — AB (ref 65–99)
Globulin, Total: 2.9 g/dL (ref 1.5–4.5)
Potassium: 4.8 mmol/L (ref 3.5–5.2)
SODIUM: 147 mmol/L — AB (ref 134–144)
Total Protein: 7.2 g/dL (ref 6.0–8.5)

## 2017-07-12 MED ORDER — CLOPIDOGREL BISULFATE 75 MG PO TABS: 75.0000 mg | ORAL_TABLET | Freq: Every day | ORAL | 3 refills | Status: DC

## 2017-07-12 MED ORDER — POTASSIUM CHLORIDE CRYS ER 20 MEQ PO TBCR: 40.0000 meq | EXTENDED_RELEASE_TABLET | Freq: Every day | ORAL | 3 refills | Status: DC

## 2017-07-12 MED ORDER — ATORVASTATIN CALCIUM 80 MG PO TABS: 80.0000 mg | ORAL_TABLET | Freq: Every day | ORAL | 3 refills | Status: DC

## 2017-07-12 MED ORDER — FUROSEMIDE 40 MG PO TABS: 120.0000 mg | ORAL_TABLET | Freq: Two times a day (BID) | ORAL | 3 refills | Status: DC

## 2017-07-12 NOTE — Patient Instructions (Signed)

## 2017-07-13 ENCOUNTER — Telehealth: Payer: Self-pay | Admitting: *Deleted

## 2017-07-13 DIAGNOSIS — Z79899 Other long term (current) drug therapy: Secondary | ICD-10-CM

## 2017-07-13 NOTE — Telephone Encounter (Addendum)
-----   Message from Lewayne BuntingBrian S Crenshaw, MD sent at 07/12/2017  5:22 PM EDT ----- Is she taking lipitor; if not resume with lipids and liver 4 weeks; if she is, refer to lipid clinic Olga MillersBrian Crenshaw   Left message for pt to call

## 2017-07-17 NOTE — Telephone Encounter (Signed)
Pt returning Debra's call from  07-13-17.

## 2017-07-17 NOTE — Telephone Encounter (Signed)
Pt.notified

## 2017-08-09 ENCOUNTER — Telehealth: Payer: Self-pay | Admitting: *Deleted

## 2017-08-09 DIAGNOSIS — E7800 Pure hypercholesterolemia, unspecified: Secondary | ICD-10-CM

## 2017-08-09 NOTE — Telephone Encounter (Signed)
-----   Message from Lewayne BuntingBrian S Crenshaw, MD sent at 07/12/2017  5:22 PM EDT ----- Is she taking lipitor; if not resume with lipids and liver 4 weeks; if she is, refer to lipid clinic Olga MillersBrian Crenshaw

## 2017-08-09 NOTE — Telephone Encounter (Signed)
Spoke with pt, she reports she was off the lipitor when the lab work was drawn and restarted it the day she was seen. Will mail her the paperwork for labs.

## 2017-09-14 ENCOUNTER — Encounter: Payer: Self-pay | Admitting: *Deleted

## 2017-10-16 LAB — LIPID PANEL
CHOL/HDL RATIO: 4.1 ratio (ref 0.0–4.4)
Cholesterol, Total: 139 mg/dL (ref 100–199)
HDL: 34 mg/dL — AB (ref >39)
LDL CALC: 80 mg/dL (ref 0–99)
TRIGLYCERIDES: 127 mg/dL (ref 0–149)
VLDL Cholesterol Cal: 25 mg/dL (ref 5–40)

## 2017-10-16 LAB — HEPATIC FUNCTION PANEL
ALT: 26 IU/L (ref 0–32)
AST: 25 IU/L (ref 0–40)
Albumin: 4.1 g/dL (ref 3.6–4.8)
Alkaline Phosphatase: 132 IU/L — ABNORMAL HIGH (ref 39–117)
Bilirubin Total: 0.3 mg/dL (ref 0.0–1.2)
Bilirubin, Direct: 0.11 mg/dL (ref 0.00–0.40)
TOTAL PROTEIN: 6.6 g/dL (ref 6.0–8.5)

## 2017-10-17 ENCOUNTER — Encounter: Payer: Self-pay | Admitting: *Deleted

## 2017-11-16 ENCOUNTER — Ambulatory Visit: Payer: Self-pay | Admitting: Family

## 2017-11-16 ENCOUNTER — Encounter (HOSPITAL_COMMUNITY): Payer: Self-pay

## 2017-12-12 ENCOUNTER — Ambulatory Visit: Payer: BC Managed Care – PPO | Admitting: Family

## 2017-12-12 ENCOUNTER — Ambulatory Visit (HOSPITAL_COMMUNITY)
Admission: RE | Admit: 2017-12-12 | Discharge: 2017-12-12 | Disposition: A | Discharge status: Home / Self Care | Payer: BC Managed Care – PPO | Source: Ambulatory Visit | Attending: Vascular Surgery | Admitting: Vascular Surgery

## 2017-12-12 ENCOUNTER — Other Ambulatory Visit: Payer: Self-pay

## 2017-12-12 ENCOUNTER — Ambulatory Visit (INDEPENDENT_AMBULATORY_CARE_PROVIDER_SITE_OTHER)
Admission: RE | Admit: 2017-12-12 | Discharge: 2017-12-12 | Disposition: A | Discharge status: Home / Self Care | Payer: BC Managed Care – PPO | Source: Ambulatory Visit | Attending: Vascular Surgery | Admitting: Vascular Surgery

## 2017-12-12 ENCOUNTER — Encounter: Payer: Self-pay | Admitting: Family

## 2017-12-12 VITALS — BP 119/72 | HR 69 | Temp 97.3°F | Resp 16 | Ht 66.0 in | Wt 311.0 lb

## 2017-12-12 DIAGNOSIS — I6523 Occlusion and stenosis of bilateral carotid arteries: Secondary | ICD-10-CM | POA: Diagnosis not present

## 2017-12-12 DIAGNOSIS — I739 Peripheral vascular disease, unspecified: Secondary | ICD-10-CM | POA: Insufficient documentation

## 2017-12-12 DIAGNOSIS — Z95828 Presence of other vascular implants and grafts: Secondary | ICD-10-CM | POA: Diagnosis not present

## 2017-12-12 MED ORDER — CLOPIDOGREL BISULFATE 75 MG PO TABS: 75.0000 mg | ORAL_TABLET | Freq: Every day | ORAL | 3 refills | Status: DC

## 2017-12-12 NOTE — Patient Instructions (Signed)
Stroke Prevention Some health problems and behaviors may make it more likely for you to have a stroke. Below are ways to lessen your risk of having a stroke.  Be active for at least 30 minutes on most or all days.  Do not smoke. Try not to be around others who smoke.  Do not drink too much alcohol. ? Do not have more than 2 drinks a day if you are a man. ? Do not have more than 1 drink a day if you are a woman and are not pregnant.  Eat healthy foods, such as fruits and vegetables. If you were put on a specific diet, follow the diet as told.  Keep your cholesterol levels under control through diet and medicines. Look for foods that are low in saturated fat, trans fat, cholesterol, and are high in fiber.  If you have diabetes, follow all diet plans and take your medicine as told.  Ask your doctor if you need treatment to lower your blood pressure. If you have high blood pressure (hypertension), follow all diet plans and take your medicine as told by your doctor.  If you are 18-39 years old, have your blood pressure checked every 3-5 years. If you are age 40 or older, have your blood pressure checked every year.  Keep a healthy weight. Eat foods that are low in calories, salt, saturated fat, trans fat, and cholesterol.  Do not take drugs.  Avoid birth control pills, if this applies. Talk to your doctor about the risks of taking birth control pills.  Talk to your doctor if you have sleep problems (sleep apnea).  Take all medicine as told by your doctor. ? You may be told to take aspirin or blood thinner medicine. Take this medicine as told by your doctor. ? Understand your medicine instructions.  Make sure any other conditions you have are being taken care of.  Get help right away if:  You suddenly lose feeling (you feel numb) or have weakness in your face, arm, or leg.  Your face or eyelid hangs down to one side.  You suddenly feel confused.  You have trouble talking  (aphasia) or understanding what people are saying.  You suddenly have trouble seeing in one or both eyes.  You suddenly have trouble walking.  You are dizzy.  You lose your balance or your movements are clumsy (uncoordinated).  You suddenly have a very bad headache and you do not know the cause.  You have new chest pain.  Your heart feels like it is fluttering or skipping a beat (irregular heartbeat). Do not wait to see if the symptoms above go away. Get help right away. Call your local emergency services (911 in [place] Do not drive yourself to the hospital. This information is not intended to replace advice given to you by your health care provider. Make sure you discuss any questions you have with your health care provider. Document Released: 03/06/2012 Document Revised: 02/11/2016 Document Reviewed: 03/08/2013 Elsevier Interactive Patient Education  2018 Elsevier Inc.     Peripheral Vascular Disease Peripheral vascular disease (PVD) is a disease of the blood vessels that are not part of your heart and brain. A simple term for PVD is poor circulation. In most cases, PVD narrows the blood vessels that carry blood from your heart to the rest of your body. This can result in a decreased supply of blood to your arms, legs, and internal organs, like your stomach or kidneys. However, it most often affects   a person's lower legs and feet. There are two types of PVD.  Organic PVD. This is the more common type. It is caused by damage to the structure of blood vessels.  Functional PVD. This is caused by conditions that make blood vessels contract and tighten (spasm).  Without treatment, PVD tends to get worse over time. PVD can also lead to acute ischemic limb. This is when an arm or limb suddenly has trouble getting enough blood. This is a medical emergency. Follow these instructions at home:  Take medicines only as told by your doctor.  Do not use any tobacco products, including  cigarettes, chewing tobacco, or electronic cigarettes. If you need help quitting, ask your doctor.  Lose weight if you are overweight, and maintain a healthy weight as told by your doctor.  Eat a diet that is low in fat and cholesterol. If you need help, ask your doctor.  Exercise regularly. Ask your doctor for some good activities for you.  Take good care of your feet. ? Wear comfortable shoes that fit well. ? Check your feet often for any cuts or sores. Contact a doctor if:  You have cramps in your legs while walking.  You have leg pain when you are at rest.  You have coldness in a leg or foot.  Your skin changes.  You are unable to get or have an erection (erectile dysfunction).  You have cuts or sores on your feet that are not healing. Get help right away if:  Your arm or leg turns cold and blue.  Your arms or legs become red, warm, swollen, painful, or numb.  You have chest pain or trouble breathing.  You suddenly have weakness in your face, arm, or leg.  You become very confused or you cannot speak.  You suddenly have a very bad headache.  You suddenly cannot see. This information is not intended to replace advice given to you by your health care provider. Make sure you discuss any questions you have with your health care provider. Document Released: 11/30/2009 Document Revised: 02/11/2016 Document Reviewed: 02/13/2014 Elsevier Interactive Patient Education  2017 Elsevier Inc.  

## 2017-12-12 NOTE — Progress Notes (Signed)
[facility] [place] HISTORY AND PHYSICAL   CC: Follow up extracranial carotid artery stenosis and peripheral artery occlusive disease    History of Present Illness:   Felicia Rivas is a 62 y.o. female who is s/p right carotid angioplasty and stenting on 03-29-17 by Dr. Darrick PennaFields for high-grade asymptomatic right internal carotid artery stenosis.  Dr. Darrick PennaFields initially evaluated pt on 02-23-17.   The carotid stenosis was found on surveillance carotid duplex.  Medical problems include DM insulin dependent, HTN beta blocker daily, CAD s/p CABG, left hemiparesis age 62 from stroke now completely recovered, and hypercholesterolemia managed with Lipitor daily. These are currently stable and followed by Dr. Jens Somrenshaw.    She was evaluated by Dr. Darrick PennaFields on 05-11-17. At that time carotid duplex exam on August 16 showed less than 50% right internal carotid artery stenosis and less than 40% left internal carotid artery stenosis, widely patent right carotid stent. She had bilateral ABIs performed which were 0.6 on the right 0.9 on the left She was to continue her Plavix aspirin and statin. She was to follow-up in 6 months for repeat carotid duplex exam. Dr. Darrick PennaFields agreed with Dr. Shelle IronBeane that most likely her legs weakness is more lumbar spine related although she has an element of peripheral arterial disease her left leg has minimal findings which certainly would not explain her left leg weakness. Her right ABI was 0.6 which might would cause her some claudication symptoms but this is not related history that she gives.  ABIs when she returns in 6 months time.  She received an ESI whch helped the unsteadiness in her legs, feels like the benefit is wearing off.   Diabetic: Yes, states her last A1C is 7.5 in October 2018 Tobacco use: former smoker, quit in 2013   Pt meds include: Statin :Yes Betablocker: No ASA: Yes Other anticoagulants/antiplatelets: Plavix  Current Outpatient  Medications  Medication Sig Dispense Refill  . albuterol (PROVENTIL HFA;VENTOLIN HFA) 108 (90 BASE) MCG/ACT inhaler Inhale 2 puffs into the lungs every 6 (six) hours as needed for wheezing or shortness of breath. 1 Inhaler 1  . aspirin EC 325 MG EC tablet Take 1 tablet (325 mg total) by mouth daily. 30 tablet   . atorvastatin (LIPITOR) 80 MG tablet Take 1 tablet (80 mg total) by mouth daily. 90 tablet 3  . clopidogrel (PLAVIX) 75 MG tablet Take 1 tablet (75 mg total) by mouth daily. 90 tablet 3  . furosemide (LASIX) 40 MG tablet Take 3 tablets (120 mg total) by mouth 2 (two) times daily. 540 tablet 3  . gabapentin (NEURONTIN) 300 MG capsule Take 600 mg by mouth 3 (three) times daily.     . insulin regular human CONCENTRATED (HUMULIN R) 500 UNIT/ML injection INJECT 20 UNITS AT 8AM AND 12 UNITS AT 10PM WITH ADDITIONAL AS NEEDED.  MDD: 65 UNITS/DAY.    [name]. Liraglutide 18 MG/3ML SOPN Inject 1.8 mg into the skin daily.    . potassium chloride SA (K-DUR,KLOR-CON) 20 MEQ tablet Take 2 tablets (40 mEq total) by mouth daily. 180 tablet 3  . Vitamin D, Ergocalciferol, (DRISDOL) 50000 units CAPS capsule Take 50,000 Units by mouth every Monday.      No current facility-administered medications for this visit.     Past Medical History:  Diagnosis Date  . Asthma   . Asthma   . CAD (coronary artery disease)    a. NSTEMI 12/12 -> s/p CABGx3.   . Carotid artery disease (HCC)  a. Carotid US (08/2013): R 60-79%; L 40-59%; f/u 6 mos.  . Carotid stenosis    a. Carotid US (08/2013):  R 60-79%; L 40-59%; f/u 6 mos  . Chronic diastolic CHF (congestive heart failure) (HCC)    a. Echo 08/2011: mod LVH, nl EF. b. Echo 01/2013: EF 55-60%, mod-sev LVH, mod-sev LA dilitation  . Chronic diastolic heart failure (HCC)    a. Echo 08/2011: mod LVH, nl EF. b. Echo 01/2013: EF 55-60%, mod-sev LVH, mod-sev LA dilitation.  . Complication of anesthesia    '' Myself & my family wake up during surgery "  . Complication of  anesthesia   . Coronary artery disease    a. NSTEMI 12/12 -> s/p CABGx3.  [name] Cough   . Cough    a. 2014: Suspect a combination of CHF, GERD and ACE inhibitor-induced cough.  . Diabetes mellitus   . Diabetes mellitus (HCC)   . History of chicken pox   . HLD (hyperlipidemia)   . HTN (hypertension)   . Hyperlipidemia   . Hypertension   . Morbid obesity (HCC)   . Myocardial infarction (HCC)   . PONV (postoperative nausea and vomiting)   . S/P CABG (coronary artery bypass graft)    a. 2012 - Dr. Laneta Simmers;  LIMA-LAD, SVG-OM 2, SVG-PDA.  . S/P CABG x 3 09/07/11   Dr. Laneta Simmers;   LIMA-LAD, SVG-OM 2, SVG-PDA.  . Sleep disturbance    a. Had sleep study several years ago, wasn't told she has OSA but told she needs O2 at night.  . Sleep disturbance    Had sleep study several years ago, wasn't told she has OSA but told she needs O2 at night  . Stroke (HCC)   . Stroke [facility]) 2006   denies residual  . TIA (transient ischemic attack)     Social History Social History   Tobacco Use  . Smoking status: Former Smoker    Packs/day: 0.50    Years: 25.00    Pack years: 12.50    Types: Cigarettes    Last attempt to quit: 11/11/2011    Years since quitting: 6.0  . Smokeless tobacco: Former Engineer, water Use Topics  . Alcohol use: No  . Drug use: No    Family History Family History  Problem Relation Age of Onset  . Cancer Mother   . Heart failure Mother        Also has hx MVP s/p MVR  . Diabetes Mother   . Coronary artery disease Father        Died of MI at age 79  . Heart disease Other        Parent    Surgical History Past Surgical History:  Procedure Laterality Date  . CAROTID ANGIOGRAPHY N/A 03/17/2017   Procedure: Carotid Angiography;  Surgeon: Sherren Kerns, MD;  Location: [facility] INVASIVE CV LAB;  Service: Cardiovascular;  Laterality: N/A;  . CAROTID PTA/STENT INTERVENTION Right 03/29/2017   Procedure: Carotid PTA/Stent Intervention;  Surgeon: Sherren Kerns, MD;  Location:  [facility] INVASIVE CV LAB;  Service: Cardiovascular;  Laterality: Right;  . CHOLECYSTECTOMY    . CHOLECYSTECTOMY  1990's  . CORONARY ARTERY BYPASS GRAFT    . CORONARY ARTERY BYPASS GRAFT  09/07/2011   CABG X3; Procedure: CORONARY ARTERY BYPASS GRAFTING (CABG);  Surgeon: Alleen Borne, MD;  Location: [facility] OR;  Service: Open Heart Surgery;  Laterality: N/A;  . LEFT HEART CATHETERIZATION WITH CORONARY ANGIOGRAM N/A 09/02/2011   Procedure: LEFT  HEART CATHETERIZATION WITH CORONARY ANGIOGRAM;  Surgeon: Herby Abraham, MD;  Location: [facility] CATH LAB;  Service: Cardiovascular;  Laterality: N/A;  . TUBAL LIGATION  1981    Allergies  Allergen Reactions  . Lisinopril Cough, Nausea And Vomiting and Other (See Comments)    ACE Inhibitor Cough ?Cough - changed to ARB 02/2013. ACE Inhibitor Cough ACE Inhibitor Cough ?Cough - changed to ARB 02/2013. ACE Inhibitor Cough ?Cough - changed to ARB 02/2013.  [name] Other Hives    Unknown IV antibiotic    Current Outpatient Medications  Medication Sig Dispense Refill  . albuterol (PROVENTIL HFA;VENTOLIN HFA) 108 (90 BASE) MCG/ACT inhaler Inhale 2 puffs into the lungs every 6 (six) hours as needed for wheezing or shortness of breath. 1 Inhaler 1  . aspirin EC 325 MG EC tablet Take 1 tablet (325 mg total) by mouth daily. 30 tablet   . atorvastatin (LIPITOR) 80 MG tablet Take 1 tablet (80 mg total) by mouth daily. 90 tablet 3  . clopidogrel (PLAVIX) 75 MG tablet Take 1 tablet (75 mg total) by mouth daily. 90 tablet 3  . furosemide (LASIX) 40 MG tablet Take 3 tablets (120 mg total) by mouth 2 (two) times daily. 540 tablet 3  . gabapentin (NEURONTIN) 300 MG capsule Take 600 mg by mouth 3 (three) times daily.     . insulin regular human CONCENTRATED (HUMULIN R) 500 UNIT/ML injection INJECT 20 UNITS AT 8AM AND 12 UNITS AT 10PM WITH ADDITIONAL AS NEEDED.  MDD: 65 UNITS/DAY.    [name] Liraglutide 18 MG/3ML SOPN Inject 1.8 mg into the skin daily.    . potassium chloride SA  (K-DUR,KLOR-CON) 20 MEQ tablet Take 2 tablets (40 mEq total) by mouth daily. 180 tablet 3  . Vitamin D, Ergocalciferol, (DRISDOL) 50000 units CAPS capsule Take 50,000 Units by mouth every Monday.      No current facility-administered medications for this visit.      REVIEW OF SYSTEMS: See HPI for pertinent positives and negatives.  Physical Examination Vitals:   12/12/17 1600 12/12/17 1608  BP: 112/64 119/72  Pulse: 69 69  Resp: 16   Temp: (!) 97.3 F (36.3 C)   TempSrc: Oral   SpO2: 96%   Weight: (!) 311 lb (141.1 kg)   Height: 5\' 6"  (1.676 m)    Body mass index is 50.2 kg/m.  General:  WDWN morbidly obese female in NAD Gait: steady, using cane HENT: WNL Eyes: PERRLA Pulmonary: normal non-labored breathing, distant breath sounds, CTAB, no rales, rhonchi, or wheezing Cardiac: RRR, no murmur detected Abdomen: soft, NT, no masses palpated Skin: no rashes, no ulcers, no cellulitis.   VASCULAR EXAM  Carotid Bruits Right Left   Negative Negative      Radial pulses are 2+ palpable bilaterally   Adominal aortic pulse is not palpable                      VASCULAR EXAM: Extremities without ischemic changes,  without Gangrene; without open wounds. Trace and 1+ non pitting edema in ankles and feet. Mycotic toenails.  LE Pulses Right Left       FEMORAL  2+ palpable  2+ palpable        POPLITEAL  not palpable   not palpable       POSTERIOR TIBIAL  not palpable   not palpable        DORSALIS PEDIS      ANTERIOR TIBIAL not palpable  faintly palpable     Musculoskeletal: no muscle wasting or atrophy; no peripheral edema  Neurologic:  A&O X 3; appropriate affect, sensation is normal; speech is normal, CN 2-12 intact, pain and light touch intact in extremities, motor exam as listed above. Psychiatric: Normal thought content, mood appropriate to clinical situation.      ASSESSMENT:  Felicia Rivas is a 62 y.o. female who is s/p right carotid angioplasty and stenting on 03-29-17.  She had a stroke at age 61, none subsequently.  We are also monitoring her moderate and mild peripheral artery occlusive disease in her legs.   Her lumbar spine issues cause weakness in her legs, in addiation to that her BMI is 50, and she does not seem to walk enough to elicit claudication symptoms.  There are no signs of ischemia in her feet or legs.    DATA  Carotid Duplex (12/12/17): Right ICA: no significant in stent stenosis; 60-79% stenosis distal to the stent in the mid to distal segment. Left ICA: 1-39% stenosis Bilateral ECA >50% stenosis Bilateral vertebral artery flow is antegrade.  Bilateral subclavian artery waveforms are normal.  Increased stenosis distal to the right carotid stent compared to the last exam on 05-04-17.    ABI (Date: 12/12/2017):  R:   ABI: 0.59 (was 0.62 on 05-04-17),   PT: mono  DP: mono  TBI:  0.31 (was 0.30)  L:   ABI: 0.91 (was 0.91),   PT: mono  DP: mono  TBI: 0.51 (was 0.50)  Stable bilateral ABI and TBI, all monophasic waveforms; moderate disease in the right, mild in the left.   PLAN:   Based on today's exam and non-invasive vascular lab results, and after discussing with Dr. Arbie Cookey, the patient will follow up in 6 months with with the following tests: carotid duplex and ABI's, see Dr. Darrick Penna afterward.   I discussed in depth with the patient the nature of atherosclerosis, and emphasized the importance of maximal medical management including strict control of blood pressure, blood glucose, and lipid levels, obtaining regular exercise, and cessation of smoking.  The patient is aware that without maximal medical management the underlying atherosclerotic disease process will progress, limiting the benefit of any interventions.  The patient was given information about stroke prevention and what symptoms should prompt  the patient to seek immediate medical care.  The patient was given information about PAD including signs, symptoms, treatment, what symptoms should prompt the patient to seek immediate medical care, and risk reduction measures to take.  Thank you for allowing [place] to participate in this patient's care.  Charisse March, RN, MSN, FNP-C [facility] Office: [phone]  Clinic MD: Early 12/12/2017 4:33 PM

## 2017-12-13 ENCOUNTER — Telehealth: Payer: Self-pay | Admitting: Vascular Surgery

## 2017-12-13 NOTE — Telephone Encounter (Signed)
-----   Message from Annye RuskSuzanne L Nickel, NP sent at 12/12/2017  5:47 PM EDT ----- Regarding: follow up with Dr. Darrick PennaFields instead of Rosalita ChessmanSuzanne  Please have pt follow up with Dr. Darrick PennaFields in 6 months after her non invasive testing instead of me.  Thanks,  Rosalita ChessmanSuzanne

## 2017-12-13 NOTE — Telephone Encounter (Signed)
Per suzanne changed appt to [facility]  12/13/17

## 2017-12-21 ENCOUNTER — Other Ambulatory Visit: Payer: Self-pay

## 2017-12-21 DIAGNOSIS — I739 Peripheral vascular disease, unspecified: Secondary | ICD-10-CM

## 2017-12-21 DIAGNOSIS — I6523 Occlusion and stenosis of bilateral carotid arteries: Secondary | ICD-10-CM

## 2018-01-19 DIAGNOSIS — R29898 Other symptoms and signs involving the musculoskeletal system: Secondary | ICD-10-CM | POA: Insufficient documentation

## 2018-02-08 DIAGNOSIS — M5136 Other intervertebral disc degeneration, lumbar region: Secondary | ICD-10-CM | POA: Insufficient documentation

## 2018-02-26 ENCOUNTER — Telehealth: Payer: Self-pay | Admitting: Cardiology

## 2018-02-26 NOTE — Telephone Encounter (Signed)
Spoke with pt, she reports her weight is up 20 lbs since Thursday last week. She has a wet cough and is coughing up bubbles. Since Friday she has had to sit up to breath to sleep. She has taken extra furosemide, 4 tablets in the am and 4 tablets in the pm since Friday with no change in her weight or cough. Advised the patient to go to [place] [facility] for evaluation. Patient voiced understanding.

## 2018-02-26 NOTE — Telephone Encounter (Signed)
NEW MESSAGE    Patient calling with concerns of coughing.  SOB after coughing episodes. Unable to lay in bed flat. No chest pain

## 2018-03-19 DIAGNOSIS — E113219 Type 2 diabetes mellitus with mild nonproliferative diabetic retinopathy with macular edema, unspecified eye: Secondary | ICD-10-CM | POA: Insufficient documentation

## 2018-03-19 DIAGNOSIS — E1165 Type 2 diabetes mellitus with hyperglycemia: Secondary | ICD-10-CM

## 2018-03-19 DIAGNOSIS — F5104 Psychophysiologic insomnia: Secondary | ICD-10-CM | POA: Insufficient documentation

## 2018-03-19 DIAGNOSIS — IMO0002 Reserved for concepts with insufficient information to code with codable children: Secondary | ICD-10-CM | POA: Insufficient documentation

## 2018-03-29 ENCOUNTER — Encounter: Payer: Self-pay | Admitting: Cardiology

## 2018-03-29 LAB — HM DIABETES EYE EXAM

## 2018-04-27 DIAGNOSIS — E1142 Type 2 diabetes mellitus with diabetic polyneuropathy: Secondary | ICD-10-CM | POA: Insufficient documentation

## 2018-06-14 ENCOUNTER — Ambulatory Visit: Payer: Self-pay | Admitting: Vascular Surgery

## 2018-06-14 ENCOUNTER — Encounter (HOSPITAL_COMMUNITY): Payer: Self-pay

## 2018-06-14 ENCOUNTER — Ambulatory Visit: Payer: Self-pay | Admitting: Family

## 2018-06-28 ENCOUNTER — Ambulatory Visit: Payer: BC Managed Care – PPO | Admitting: Vascular Surgery

## 2018-06-28 ENCOUNTER — Other Ambulatory Visit: Payer: Self-pay

## 2018-06-28 ENCOUNTER — Ambulatory Visit (INDEPENDENT_AMBULATORY_CARE_PROVIDER_SITE_OTHER)
Admission: RE | Admit: 2018-06-28 | Discharge: 2018-06-28 | Disposition: A | Discharge status: Home / Self Care | Payer: BC Managed Care – PPO | Source: Ambulatory Visit | Attending: Family | Admitting: Family

## 2018-06-28 ENCOUNTER — Encounter: Payer: Self-pay | Admitting: Vascular Surgery

## 2018-06-28 ENCOUNTER — Ambulatory Visit (HOSPITAL_COMMUNITY)
Admission: RE | Admit: 2018-06-28 | Discharge: 2018-06-28 | Disposition: A | Discharge status: Home / Self Care | Payer: BC Managed Care – PPO | Source: Ambulatory Visit | Attending: Family | Admitting: Family

## 2018-06-28 VITALS — BP 123/63 | HR 74 | Temp 98.6°F | Resp 22 | Ht 66.0 in | Wt 313.0 lb

## 2018-06-28 DIAGNOSIS — I6523 Occlusion and stenosis of bilateral carotid arteries: Secondary | ICD-10-CM

## 2018-06-28 DIAGNOSIS — I739 Peripheral vascular disease, unspecified: Secondary | ICD-10-CM | POA: Diagnosis present

## 2018-06-28 NOTE — Progress Notes (Signed)
Patient is a 62 year old female who returns for follow-up today.  She has previously undergone right carotid stent July 2018.  She continues to deny any symptoms of TIA amaurosis or stroke.  We are also following her for peripheral arterial disease.  She has a lot of back problems that limit her walking.  She does not really describe claudication or rest pain.  She has no nonhealing wounds.  Chronic medical problems remain arthritis hypertension hyperlipidemia diabetes coronary artery disease.  These are all currently stable.  She is on Plavix and a statin. She does not describe upper extremity exertional fatigue or dizziness.  Review of systems: She has shortness of breath with exertion.  She denies chest pain.  Past Surgical History:  Procedure Laterality Date  . CAROTID ANGIOGRAPHY N/A 03/17/2017   Procedure: Carotid Angiography;  Surgeon: Sherren Kerns, MD;  Location: [facility] INVASIVE CV LAB;  Service: Cardiovascular;  Laterality: N/A;  . CAROTID PTA/STENT INTERVENTION Right 03/29/2017   Procedure: Carotid PTA/Stent Intervention;  Surgeon: Sherren Kerns, MD;  Location: [facility] INVASIVE CV LAB;  Service: Cardiovascular;  Laterality: Right;  . CHOLECYSTECTOMY    . CHOLECYSTECTOMY  1990's  . CORONARY ARTERY BYPASS GRAFT    . CORONARY ARTERY BYPASS GRAFT  09/07/2011   CABG X3; Procedure: CORONARY ARTERY BYPASS GRAFTING (CABG);  Surgeon: Alleen Borne, MD;  Location: [facility] OR;  Service: Open Heart Surgery;  Laterality: N/A;  . LEFT HEART CATHETERIZATION WITH CORONARY ANGIOGRAM N/A 09/02/2011   Procedure: LEFT HEART CATHETERIZATION WITH CORONARY ANGIOGRAM;  Surgeon: Herby Abraham, MD;  Location: [facility] CATH LAB;  Service: Cardiovascular;  Laterality: N/A;  . TUBAL LIGATION  1981   Current Outpatient Medications on File Prior to Visit  Medication Sig Dispense Refill  . albuterol (PROVENTIL HFA;VENTOLIN HFA) 108 (90 BASE) MCG/ACT inhaler Inhale 2 puffs into the lungs every 6 (six) hours as needed for  wheezing or shortness of breath. 1 Inhaler 1  . aspirin EC 325 MG EC tablet Take 1 tablet (325 mg total) by mouth daily. 30 tablet   . atorvastatin (LIPITOR) 80 MG tablet Take 1 tablet (80 mg total) by mouth daily. 90 tablet 3  . clopidogrel (PLAVIX) 75 MG tablet Take 1 tablet (75 mg total) by mouth daily. 90 tablet 3  . furosemide (LASIX) 40 MG tablet Take 3 tablets (120 mg total) by mouth 2 (two) times daily. 540 tablet 3  . gabapentin (NEURONTIN) 300 MG capsule Take 600 mg by mouth 3 (three) times daily.     . insulin regular human CONCENTRATED (HUMULIN R) 500 UNIT/ML injection INJECT 20 UNITS AT 8AM AND 12 UNITS AT 10PM WITH ADDITIONAL AS NEEDED.  MDD: 65 UNITS/DAY.    [name] Liraglutide 18 MG/3ML SOPN Inject 1.8 mg into the skin daily.    . potassium chloride SA (K-DUR,KLOR-CON) 20 MEQ tablet Take 2 tablets (40 mEq total) by mouth daily. 180 tablet 3  . Vitamin D, Ergocalciferol, (DRISDOL) 50000 units CAPS capsule Take 50,000 Units by mouth every Monday.      No current facility-administered medications on file prior to visit.     Past Medical History:  Diagnosis Date  . Asthma   . Asthma   . CAD (coronary artery disease)    a. NSTEMI 12/12 -> s/p CABGx3.   . Carotid artery disease (HCC)    a. Carotid US (08/2013): R 60-79%; L 40-59%; f/u 6 mos.  . Carotid stenosis    a. Carotid US (08/2013):  R 60-79%; L  40-59%; f/u 6 mos  . Chronic diastolic CHF (congestive heart failure) (HCC)    a. Echo 08/2011: mod LVH, nl EF. b. Echo 01/2013: EF 55-60%, mod-sev LVH, mod-sev LA dilitation  . Chronic diastolic heart failure (HCC)    a. Echo 08/2011: mod LVH, nl EF. b. Echo 01/2013: EF 55-60%, mod-sev LVH, mod-sev LA dilitation.  . Complication of anesthesia    '' Myself & my family wake up during surgery "  . Complication of anesthesia   . Coronary artery disease    a. NSTEMI 12/12 -> s/p CABGx3.  [name] Cough   . Cough    a. 2014: Suspect a combination of CHF, GERD and ACE inhibitor-induced cough.   . Diabetes mellitus   . Diabetes mellitus (HCC)   . History of chicken pox   . HLD (hyperlipidemia)   . HTN (hypertension)   . Hyperlipidemia   . Hypertension   . Morbid obesity (HCC)   . Myocardial infarction (HCC)   . PONV (postoperative nausea and vomiting)   . S/P CABG (coronary artery bypass graft)    a. 2012 - Dr. Laneta Simmers;  LIMA-LAD, SVG-OM 2, SVG-PDA.  . S/P CABG x 3 09/07/11   Dr. Laneta Simmers;   LIMA-LAD, SVG-OM 2, SVG-PDA.  . Sleep disturbance    a. Had sleep study several years ago, wasn't told she has OSA but told she needs O2 at night.  . Sleep disturbance    Had sleep study several years ago, wasn't told she has OSA but told she needs O2 at night  . Stroke (HCC)   . Stroke [facility]) 2006   denies residual  . TIA (transient ischemic attack)      Physical exam:  Vitals:   06/28/18 1019 06/28/18 1023  BP: 125/62 123/63  Pulse: 74   Resp: (!) 22   Temp: 98.6 F (37 C)   TempSrc: Oral   SpO2: 95%   Weight: (!) 313 lb (142 kg)   Height: 5\' 6"  (1.676 m)     Neck: Bilateral carotid bruits  Chest: Clear to auscultation bilaterally  Cardiac: Regular rate and rhythm  Extremities: Thickened sharpened edge toenails bilateral feet no open wounds or ulcers no palpable pedal pulses  Data: Patient had bilateral carotid duplex scan today.  Reviewed his duplex ultrasound 6 months ago has suggested recurrent stenosis distal to her stent.  However velocities today distal to the stent were 106/44 seen her in the 40 to 60% stenosis category.  Left side was also 40 to 60%.  She did have some turbulence in the subclavian arteries bilaterally suggestive of mild to moderate stenosis.  She had bilateral ABIs performed today which were 0.54 on the right 0.83 on the left essentially the same as 6 months ago.  Assessment: Mild recurrent stenosis distal to her right internal carotid artery stent.  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  She will see our nurse practitioner at that office visit.  She will continue her Plavix and statin.  Fabienne Bruns, MD [facility] [place] Office: [phone] Pager: [phone]

## 2018-07-02 ENCOUNTER — Telehealth: Payer: Self-pay | Admitting: Cardiology

## 2018-07-02 NOTE — Telephone Encounter (Signed)
Follow up    Correction the appt is on 10/29

## 2018-07-02 NOTE — Telephone Encounter (Signed)
New Message   Pt c/o swelling: STAT is pt has developed SOB within 24 hours  1) How much weight have you gained and in what time span? 10lb in one week   2) If swelling, where is the swelling located? Both legs  3) Are you currently taking a fluid pill? yes  Are you currently SOB? No  4) Do you have a log of your daily weights (if so, list)? 310, 307  5) Have you gained 3 pounds in a day or 5 pounds in a week? yes  6) Have you traveled recently? No    Patient wanted to schedule appt to be seen as well. She is scheduled for 10/28.

## 2018-07-02 NOTE — Telephone Encounter (Signed)
Spoke with pt, she is not SOB but is coughing a lot. She will take an extra furosemide 40 mg twice daily for the next 3 days. She has the follow up in 2 weeks but she voiced understanding to call me if her symptoms do not improve.

## 2018-07-04 DIAGNOSIS — E1142 Type 2 diabetes mellitus with diabetic polyneuropathy: Secondary | ICD-10-CM | POA: Insufficient documentation

## 2018-07-16 ENCOUNTER — Ambulatory Visit: Payer: Self-pay | Admitting: Cardiology

## 2018-07-16 DIAGNOSIS — M7741 Metatarsalgia, right foot: Secondary | ICD-10-CM | POA: Insufficient documentation

## 2018-07-16 DIAGNOSIS — M7742 Metatarsalgia, left foot: Secondary | ICD-10-CM

## 2018-07-16 DIAGNOSIS — M2040 Other hammer toe(s) (acquired), unspecified foot: Secondary | ICD-10-CM | POA: Insufficient documentation

## 2018-07-17 ENCOUNTER — Encounter: Payer: Self-pay | Admitting: Cardiology

## 2018-07-17 ENCOUNTER — Ambulatory Visit: Payer: BC Managed Care – PPO | Admitting: Cardiology

## 2018-07-17 ENCOUNTER — Other Ambulatory Visit: Payer: Self-pay

## 2018-07-17 DIAGNOSIS — R05 Cough: Secondary | ICD-10-CM

## 2018-07-17 DIAGNOSIS — E1142 Type 2 diabetes mellitus with diabetic polyneuropathy: Secondary | ICD-10-CM

## 2018-07-17 DIAGNOSIS — R9431 Abnormal electrocardiogram [ECG] [EKG]: Secondary | ICD-10-CM

## 2018-07-17 DIAGNOSIS — R0602 Shortness of breath: Secondary | ICD-10-CM

## 2018-07-17 DIAGNOSIS — Z951 Presence of aortocoronary bypass graft: Secondary | ICD-10-CM | POA: Diagnosis not present

## 2018-07-17 DIAGNOSIS — N289 Disorder of kidney and ureter, unspecified: Secondary | ICD-10-CM | POA: Diagnosis not present

## 2018-07-17 DIAGNOSIS — R059 Cough, unspecified: Secondary | ICD-10-CM

## 2018-07-17 LAB — BASIC METABOLIC PANEL
BUN/Creatinine Ratio: 16 (ref 12–28)
BUN: 16 mg/dL (ref 8–27)
CO2: 25 mmol/L (ref 20–29)
Calcium: 9.6 mg/dL (ref 8.7–10.3)
Chloride: 100 mmol/L (ref 96–106)
Creatinine, Ser: 0.97 mg/dL (ref 0.57–1.00)
GFR calc Af Amer: 72 mL/min/{1.73_m2} (ref >59)
GFR calc non Af Amer: 62 mL/min/{1.73_m2} (ref >59)
Glucose: 148 mg/dL — ABNORMAL HIGH (ref 65–99)
Potassium: 4.5 mmol/L (ref 3.5–5.2)
Sodium: 142 mmol/L (ref 134–144)

## 2018-07-17 LAB — PRO B NATRIURETIC PEPTIDE: NT-Pro BNP: 399 pg/mL — ABNORMAL HIGH (ref 0–287)

## 2018-07-17 MED ORDER — ASPIRIN EC 81 MG PO TBEC: 81.0000 mg | DELAYED_RELEASE_TABLET | Freq: Every day | ORAL | 3 refills | Status: DC

## 2018-07-17 MED ORDER — METOPROLOL SUCCINATE ER 25 MG PO TB24: 25.0000 mg | ORAL_TABLET | Freq: Every day | ORAL | 8 refills | Status: DC

## 2018-07-17 MED ORDER — ATORVASTATIN CALCIUM 80 MG PO TABS: 80.0000 mg | ORAL_TABLET | Freq: Every day | ORAL | 8 refills | Status: DC

## 2018-07-17 NOTE — Assessment & Plan Note (Signed)
BMI 50- negative sleep study July 2019

## 2018-07-17 NOTE — Assessment & Plan Note (Signed)
Echocardiogram 2016: normal LVF, grade 2 DD

## 2018-07-17 NOTE — Progress Notes (Signed)
07/17/2018 Felicia Rivas   [birth year]  [id number]  Primary Physician [name], MD Primary Cardiologist: Dr Jens Som  HPI: Ms. Fredrick is a pleasant 62 year old female who is followed by Dr. Jens Som.  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  She has a history of coronary disease and had bypass grafting in 2012.  Her presenting symptoms at that time according to her were cough.  She had no chest pain.  A Myoview was done in March 2016 that did show some abnormalities but the patient was treated medically and is done well.  She has multiple other comorbidities.  She has insulin-dependent diabetes with neuropathy, morbid obesity with a BMI of 50, hypertension with grade 2 diastolic dysfunction by echocardiogram and normal LV function, dyslipidemia, and treated hypertension.  She was hospitalized in July in [place] with pneumonia.  After that hospitalization she had a sleep study in [place] which she says was negative for sleep apnea.  She has had intermittent coughing since then.  In September she saw her PCP and was given a Depo-Medrol injection and antibiotics and she said that helped, she now has recurrence of the cough.  She denies any orthopnea.  She did have lower extremity edema a week or so ago and increased her Lasix with improvement.  Her EKG in the office today shows a new left bundle branch block.   Current Outpatient Medications  Medication Sig Dispense Refill  . albuterol (PROVENTIL HFA;VENTOLIN HFA) 108 (90 BASE) MCG/ACT inhaler Inhale 2 puffs into the lungs every 6 (six) hours as needed for wheezing or shortness of breath. 1 Inhaler 1  . clopidogrel (PLAVIX) 75 MG tablet Take 1 tablet (75 mg total) by mouth daily. 90 tablet 3  . furosemide (LASIX) 40 MG tablet Take 3 tablets (120 mg total) by mouth 2 (two) times daily. 540 tablet 3  . gabapentin (NEURONTIN) 300 MG capsule Take 600 mg by mouth 3 (three) times daily.     [name] glucose blood  (FREESTYLE LITE) test strip Check bg 3 times/day    . glucose blood (PRECISION QID TEST) test strip Check bg 3 times/day    . glucose blood test strip Check bg 3 times/day    . insulin regular human CONCENTRATED (HUMULIN R) 500 UNIT/ML injection INJECT 20 UNITS AT 8AM AND 12 UNITS AT 10PM WITH ADDITIONAL AS NEEDED.  MDD: 65 UNITS/DAY.    [name] Liraglutide 18 MG/3ML SOPN Inject 1.8 mg into the skin daily.    . metoprolol succinate (TOPROL-XL) 25 MG 24 hr tablet Take 1 tablet (25 mg total) by mouth daily. 30 tablet 8  . potassium chloride SA (K-DUR,KLOR-CON) 20 MEQ tablet Take 2 tablets (40 mEq total) by mouth daily. 180 tablet 3  . aspirin EC 81 MG tablet Take 1 tablet (81 mg total) by mouth daily. 90 tablet 3  . atorvastatin (LIPITOR) 80 MG tablet Take 1 tablet (80 mg total) by mouth daily. 30 tablet 8   No current facility-administered medications for this visit.     Allergies  Allergen Reactions  . Lisinopril Cough, Nausea And Vomiting and Other (See Comments)    ACE Inhibitor Cough ?Cough - changed to ARB 02/2013. ACE Inhibitor Cough ACE Inhibitor Cough ?Cough - changed to ARB 02/2013. ACE Inhibitor Cough ?Cough - changed to ARB 02/2013.  [name] Other Hives    Unknown IV antibiotic    Past Medical History:  Diagnosis Date  . Asthma   . Asthma   .  CAD (coronary artery disease)    a. NSTEMI 12/12 -> s/p CABGx3.   . Carotid artery disease (HCC)    a. Carotid US (08/2013): R 60-79%; L 40-59%; f/u 6 mos.  . Carotid stenosis    a. Carotid US (08/2013):  R 60-79%; L 40-59%; f/u 6 mos  . Chronic diastolic CHF (congestive heart failure) (HCC)    a. Echo 08/2011: mod LVH, nl EF. b. Echo 01/2013: EF 55-60%, mod-sev LVH, mod-sev LA dilitation  . Chronic diastolic heart failure (HCC)    a. Echo 08/2011: mod LVH, nl EF. b. Echo 01/2013: EF 55-60%, mod-sev LVH, mod-sev LA dilitation.  . Complication of anesthesia    '' Myself & my family wake up during surgery "  . Complication of anesthesia   .  Coronary artery disease    a. NSTEMI 12/12 -> s/p CABGx3.  [name] Cough   . Cough    a. 2014: Suspect a combination of CHF, GERD and ACE inhibitor-induced cough.  . Diabetes mellitus   . Diabetes mellitus (HCC)   . History of chicken pox   . HLD (hyperlipidemia)   . HTN (hypertension)   . Hyperlipidemia   . Hypertension   . Morbid obesity (HCC)   . Myocardial infarction (HCC)   . PONV (postoperative nausea and vomiting)   . S/P CABG (coronary artery bypass graft)    a. 2012 - Dr. Laneta Simmers;  LIMA-LAD, SVG-OM 2, SVG-PDA.  . S/P CABG x 3 09/07/11   Dr. Laneta Simmers;   LIMA-LAD, SVG-OM 2, SVG-PDA.  . Sleep disturbance    a. Had sleep study several years ago, wasn't told she has OSA but told she needs O2 at night.  . Sleep disturbance    Had sleep study several years ago, wasn't told she has OSA but told she needs O2 at night  . Stroke (HCC)   . Stroke [facility]) 2006   denies residual  . TIA (transient ischemic attack)     Social History   Socioeconomic History  . Marital status: Married    Spouse name: Not on file  . Number of children: 3  . Years of education: 54  . Highest education level: Not on file  Occupational History  . Occupation: Magazine features editor: [employer]	[place]  Social Needs  . Financial resource strain: Not on file  . Food insecurity:    Worry: Not on file    Inability: Not on file  . Transportation needs:    Medical: Not on file    Non-medical: Not on file  Tobacco Use  . Smoking status: Former Smoker    Packs/day: 0.50    Years: 25.00    Pack years: 12.50    Types: Cigarettes    Last attempt to quit: 11/11/2011    Years since quitting: 6.6  . Smokeless tobacco: Former Engineer, water and Sexual Activity  . Alcohol use: No  . Drug use: No  . Sexual activity: Never  Lifestyle  . Physical activity:    Days per week: Not on file    Minutes per session: Not on file  . Stress: Not on file  Relationships  . Social connections:    Talks on phone: Not on  file    Gets together: Not on file    Attends religious service: Not on file    Active member of club or organization: Not on file    Attends meetings of clubs or organizations: Not on file  Relationship status: Not on file  . Intimate partner violence:    Fear of current or ex partner: Not on file    Emotionally abused: Not on file    Physically abused: Not on file    Forced sexual activity: Not on file  Other Topics Concern  . Not on file  Social History Narrative   ** Merged History Encounter **       Regular exercise-yes     Family History  Problem Relation Age of Onset  . Cancer Mother   . Heart failure Mother        Also has hx MVP s/p MVR  . Diabetes Mother   . Coronary artery disease Father        Died of MI at age 32  . Heart disease Other        Parent     Review of Systems: General: negative for chills, fever, night sweats or weight changes.  Cardiovascular: negative for chest pain, orthopnea, palpitations, paroxysmal nocturnal dyspnea  Dermatological: negative for rash Respiratory: negative for wheezing Urologic: negative for hematuria Abdominal: negative for nausea, vomiting, diarrhea, bright red blood per rectum, melena, or hematemesis Neurologic: negative for visual changes, syncope, or dizziness All other systems reviewed and are otherwise negative except as noted above.    Blood pressure 112/68, pulse 78, height 5\' 6"  (1.676 m), weight (!) 308 lb (139.7 kg), SpO2 98 %.  General appearance: alert, cooperative, no distress and morbidly obese Neck: no carotid bruit and no JVD Lungs: clear to auscultation bilaterally Heart: regular rate and rhythm Extremities: no edema Skin: cool and dry Neurologic: Grossly normal  EKG NSR, LBBB (new)  ASSESSMENT AND PLAN:   Cough Cough possibly due to GERD in the past. Chronic problem but it was also her presenting symptom when she had her CABG (no history of chest pain).    Chronic diastolic heart failure  (HCC) Echocardiogram 2016: normal LVF, grade 2 DD  Hx of CABG 2012 CABG x 3 2012.  Dr. Laneta Simmers;   LIMA-LAD, SVG-OM 2, SVG-PDA. Myoview March 2016- reversible defects in the anterior and inferior basal wall. Treated medically.  Morbid obesity (HCC) BMI 50- negative sleep study July 2019  Diabetic polyneuropathy associated with type 2 diabetes mellitus (HCC) On Neurontin  Abnormal EKG New LBBB   PLAN I am concerned about her new left bundle branch block.  When she presented for her bypass her only symptoms were cough.  I would like to proceed with a Lexiscan Myoview.  I also ordered labs today including a BM P and BN P.  There was some confusion about her medications as well.  She stopped taking her Lipitor, she cannot remember when.  I suggested we resume that.  Toprol 25 mg had been dropped off her medication list on the last few office visits but she tells me now that she is taking it and needs a refill.  I also cut her aspirin back to 81 mg since she is on Plavix.  Felicia Shelter PA-C 07/17/2018 11:02 AM

## 2018-07-17 NOTE — Assessment & Plan Note (Signed)
Cough possibly due to GERD in the past. Chronic problem but it was also her presenting symptom when she had her CABG (no history of chest pain).

## 2018-07-17 NOTE — Assessment & Plan Note (Addendum)
CABG x 3 2012.  Dr. Laneta Simmers;   LIMA-LAD, SVG-OM 2, SVG-PDA. Myoview March 2016- reversible defects in the anterior and inferior basal wall. Treated medically.

## 2018-07-17 NOTE — Assessment & Plan Note (Signed)
New LBBB

## 2018-07-17 NOTE — Assessment & Plan Note (Signed)
On Neurontin ?

## 2018-07-17 NOTE — Patient Instructions (Addendum)
Medication Instructions:  STOP 325 Aspirin  START 81mg  of Aspirin Take 1 tablet once a day-can be bought over the counter Refills sent to the pharmacy If you need a refill on your cardiac medications before your next appointment, please call your pharmacy.   Lab work: Your physician recommends that you return for lab work in: TODAY-BMET, BNP If you have labs (blood work) drawn today and your tests are completely normal, you will receive your results only by: [name] MyChart Message (if you have MyChart) OR . A paper copy in the mail If you have any lab test that is abnormal or we need to change your treatment, we will call you to review the results.  Testing/Procedures: Your physician has requested that you have a lexiscan myoview. For further information please visit [email or web address] Please follow instruction sheet, as given.   Follow-Up: At [facility], you and your health needs are our priority.  As part of our continuing mission to provide you with exceptional heart care, we have created designated Provider Care Teams.  These Care Teams include your primary Cardiologist (physician) and Advanced Practice Providers (APPs -  Physician Assistants and Nurse Practitioners) who all work together to provide you with the care you need, when you need it. . Your physician recommends that you schedule a follow-up appointment in: 3 MONTHS WITH DR Jens Som  Any Other Special Instructions Will Be Listed Below (If Applicable).

## 2018-07-26 ENCOUNTER — Telehealth (HOSPITAL_COMMUNITY): Payer: Self-pay

## 2018-07-26 NOTE — Telephone Encounter (Signed)
Encounter complete. 

## 2018-07-27 ENCOUNTER — Telehealth (HOSPITAL_COMMUNITY): Payer: Self-pay

## 2018-07-27 NOTE — Telephone Encounter (Signed)
Encounter complete. 

## 2018-07-31 ENCOUNTER — Ambulatory Visit (HOSPITAL_COMMUNITY)
Admission: RE | Admit: 2018-07-31 | Payer: BC Managed Care – PPO | Source: Ambulatory Visit | Attending: Cardiology | Admitting: Cardiology

## 2018-08-01 ENCOUNTER — Ambulatory Visit (HOSPITAL_COMMUNITY): Payer: BC Managed Care – PPO

## 2018-08-10 ENCOUNTER — Other Ambulatory Visit: Payer: Self-pay | Admitting: Cardiology

## 2018-08-10 DIAGNOSIS — I25810 Atherosclerosis of coronary artery bypass graft(s) without angina pectoris: Secondary | ICD-10-CM

## 2018-08-10 NOTE — Telephone Encounter (Signed)
°*  STAT* If patient is at the pharmacy, call can be transferred to refill team.   1. Which medications need to be refilled? (please list name of each medication and dose if known) new prescription for Furosemide 40mg  for 4 times a day  2. Which pharmacy/location (including street and city if local pharmacy) is medication to be sent to?[facility] [phone]  3. Do they need a 30 day or 90 day supply? #360 and refills

## 2018-08-14 ENCOUNTER — Telehealth (HOSPITAL_COMMUNITY): Payer: Self-pay

## 2018-08-14 NOTE — Telephone Encounter (Signed)
Encounter complete. 

## 2018-08-21 ENCOUNTER — Encounter (INDEPENDENT_AMBULATORY_CARE_PROVIDER_SITE_OTHER): Payer: Self-pay

## 2018-08-21 ENCOUNTER — Ambulatory Visit (HOSPITAL_COMMUNITY)
Admission: RE | Admit: 2018-08-21 | Discharge: 2018-08-21 | Disposition: A | Discharge status: Home / Self Care | Payer: BC Managed Care – PPO | Source: Ambulatory Visit | Attending: Cardiovascular Disease | Admitting: Cardiovascular Disease

## 2018-08-21 DIAGNOSIS — Z951 Presence of aortocoronary bypass graft: Secondary | ICD-10-CM | POA: Diagnosis present

## 2018-08-21 DIAGNOSIS — R05 Cough: Secondary | ICD-10-CM | POA: Insufficient documentation

## 2018-08-21 DIAGNOSIS — E1142 Type 2 diabetes mellitus with diabetic polyneuropathy: Secondary | ICD-10-CM | POA: Insufficient documentation

## 2018-08-21 DIAGNOSIS — N289 Disorder of kidney and ureter, unspecified: Secondary | ICD-10-CM | POA: Diagnosis present

## 2018-08-21 DIAGNOSIS — R0602 Shortness of breath: Secondary | ICD-10-CM | POA: Diagnosis present

## 2018-08-21 MED ORDER — REGADENOSON 0.4 MG/5ML IV SOLN
0.4000 mg | Freq: Once | INTRAVENOUS | Status: AC
  Administered 2018-08-21: 0.4 mg via INTRAVENOUS

## 2018-08-21 MED ORDER — TECHNETIUM TC 99M TETROFOSMIN IV KIT
30.5000 | PACK | Freq: Once | INTRAVENOUS | Status: AC | PRN
  Administered 2018-08-21: 30.5 via INTRAVENOUS
  Filled 2018-08-21: qty 31

## 2018-08-22 ENCOUNTER — Emergency Department (HOSPITAL_COMMUNITY)
Admission: EM | Admit: 2018-08-22 | Discharge: 2018-08-22 | Disposition: A | Discharge status: Home / Self Care | Payer: BC Managed Care – PPO | Attending: Emergency Medicine | Admitting: Emergency Medicine

## 2018-08-22 ENCOUNTER — Ambulatory Visit (HOSPITAL_COMMUNITY)
Admission: RE | Admit: 2018-08-22 | Discharge: 2018-08-22 | Disposition: A | Discharge status: Home / Self Care | Payer: BC Managed Care – PPO | Source: Ambulatory Visit | Attending: Cardiology | Admitting: Cardiology

## 2018-08-22 ENCOUNTER — Other Ambulatory Visit: Payer: Self-pay

## 2018-08-22 ENCOUNTER — Encounter (HOSPITAL_COMMUNITY): Payer: Self-pay

## 2018-08-22 DIAGNOSIS — E11649 Type 2 diabetes mellitus with hypoglycemia without coma: Secondary | ICD-10-CM | POA: Insufficient documentation

## 2018-08-22 DIAGNOSIS — Z7982 Long term (current) use of aspirin: Secondary | ICD-10-CM | POA: Insufficient documentation

## 2018-08-22 DIAGNOSIS — I5032 Chronic diastolic (congestive) heart failure: Secondary | ICD-10-CM | POA: Diagnosis not present

## 2018-08-22 DIAGNOSIS — Z79899 Other long term (current) drug therapy: Secondary | ICD-10-CM | POA: Insufficient documentation

## 2018-08-22 DIAGNOSIS — Z87891 Personal history of nicotine dependence: Secondary | ICD-10-CM | POA: Insufficient documentation

## 2018-08-22 DIAGNOSIS — I110 Hypertensive heart disease with heart failure: Secondary | ICD-10-CM | POA: Insufficient documentation

## 2018-08-22 DIAGNOSIS — E162 Hypoglycemia, unspecified: Secondary | ICD-10-CM

## 2018-08-22 LAB — CBC WITH DIFFERENTIAL/PLATELET
Abs Immature Granulocytes: 0.03 10*3/uL (ref 0.00–0.07)
Basophils Absolute: 0.1 10*3/uL (ref 0.0–0.1)
Basophils Relative: 1 %
Eosinophils Absolute: 0.1 10*3/uL (ref 0.0–0.5)
Eosinophils Relative: 1 %
HCT: 39.5 % (ref 36.0–46.0)
HEMOGLOBIN: 12.4 g/dL (ref 12.0–15.0)
Immature Granulocytes: 0 %
Lymphocytes Relative: 14 %
Lymphs Abs: 1.5 10*3/uL (ref 0.7–4.0)
MCH: 29.9 pg (ref 26.0–34.0)
MCHC: 31.4 g/dL (ref 30.0–36.0)
MCV: 95.2 fL (ref 80.0–100.0)
Monocytes Absolute: 0.6 10*3/uL (ref 0.1–1.0)
Monocytes Relative: 6 %
Neutro Abs: 8.2 10*3/uL — ABNORMAL HIGH (ref 1.7–7.7)
Neutrophils Relative %: 78 %
PLATELETS: 226 10*3/uL (ref 150–400)
RBC: 4.15 MIL/uL (ref 3.87–5.11)
RDW: 14.9 % (ref 11.5–15.5)
WBC: 10.5 10*3/uL (ref 4.0–10.5)
nRBC: 0 % (ref 0.0–0.2)

## 2018-08-22 LAB — MYOCARDIAL PERFUSION IMAGING
LV dias vol: 205 mL (ref 46–106)
LV sys vol: 108 mL
Peak HR: 79 {beats}/min
Rest HR: 67 {beats}/min
SDS: 6
SRS: 3
SSS: 9
TID: 1.27

## 2018-08-22 LAB — CBG MONITORING, ED
GLUCOSE-CAPILLARY: 88 mg/dL (ref 70–99)
Glucose-Capillary: 71 mg/dL (ref 70–99)

## 2018-08-22 LAB — BASIC METABOLIC PANEL
Anion gap: 12 (ref 5–15)
BUN: 16 mg/dL (ref 8–23)
CO2: 24 mmol/L (ref 22–32)
Calcium: 9.2 mg/dL (ref 8.9–10.3)
Chloride: 105 mmol/L (ref 98–111)
Creatinine, Ser: 0.96 mg/dL (ref 0.44–1.00)
GFR calc Af Amer: >60 mL/min (ref >60)
GFR calc non Af Amer: >60 mL/min (ref >60)
Glucose, Bld: 59 mg/dL — ABNORMAL LOW (ref 70–99)
Potassium: 3.4 mmol/L — ABNORMAL LOW (ref 3.5–5.1)
Sodium: 141 mmol/L (ref 135–145)

## 2018-08-22 MED ORDER — TECHNETIUM TC 99M TETROFOSMIN IV KIT
28.4000 | PACK | Freq: Once | INTRAVENOUS | Status: AC | PRN
  Administered 2018-08-22: 28.4 via INTRAVENOUS

## 2018-08-22 NOTE — ED Provider Notes (Signed)
[facility] EMERGENCY DEPARTMENT Provider Note   CSN: [id number] Arrival date & time: 08/22/18  1037     History   Chief Complaint Chief Complaint  Patient presents with  . Hypoglycemia    HPI Felicia Rivas is a 62 y.o. female.  Patient is a 62 year old female with past medical history of diabetes, obesity, coronary artery disease, congestive heart failure, CABG, hypertension.  She presents today for evaluation of altered mental status.  She was having a nuclear medicine study performed today when she became groggy and less responsive.  Her blood sugar was checked and it was 19.  The patient reports taking her insulin this morning prior to this study, however did not have breakfast.  She just drank some Coca-Cola prior to her study.  She denies any chest pain or any difficulty breathing.  She was given oral sugar and her mental status has improved to baseline.  The history is provided by the patient.  Hypoglycemia  Initial blood sugar:  19 Blood sugar after intervention:  100 Severity:  Moderate Onset quality:  Sudden Timing:  Constant Progression:  Resolved Chronicity:  New Diabetic status:  Controlled with insulin Relieved by:  Oral glucose Associated symptoms: altered mental status     Past Medical History:  Diagnosis Date  . Asthma   . Asthma   . CAD (coronary artery disease)    a. NSTEMI 12/12 -> s/p CABGx3.   . Carotid artery disease (HCC)    a. Carotid US (08/2013): R 60-79%; L 40-59%; f/u 6 mos.  . Carotid stenosis    a. Carotid US (08/2013):  R 60-79%; L 40-59%; f/u 6 mos  . Chronic diastolic CHF (congestive heart failure) (HCC)    a. Echo 08/2011: mod LVH, nl EF. b. Echo 01/2013: EF 55-60%, mod-sev LVH, mod-sev LA dilitation  . Chronic diastolic heart failure (HCC)    a. Echo 08/2011: mod LVH, nl EF. b. Echo 01/2013: EF 55-60%, mod-sev LVH, mod-sev LA dilitation.  . Complication of anesthesia    '' Myself & my family wake up during surgery  "  . Complication of anesthesia   . Coronary artery disease    a. NSTEMI 12/12 -> s/p CABGx3.  [name] Cough   . Cough    a. 2014: Suspect a combination of CHF, GERD and ACE inhibitor-induced cough.  . Diabetes mellitus   . Diabetes mellitus (HCC)   . History of chicken pox   . HLD (hyperlipidemia)   . HTN (hypertension)   . Hyperlipidemia   . Hypertension   . Morbid obesity (HCC)   . Myocardial infarction (HCC)   . PONV (postoperative nausea and vomiting)   . S/P CABG (coronary artery bypass graft)    a. 2012 - Dr. Laneta Simmers;  LIMA-LAD, SVG-OM 2, SVG-PDA.  . S/P CABG x 3 09/07/11   Dr. Laneta Simmers;   LIMA-LAD, SVG-OM 2, SVG-PDA.  . Sleep disturbance    a. Had sleep study several years ago, wasn't told she has OSA but told she needs O2 at night.  . Sleep disturbance    Had sleep study several years ago, wasn't told she has OSA but told she needs O2 at night  . Stroke (HCC)   . Stroke [facility]) 2006   denies residual  . TIA (transient ischemic attack)     Patient Active Problem List   Diagnosis Date Noted  . Abnormal EKG 07/17/2018  . Metatarsalgia of both feet 07/16/2018  . Other acquired hammer toe 07/16/2018  .  Diabetic peripheral neuropathy (HCC) 07/04/2018  . Diabetic polyneuropathy associated with type 2 diabetes mellitus (HCC) 04/27/2018  . Psychophysiological insomnia 03/19/2018  . 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 Degeneration of lumbar intervertebral disc 02/08/2018  . Weakness of left leg 01/19/2018  . Vitamin D deficiency 09/22/2016  . Past history of myocardial infarction 04/18/2016  . Diabetic retinopathy associated with type 2 diabetes mellitus (HCC) 11/19/2015  . Long-term insulin use (HCC) 11/19/2015  . Acute on chronic diastolic congestive heart failure (HCC) 11/14/2014  . ACE-inhibitor cough 11/10/2014  . History of asthma 11/10/2014  . Carotid artery disease (HCC) 11/10/2014  . Cough 11/10/2014    . LVH (left ventricular hypertrophy) 11/10/2014  . Shortness of breath 11/10/2014  . Hypotension 11/10/2014  . Renal insufficiency 11/10/2014  . Lightheadedness 11/10/2014  . Bronchitis, acute 11/10/2014  . Carotid stenosis   . Pain in joint, ankle and foot 04/05/2013  . Mild intermittent asthma without complication 02/23/2013  . Preventative health care 11/26/2011  . Hypertension 11/25/2011  . Incisional infection 11/03/2011  . Retinopathy 10/18/2011  . Hx of CABG 2012   . Chronic diastolic heart failure (HCC)   . HLD (hyperlipidemia)   . Morbid obesity (HCC) 09/01/2011  . Asthma 08/31/2011  . DM2 (diabetes mellitus, type 2) (HCC) 08/31/2011    Past Surgical History:  Procedure Laterality Date  . CAROTID ANGIOGRAPHY N/A 03/17/2017   Procedure: Carotid Angiography;  Surgeon: Sherren Kerns, MD;  Location: [facility] INVASIVE CV LAB;  Service: Cardiovascular;  Laterality: N/A;  . CAROTID PTA/STENT INTERVENTION Right 03/29/2017   Procedure: Carotid PTA/Stent Intervention;  Surgeon: Sherren Kerns, MD;  Location: [facility] INVASIVE CV LAB;  Service: Cardiovascular;  Laterality: Right;  . CHOLECYSTECTOMY    . CHOLECYSTECTOMY  1990's  . CORONARY ARTERY BYPASS GRAFT    . CORONARY ARTERY BYPASS GRAFT  09/07/2011   CABG X3; Procedure: CORONARY ARTERY BYPASS GRAFTING (CABG);  Surgeon: Alleen Borne, MD;  [facility];  Service: Open Heart Surgery;  Laterality: N/A;  . LEFT HEART CATHETERIZATION WITH CORONARY ANGIOGRAM N/A 09/02/2011   Procedure: LEFT HEART CATHETERIZATION WITH CORONARY ANGIOGRAM;  Surgeon: Herby Abraham, MD;  Location: [facility] CATH LAB;  Service: Cardiovascular;  Laterality: N/A;  . TUBAL LIGATION  1981     OB History    Gravida  0   Para  0   Term  0   Preterm  0   AB  0   Living        SAB  0   TAB  0   Ectopic  0   Multiple      Live Births               Home Medications    Prior to Admission medications   Medication Sig Start Date End Date  Taking? Authorizing Provider  albuterol (PROVENTIL HFA;VENTOLIN HFA) 108 (90 BASE) MCG/ACT inhaler Inhale 2 puffs into the lungs every 6 (six) hours as needed for wheezing or shortness of breath. 11/11/14   Laurann Montana, PA-C  aspirin EC 81 MG tablet Take 1 tablet (81 mg total) by mouth daily. 07/17/18   Abelino Derrick, PA-C  atorvastatin (LIPITOR) 80 MG tablet Take 1 tablet (80 mg total) by mouth daily. 07/17/18   Abelino Derrick, PA-C  clopidogrel (PLAVIX) 75 MG tablet Take 1 tablet (75 mg total) by mouth daily. 12/12/17   Nickel, Carma Lair, NP  furosemide (  LASIX) 40 MG tablet Take 3 tablets (120 mg total) by mouth 2 (two) times daily. 08/10/18   Lewayne Bunting, MD  gabapentin (NEURONTIN) 300 MG capsule Take 600 mg by mouth 3 (three) times daily.     [provider]  glucose blood (FREESTYLE LITE) test strip Check bg 3 times/day 11/19/15   [provider]  glucose blood (PRECISION QID TEST) test strip Check bg 3 times/day 11/19/15   [provider]  glucose blood test strip Check bg 3 times/day 11/19/15   [provider]  insulin regular human CONCENTRATED (HUMULIN R) 500 UNIT/ML injection INJECT 20 UNITS AT 8AM AND 12 UNITS AT 10PM WITH ADDITIONAL AS NEEDED.  MDD: 65 UNITS/DAY. 02/20/17   [provider]  Liraglutide 18 MG/3ML SOPN Inject 1.8 mg into the skin daily.    [provider]  metoprolol succinate (TOPROL-XL) 25 MG 24 hr tablet Take 1 tablet (25 mg total) by mouth daily. 07/17/18   Lewayne Bunting, MD  potassium chloride SA (K-DUR,KLOR-CON) 20 MEQ tablet Take 2 tablets (40 mEq total) by mouth daily. 07/12/17   Lewayne Bunting, MD    Family History Family History  Problem Relation Age of Onset  . Cancer Mother   . Heart failure Mother        Also has hx MVP s/p MVR  . Diabetes Mother   . Coronary artery disease Father        Died of MI at age 28  . Heart disease Other        Parent    Social History Social History   Tobacco  Use  . Smoking status: Former Smoker    Packs/day: 0.50    Years: 25.00    Pack years: 12.50    Types: Cigarettes    Last attempt to quit: 11/11/2011    Years since quitting: 6.7  . Smokeless tobacco: Former Engineer, water Use Topics  . Alcohol use: No  . Drug use: No     Allergies   Lisinopril and Other   Review of Systems Review of Systems  All other systems reviewed and are negative.    Physical Exam Updated Vital Signs BP (!) 102/42 (BP Location: Right Arm)   Pulse (!) 57   Temp (!) 96.9 F (36.1 C) (Axillary)   Resp 16   SpO2 99%   Physical Exam  Constitutional: She is oriented to person, place, and time. She appears well-developed and well-nourished. No distress.  HENT:  Head: Normocephalic and atraumatic.  Mouth/Throat: Oropharynx is clear and moist.  Eyes: Pupils are equal, round, and reactive to light. EOM are normal.  Neck: Normal range of motion. Neck supple.  Cardiovascular: Normal rate and regular rhythm. Exam reveals no gallop and no friction rub.  No murmur heard. Pulmonary/Chest: Effort normal and breath sounds normal. No respiratory distress. She has no wheezes.  Abdominal: Soft. Bowel sounds are normal. She exhibits no distension. There is no tenderness.  Musculoskeletal: Normal range of motion.  Neurological: She is alert and oriented to person, place, and time. No cranial nerve deficit. She exhibits normal muscle tone. Coordination normal.  Skin: Skin is warm and dry. She is not diaphoretic.  Nursing note and vitals reviewed.    ED Treatments / Results  Labs (all labs ordered are listed, but only abnormal results are displayed) Labs Reviewed  BASIC METABOLIC PANEL  CBC WITH DIFFERENTIAL/PLATELET  CBG MONITORING, ED    EKG None  Radiology No results found.  Procedures Procedures (including critical care time)  Medications Ordered in ED Medications - No data to display   Initial Impression / Assessment and Plan / ED Course    I have reviewed the triage vital signs and the nursing notes.  Pertinent labs & imaging results that were available during my care of the patient were reviewed by me and considered in my medical decision making (see chart for details).  Patient presents with complaints of altered mental status that began while having a nuclear study.  She was found to have a blood sugar of 19.  She did not eat breakfast this morning after taking her insulin, just a few sips of cola.  She was given oral glucose along with food here in the ER.  Her mental status has returned to baseline and her sugars are maintaining the normal range..  At this point, patient is adamant about going home and I feel as though this is appropriate.  She will be discharged with continued monitoring of her blood sugars and return as needed.  Final Clinical Impressions(s) / ED Diagnoses   Final diagnoses:  None    ED Discharge Orders    None       Geoffery Lyonselo, Neomia Herbel, MD 08/22/18 1328

## 2018-08-22 NOTE — Op Note (Signed)
0930 am Pt [facility]. Pt during ambulation began leaning and was assisted to chair. Pt stated she didn't feel too well and blood sugar was low this am and only 34 mg/dl when she checked it at home. Pt given cup of orange juice and peanut butter crackers. Pt blood sugar was checked and glucometer reading was only 19 mg/dl; Pt was alert to self but not situation; Pt was drinking second cup of orange juice and was given oral glucose and 2 gluco-tabs. EMS was called and IV established on Patient Right hand/wrist; Pt daughter was notified by phone of patient condition and intent to transport via EMS; Dr. Herbie BaltimoreHarding at patient side for monitoring purposes; Pt glucose 25 mg/dl at this time. Pt had 1 to 2 second episode of snoring respirations. Pt began asking questions, still alert to self and surroundings but confused of situation; Pt given more oral glucose and chocolates. Pt given sprite to drink. Glucose now 59 mg/dl; Pt confirmed to staff and Dr. Herbie BaltimoreHarding that her blood sugar was low this morning and she only ate crackers with her medications and she took 20 units of insulin. Pt had period of diaphoresis and brief nausea for about 2 minutes and it subsided. Pt finished tube of oral glucose and sipping on sprite while awaiting EMS. Pt alert and oriented to self and situation. Dr. Herbie BaltimoreHarding exits room. I remain with patient as well as other departmental staff. Pt assisted to camera table to complete nuclear pictures while awaiting arrival of EMS. Pt on camera table for 3.5 minutes when EMS arrived. EMS arrived and given report. Pt remained alert and oriented to self and situation. Pt talking and aware. Pt assisted by EMS to stretcher for transport; Pt given cane, glasses, sweater, phone and keys. Pt phone case broken upon transport, pt stated she had dropped phone in waiting area multiple times. Pt stated no other personal belongings with her. Pt stated pocketbook is in the car. 1013 Pt left with  EMS. Pt stable at time of transport.

## 2018-08-22 NOTE — ED Notes (Addendum)
Pt given cup of coke to drink. CBGG 88

## 2018-08-22 NOTE — ED Triage Notes (Signed)
[facility] [address], while having a nuclear med study performed the pt became lethargic and had 1 minute episode of snoring respirations, CBG was 19, given orange juice and crackers, 2 tubes of oral glucose and 1 glucose tablet, Recheck CBG by EMS 103. Axox4. VSS. 116/56, Pulse 60, 96% on RA.

## 2018-08-22 NOTE — ED Notes (Signed)
Pt offered warm blankets and denied

## 2018-08-22 NOTE — Discharge Instructions (Addendum)
Continue medications as previously prescribed.  Keep a record of your blood sugars to take to your primary doctor at your next appointment.  Return to the emergency department if you experience any new and/or concerning symptoms.

## 2018-09-07 ENCOUNTER — Other Ambulatory Visit: Payer: Self-pay | Admitting: Cardiology

## 2018-09-07 MED ORDER — POTASSIUM CHLORIDE CRYS ER 20 MEQ PO TBCR: 40.0000 meq | EXTENDED_RELEASE_TABLET | Freq: Every day | ORAL | 3 refills | Status: DC

## 2018-09-07 NOTE — Telephone Encounter (Signed)
°*  STAT* If patient is at the pharmacy, call can be transferred to refill team.   1. Which medications need to be refilled? (please list name of each medication and dose if known)  potassium chloride SA (K-DUR,KLOR-CON) 20 MEQ tablet  2. Which pharmacy/location (including street and city if local pharmacy) is medication to be sent to?  [facility] - [place], [place] - [address]    3. Do they need a 30 day or 90 day supply?  90 days

## 2018-10-12 NOTE — Progress Notes (Deleted)
HPI: FU CAD and diastolic CHF. She is status post CABG 08/2011. Echo 2/16 showed normal LV function and grade 2 diastolic dysfunction. Carotid Dopplers April 2018 showed 80-99% right and 1-39% left. Patient had carotid stent placed July 2018.   Also with peripheral vascular disease followed by vascular surgery.  Nuclear study December 2019 showed ejection fraction 47%, small fixed anteroseptal defect and reversible inferolateral defect suggestive of ischemia.  Treated medically.  Since she was last seen,   Current Outpatient Medications  Medication Sig Dispense Refill  . albuterol (PROVENTIL HFA;VENTOLIN HFA) 108 (90 BASE) MCG/ACT inhaler Inhale 2 puffs into the lungs every 6 (six) hours as needed for wheezing or shortness of breath. 1 Inhaler 1  . aspirin EC 81 MG tablet Take 1 tablet (81 mg total) by mouth daily. 90 tablet 3  . atorvastatin (LIPITOR) 80 MG tablet Take 1 tablet (80 mg total) by mouth daily. 30 tablet 8  . clopidogrel (PLAVIX) 75 MG tablet Take 1 tablet (75 mg total) by mouth daily. 90 tablet 3  . furosemide (LASIX) 40 MG tablet Take 3 tablets (120 mg total) by mouth 2 (two) times daily. 540 tablet 1  . gabapentin (NEURONTIN) 300 MG capsule Take 600 mg by mouth 3 (three) times daily.     [name]. glucose blood (FREESTYLE LITE) test strip Check bg 3 times/day    . glucose blood (PRECISION QID TEST) test strip Check bg 3 times/day    . glucose blood test strip Check bg 3 times/day    . insulin regular human CONCENTRATED (HUMULIN R) 500 UNIT/ML injection INJECT 20 UNITS AT 8AM AND 12 UNITS AT 10PM WITH ADDITIONAL AS NEEDED.  MDD: 65 UNITS/DAY.    [name]. Liraglutide 18 MG/3ML SOPN Inject 1.8 mg into the skin daily.    . metoprolol succinate (TOPROL-XL) 25 MG 24 hr tablet Take 1 tablet (25 mg total) by mouth daily. 30 tablet 8  . potassium chloride SA (K-DUR,KLOR-CON) 20 MEQ tablet Take 2 tablets (40 mEq total) by mouth daily. 180 tablet 3   No current facility-administered medications  for this visit.      Past Medical History:  Diagnosis Date  . Asthma   . Asthma   . CAD (coronary artery disease)    a. NSTEMI 12/12 -> s/p CABGx3.   . Carotid artery disease (HCC)    a. Carotid US (08/2013): R 60-79%; L 40-59%; f/u 6 mos.  . Carotid stenosis    a. Carotid US (08/2013):  R 60-79%; L 40-59%; f/u 6 mos  . Chronic diastolic CHF (congestive heart failure) (HCC)    a. Echo 08/2011: mod LVH, nl EF. b. Echo 01/2013: EF 55-60%, mod-sev LVH, mod-sev LA dilitation  . Chronic diastolic heart failure (HCC)    a. Echo 08/2011: mod LVH, nl EF. b. Echo 01/2013: EF 55-60%, mod-sev LVH, mod-sev LA dilitation.  . Complication of anesthesia    '' Myself & my family wake up during surgery "  . Complication of anesthesia   . Coronary artery disease    a. NSTEMI 12/12 -> s/p CABGx3.  [name]. Cough   . Cough    a. 2014: Suspect a combination of CHF, GERD and ACE inhibitor-induced cough.  . Diabetes mellitus   . Diabetes mellitus (HCC)   . History of chicken pox   . HLD (hyperlipidemia)   . HTN (hypertension)   . Hyperlipidemia   . Hypertension   . Morbid obesity (HCC)   . Myocardial infarction (  HCC)   . PONV (postoperative nausea and vomiting)   . S/P CABG (coronary artery bypass graft)    a. 2012 - Dr. Laneta SimmersBartle;  LIMA-LAD, SVG-OM 2, SVG-PDA.  . S/P CABG x 3 09/07/11   Dr. Laneta SimmersBartle;   LIMA-LAD, SVG-OM 2, SVG-PDA.  . Sleep disturbance    a. Had sleep study several years ago, wasn't told she has OSA but told she needs O2 at night.  . Sleep disturbance    Had sleep study several years ago, wasn't told she has OSA but told she needs O2 at night  . Stroke (HCC)   . Stroke [facility](HCC) 2006   denies residual  . TIA (transient ischemic attack)     Past Surgical History:  Procedure Laterality Date  . CAROTID ANGIOGRAPHY N/A 03/17/2017   Procedure: Carotid Angiography;  Surgeon: Sherren KernsFields, Charles E, MD;  [facility];  Service: Cardiovascular;  Laterality: N/A;  . CAROTID PTA/STENT  INTERVENTION Right 03/29/2017   Procedure: Carotid PTA/Stent Intervention;  Surgeon: Sherren KernsFields, Charles E, MD;  Location: [facility] INVASIVE CV LAB;  Service: Cardiovascular;  Laterality: Right;  . CHOLECYSTECTOMY    . CHOLECYSTECTOMY  1990's  . CORONARY ARTERY BYPASS GRAFT    . CORONARY ARTERY BYPASS GRAFT  09/07/2011   CABG X3; Procedure: CORONARY ARTERY BYPASS GRAFTING (CABG);  Surgeon: Alleen BorneBryan K Bartle, MD;  [facility];  Service: Open Heart Surgery;  Laterality: N/A;  . LEFT HEART CATHETERIZATION WITH CORONARY ANGIOGRAM N/A 09/02/2011   Procedure: LEFT HEART CATHETERIZATION WITH CORONARY ANGIOGRAM;  Surgeon: Herby Abrahamhomas D Stuckey, MD;  [facility];  Service: Cardiovascular;  Laterality: N/A;  . TUBAL LIGATION  1981    Social History   Socioeconomic History  . Marital status: Married    Spouse name: Not on file  . Number of children: 3  . Years of education: 2716  . Highest education level: Not on file  Occupational History  . Occupation: Magazine features editorTeacher    Employer: [employer]  Social Needs  . Financial resource strain: Not on file  . Food insecurity:    Worry: Not on file    Inability: Not on file  . Transportation needs:    Medical: Not on file    Non-medical: Not on file  Tobacco Use  . Smoking status: Former Smoker    Packs/day: 0.50    Years: 25.00    Pack years: 12.50    Types: Cigarettes    Last attempt to quit: 11/11/2011    Years since quitting: 6.9  . Smokeless tobacco: Former Engineer, waterUser  Substance and Sexual Activity  . Alcohol use: No  . Drug use: No  . Sexual activity: Never  Lifestyle  . Physical activity:    Days per week: Not on file    Minutes per session: Not on file  . Stress: Not on file  Relationships  . Social connections:    Talks on phone: Not on file    Gets together: Not on file    Attends religious service: Not on file    Active member of club or organization: Not on file    Attends meetings of clubs or organizations: Not on file     Relationship status: Not on file  . Intimate partner violence:    Fear of current or ex partner: Not on file    Emotionally abused: Not on file    Physically abused: Not on file    Forced sexual activity: Not on file  Other  Topics Concern  . Not on file  Social History Narrative   ** Merged History Encounter **       Regular exercise-yes    Family History  Problem Relation Age of Onset  . Cancer Mother   . Heart failure Mother        Also has hx MVP s/p MVR  . Diabetes Mother   . Coronary artery disease Father        Died of MI at age 28  . Heart disease Other        Parent    ROS: no fevers or chills, productive cough, hemoptysis, dysphasia, odynophagia, melena, hematochezia, dysuria, hematuria, rash, seizure activity, orthopnea, PND, pedal edema, claudication. Remaining systems are negative.  Physical Exam: Well-developed well-nourished in no acute distress.  Skin is warm and dry.  HEENT is normal.  Neck is supple.  Chest is clear to auscultation with normal expansion.  Cardiovascular exam is regular rate and rhythm.  Abdominal exam nontender or distended. No masses palpated. Extremities show no edema. neuro grossly intact  ECG- personally reviewed  A/P  1  Felicia Millers, MD

## 2018-10-14 DIAGNOSIS — S82841A Displaced bimalleolar fracture of right lower leg, initial encounter for closed fracture: Secondary | ICD-10-CM | POA: Insufficient documentation

## 2018-10-19 DIAGNOSIS — J449 Chronic obstructive pulmonary disease, unspecified: Secondary | ICD-10-CM | POA: Insufficient documentation

## 2018-10-20 DIAGNOSIS — K5904 Chronic idiopathic constipation: Secondary | ICD-10-CM | POA: Insufficient documentation

## 2018-10-23 ENCOUNTER — Ambulatory Visit: Payer: BC Managed Care – PPO | Admitting: Cardiology

## 2018-10-24 ENCOUNTER — Telehealth: Payer: Self-pay | Admitting: Cardiology

## 2018-10-24 NOTE — Telephone Encounter (Signed)
Spoke with pt dtr, patient was recently discharged from [facility] and during that hospital stay she was diagnosed with COPD and told to find a pulmonary doctor. She would like to [facility] and was asking for a referral. Advised her to check with her medical doctor as some insurances do not like [place] to refer. We recommended [facility] in [place]. She will call back if she needs my help.

## 2018-10-24 NOTE — Telephone Encounter (Signed)
New Message   Patient is calling because she needs a referral or recommendation for a pulmonologist in [place]. Please call to discuss.

## 2018-11-07 ENCOUNTER — Telehealth: Payer: Self-pay

## 2018-11-07 NOTE — Telephone Encounter (Signed)
Appt rescheduled. Nothing further is needed at this time.

## 2018-11-08 ENCOUNTER — Institutional Professional Consult (permissible substitution): Payer: Self-pay | Admitting: Pulmonary Disease

## 2018-11-22 ENCOUNTER — Encounter: Payer: Self-pay | Admitting: Pulmonary Disease

## 2018-11-22 ENCOUNTER — Ambulatory Visit: Payer: BC Managed Care – PPO | Admitting: Pulmonary Disease

## 2018-11-22 VITALS — BP 126/64 | HR 59 | Ht 63.0 in | Wt 314.0 lb

## 2018-11-22 DIAGNOSIS — R0602 Shortness of breath: Secondary | ICD-10-CM

## 2018-11-22 DIAGNOSIS — R05 Cough: Secondary | ICD-10-CM | POA: Diagnosis not present

## 2018-11-22 DIAGNOSIS — I5022 Chronic systolic (congestive) heart failure: Secondary | ICD-10-CM

## 2018-11-22 DIAGNOSIS — I5032 Chronic diastolic (congestive) heart failure: Secondary | ICD-10-CM | POA: Diagnosis not present

## 2018-11-22 DIAGNOSIS — J4520 Mild intermittent asthma, uncomplicated: Secondary | ICD-10-CM

## 2018-11-22 DIAGNOSIS — R058 Other specified cough: Secondary | ICD-10-CM

## 2018-11-22 DIAGNOSIS — R059 Cough, unspecified: Secondary | ICD-10-CM

## 2018-11-22 DIAGNOSIS — T464X5A Adverse effect of angiotensin-converting-enzyme inhibitors, initial encounter: Secondary | ICD-10-CM

## 2018-11-22 NOTE — Progress Notes (Signed)
Synopsis: Referred in March 2020 for cough by [name], [name], MD  Subjective:   PATIENT ID: Felicia BeckmannSharon Rivas GENDER: female DOB: 12/11/1955, MRN: [id number]  Chief Complaint  Patient presents with  . Consult    Recently seen in ED for COPD. Here to get established Daughter states she has CHF. She sleeps with pillows at night. States she has been SOB for years. Uses 2L of oxygen at night. Uses albuterol inhaler as needed. Currently on Bactrim for left leg incision.     Having trouble breathing for the past several years. She has episodic coughing spasms. She was recently hospitalized for a fall and broken leg. She was told that she need to follow up with pulmonary. PMH of MI, CHF, DMII, HTN, HLD, Carotid stent. Never been told she had OSA. Recently moved in with daughter. Doing bettwe since moving with family. Former smoker, 20 years, 1 ppd. She was a Runner, broadcasting/film/videoteacher for 40 years, special ed Runner, broadcasting/film/videoteacher, high school.   For the past several months patient has had progressive shortness of breath.  She does wheeze occasionally at night.  She has to sleep in a upright chair position.  This is been going on for some time.  She does not check her weights regularly for management of her heart failure.  She believes her dry weight is somewhere around 300 she is at 314 today in the office.  She does not routinely manage her dietary intake or watch her fluid intake.  Patient has been labeled with having asthma in the past.    Past Medical History:  Diagnosis Date  . Asthma   . Asthma   . CAD (coronary artery disease)    a. NSTEMI 12/12 -> s/p CABGx3.   . Carotid artery disease (HCC)    a. Carotid US (08/2013): R 60-79%; L 40-59%; f/u 6 mos.  . Carotid stenosis    a. Carotid US (08/2013):  R 60-79%; L 40-59%; f/u 6 mos  . Chronic diastolic CHF (congestive heart failure) (HCC)    a. Echo 08/2011: mod LVH, nl EF. b. Echo 01/2013: EF 55-60%, mod-sev LVH, mod-sev LA dilitation  . Chronic diastolic heart failure  (HCC)    a. Echo 08/2011: mod LVH, nl EF. b. Echo 01/2013: EF 55-60%, mod-sev LVH, mod-sev LA dilitation.  . Complication of anesthesia    '' Myself & my family wake up during surgery "  . Complication of anesthesia   . Coronary artery disease    a. NSTEMI 12/12 -> s/p CABGx3.  [name]. Cough   . Cough    a. 2014: Suspect a combination of CHF, GERD and ACE inhibitor-induced cough.  . Diabetes mellitus   . Diabetes mellitus (HCC)   . History of chicken pox   . HLD (hyperlipidemia)   . HTN (hypertension)   . Hyperlipidemia   . Hypertension   . Morbid obesity (HCC)   . Myocardial infarction (HCC)   . PONV (postoperative nausea and vomiting)   . S/P CABG (coronary artery bypass graft)    a. 2012 - Dr. Laneta SimmersBartle;  LIMA-LAD, SVG-OM 2, SVG-PDA.  . S/P CABG x 3 09/07/11   Dr. Laneta SimmersBartle;   LIMA-LAD, SVG-OM 2, SVG-PDA.  . Sleep disturbance    a. Had sleep study several years ago, wasn't told she has OSA but told she needs O2 at night.  . Sleep disturbance    Had sleep study several years ago, wasn't told she has OSA but told she needs O2 at night  .  Stroke (HCC)   . Stroke [facility]) 2006   denies residual  . TIA (transient ischemic attack)      Family History  Problem Relation Age of Onset  . Cancer Mother   . Heart failure Mother        Also has hx MVP s/p MVR  . Diabetes Mother   . Coronary artery disease Father        Died of MI at age 79  . Heart disease Other        Parent     Past Surgical History:  Procedure Laterality Date  . CAROTID ANGIOGRAPHY N/A 03/17/2017   Procedure: Carotid Angiography;  Surgeon: Sherren Kerns, MD;  Location: [facility] INVASIVE CV LAB;  Service: Cardiovascular;  Laterality: N/A;  . CAROTID PTA/STENT INTERVENTION Right 03/29/2017   Procedure: Carotid PTA/Stent Intervention;  Surgeon: Sherren Kerns, MD;  Location: [facility] INVASIVE CV LAB;  Service: Cardiovascular;  Laterality: Right;  . CHOLECYSTECTOMY    . CHOLECYSTECTOMY  1990's  . CORONARY ARTERY BYPASS GRAFT      . CORONARY ARTERY BYPASS GRAFT  09/07/2011   CABG X3; Procedure: CORONARY ARTERY BYPASS GRAFTING (CABG);  Surgeon: Alleen Borne, MD;  Location: [facility] OR;  Service: Open Heart Surgery;  Laterality: N/A;  . LEFT HEART CATHETERIZATION WITH CORONARY ANGIOGRAM N/A 09/02/2011   Procedure: LEFT HEART CATHETERIZATION WITH CORONARY ANGIOGRAM;  Surgeon: Herby Abraham, MD;  Location: [facility] CATH LAB;  Service: Cardiovascular;  Laterality: N/A;  . TUBAL LIGATION  1981    Social History   Socioeconomic History  . Marital status: Married    Spouse name: Not on file  . Number of children: 3  . Years of education: 31  . Highest education level: Not on file  Occupational History  . Occupation: Magazine features editor: [employer]	[place]  Social Needs  . Financial resource strain: Not on file  . Food insecurity:    Worry: Not on file    Inability: Not on file  . Transportation needs:    Medical: Not on file    Non-medical: Not on file  Tobacco Use  . Smoking status: Former Smoker    Packs/day: 0.50    Years: 25.00    Pack years: 12.50    Types: Cigarettes    Last attempt to quit: 11/11/2011    Years since quitting: 7.0  . Smokeless tobacco: Former Engineer, water and Sexual Activity  . Alcohol use: No  . Drug use: No  . Sexual activity: Never  Lifestyle  . Physical activity:    Days per week: Not on file    Minutes per session: Not on file  . Stress: Not on file  Relationships  . Social connections:    Talks on phone: Not on file    Gets together: Not on file    Attends religious service: Not on file    Active member of club or organization: Not on file    Attends meetings of clubs or organizations: Not on file    Relationship status: Not on file  . Intimate partner violence:    Fear of current or ex partner: Not on file    Emotionally abused: Not on file    Physically abused: Not on file    Forced sexual activity: Not on file  Other Topics Concern  . Not on file  Social  History Narrative   ** Merged History Encounter **       Regular exercise-yes  Allergies  Allergen Reactions  . Lisinopril Cough, Nausea And Vomiting and Other (See Comments)    ACE Inhibitor Cough ?Cough - changed to ARB 02/2013. ACE Inhibitor Cough ACE Inhibitor Cough ?Cough - changed to ARB 02/2013. ACE Inhibitor Cough ?Cough - changed to ARB 02/2013.  [name] Other Hives    Unknown IV antibiotic  . Tramadol     Causes respiratory distress     Outpatient Medications Prior to Visit  Medication Sig Dispense Refill  . albuterol (PROVENTIL HFA;VENTOLIN HFA) 108 (90 BASE) MCG/ACT inhaler Inhale 2 puffs into the lungs every 6 (six) hours as needed for wheezing or shortness of breath. 1 Inhaler 1  . aspirin EC 81 MG tablet Take 1 tablet (81 mg total) by mouth daily. 90 tablet 3  . atorvastatin (LIPITOR) 80 MG tablet Take 1 tablet (80 mg total) by mouth daily. 30 tablet 8  . clopidogrel (PLAVIX) 75 MG tablet Take 1 tablet (75 mg total) by mouth daily. 90 tablet 3  . furosemide (LASIX) 40 MG tablet Take 3 tablets (120 mg total) by mouth 2 (two) times daily. 540 tablet 1  . gabapentin (NEURONTIN) 300 MG capsule Take 600 mg by mouth 3 (three) times daily.     [name] glucose blood (FREESTYLE LITE) test strip Check bg 3 times/day    . glucose blood (PRECISION QID TEST) test strip Check bg 3 times/day    . glucose blood test strip Check bg 3 times/day    . insulin regular human CONCENTRATED (HUMULIN R) 500 UNIT/ML injection INJECT 20 UNITS AT 8AM AND 12 UNITS AT 10PM WITH ADDITIONAL AS NEEDED.  MDD: 65 UNITS/DAY.    [name] Liraglutide 18 MG/3ML SOPN Inject 1.8 mg into the skin daily.    . metoprolol succinate (TOPROL-XL) 25 MG 24 hr tablet Take 1 tablet (25 mg total) by mouth daily. 30 tablet 8  . potassium chloride SA (K-DUR,KLOR-CON) 20 MEQ tablet Take 2 tablets (40 mEq total) by mouth daily. 180 tablet 3   No facility-administered medications prior to visit.     Review of Systems  Constitutional:  Negative for chills, fever, malaise/fatigue and weight loss.  HENT: Negative for hearing loss, sore throat and tinnitus.   Eyes: Negative for blurred vision and double vision.  Respiratory: Positive for cough, shortness of breath and wheezing. Negative for hemoptysis, sputum production and stridor.   Cardiovascular: Positive for orthopnea, leg swelling and PND. Negative for chest pain and palpitations.  Gastrointestinal: Positive for heartburn. Negative for abdominal pain, constipation, diarrhea, nausea and vomiting.  Genitourinary: Negative for dysuria, hematuria and urgency.  Musculoskeletal: Negative for joint pain and myalgias.  Skin: Negative for itching and rash.  Neurological: Negative for dizziness, tingling, weakness and headaches.  Endo/Heme/Allergies: Negative for environmental allergies. Does not bruise/bleed easily.  Psychiatric/Behavioral: Negative for depression. The patient is not nervous/anxious and does not have insomnia.   All other systems reviewed and are negative.    Objective:  Physical Exam Vitals signs reviewed.  Constitutional:      General: She is not in acute distress.    Appearance: She is well-developed. She is obese.  HENT:     Head: Normocephalic and atraumatic.     Mouth/Throat:     Comments: Large neck mallampati 3 Eyes:     General: No scleral icterus.    Conjunctiva/sclera: Conjunctivae normal.     Pupils: Pupils are equal, round, and reactive to light.  Neck:     Musculoskeletal: Neck supple.  Vascular: No JVD.     Trachea: No tracheal deviation.  Cardiovascular:     Rate and Rhythm: Normal rate and regular rhythm.     Heart sounds: Normal heart sounds. No murmur.  Pulmonary:     Effort: Pulmonary effort is normal. No tachypnea, accessory muscle usage or respiratory distress.     Breath sounds: Normal breath sounds. No stridor. No wheezing, rhonchi or rales.  Abdominal:     General: Bowel sounds are normal. There is no distension.      Palpations: Abdomen is soft.     Tenderness: There is no abdominal tenderness.     Comments: Obese pannus  Musculoskeletal:        General: Deformity present. No tenderness.     Right lower leg: Edema present.     Left lower leg: Edema present.     Comments: Purple discolored right toes, right ankle in boot. Significant edema of the left lower extremity.  Lymphadenopathy:     Cervical: No cervical adenopathy.  Skin:    General: Skin is warm and dry.     Capillary Refill: Capillary refill takes less than 2 seconds.     Findings: No rash.  Neurological:     Mental Status: She is alert and oriented to person, place, and time.  Psychiatric:        Behavior: Behavior normal.      Vitals:   11/22/18 0932  BP: 126/64  Pulse: (!) 59  SpO2: 95%  Weight: (!) 314 lb (142.4 kg)  Height:  (1.6 m)   95% on RA BMI Readings from Last 3 Encounters:  11/22/18 55.62 kg/m  08/21/18 49.71 kg/m  07/17/18 49.71 kg/m   Wt Readings from Last 3 Encounters:  11/22/18 (!) 314 lb (142.4 kg)  08/21/18 (!) 308 lb (139.7 kg)  07/17/18 (!) 308 lb (139.7 kg)     CBC    Component Value Date/Time   WBC 10.5 08/22/2018 1117   RBC 4.15 08/22/2018 1117   HGB 12.4 08/22/2018 1117   HCT 39.5 08/22/2018 1117   PLT 226 08/22/2018 1117   MCV 95.2 08/22/2018 1117   MCH 29.9 08/22/2018 1117   MCHC 31.4 08/22/2018 1117   RDW 14.9 08/22/2018 1117   LYMPHSABS 1.5 08/22/2018 1117   MONOABS 0.6 08/22/2018 1117   EOSABS 0.1 08/22/2018 1117   BASOSABS 0.1 08/22/2018 1117    Chest Imaging: Chest x-ray 11/11/2014: Median sternotomy no acute active pulmonary process. The patient's images have been independently reviewed by me.    Pulmonary Functions Testing Results: No flowsheet data found.  FeNO: None   Pathology: None   Echocardiogram:   Gated myocardial perfusion Lexiscan: 08/22/2018:  The left ventricular ejection fraction is mildly decreased (45-54%).  Nuclear stress EF:  47%.  There was no ST segment deviation noted during stress.  Findings consistent with ischemia.  This is an intermediate risk study.   Heart Catheterization: none recent     Assessment & Plan:   Cough - Plan: Pulmonary Function Test  SOB (shortness of breath) - Plan: Pulmonary Function Test  Mild intermittent asthma without complication  ACE-inhibitor cough  Chronic diastolic heart failure (HCC)  Chronic systolic heart failure (HCC)  Discussion:  This 63 year old female with a past medical history of chronic systolic and diastolic heart failure, morbid obesity, she has ongoing shortness of breath and cough.  She has significant trouble breathing at nighttime.  She has to sleep in an upright position.  She has a  body habitus concerning for obstructive sleep apnea.  At this time I think she needs more medical management of her heart failure.  She is a former smoker of 20 pack years.  She may very well have COPD.  She has been labeled in the past with asthma.  I think this is likely most likely obesity phenotype asthma.  She has no other significant al allergic history.  At this time I think she needs to focus on dietary intake and weight loss.  Today in the office we discussed various methods to include cutting out her daily soda intake and her portion control at home.  She is also having wound healing problems in the right lower extremity due to to her diabetes and heart failure. I recommended her to follow-up in the heart failure clinic or with her cardiologist. She plans on calling them today.  We will obtain full PFTs for further evaluation.  She can follow-up in 2 weeks to have the PFTs done and review these.  In the meantime she can continue use of albuterol inhaler for shortness of breath and wheezing.  Greater than 50% of this patient 60-minute office was been face-to-face discussing above recommendations and treatment plan as well as reviewing various methods in medical  management regarding her disease processes and evaluation to include pulmonary function test which have been ordered and discussing weight loss goals.   Current Outpatient Medications:  .  albuterol (PROVENTIL HFA;VENTOLIN HFA) 108 (90 BASE) MCG/ACT inhaler, Inhale 2 puffs into the lungs every 6 (six) hours as needed for wheezing or shortness of breath., Disp: 1 Inhaler, Rfl: 1 .  aspirin EC 81 MG tablet, Take 1 tablet (81 mg total) by mouth daily., Disp: 90 tablet, Rfl: 3 .  atorvastatin (LIPITOR) 80 MG tablet, Take 1 tablet (80 mg total) by mouth daily., Disp: 30 tablet, Rfl: 8 .  clopidogrel (PLAVIX) 75 MG tablet, Take 1 tablet (75 mg total) by mouth daily., Disp: 90 tablet, Rfl: 3 .  furosemide (LASIX) 40 MG tablet, Take 3 tablets (120 mg total) by mouth 2 (two) times daily., Disp: 540 tablet, Rfl: 1 .  gabapentin (NEURONTIN) 300 MG capsule, Take 600 mg by mouth 3 (three) times daily. , Disp: , Rfl:  .  glucose blood (FREESTYLE LITE) test strip, Check bg 3 times/day, Disp: , Rfl:  .  glucose blood (PRECISION QID TEST) test strip, Check bg 3 times/day, Disp: , Rfl:  .  glucose blood test strip, Check bg 3 times/day, Disp: , Rfl:  .  insulin regular human CONCENTRATED (HUMULIN R) 500 UNIT/ML injection, INJECT 20 UNITS AT 8AM AND 12 UNITS AT 10PM WITH ADDITIONAL AS NEEDED.  MDD: 65 UNITS/DAY., Disp: , Rfl:  .  Liraglutide 18 MG/3ML SOPN, Inject 1.8 mg into the skin daily., Disp: , Rfl:  .  metoprolol succinate (TOPROL-XL) 25 MG 24 hr tablet, Take 1 tablet (25 mg total) by mouth daily., Disp: 30 tablet, Rfl: 8 .  potassium chloride SA (K-DUR,KLOR-CON) 20 MEQ tablet, Take 2 tablets (40 mEq total) by mouth daily., Disp: 180 tablet, Rfl: 3   Josephine Igo, DO [facility] 11/22/2018 9:36 AM

## 2018-11-22 NOTE — Patient Instructions (Addendum)
Thank you for visiting Dr. Tonia Brooms at [facility]. Today we recommend the following: Orders Placed This Encounter  Procedures  . Pulmonary Function Test   New Consult appt to be made with Dr. Wynona Neat for sleep evaluation.   Return in about 2 weeks (around 12/06/2018). Follow up appt scheduled with Elisha Headland, NP

## 2018-12-05 ENCOUNTER — Encounter: Payer: Self-pay | Admitting: Adult Health

## 2018-12-05 ENCOUNTER — Telehealth: Payer: Self-pay

## 2018-12-05 ENCOUNTER — Ambulatory Visit: Payer: BC Managed Care – PPO | Admitting: Adult Health

## 2018-12-05 VITALS — BP 120/70 | HR 63 | Ht 63.0 in | Wt 320.0 lb

## 2018-12-05 DIAGNOSIS — Z79899 Other long term (current) drug therapy: Secondary | ICD-10-CM | POA: Diagnosis not present

## 2018-12-05 DIAGNOSIS — I25810 Atherosclerosis of coronary artery bypass graft(s) without angina pectoris: Secondary | ICD-10-CM | POA: Diagnosis not present

## 2018-12-05 DIAGNOSIS — I5032 Chronic diastolic (congestive) heart failure: Secondary | ICD-10-CM | POA: Diagnosis not present

## 2018-12-05 DIAGNOSIS — E7800 Pure hypercholesterolemia, unspecified: Secondary | ICD-10-CM

## 2018-12-05 MED ORDER — FUROSEMIDE 40 MG PO TABS: 120.0000 mg | ORAL_TABLET | Freq: Two times a day (BID) | ORAL | 1 refills | Status: DC

## 2018-12-05 NOTE — Progress Notes (Signed)
Cardiology Office Note   Date:  12/05/2018   ID:  Felicia Rivas, DOB Apr 04, 1956, MRN [id number]  PCP:  [name], MD  Cardiologist:  Dr. Jens Som  Chief Complaint  Patient presents with  . Coronary Artery Disease  . Congestive Heart Failure  . Hypertension     History of Present Illness: Felicia Rivas is a 63 y.o. female who presents for ongoing assessment and management of CAD, with CABG  ( LIMA-LAD, SVG-OM 2, SVG-PDA). Myoview March 2016- reversible defects in the anterior and inferior basal wall.,with other history of IDDM, HTN, Grade 2 diastolic dysfunction, morbid obesity, dyslipidemia.  She was last seen in the office by Corine Shelter on 07/18/2019. She was doing well at that time. No new testing was planned.   Patient called our office this morning with increased unexplained weight gain of 15 lbs. She reports that she sees a pulmonologist who felt it was related to fluid retention and should be followed up with by cardiology.   She states that her dry weight is 300 lbs. She is steadily gaining weight with last weight at the pulmonologist office at 314. Her daughter is her caretaker and has severely restricted salt from her diet. The patient does not sleep flat, but in a recliner, often using O2 support from [place].   She has had recent labs from [facility] 12/03/2018.  Na+ 131;K+ 4.8,  Creatinine 1.67 (up from 1.39)  .  Past Medical History:  Diagnosis Date  . Asthma   . Asthma   . CAD (coronary artery disease)    a. NSTEMI 12/12 -> s/p CABGx3.   . Carotid artery disease (HCC)    a. Carotid US (08/2013): R 60-79%; L 40-59%; f/u 6 mos.  . Carotid stenosis    a. Carotid US (08/2013):  R 60-79%; L 40-59%; f/u 6 mos  . Chronic diastolic CHF (congestive heart failure) (HCC)    a. Echo 08/2011: mod LVH, nl EF. b. Echo 01/2013: EF 55-60%, mod-sev LVH, mod-sev LA dilitation  . Chronic diastolic heart failure (HCC)    a. Echo 08/2011: mod LVH, nl EF. b. Echo 01/2013: EF 55-60%,  mod-sev LVH, mod-sev LA dilitation.  . Complication of anesthesia    '' Myself & my family wake up during surgery "  . Complication of anesthesia   . Coronary artery disease    a. NSTEMI 12/12 -> s/p CABGx3.  [name] Cough   . Cough    a. 2014: Suspect a combination of CHF, GERD and ACE inhibitor-induced cough.  . Diabetes mellitus   . Diabetes mellitus (HCC)   . History of chicken pox   . HLD (hyperlipidemia)   . HTN (hypertension)   . Hyperlipidemia   . Hypertension   . Morbid obesity (HCC)   . Myocardial infarction (HCC)   . PONV (postoperative nausea and vomiting)   . S/P CABG (coronary artery bypass graft)    a. 2012 - Dr. Laneta Simmers;  LIMA-LAD, SVG-OM 2, SVG-PDA.  . S/P CABG x 3 09/07/11   Dr. Laneta Simmers;   LIMA-LAD, SVG-OM 2, SVG-PDA.  . Sleep disturbance    a. Had sleep study several years ago, wasn't told she has OSA but told she needs O2 at night.  . Sleep disturbance    Had sleep study several years ago, wasn't told she has OSA but told she needs O2 at night  . Stroke (HCC)   . Stroke [facility]) 2006   denies residual  . TIA (transient ischemic attack)  Past Surgical History:  Procedure Laterality Date  . CAROTID ANGIOGRAPHY N/A 03/17/2017   Procedure: Carotid Angiography;  Surgeon: Sherren Kerns, MD;  Location: [facility] INVASIVE CV LAB;  Service: Cardiovascular;  Laterality: N/A;  . CAROTID PTA/STENT INTERVENTION Right 03/29/2017   Procedure: Carotid PTA/Stent Intervention;  Surgeon: Sherren Kerns, MD;  Location: [facility] INVASIVE CV LAB;  Service: Cardiovascular;  Laterality: Right;  . CHOLECYSTECTOMY    . CHOLECYSTECTOMY  1990's  . CORONARY ARTERY BYPASS GRAFT    . CORONARY ARTERY BYPASS GRAFT  09/07/2011   CABG X3; Procedure: CORONARY ARTERY BYPASS GRAFTING (CABG);  Surgeon: Alleen Borne, MD;  Location: [facility] OR;  Service: Open Heart Surgery;  Laterality: N/A;  . LEFT HEART CATHETERIZATION WITH CORONARY ANGIOGRAM N/A 09/02/2011   Procedure: LEFT HEART CATHETERIZATION WITH  CORONARY ANGIOGRAM;  Surgeon: Herby Abraham, MD;  Location: [facility] C[facility] LAB;  Service: Cardiovascular;  Laterality: N/A;  . TUBAL LIGATION  1981     Current Outpatient Medications  Medication Sig Dispense Refill  . albuterol (PROVENTIL HFA;VENTOLIN HFA) 108 (90 BASE) MCG/ACT inhaler Inhale 2 puffs into the lungs every 6 (six) hours as needed for wheezing or shortness of breath. 1 Inhaler 1  . aspirin EC 81 MG tablet Take 1 tablet (81 mg total) by mouth daily. 90 tablet 3  . atorvastatin (LIPITOR) 80 MG tablet Take 1 tablet (80 mg total) by mouth daily. 30 tablet 8  . clopidogrel (PLAVIX) 75 MG tablet Take 1 tablet (75 mg total) by mouth daily. 90 tablet 3  . furosemide (LASIX) 40 MG tablet Take 3 tablets (120 mg total) by mouth 2 (two) times daily. 540 tablet 1  . gabapentin (NEURONTIN) 300 MG capsule Take 600 mg by mouth 3 (three) times daily.     [name] glucose blood (FREESTYLE LITE) test strip Check bg 3 times/day    . glucose blood (PRECISION QID TEST) test strip Check bg 3 times/day    . glucose blood test strip Check bg 3 times/day    . insulin regular human CONCENTRATED (HUMULIN R) 500 UNIT/ML injection INJECT 20 UNITS AT 8AM AND 12 UNITS AT 10PM WITH ADDITIONAL AS NEEDED.  MDD: 65 UNITS/DAY.    [name] Liraglutide 18 MG/3ML SOPN Inject 1.8 mg into the skin daily.    . metoprolol succinate (TOPROL-XL) 25 MG 24 hr tablet Take 1 tablet (25 mg total) by mouth daily. 30 tablet 8  . potassium chloride SA (K-DUR,KLOR-CON) 20 MEQ tablet Take 2 tablets (40 mEq total) by mouth daily. 180 tablet 3  . sulfamethoxazole-trimethoprim (BACTRIM DS,SEPTRA DS) 800-160 MG tablet Take 1 tablet by mouth 2 (two) times daily.     No current facility-administered medications for this visit.     Allergies:   Lisinopril; Other; and Tramadol    Social History:  The patient  reports that she quit smoking about 7 years ago. Her smoking use included cigarettes. She has a 12.50 pack-year smoking history. She has quit using  smokeless tobacco. She reports that she does not drink alcohol or use drugs.   Family History:  The patient's family history includes Cancer in her mother; Coronary artery disease in her father; Diabetes in her mother; Heart disease in an other family member; Heart failure in her mother.    ROS: All other systems are reviewed and negative. Unless otherwise mentioned in H&P    PHYSICAL EXAM: VS:  BP 120/70   Pulse 63   Ht 5\' 3"  (1.6 m)   Wt [name])  320 lb (145.2 kg)   BMI 56.69 kg/m  , BMI Body mass index is 56.69 kg/m. GEN: Well nourished, well developed, in no acute distress, obese  HEENT: normal Neck: no JVD, carotid bruits, or masses Cardiac: RRR; no murmurs, rubs, or gallops,2+ pitting edema  Respiratory:  Clear to auscultation bilaterally, normal work of breathing GI: soft, nontender, nondistended, + BS MS: no deformity or atrophy. ACE wrap to her right foot and pre-tibial with walking cast.  Skin: warm and dry, no rash Neuro:  Strength and sensation are intact Psych: euthymic mood, full affect   EKG: Not completed this office visit.   Recent Labs: 07/17/2018: NT-Pro BNP 399 08/22/2018: BUN 16; Creatinine, Ser 0.96; Hemoglobin 12.4; Platelets 226; Potassium 3.4; Sodium 141    Lipid Panel    Component Value Date/Time   CHOL 139 10/16/2017 0814   TRIG 127 10/16/2017 0814   HDL 34 (L) 10/16/2017 0814   CHOLHDL 4.1 10/16/2017 0814   CHOLHDL 4.0 11/16/2016 1503   VLDL 29 11/16/2016 1503   LDLCALC 80 10/16/2017 0814      Wt Readings from Last 3 Encounters:  12/05/18 (!) 320 lb (145.2 kg)  11/22/18 (!) 314 lb (142.4 kg)  08/21/18 (!) 308 lb (139.7 kg)      Other studies Reviewed: Echocardiogram 11-19-2014 Left ventricle: The cavity size was normal. Wall thickness was normal. Systolic function was normal. The estimated ejection fraction was in the range of 60% to 65%. Wall motion was normal; there were no regional wall motion abnormalities. Features are  consistent with a pseudonormal left ventricular filling pattern, with concomitant abnormal relaxation and increased filling pressure (grade 2 diastolic dysfunction).  ASSESSMENT AND PLAN:  1. Acute on Chronic Diastolic CHF: Her weight is up by 20 lbs by our scales, 14 lbs by her home scales. Some of this may be the walking cast she is wearing. However, at home there is a steady gain. She has been restricted on her salt intake, but this is not always followed.   I will add 40 mg of lasix in the midday, to augment her BID dosing of 120 mg BID.  She will do this for a couple of days and continue daily weights and record. She should hopefully come down about 6 lbs-8 lbs. She is advised to take the lasix on an empty stomach for better bioavailability. May consider changing to torsemide if she is becoming resistant to furosemide.   Consider repeating echo on follow up. Will have BMET checked in one week.   2. CAD: Hx of CABG as above. She has not complaints of worsening fatigue or chest pressure. She will continue ASA and Plavix as directed. She was not found to be anemic on recent labs.   3. Hypercholesterolemia: Continues on statin therapy. Most recent LDL 80 mg in 10/16/57.  4. Diabetes: Followed by PCP  Current medicines are reviewed at length with the patient today.    Labs/ tests ordered today include: BMET  Bettey Mare. Liborio Nixon, ANP, AACC   12/05/2018 2:25 PM    [facility] [address] Office [phone] Fax [phone]

## 2018-12-05 NOTE — Patient Instructions (Signed)
Medication Instructions:  TAKE EXTRA LASIX 80MG  AT NOON X2 DAYS THEN STOP AND CALL us If you need a refill on your cardiac medications before your next appointment, please call your pharmacy.  Labwork: BMET IN 1 MONTH HERE IN OUR OFFICE AT [facility]  You will NOT need to fast  Take the provided lab slips with you to the lab for your blood draw.    When you have your labs (blood work) drawn today and your tests are completely normal, you will receive your results only by MyChart Message (if you have MyChart) -OR-  A paper copy in the mail.  If you have any lab test that is abnormal or we need to change your treatment, we will call you to review these results.  Special Instructions: TAKE AND LOG YOUR WEIGHT DAILY CALL WITH WEIGHTS ON Monday.   MAKE SURE TO CALL IF YOU DO NOT LOOSE 6-8 POUNDS THIS WILL BE YOU GOAL WEIGHT  Follow-Up: You will need a follow up appointment in 6 WEEKS- 2 months.  You may see Olga Millers, MD, Joni Reining, DNP, AACC  or one of the following Advanced Practice Providers on your designated Care Team:  Azalee Course, PA-C  Micah Flesher, PA-C      At [facility], you and your health needs are our priority.  As part of our continuing mission to provide you with exceptional heart care, we have created designated Provider Care Teams.  These Care Teams include your primary Cardiologist (physician) and Advanced Practice Providers (APPs -  Physician Assistants and Nurse Practitioners) who all work together to provide you with the care you need, when you need it.  Thank you for choosing [facility] at [facility]!!

## 2018-12-05 NOTE — Telephone Encounter (Signed)
PT HAS INCREASED UNEXPLAINED SWELLING AND WEIGHT GAIN WAS TOLD BY PULMONARY MD TO BE SEE TO DISCUSS THIS. PT DOE NOT HAVE ANY OTHER SXS. APPT SCHEDULED FOR TODAY 12-05-2018  COVID-19 Pre-Screening Questions:  Provider: Jens Som    . Have you been in contact with someone that was recently sick with fever/cough or confirmed to have the Covid19 virus?  NO  *Contact with a confirmed case should stay at home, away from confirmed patient, monitor symptoms, and reach out to PCP for e-visit/additional testing.  2. Do you have any of the following symptoms [cough, fever (100.4 or greater)], and/or shortness of breath)?  NO  *ALL PTS W/ FEVER SHOULD BE REFERRED TO PCP FOR E-VISIT* _________________________________________________  Cardiac Questionnaire:    Since your last visit or hospitalization:    1. Have you been having chest pain? NO   2. Have you been having shortness of breath? NO   3. Have you been having increasing edema, wt gain, or increase in abdominal girth (pants fitting more tightly)? NO   4. Have you had any passing out spells? NO    *A YES to any of these questions would result in the appointment being kept.  *If all the answers to these questions are NO, we should indicate that given the current situation regarding the worldwide coronarvirus pandemic, at the recommendation of the CDC, we are looking to limit gatherings in our waiting area, and thus will reschedule their appointment beyond four weeks from today.

## 2018-12-07 ENCOUNTER — Ambulatory Visit: Payer: Self-pay | Admitting: Pulmonary Disease

## 2018-12-10 ENCOUNTER — Telehealth: Payer: Self-pay | Admitting: Adult Health

## 2018-12-10 ENCOUNTER — Ambulatory Visit: Payer: BC Managed Care – PPO | Admitting: Adult Health

## 2018-12-10 NOTE — Telephone Encounter (Signed)
Looks like weights are staying lower. Continue to take extra lasix as needed, with daily weights

## 2018-12-10 NOTE — Telephone Encounter (Signed)
LM2CB- TAKE LASIX BID AND AS NEEDED AT NOON FOR ADDITIONAL SWELLING

## 2018-12-10 NOTE — Telephone Encounter (Signed)
New Message   Patients daughter is calling on her bahalf. She states they were to keep a log of her mothers weight. The weights were all taken without the boot.     3/19 318 3/20 315 3/21 315 3/22 313 (without the extra lasix)  3/23 314.8 (without the extra lasix)

## 2018-12-11 ENCOUNTER — Institutional Professional Consult (permissible substitution): Payer: Self-pay | Admitting: Pulmonary Disease

## 2018-12-11 NOTE — Telephone Encounter (Signed)
Pt notified she states that she will let her daughter know and will only take lasix BID

## 2018-12-13 ENCOUNTER — Telehealth: Payer: Self-pay | Admitting: *Deleted

## 2018-12-13 NOTE — Telephone Encounter (Signed)
Call from patient's daughter. C/O no doppler pulses at wound care center today. She is going for wound care treatment s/p ankle fracture and non-healing wound. Today was her second visit.  She has had recent increase coldness and decreased sensation(right great toe) to both feet. Increase in redness. She states blanching present to both feet. Will see NP post ABI's on 12/14/2018. Instructed to report to the ER for any acute/worsening condition.

## 2018-12-14 ENCOUNTER — Emergency Department (HOSPITAL_COMMUNITY): Payer: BC Managed Care – PPO

## 2018-12-14 ENCOUNTER — Inpatient Hospital Stay (HOSPITAL_COMMUNITY): Admission: RE | Admit: 2018-12-14 | Payer: Self-pay | Source: Ambulatory Visit

## 2018-12-14 ENCOUNTER — Other Ambulatory Visit (HOSPITAL_COMMUNITY): Payer: Self-pay | Admitting: Vascular Surgery

## 2018-12-14 ENCOUNTER — Encounter (HOSPITAL_COMMUNITY): Payer: Self-pay | Admitting: Emergency Medicine

## 2018-12-14 ENCOUNTER — Ambulatory Visit: Payer: Self-pay | Admitting: Family

## 2018-12-14 ENCOUNTER — Observation Stay (HOSPITAL_COMMUNITY)
Admission: EM | Admit: 2018-12-14 | Discharge: 2018-12-15 | Disposition: A | Discharge status: Home / Self Care | Payer: BC Managed Care – PPO | Attending: Internal Medicine | Admitting: Internal Medicine

## 2018-12-14 ENCOUNTER — Other Ambulatory Visit: Payer: Self-pay

## 2018-12-14 ENCOUNTER — Observation Stay (HOSPITAL_COMMUNITY): Payer: BC Managed Care – PPO

## 2018-12-14 DIAGNOSIS — Z951 Presence of aortocoronary bypass graft: Secondary | ICD-10-CM | POA: Insufficient documentation

## 2018-12-14 DIAGNOSIS — Z87891 Personal history of nicotine dependence: Secondary | ICD-10-CM | POA: Diagnosis not present

## 2018-12-14 DIAGNOSIS — E11621 Type 2 diabetes mellitus with foot ulcer: Secondary | ICD-10-CM | POA: Insufficient documentation

## 2018-12-14 DIAGNOSIS — Z6841 Body Mass Index (BMI) 40.0 and over, adult: Secondary | ICD-10-CM | POA: Insufficient documentation

## 2018-12-14 DIAGNOSIS — Z7982 Long term (current) use of aspirin: Secondary | ICD-10-CM | POA: Insufficient documentation

## 2018-12-14 DIAGNOSIS — Y838 Other surgical procedures as the cause of abnormal reaction of the patient, or of later complication, without mention of misadventure at the time of the procedure: Secondary | ICD-10-CM | POA: Diagnosis not present

## 2018-12-14 DIAGNOSIS — L089 Local infection of the skin and subcutaneous tissue, unspecified: Secondary | ICD-10-CM | POA: Diagnosis present

## 2018-12-14 DIAGNOSIS — T8141XA Infection following a procedure, superficial incisional surgical site, initial encounter: Secondary | ICD-10-CM | POA: Diagnosis not present

## 2018-12-14 DIAGNOSIS — T148XXA Other injury of unspecified body region, initial encounter: Secondary | ICD-10-CM

## 2018-12-14 DIAGNOSIS — M5137 Other intervertebral disc degeneration, lumbosacral region: Secondary | ICD-10-CM | POA: Insufficient documentation

## 2018-12-14 DIAGNOSIS — R69 Illness, unspecified: Secondary | ICD-10-CM

## 2018-12-14 DIAGNOSIS — E785 Hyperlipidemia, unspecified: Secondary | ICD-10-CM | POA: Diagnosis not present

## 2018-12-14 DIAGNOSIS — E119 Type 2 diabetes mellitus without complications: Secondary | ICD-10-CM

## 2018-12-14 DIAGNOSIS — N179 Acute kidney failure, unspecified: Secondary | ICD-10-CM | POA: Diagnosis not present

## 2018-12-14 DIAGNOSIS — Z794 Long term (current) use of insulin: Secondary | ICD-10-CM | POA: Insufficient documentation

## 2018-12-14 DIAGNOSIS — J45909 Unspecified asthma, uncomplicated: Secondary | ICD-10-CM | POA: Diagnosis not present

## 2018-12-14 DIAGNOSIS — I110 Hypertensive heart disease with heart failure: Secondary | ICD-10-CM | POA: Insufficient documentation

## 2018-12-14 DIAGNOSIS — Z8614 Personal history of Methicillin resistant Staphylococcus aureus infection: Secondary | ICD-10-CM | POA: Insufficient documentation

## 2018-12-14 DIAGNOSIS — Z8673 Personal history of transient ischemic attack (TIA), and cerebral infarction without residual deficits: Secondary | ICD-10-CM | POA: Insufficient documentation

## 2018-12-14 DIAGNOSIS — I252 Old myocardial infarction: Secondary | ICD-10-CM | POA: Diagnosis not present

## 2018-12-14 DIAGNOSIS — E559 Vitamin D deficiency, unspecified: Secondary | ICD-10-CM | POA: Insufficient documentation

## 2018-12-14 DIAGNOSIS — I2510 Atherosclerotic heart disease of native coronary artery without angina pectoris: Secondary | ICD-10-CM | POA: Insufficient documentation

## 2018-12-14 DIAGNOSIS — L89619 Pressure ulcer of right heel, unspecified stage: Secondary | ICD-10-CM | POA: Diagnosis present

## 2018-12-14 DIAGNOSIS — D72829 Elevated white blood cell count, unspecified: Secondary | ICD-10-CM | POA: Insufficient documentation

## 2018-12-14 DIAGNOSIS — I5042 Chronic combined systolic (congestive) and diastolic (congestive) heart failure: Secondary | ICD-10-CM | POA: Diagnosis not present

## 2018-12-14 DIAGNOSIS — Z955 Presence of coronary angioplasty implant and graft: Secondary | ICD-10-CM | POA: Insufficient documentation

## 2018-12-14 DIAGNOSIS — Z9981 Dependence on supplemental oxygen: Secondary | ICD-10-CM | POA: Diagnosis not present

## 2018-12-14 DIAGNOSIS — Z79899 Other long term (current) drug therapy: Secondary | ICD-10-CM | POA: Insufficient documentation

## 2018-12-14 DIAGNOSIS — I700 Atherosclerosis of aorta: Secondary | ICD-10-CM | POA: Insufficient documentation

## 2018-12-14 DIAGNOSIS — Z8249 Family history of ischemic heart disease and other diseases of the circulatory system: Secondary | ICD-10-CM | POA: Insufficient documentation

## 2018-12-14 DIAGNOSIS — K610 Anal abscess: Principal | ICD-10-CM

## 2018-12-14 DIAGNOSIS — E1151 Type 2 diabetes mellitus with diabetic peripheral angiopathy without gangrene: Secondary | ICD-10-CM | POA: Insufficient documentation

## 2018-12-14 DIAGNOSIS — Z7902 Long term (current) use of antithrombotics/antiplatelets: Secondary | ICD-10-CM | POA: Insufficient documentation

## 2018-12-14 DIAGNOSIS — I739 Peripheral vascular disease, unspecified: Secondary | ICD-10-CM

## 2018-12-14 LAB — URINALYSIS, ROUTINE W REFLEX MICROSCOPIC
Bilirubin Urine: NEGATIVE
Glucose, UA: 50 mg/dL — AB
Ketones, ur: NEGATIVE mg/dL
LEUKOCYTE UA: NEGATIVE
NITRITE: NEGATIVE
Protein, ur: NEGATIVE mg/dL
Specific Gravity, Urine: 1.013 (ref 1.005–1.030)
pH: 5 (ref 5.0–8.0)

## 2018-12-14 LAB — BASIC METABOLIC PANEL
Anion gap: 16 — ABNORMAL HIGH (ref 5–15)
BUN: 32 mg/dL — ABNORMAL HIGH (ref 8–23)
CO2: 25 mmol/L (ref 22–32)
Calcium: 9.6 mg/dL (ref 8.9–10.3)
Chloride: 98 mmol/L (ref 98–111)
Creatinine, Ser: 1.81 mg/dL — ABNORMAL HIGH (ref 0.44–1.00)
GFR calc Af Amer: 34 mL/min — ABNORMAL LOW (ref >60)
GFR calc non Af Amer: 29 mL/min — ABNORMAL LOW (ref >60)
Glucose, Bld: 109 mg/dL — ABNORMAL HIGH (ref 70–99)
Potassium: 4 mmol/L (ref 3.5–5.1)
Sodium: 139 mmol/L (ref 135–145)

## 2018-12-14 LAB — CBC WITH DIFFERENTIAL/PLATELET
ABS IMMATURE GRANULOCYTES: 0.05 10*3/uL (ref 0.00–0.07)
BASOS ABS: 0.1 10*3/uL (ref 0.0–0.1)
Basophils Relative: 0 %
Eosinophils Absolute: 0 10*3/uL (ref 0.0–0.5)
Eosinophils Relative: 0 %
HCT: 36.9 % (ref 36.0–46.0)
Hemoglobin: 11.6 g/dL — ABNORMAL LOW (ref 12.0–15.0)
IMMATURE GRANULOCYTES: 0 %
Lymphocytes Relative: 9 %
Lymphs Abs: 1.3 10*3/uL (ref 0.7–4.0)
MCH: 29.4 pg (ref 26.0–34.0)
MCHC: 31.4 g/dL (ref 30.0–36.0)
MCV: 93.7 fL (ref 80.0–100.0)
Monocytes Absolute: 0.5 10*3/uL (ref 0.1–1.0)
Monocytes Relative: 4 %
NEUTROS PCT: 87 %
Neutro Abs: 11.4 10*3/uL — ABNORMAL HIGH (ref 1.7–7.7)
Platelets: 246 10*3/uL (ref 150–400)
RBC: 3.94 MIL/uL (ref 3.87–5.11)
RDW: 15.4 % (ref 11.5–15.5)
WBC: 13.3 10*3/uL — ABNORMAL HIGH (ref 4.0–10.5)
nRBC: 0 % (ref 0.0–0.2)

## 2018-12-14 LAB — GLUCOSE, CAPILLARY
Glucose-Capillary: 151 mg/dL — ABNORMAL HIGH (ref 70–99)
Glucose-Capillary: 382 mg/dL — ABNORMAL HIGH (ref 70–99)

## 2018-12-14 LAB — LACTIC ACID, PLASMA: Lactic Acid, Venous: 1.9 mmol/L (ref 0.5–1.9)

## 2018-12-14 MED ORDER — ENOXAPARIN SODIUM 30 MG/0.3ML ~~LOC~~ SOLN
30.0000 mg | SUBCUTANEOUS | Status: DC
  Filled 2018-12-14: qty 0.3

## 2018-12-14 MED ORDER — FUROSEMIDE 20 MG PO TABS
120.0000 mg | ORAL_TABLET | Freq: Two times a day (BID) | ORAL | Status: DC
  Administered 2018-12-14 – 2018-12-15 (×2): 120 mg via ORAL
  Filled 2018-12-14 (×2): qty 6

## 2018-12-14 MED ORDER — PIPERACILLIN-TAZOBACTAM 3.375 G IVPB 30 MIN
3.3750 g | Freq: Once | INTRAVENOUS | Status: AC
  Administered 2018-12-14: 3.375 g via INTRAVENOUS
  Filled 2018-12-14: qty 50

## 2018-12-14 MED ORDER — CLOPIDOGREL BISULFATE 75 MG PO TABS
75.0000 mg | ORAL_TABLET | Freq: Every day | ORAL | Status: DC
  Administered 2018-12-14 – 2018-12-15 (×2): 75 mg via ORAL
  Filled 2018-12-14 (×2): qty 1

## 2018-12-14 MED ORDER — LIDOCAINE-EPINEPHRINE (PF) 2 %-1:200000 IJ SOLN
10.0000 mL | Freq: Once | INTRAMUSCULAR | Status: AC
  Filled 2018-12-14: qty 20

## 2018-12-14 MED ORDER — ENOXAPARIN SODIUM 40 MG/0.4ML ~~LOC~~ SOLN
40.0000 mg | SUBCUTANEOUS | Status: DC
  Administered 2018-12-15: 40 mg via SUBCUTANEOUS
  Filled 2018-12-14: qty 0.4

## 2018-12-14 MED ORDER — COLLAGENASE 250 UNIT/GM EX OINT
TOPICAL_OINTMENT | Freq: Every day | CUTANEOUS | Status: DC
  Administered 2018-12-14 – 2018-12-15 (×2): via TOPICAL
  Filled 2018-12-14: qty 30

## 2018-12-14 MED ORDER — ASPIRIN EC 81 MG PO TBEC
81.0000 mg | DELAYED_RELEASE_TABLET | Freq: Every day | ORAL | Status: DC
  Administered 2018-12-14 – 2018-12-15 (×2): 81 mg via ORAL
  Filled 2018-12-14 (×2): qty 1

## 2018-12-14 MED ORDER — ACETAMINOPHEN 325 MG PO TABS: 650.0000 mg | ORAL_TABLET | Freq: Four times a day (QID) | ORAL | Status: DC | PRN

## 2018-12-14 MED ORDER — INSULIN ASPART 100 UNIT/ML ~~LOC~~ SOLN
8.0000 [IU] | Freq: Once | SUBCUTANEOUS | Status: AC
  Administered 2018-12-14: 8 [IU] via SUBCUTANEOUS

## 2018-12-14 MED ORDER — ATORVASTATIN CALCIUM 80 MG PO TABS
80.0000 mg | ORAL_TABLET | Freq: Every day | ORAL | Status: DC
  Administered 2018-12-14 – 2018-12-15 (×2): 80 mg via ORAL
  Filled 2018-12-14 (×2): qty 1

## 2018-12-14 MED ORDER — VANCOMYCIN HCL 10 G IV SOLR
1500.0000 mg | INTRAVENOUS | Status: DC
  Filled 2018-12-14: qty 1500

## 2018-12-14 MED ORDER — MOMETASONE FURO-FORMOTEROL FUM 200-5 MCG/ACT IN AERO
2.0000 | INHALATION_SPRAY | Freq: Two times a day (BID) | RESPIRATORY_TRACT | Status: DC
  Administered 2018-12-14 – 2018-12-15 (×2): 2 via RESPIRATORY_TRACT
  Filled 2018-12-14: qty 8.8

## 2018-12-14 MED ORDER — ALBUTEROL SULFATE (2.5 MG/3ML) 0.083% IN NEBU: 3.0000 mL | INHALATION_SOLUTION | Freq: Four times a day (QID) | RESPIRATORY_TRACT | Status: DC | PRN

## 2018-12-14 MED ORDER — AMOXICILLIN-POT CLAVULANATE 875-125 MG PO TABS
1.0000 | ORAL_TABLET | Freq: Two times a day (BID) | ORAL | Status: DC
  Administered 2018-12-14 – 2018-12-15 (×2): 1 via ORAL
  Filled 2018-12-14 (×2): qty 1

## 2018-12-14 MED ORDER — INSULIN ASPART 100 UNIT/ML ~~LOC~~ SOLN: 0.0000 [IU] | Freq: Three times a day (TID) | SUBCUTANEOUS | Status: DC

## 2018-12-14 MED ORDER — GABAPENTIN 300 MG PO CAPS
600.0000 mg | ORAL_CAPSULE | Freq: Three times a day (TID) | ORAL | Status: DC
  Administered 2018-12-14 – 2018-12-15 (×3): 600 mg via ORAL
  Filled 2018-12-14 (×3): qty 2

## 2018-12-14 MED ORDER — METOPROLOL SUCCINATE ER 25 MG PO TB24
25.0000 mg | ORAL_TABLET | Freq: Every day | ORAL | Status: DC
  Administered 2018-12-14 – 2018-12-15 (×2): 25 mg via ORAL
  Filled 2018-12-14 (×2): qty 1

## 2018-12-14 MED ORDER — VANCOMYCIN HCL 10 G IV SOLR
2500.0000 mg | Freq: Once | INTRAVENOUS | Status: AC
  Administered 2018-12-14: 2500 mg via INTRAVENOUS
  Filled 2018-12-14: qty 2500

## 2018-12-14 MED ORDER — INSULIN ASPART 100 UNIT/ML ~~LOC~~ SOLN
0.0000 [IU] | Freq: Three times a day (TID) | SUBCUTANEOUS | Status: DC
  Administered 2018-12-14: 3 [IU] via SUBCUTANEOUS
  Administered 2018-12-15: 8 [IU] via SUBCUTANEOUS

## 2018-12-14 MED ORDER — ACETAMINOPHEN 650 MG RE SUPP: 650.0000 mg | Freq: Four times a day (QID) | RECTAL | Status: DC | PRN

## 2018-12-14 MED ORDER — METRONIDAZOLE IN NACL 5-0.79 MG/ML-% IV SOLN
500.0000 mg | Freq: Once | INTRAVENOUS | Status: AC
  Administered 2018-12-14: 500 mg via INTRAVENOUS
  Filled 2018-12-14: qty 100

## 2018-12-14 MED ORDER — POTASSIUM CHLORIDE CRYS ER 20 MEQ PO TBCR
40.0000 meq | EXTENDED_RELEASE_TABLET | Freq: Every day | ORAL | Status: DC
  Administered 2018-12-14 – 2018-12-15 (×2): 40 meq via ORAL
  Filled 2018-12-14 (×2): qty 2

## 2018-12-14 MED ORDER — INSULIN ASPART 100 UNIT/ML ~~LOC~~ SOLN: 0.0000 [IU] | Freq: Every day | SUBCUTANEOUS | Status: DC

## 2018-12-14 NOTE — ED Notes (Signed)
ED TO INPATIENT HANDOFF REPORT  ED Nurse Name and Phone #: Henderson [place] [phone]  S Name/Age/Gender Felicia Rivas 63 y.o. female Room/Bed: 036C/036C  Code Status   Code Status: Full Code  Home/SNF/Other Home Patient oriented to: self, place, time and situation Is this baseline? Yes   Triage Complete: Triage complete  Chief Complaint infection in foot  Triage Note Pt arrived POV with complaints of issues with her foot. Went to wound care yesterday and had issues finding a pulse. Pt denies pain at this time. Pt endorces having chills this morning.    Allergies Allergies  Allergen Reactions  . Lisinopril Cough, Nausea And Vomiting and Other (See Comments)    ACE Inhibitor Cough ?Cough - changed to ARB 02/2013. ACE Inhibitor Cough ACE Inhibitor Cough ?Cough - changed to ARB 02/2013. ACE Inhibitor Cough ?Cough - changed to ARB 02/2013.  [name] Other Hives    Unknown IV antibiotic  . Tramadol     Causes respiratory distress    Level of Care/Admitting Diagnosis ED Disposition    ED Disposition Condition Comment   Admit  Hospital Area: [facility] [[id number]]  Level of Care: Med-Surg [16]  Diagnosis: Perianal abscess [446950]  Admitting Physician: Earl Lagos [[id number]]  Attending Physician: Earl Lagos [phone]  PT Class (Do Not Modify): Observation [104]  PT Acc Code (Do Not Modify): Observation [10022]       B Medical/Surgery History Past Medical History:  Diagnosis Date  . Asthma   . Asthma   . CAD (coronary artery disease)    a. NSTEMI 12/12 -> s/p CABGx3.   . Carotid artery disease (HCC)    a. Carotid US (08/2013): R 60-79%; L 40-59%; f/u 6 mos.  . Carotid stenosis    a. Carotid US (08/2013):  R 60-79%; L 40-59%; f/u 6 mos  . Chronic diastolic CHF (congestive heart failure) (HCC)    a. Echo 08/2011: mod LVH, nl EF. b. Echo 01/2013: EF 55-60%, mod-sev LVH, mod-sev LA dilitation  . Chronic diastolic heart failure (HCC)    a. Echo 08/2011:  mod LVH, nl EF. b. Echo 01/2013: EF 55-60%, mod-sev LVH, mod-sev LA dilitation.  . Complication of anesthesia    '' Myself & my family wake up during surgery "  . Complication of anesthesia   . Coronary artery disease    a. NSTEMI 12/12 -> s/p CABGx3.  [name] Cough   . Cough    a. 2014: Suspect a combination of CHF, GERD and ACE inhibitor-induced cough.  . Diabetes mellitus   . Diabetes mellitus (HCC)   . History of chicken pox   . HLD (hyperlipidemia)   . HTN (hypertension)   . Hyperlipidemia   . Hypertension   . Morbid obesity (HCC)   . Myocardial infarction (HCC)   . PONV (postoperative nausea and vomiting)   . S/P CABG (coronary artery bypass graft)    a. 2012 - Dr. Laneta Simmers;  LIMA-LAD, SVG-OM 2, SVG-PDA.  . S/P CABG x 3 09/07/11   Dr. Laneta Simmers;   LIMA-LAD, SVG-OM 2, SVG-PDA.  . Sleep disturbance    a. Had sleep study several years ago, wasn't told she has OSA but told she needs O2 at night.  . Sleep disturbance    Had sleep study several years ago, wasn't told she has OSA but told she needs O2 at night  . Stroke (HCC)   . Stroke [facility]) 2006   denies residual  . TIA (transient ischemic attack)    Past Surgical  History:  Procedure Laterality Date  . CAROTID ANGIOGRAPHY N/A 03/17/2017   Procedure: Carotid Angiography;  Surgeon: Sherren Kerns, MD;  Location: [facility] INVASIVE CV LAB;  Service: Cardiovascular;  Laterality: N/A;  . CAROTID PTA/STENT INTERVENTION Right 03/29/2017   Procedure: Carotid PTA/Stent Intervention;  Surgeon: Sherren Kerns, MD;  Location: [facility] INVASIVE CV LAB;  Service: Cardiovascular;  Laterality: Right;  . CHOLECYSTECTOMY    . CHOLECYSTECTOMY  1990's  . CORONARY ARTERY BYPASS GRAFT    . CORONARY ARTERY BYPASS GRAFT  09/07/2011   CABG X3; Procedure: CORONARY ARTERY BYPASS GRAFTING (CABG);  Surgeon: Alleen Borne, MD;  Location: [facility] OR;  Service: Open Heart Surgery;  Laterality: N/A;  . LEFT HEART CATHETERIZATION WITH CORONARY ANGIOGRAM N/A 09/02/2011    Procedure: LEFT HEART CATHETERIZATION WITH CORONARY ANGIOGRAM;  Surgeon: Herby Abraham, MD;  Location: [facility] CATH LAB;  Service: Cardiovascular;  Laterality: N/A;  . TUBAL LIGATION  1981     A IV Location/Drains/Wounds Patient Lines/Drains/Airways Status   Active Line/Drains/Airways    Name:   Placement date:   Placement time:   Site:   Days:   Peripheral IV 12/14/18 Right Wrist   12/14/18    0915    Wrist   less than 1          Intake/Output Last 24 hours  Intake/Output Summary (Last 24 hours) at 12/14/2018 1316 Last data filed at 12/14/2018 1304 Gross per 24 hour  Intake 100 ml  Output -  Net 100 ml    Labs/Imaging Results for orders placed or performed during the hospital encounter of 12/14/18 (from the past 48 hour(s))  Basic metabolic panel     Status: Abnormal   Collection Time: 12/14/18  9:12 AM  Result Value Ref Range   Sodium 139 135 - 145 mmol/L   Potassium 4.0 3.5 - 5.1 mmol/L   Chloride 98 98 - 111 mmol/L   CO2 25 22 - 32 mmol/L   Glucose, Bld 109 (H) 70 - 99 mg/dL   BUN 32 (H) 8 - 23 mg/dL   Creatinine, Ser 0.60 (H) 0.44 - 1.00 mg/dL   Calcium 9.6 8.9 - 04.5 mg/dL   GFR calc non Af Amer 29 (L) >60 mL/min   GFR calc Af Amer 34 (L) >60 mL/min   Anion gap 16 (H) 5 - 15    Comment: Performed at [facility] Lab, [address] [place], [place] [address]  Lactic acid, plasma     Status: None   Collection Time: 12/14/18  9:12 AM  Result Value Ref Range   Lactic Acid, Venous 1.9 0.5 - 1.9 mmol/L    Comment: Performed at [facility] Lab, [address] [place], [place] [address]  CBC with Differential     Status: Abnormal   Collection Time: 12/14/18  9:12 AM  Result Value Ref Range   WBC 13.3 (H) 4.0 - 10.5 K/uL   RBC 3.94 3.87 - 5.11 MIL/uL   Hemoglobin 11.6 (L) 12.0 - 15.0 g/dL   HCT 53.2 02.3 - 34.3 %   MCV 93.7 80.0 - 100.0 fL   MCH 29.4 26.0 - 34.0 pg   MCHC 31.4 30.0 - 36.0 g/dL   RDW 56.8 61.6 - 83.7 %   Platelets 246 150 - 400 K/uL   nRBC  0.0 0.0 - 0.2 %   Neutrophils Relative % 87 %   Neutro Abs 11.4 (H) 1.7 - 7.7 K/uL   Lymphocytes Relative 9 %  Lymphs Abs 1.3 0.7 - 4.0 K/uL   Monocytes Relative 4 %   Monocytes Absolute 0.5 0.1 - 1.0 K/uL   Eosinophils Relative 0 %   Eosinophils Absolute 0.0 0.0 - 0.5 K/uL   Basophils Relative 0 %   Basophils Absolute 0.1 0.0 - 0.1 K/uL   Immature Granulocytes 0 %   Abs Immature Granulocytes 0.05 0.00 - 0.07 K/uL    Comment: Performed at [facility] Lab, [address] [place], [place] [address]   Ct Pelvis Wo Contrast  Result Date: 12/14/2018 CLINICAL DATA:  Pt began having night sweats last night,chills; has a non-healing fx. of RIGHT foot . Pt also had pain in her LEFT buttock; pt states that she has had "boils" in the past, and that "they will usually burst and clear up on their own EXAM: CT PELVIS WITHOUT CONTRAST TECHNIQUE: Multidetector CT imaging of the pelvis was performed following the standard protocol without intravenous contrast. COMPARISON:  None. FINDINGS: Urinary Tract:  No abnormality visualized. Bowel:  Unremarkable visualized pelvic bowel loops. Vascular/Lymphatic: Normal caliber abdominal aorta with atherosclerosis. 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 Musculoskeletal: No acute osseous abnormality. No aggressive osseous lesion. Degenerative disc disease with disc height loss at L4-5 and L5-S1. Bilateral foraminal stenosis at L5-S1. IMPRESSION: 1. 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 2.  Aortic Atherosclerosis (ICD10-I70.0). Electronically Signed   By: Elige Ko   On: 12/14/2018 11:42    Dg Foot Complete Right  Result Date: 12/14/2018 CLINICAL DATA:  Diabetic patient right foot pain.  No known injury. EXAM: RIGHT FOOT COMPLETE - 3+ VIEW COMPARISON:  None. FINDINGS: No acute bony or joint abnormality is identified. There is partial visualization of fixation of a distal fibular fracture and syndesmotic stabilization. Fracture lines are visible. A linear radiopaque foreign body most consistent with a needle is seen in the plantar soft tissues of the foot and measures 1.2 cm in length. The needle is positioned along the plantar surface of the distal diaphysis of the second metatarsal. No soft tissue gas, bony destructive change or periosteal reaction is identified. Soft tissues about the ankle and proximal foot appear swollen. IMPRESSION: Negative for acute bony abnormality. Soft tissue swelling about the proximal foot and ankle. Metallic foreign body along the plantar surface of the second metatarsal is most consistent with a needle measuring 1.2 cm. Status post fixation of a distal fibular fracture and stabilization of the syndesmosis. Electronically Signed   By: Drusilla Kanner M.D.   On: 12/14/2018 09:45    Pending Labs Unresulted Labs (From admission, onward)    Start     Ordered   12/15/18 0500  HIV antibody (Routine Testing)  Tomorrow morning,   R     12/14/18 1307   12/15/18 0500  CBC  Tomorrow morning,   R     12/14/18 1307   12/15/18 0500  Comprehensive metabolic panel  Tomorrow morning,   R     12/14/18 1307   12/14/18 1020  Urinalysis, Routine w reflex microscopic  ONCE - STAT,   STAT     12/14/18 1019   12/14/18 1020  Urine culture  ONCE - STAT,  STAT     12/14/18 1019   12/14/18 0842  Culture, blood (routine x 2)  BLOOD CULTURE X 2,   STAT     12/14/18 0842          Vitals/Pain Today's Vitals   12/14/18 1132 12/14/18 1200 12/14/18 1300 12/14/18 1308  BP:  (!) 110/59 116/63   Pulse:  71 79   Resp:  16 18   Temp:      TempSrc:      SpO2:  93% 96%    Weight:      Height:      PainSc: 0-No pain   0-No pain    Isolation Precautions No active isolations  Medications Medications  piperacillin-tazobactam (ZOSYN) IVPB 3.375 g (3.375 g Intravenous New Bag/Given 12/14/18 1307)  vancomycin (VANCOCIN) 2,500 mg in sodium chloride 0.9 % 500 mL IVPB (2,500 mg Intravenous New Bag/Given 12/14/18 1308)  vancomycin (VANCOCIN) 1,500 mg in sodium chloride 0.9 % 500 mL IVPB (has no administration in time range)  lidocaine-EPINEPHrine (XYLOCAINE W/EPI) 2 %-1:200000 (PF) injection 10 mL (has no administration in time range)  enoxaparin (LOVENOX) injection 30 mg (has no administration in time range)  acetaminophen (TYLENOL) tablet 650 mg (has no administration in time range)    Or  acetaminophen (TYLENOL) suppository 650 mg (has no administration in time range)  aspirin EC tablet 81 mg (has no administration in time range)  albuterol (PROVENTIL HFA;VENTOLIN HFA) 108 (90 Base) MCG/ACT inhaler 2 puff (has no administration in time range)  atorvastatin (LIPITOR) tablet 80 mg (has no administration in time range)  mometasone-formoterol (DULERA) 200-5 MCG/ACT inhaler 2 puff (has no administration in time range)  clopidogrel (PLAVIX) tablet 75 mg (has no administration in time range)  furosemide (LASIX) tablet 120 mg (has no administration in time range)  metoprolol succinate (TOPROL-XL) 24 hr tablet 25 mg (has no administration in time range)  gabapentin (NEURONTIN) capsule 600 mg (has no administration in time range)  potassium chloride SA (K-DUR,KLOR-CON) CR tablet 40 mEq (has no administration in time range)  insulin aspart (novoLOG) injection 0-5 Units (has no administration in time range)  metroNIDAZOLE (FLAGYL) IVPB 500 mg (0 mg Intravenous Stopped 12/14/18 1304)    Mobility manual wheelchair High fall risk   Focused Assessments Cardiac Assessment Handoff:  Cardiac Rhythm: Normal sinus rhythm Lab Results  Component Value Date   CKTOTAL 133  09/02/2011   CKMB 9.7 (HH) 09/02/2011   TROPONINI 0.03 11/11/2014   No results found for: DDIMER Does the Patient currently have chest pain? No     R Recommendations: See Admitting Provider Note  Report given to:   Additional Notes:

## 2018-12-14 NOTE — ED Notes (Signed)
Pt. returned from XR. 

## 2018-12-14 NOTE — Consult Note (Signed)
Reason for Consult:Foot wound Referring Physician: Aeriel Boulay is an 63 y.o. female.  HPI: Felicia Rivas comes to the hospital with chills beginning last night. She continued to feel bad this morning. She was unable to check her temperature. She underwent ORIF of an ankle fx in [place] in early February. At her first post-op visit she was placed on Bactrim. She developed the heel decub while her splint was on and was referred to wound care. 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 She denies pain in the foot or leg. 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 She was also diagnosed with a perirectal abscess by the EDP.  Past Medical History:  Diagnosis Date  . Asthma   . Asthma   . CAD (coronary artery disease)    a. NSTEMI 12/12 -> s/p CABGx3.   . Carotid artery disease (HCC)    a. Carotid US (08/2013): R 60-79%; L 40-59%; f/u 6 mos.  . Carotid stenosis    a. Carotid US (08/2013):  R 60-79%; L 40-59%; f/u 6 mos  . Chronic diastolic CHF (congestive heart failure) (HCC)    a. Echo 08/2011: mod LVH, nl EF. b. Echo 01/2013: EF 55-60%, mod-sev LVH, mod-sev LA dilitation  . Chronic diastolic heart failure (HCC)    a. Echo 08/2011: mod LVH, nl EF. b. Echo 01/2013: EF 55-60%, mod-sev LVH, mod-sev LA dilitation.  . Complication of anesthesia    '' Myself & my family wake up during surgery "  . Complication of anesthesia   . Coronary artery disease    a. NSTEMI 12/12 -> s/p CABGx3.  [name] Cough   . Cough    a. 2014: Suspect a combination of CHF, GERD and ACE inhibitor-induced cough.  . Diabetes mellitus   . Diabetes mellitus (HCC)   . History of chicken pox   . HLD (hyperlipidemia)   . HTN (hypertension)   . Hyperlipidemia   . Hypertension   . Morbid obesity (HCC)   . Myocardial infarction (HCC)   . PONV (postoperative nausea and vomiting)   . S/P CABG (coronary artery bypass graft)    a. 2012 - Dr.  Laneta Simmers;  LIMA-LAD, SVG-OM 2, SVG-PDA.  . S/P CABG x 3 09/07/11   Dr. Laneta Simmers;   LIMA-LAD, SVG-OM 2, SVG-PDA.  . Sleep disturbance    a. Had sleep study several years ago, wasn't told she has OSA but told she needs O2 at night.  . Sleep disturbance    Had sleep study several years ago, wasn't told she has OSA but told she needs O2 at night  . Stroke (HCC)   . Stroke [facility]) 2006   denies residual  . TIA (transient ischemic attack)     Past Surgical History:  Procedure Laterality Date  . CAROTID ANGIOGRAPHY N/A 03/17/2017   Procedure: Carotid Angiography;  Surgeon: Sherren Kerns, MD;  Location[facility] INVASIVE CV LAB;  Service: Cardiovascular;  Laterality: N/A;  . CAROTID PTA/STENT INTERVENTION Right 03/29/2017   Procedure: Carotid PTA/Stent Intervention;  Surgeon: Sherren Kerns, MD;  Location: [facility] INVASIVE CV LAB;  Service: Cardiovascular;  Laterality: Right;  . CHOLECYSTECTOMY    . CHOLECYSTECTOMY  1990's  . CORONARY ARTERY BYPASS GRAFT    . CORONARY ARTERY BYPASS GRAFT  09/07/2011   CABG X3; Procedure: CORONARY ARTERY BYPASS GRAFTING (CABG);  Surgeon: Alleen Borne, MD;  Location: [facility] OR;  Service:  Open Heart Surgery;  Laterality: N/A;  . LEFT HEART CATHETERIZATION WITH CORONARY ANGIOGRAM N/A 09/02/2011   Procedure: LEFT HEART CATHETERIZATION WITH CORONARY ANGIOGRAM;  Surgeon: Herby Abraham, MD;  Location: [facility] CATH LAB;  Service: Cardiovascular;  Laterality: N/A;  . TUBAL LIGATION  1981    Family History  Problem Relation Age of Onset  . Cancer Mother   . Heart failure Mother        Also has hx MVP s/p MVR  . Diabetes Mother   . Coronary artery disease Father        Died of MI at age 23  . Heart disease Other        Parent    Social History:  reports that she quit smoking about 7 years ago. Her smoking use included cigarettes. She has a 12.50 pack-year smoking history. She has quit using smokeless tobacco. She reports that she does not drink alcohol or use  drugs.  Allergies:  Allergies  Allergen Reactions  . Lisinopril Cough, Nausea And Vomiting and Other (See Comments)    ACE Inhibitor Cough ?Cough - changed to ARB 02/2013. ACE Inhibitor Cough ACE Inhibitor Cough ?Cough - changed to ARB 02/2013. ACE Inhibitor Cough ?Cough - changed to ARB 02/2013.  [name] Other Hives    Unknown IV antibiotic  . Tramadol     Causes respiratory distress    Medications: I have reviewed the patient's current medications.  Results for orders placed or performed during the hospital encounter of 12/14/18 (from the past 48 hour(s))  Basic metabolic panel     Status: Abnormal   Collection Time: 12/14/18  9:12 AM  Result Value Ref Range   Sodium 139 135 - 145 mmol/L   Potassium 4.0 3.5 - 5.1 mmol/L   Chloride 98 98 - 111 mmol/L   CO2 25 22 - 32 mmol/L   Glucose, Bld 109 (H) 70 - 99 mg/dL   BUN 32 (H) 8 - 23 mg/dL   Creatinine, Ser 1.61 (H) 0.44 - 1.00 mg/dL   Calcium 9.6 8.9 - 09.6 mg/dL   GFR calc non Af Amer 29 (L) >60 mL/min   GFR calc Af Amer 34 (L) >60 mL/min   Anion gap 16 (H) 5 - 15    Comment: Performed at [facility], [address] [place], [place] [address]  Lactic acid, plasma     Status: None   Collection Time: 12/14/18  9:12 AM  Result Value Ref Range   Lactic Acid, Venous 1.9 0.5 - 1.9 mmol/L    Comment: Performed at [facility], [address] [place], [place] [address]  CBC with Differential     Status: Abnormal   Collection Time: 12/14/18  9:12 AM  Result Value Ref Range   WBC 13.3 (H) 4.0 - 10.5 K/uL   RBC 3.94 3.87 - 5.11 MIL/uL   Hemoglobin 11.6 (L) 12.0 - 15.0 g/dL   HCT 14.7 82.9 - 56.2 %   MCV 93.7 80.0 - 100.0 fL   MCH 29.4 26.0 - 34.0 pg   MCHC 31.4 30.0 - 36.0 g/dL   RDW 13.0 86.5 - 78.4 %   Platelets 246 150 - 400 K/uL   nRBC 0.0 0.0 - 0.2 %   Neutrophils Relative % 87 %   Neutro Abs 11.4 (H) 1.7 - 7.7 K/uL   Lymphocytes Relative 9 %   Lymphs Abs 1.3 0.7 - 4.0 K/uL   Monocytes Relative 4 %    Monocytes  Absolute 0.5 0.1 - 1.0 K/uL   Eosinophils Relative 0 %   Eosinophils Absolute 0.0 0.0 - 0.5 K/uL   Basophils Relative 0 %   Basophils Absolute 0.1 0.0 - 0.1 K/uL   Immature Granulocytes 0 %   Abs Immature Granulocytes 0.05 0.00 - 0.07 K/uL    Comment: Performed at [facility], [address], [place], [place] [address]    Dg Foot Complete Right  Result Date: 12/14/2018 CLINICAL DATA:  Diabetic patient right foot pain.  No known injury. EXAM: RIGHT FOOT COMPLETE - 3+ VIEW COMPARISON:  None. FINDINGS: No acute bony or joint abnormality is identified. There is partial visualization of fixation of a distal fibular fracture and syndesmotic stabilization. Fracture lines are visible. A linear radiopaque foreign body most consistent with a needle is seen in the plantar soft tissues of the foot and measures 1.2 cm in length. The needle is positioned along the plantar surface of the distal diaphysis of the second metatarsal. No soft tissue gas, bony destructive change or periosteal reaction is identified. Soft tissues about the ankle and proximal foot appear swollen. IMPRESSION: Negative for acute bony abnormality. Soft tissue swelling about the proximal foot and ankle. Metallic foreign body along the plantar surface of the second metatarsal is most consistent with a needle measuring 1.2 cm. Status post fixation of a distal fibular fracture and stabilization of the syndesmosis. Electronically Signed   By: Drusilla Kanner M.D.   On: 12/14/2018 09:45    Review of Systems  Constitutional: Positive for chills. Negative for fever and weight loss.  HENT: Negative for ear discharge, ear pain, hearing loss and tinnitus.   Eyes: Negative for blurred vision, double vision, photophobia and pain.  Respiratory: Negative for cough, sputum production and shortness of breath.   Cardiovascular: Negative for chest pain.  Gastrointestinal: Negative for abdominal pain, nausea and vomiting.  Genitourinary:  Negative for dysuria, flank pain, frequency and urgency.  Musculoskeletal: Negative for back pain, falls, joint pain, myalgias and neck pain.  Neurological: Negative for dizziness, tingling, sensory change, focal weakness, loss of consciousness and headaches.  Endo/Heme/Allergies: Does not bruise/bleed easily.  Psychiatric/Behavioral: Negative for depression, memory loss and substance abuse. The patient is not nervous/anxious.    Blood pressure (!) 107/56, pulse 82, temperature 97.8 F (36.6 C), temperature source Oral, resp. rate 19, height 5\' 3"  (1.6 m), weight (!) 145 kg, SpO2 98 %. Physical Exam  Constitutional: She appears well-developed and well-nourished. No distress.  HENT:  Head: Normocephalic and atraumatic.  Eyes: Conjunctivae are normal. Right eye exhibits no discharge. Left eye exhibits no discharge. No scleral icterus.  Neck: Normal range of motion.  Cardiovascular: Normal rate and regular rhythm.  Respiratory: Effort normal. No respiratory distress.  Musculoskeletal:     Comments: RLE No traumatic wounds, ecchymosis, or rash  Healing ulceration over lateral surgical site, heel decub with black eschar, no TTP, no surrounding erythema, no discharge, no odor  No knee or ankle effusion  Knee stable to varus/ valgus and anterior/posterior stress  Sens DPN, SPN, TN intact  Motor EHL, ext, flex, evers 5/5  DP 1+, PT 0, No significant edema  Neurological: She is alert.  Skin: Skin is warm and dry. She is not diaphoretic.  Psychiatric: She has a normal mood and affect. Her behavior is normal.    Assessment/Plan: S/p right ankle ORIF with wound infection Right heel decubitus ulcer ----- I don't think either of these is the cause for her current symptoms.  They appear to be healing appropriately. As long as her MRSA coverage continues in the hospital while her perirectal is being treated this should be adequate for her foot. She should f/u with her operating orthopedic surgeon in  [place] upon discharge. FB right foot -- Incidental finding, asymptomatic, does not need f/u Perirectal abscess Multiple medical problems including CAD, CHF, HTN, HLD, DM, and asthma -- per primary service    Freeman Caldron, PA-C Orthopedic Surgery [phone] 12/14/2018, 11:01 AM

## 2018-12-14 NOTE — Progress Notes (Signed)
Felicia Rivas is a 63 y.o. female patient admitted from ED awake, alert - oriented  X 4 - no acute distress noted.  VSS - Blood pressure (!) 100/56, pulse 75, temperature 98.4 F (36.9 C), temperature source Oral, resp. rate 18, height 5\' 6"  (1.676 m), weight (!) 141.2 kg, SpO2 100 %.  IV in place, occlusive dsg intact without redness.  Pt AOX4. Orientation to room, and floor completed with information packet given to patient/family. Admission INP armband ID verified with patient and in place. Fall assessment complete, with patient able to verbalize understanding of risk associated with falls, and verbalized understanding to call nsg before up out of bed. Call light within reach, patient able to voice, and demonstrate understanding. Pt is in stable condition.   Will cont to eval and treat per MD orders.  Campbell Riches, RN 12/14/2018 2:52 PM

## 2018-12-14 NOTE — ED Triage Notes (Signed)
Pt arrived POV with complaints of issues with her foot. Went to wound care yesterday and had issues finding a pulse. Pt denies pain at this time. Pt endorces having chills this morning.

## 2018-12-14 NOTE — ED Provider Notes (Addendum)
[facility] EMERGENCY DEPARTMENT Provider Note   CSN: [id number] Arrival date & time: 12/14/18  4098    History   Chief Complaint Chief Complaint  Patient presents with   Wound Check    HPI Felicia Rivas is a 63 y.o. female.     HPI Patient has surgical repair of right tib-fib fracture with ORIF [id number].  This was done by Dr. Hyman Hopes.  Patient has had difficulty with wound healing since repair.  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  Patient reports she has been on Bactrim almost since the procedure.  She was not seeing any improvement until referred to wound care.  She has had 2 treatment sessions at wound care with debridement.  She reports since starting that she is now seeing improvement.  She denies any worsening pain in her foot.  She reports her great toe is chronically numb.  She does not have new numbness.  She was scheduled to see vascular surgery today but came to the emergency department because yesterday evening she developed chills and her daughter reported she seemed somewhat confused "talking out of her head".  They did not measure an elevated temperature at home.  Patient is concerned that the Bactrim is not effective for her infection because she has been taking it for so long now.  In addition to the lateral ankle surgical wound, patient reports that she developed a pressure sore on her heel from her walking boot.  She reports that that pressure sore is actually at least 50% better than it was previously.  Patient later added during evaluation that she has had a tender area in her perianal or buttock region.  She reported that she tends to get little abscesses that burst on her own.  She reports that she had her daughter check it and her daughter did not see any problems and did not think there was anything going on in that area.  Patient reports that it has been sore for about a week.  After x-ray  returned noting a suspected small needle in her right foot, patient was questioned about this.  She reports she has never been aware of this and has not noted Past Medical History:  Diagnosis Date   Asthma    Asthma    CAD (coronary artery disease)    a. NSTEMI 12/12 -> s/p CABGx3.    Carotid artery disease (HCC)    a. Carotid US (08/2013): R 60-79%; L 40-59%; f/u 6 mos.   Carotid stenosis    a. Carotid US (08/2013):  R 60-79%; L 40-59%; f/u 6 mos   Chronic diastolic CHF (congestive heart failure) (HCC)    a. Echo 08/2011: mod LVH, nl EF. b. Echo 01/2013: EF 55-60%, mod-sev LVH, mod-sev LA dilitation   Chronic diastolic heart failure (HCC)    a. Echo 08/2011: mod LVH, nl EF. b. Echo 01/2013: EF 55-60%, mod-sev LVH, mod-sev LA dilitation.   Complication of anesthesia    '' Myself & my family wake up during surgery "   Complication of anesthesia    Coronary artery disease    a. NSTEMI 12/12 -> s/p CABGx3.   Cough    Cough    a. 2014: Suspect a combination of CHF, GERD and ACE inhibitor-induced cough.   Diabetes mellitus    Diabetes mellitus (HCC)    History of chicken pox    HLD (hyperlipidemia)    HTN (hypertension)  Hyperlipidemia    Hypertension    Morbid obesity (HCC)    Myocardial infarction (HCC)    PONV (postoperative nausea and vomiting)    S/P CABG (coronary artery bypass graft)    a. 2012 - Dr. Laneta SimmersBartle;  LIMA-LAD, SVG-OM 2, SVG-PDA.   S/P CABG x 3 09/07/11   Dr. Laneta SimmersBartle;   LIMA-LAD, SVG-OM 2, SVG-PDA.   Sleep disturbance    a. Had sleep study several years ago, wasn't told she has OSA but told she needs O2 at night.   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) 03/19/2018   Degeneration of lumbar intervertebral disc 02/08/2018   Weakness of left leg 01/19/2018   Vitamin D deficiency 09/22/2016   Past history of myocardial infarction 04/18/2016   Diabetic retinopathy associated with type 2 diabetes mellitus (HCC) 11/19/2015   Long-term insulin use (HCC) 11/19/2015   Acute on chronic diastolic congestive heart failure (HCC) 11/14/2014   ACE-inhibitor cough 11/10/2014   History of asthma 11/10/2014   Carotid artery disease (HCC) 11/10/2014   Cough 11/10/2014   LVH (left ventricular hypertrophy) 11/10/2014   Shortness of breath 11/10/2014   Hypotension 11/10/2014   Renal insufficiency 11/10/2014   Lightheadedness 11/10/2014   Bronchitis, acute 11/10/2014   Carotid stenosis    Pain in joint, ankle and foot 04/05/2013   Mild intermittent asthma without complication 02/23/2013   Preventative health care 11/26/2011   Hypertension 11/25/2011   Incisional infection 11/03/2011   Retinopathy 10/18/2011   Hx of CABG 2012    Chronic diastolic heart failure (HCC)    HLD (hyperlipidemia)    Morbid obesity (HCC) 09/01/2011   Asthma 08/31/2011   DM2 (diabetes mellitus, type 2) (HCC) 08/31/2011    Past Surgical History:  Procedure Laterality Date   CAROTID ANGIOGRAPHY N/A 03/17/2017   Procedure: Carotid Angiography;  Surgeon: Sherren KernsFields, Charles E, MD;  [facility];  Service: Cardiovascular;  Laterality: N/A;   CAROTID PTA/STENT INTERVENTION Right 03/29/2017   Procedure: Carotid PTA/Stent Intervention;  Surgeon: Sherren KernsFields, Charles E, MD;  Location: [facility] INVASIVE CV LAB;  Service: Cardiovascular;   Laterality: Right;   CHOLECYSTECTOMY     CHOLECYSTECTOMY  1990's   CORONARY ARTERY BYPASS GRAFT     CORONARY ARTERY BYPASS GRAFT  09/07/2011   CABG X3; Procedure: CORONARY ARTERY BYPASS GRAFTING (CABG);  Surgeon: Alleen BorneBryan K Bartle, MD;  [facility];  Service: Open Heart Surgery;  Laterality: N/A;   LEFT HEART CATHETERIZATION WITH CORONARY ANGIOGRAM N/A 09/02/2011   Procedure: LEFT HEART CATHETERIZATION WITH CORONARY ANGIOGRAM;  Surgeon: Herby Abrahamhomas D Stuckey, MD;  [facility];  Service: Cardiovascular;  Laterality: N/A;   TUBAL LIGATION  1981     OB History    Gravida  0   Para  0   Term  0   Preterm  0   AB  0  Living        SAB  0   TAB  0   Ectopic  0   Multiple      Live Births               Home Medications    Prior to Admission medications   Medication Sig Start Date End Date Taking? Authorizing Provider  albuterol (PROVENTIL HFA;VENTOLIN HFA) 108 (90 BASE) MCG/ACT inhaler Inhale 2 puffs into the lungs every 6 (six) hours as needed for wheezing or shortness of breath. 11/11/14  Yes Dunn, Tacey Ruiz, PA-C  aspirin EC 81 MG tablet Take 1 tablet (81 mg total) by mouth daily. 07/17/18  Yes Kilroy, Luke K, PA-C  atorvastatin (LIPITOR) 80 MG tablet Take 1 tablet (80 mg total) by mouth daily. 07/17/18  Yes Kilroy, Luke K, PA-C  budesonide-formoterol (SYMBICORT) 160-4.5 MCG/ACT inhaler Inhale 2 puffs into the lungs every 12 (twelve) hours. 10/22/18  Yes [provider]  clopidogrel (PLAVIX) 75 MG tablet Take 1 tablet (75 mg total) by mouth daily. 12/12/17  Yes Nickel, Carma Lair, NP  furosemide (LASIX) 40 MG tablet Take 3 tablets (120 mg total) by mouth 2 (two) times daily. TAKE ADDITIONAL  AT NOON X2 DAYS Patient taking differently: Take 120 mg by mouth 2 (two) times daily.  12/05/18  Yes Jodelle Gross, NP  gabapentin (NEURONTIN) 300 MG capsule Take 600 mg by mouth 3 (three) times daily.    Yes [provider]  insulin regular human  CONCENTRATED (HUMULIN R) 500 UNIT/ML injection Inject 12-20 Units into the skin See admin instructions. Inject 20 units in the morning (8AM) and 12 units at night (10PM) with additional as needed. *Max daily dosage 65units/day 02/20/17  Yes [provider]  Liraglutide 18 MG/3ML SOPN Inject 1.8 mg into the skin daily.   Yes [provider]  metoprolol succinate (TOPROL-XL) 25 MG 24 hr tablet Take 1 tablet (25 mg total) by mouth daily. 07/17/18  Yes Lewayne Bunting, MD  mupirocin cream (BACTROBAN) 2 % Apply 1 application topically 3 (three) times daily. Wound care 12/05/18 12/05/19 Yes [provider]  potassium chloride SA (K-DUR,KLOR-CON) 20 MEQ tablet Take 2 tablets (40 mEq total) by mouth daily. 09/07/18  Yes Lewayne Bunting, MD  sulfamethoxazole-trimethoprim (BACTRIM DS,SEPTRA DS) 800-160 MG tablet Take 1 tablet by mouth 2 (two) times daily. 12/03/18 12/17/18 Yes [provider]  Vitamin D, Ergocalciferol, (DRISDOL) 1.25 MG (50000 UT) CAPS capsule Take 50,000 Units by mouth every Sunday. 11/09/18  Yes [provider]  glucose blood (FREESTYLE LITE) test strip Check bg 3 times/day 11/19/15   [provider]  glucose blood (PRECISION QID TEST) test strip Check bg 3 times/day 11/19/15   [provider]  glucose blood test strip Check bg 3 times/day 11/19/15   [provider]    Family History Family History  Problem Relation Age of Onset   Cancer Mother    Heart failure Mother        Also has hx MVP s/p MVR   Diabetes Mother    Coronary artery disease Father        Died of MI at age 11   Heart disease Other        Parent    Social History Social History   Tobacco Use   Smoking status: Former Smoker    Packs/day: 0.50    Years: 25.00    Pack years: 12.50    Types: Cigarettes  Last attempt to quit: 11/11/2011    Years since quitting: 7.0   Smokeless tobacco: Former Neurosurgeon  Substance Use Topics   Alcohol use: No    Drug use: No     Allergies   Lisinopril; Other; and Tramadol   Review of Systems Review of Systems 10 Systems reviewed and are negative for acute change except as noted in the HPI.   Physical Exam Updated Vital Signs BP (!) 110/59    Pulse 71    Temp 97.8 F (36.6 C) (Oral)    Resp 16    Ht 5\' 3"  (1.6 m)    Wt (!) 145 kg    SpO2 93%    BMI 56.63 kg/m   Physical Exam Constitutional:      Comments: Alert, nontoxic, no respiratory distress, obesity.  HENT:     Head: Normocephalic and atraumatic.  Eyes:     Extraocular Movements: Extraocular movements intact.  Cardiovascular:     Rate and Rhythm: Normal rate and regular rhythm.  Pulmonary:     Effort: Pulmonary effort is normal.     Breath sounds: Normal breath sounds.  Abdominal:     Palpations: Abdomen is soft.     Tenderness: There is no abdominal tenderness.  Genitourinary:    Comments: Left perianal region has a approximately 2 and half centimeter firm, induration that is tender.  I cannot appreciate fluctuance.  Visually this is not remarkable but on palpation is notable.  Patient has several moderately large but nonthrombosed hemorrhoids. Musculoskeletal:     Comments: 1+ swelling of the right lower leg.  Lateral lower leg wound with fibrinous material and granulation tissue.  Dry black eschar on heel from prior pressure wound.  Pulses are present by hand-held Doppler bilateral feet.  Both feet are warm to the touch.  Brisk cap refill.  See attached images.  Skin:    General: Skin is warm and dry.  Neurological:     General: No focal deficit present.     Mental Status: She is oriented to person, place, and time.     Coordination: Coordination normal.  Psychiatric:        Mood and Affect: Mood normal.      ED Treatments / Results  Labs       (all labs ordered are listed, but only abnormal results are displayed) Labs Reviewed  BASIC METABOLIC PANEL - Abnormal; Notable for the following components:       Result Value   Glucose, Bld 109 (*)    BUN 32 (*)    Creatinine, Ser 1.81 (*)    GFR calc non Af Amer 29 (*)    GFR calc Af Amer 34 (*)    Anion gap 16 (*)    All other components within normal limits  CBC WITH DIFFERENTIAL/PLATELET - Abnormal; Notable for the following components:   WBC 13.3 (*)    Hemoglobin 11.6 (*)    Neutro Abs 11.4 (*)    All other components within normal limits  CULTURE, BLOOD (ROUTINE X 2)  CULTURE, BLOOD (ROUTINE X 2)  URINE CULTURE  LACTIC ACID, PLASMA  URINALYSIS, ROUTINE W REFLEX MICROSCOPIC    EKG None  Radiology Ct Pelvis Wo Contrast  Result Date: 12/14/2018 CLINICAL DATA:  Pt began having night sweats last night,chills; has a non-healing fx. of RIGHT foot . Pt also had pain in her LEFT buttock; pt states that she has had "boils" in the past, and that "they will usually burst and  clear up on their own EXAM: CT PELVIS WITHOUT CONTRAST TECHNIQUE: Multidetector CT imaging of the pelvis was performed following the standard protocol without intravenous contrast. COMPARISON:  None. FINDINGS: Urinary Tract:  No abnormality visualized. Bowel:  Unremarkable visualized pelvic bowel loops. Vascular/Lymphatic: Normal caliber abdominal aorta with atherosclerosis. 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 Musculoskeletal: No acute osseous abnormality. No aggressive osseous lesion. Degenerative disc disease with disc height loss at L4-5 and L5-S1. Bilateral foraminal stenosis at L5-S1. IMPRESSION: 1. 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 2.  Aortic Atherosclerosis (ICD10-I70.0). Electronically  Signed   By: Elige Ko   On: 12/14/2018 11:42   Dg Foot Complete Right  Result Date: 12/14/2018 CLINICAL DATA:  Diabetic patient right foot pain.  No known injury. EXAM: RIGHT FOOT COMPLETE - 3+ VIEW COMPARISON:  None. FINDINGS: No acute bony or joint abnormality is identified. There is partial visualization of fixation of a distal fibular fracture and syndesmotic stabilization. Fracture lines are visible. A linear radiopaque foreign body most consistent with a needle is seen in the plantar soft tissues of the foot and measures 1.2 cm in length. The needle is positioned along the plantar surface of the distal diaphysis of the second metatarsal. No soft tissue gas, bony destructive change or periosteal reaction is identified. Soft tissues about the ankle and proximal foot appear swollen. IMPRESSION: Negative for acute bony abnormality. Soft tissue swelling about the proximal foot and ankle. Metallic foreign body along the plantar surface of the second metatarsal is most consistent with a needle measuring 1.2 cm. Status post fixation of a distal fibular fracture and stabilization of the syndesmosis. Electronically Signed   By: Drusilla Kanner M.D.   On: 12/14/2018 09:45    Procedures .Marland KitchenIncision and Drainage Date/Time: 12/14/2018 1:49 PM Performed by: Arby Barrette, MD Authorized by: Arby Barrette, MD   Consent:    Consent obtained:  Verbal   Consent given by:  Patient   Risks discussed:  Bleeding, incomplete drainage, pain and infection Location:    Type:  Abscess   Location:  Anogenital   Anogenital location:  Perirectal Pre-procedure details:    Skin preparation:  Betadine Anesthesia (see MAR for exact dosages):    Anesthesia method:  Local infiltration   Local anesthetic:  Lidocaine 2% WITH epi Procedure type:    Complexity:  Complex Procedure details:    Incision types:  Single straight   Incision depth:  Subcutaneous   Scalpel blade:  11   Wound management:  Probed and  deloculated   Drainage:  Purulent and bloody   Drainage amount:  Moderate   Wound treatment:  Wound left open   Packing materials:  None Post-procedure details:    Patient tolerance of procedure:  Tolerated well, no immediate complications   (including critical care time)  Medications Ordered in ED Medications  metroNIDAZOLE (FLAGYL) IVPB 500 mg (500 mg Intravenous New Bag/Given 12/14/18 1130)  piperacillin-tazobactam (ZOSYN) IVPB 3.375 g (has no administration in time range)  vancomycin (VANCOCIN) 2,500 mg in sodium chloride 0.9 % 500 mL IVPB (has no administration in time range)  vancomycin (VANCOCIN) 1,500 mg in sodium chloride 0.9 % 500 mL IVPB (has no administration  in time range)     Initial Impression / Assessment and Plan / ED Course  I have reviewed the triage vital signs and the nursing notes.  Pertinent labs & imaging results that were available during my care of the patient were reviewed by me and considered in my medical decision making (see chart for details).  Clinical Course as of Dec 13 1216  Fri Dec 14, 2018  1042 Ortho consult: PA-C Earney Hamburg will see the patient in the emergency department.   [MP]  1126 Consult: Orthopedics advised to float the heal and protect against any ongoing pressure wit ongoing local wound care for fibrinous ankle wound.   [MP]  1214 Consult: General surgery will evaluate the patient regarding the finding of suspected rectal abscess with fistula on CT scan.   [MP]  1218 Consult: Internal medicine teaching service for admission.   [MP]    Clinical Course User Index [MP] Arby Barrette, MD  Consult: General surgery has reviewed CT and advises that the rectal abscess is fairly superficial and requests incision and drainage at bedside by EDP.  This has been done as per procedure note.  Deloculated and moderately large amount of purulent material obtained.    Patient presentation concerning for early sepsis.  She experienced  prolonged episode of shaking chills and sweating this morning.  Her daughter noted her to be confused although the patient reports she does not believe that she was confused.  Patient is not showing signs of encephalopathy at this time.  He has 2 potential sources for systemic infection or bacteremia.  Consultation has been placed to general surgery to address the perianal abscess and orthopedics for management of the patient's surgical, chronic lower extremity wound.  During her evaluation in the emergency department, patient is alert and appropriate with a clear mental status and no respiratory distress.  Final Clinical Impressions(s) / ED Diagnoses   Final diagnoses:  Wound infection  Perianal abscess  Severe comorbid illness  AKI (acute kidney injury) [facility])    ED Discharge Orders    None       Arby Barrette, MD 12/14/18 1220    Arby Barrette, MD 12/14/18 1351

## 2018-12-14 NOTE — ED Notes (Signed)
Surgical at bedside

## 2018-12-14 NOTE — H&P (Addendum)
Date: 12/14/2018               Patient Name:  Felicia Rivas MRN: [id number]  DOB: 28-May-1956 Age / Sex: 63 y.o., female   PCP: [name], MD         Medical Service: Internal Medicine Teaching Service         Attending Physician: Dr. Earl Lagos, MD    First Contact: Dr. Avie Arenas Pager: [phone]  Second Contact: Dr. Crista Elliot Pager: [phone]       After Hours (After 5p/  First Contact Pager: [phone]  weekends / holidays): Second Contact Pager: [phone]   Chief Complaint: diabetic foot ulcers  History of Present Illness: Felicia Rivas is a 63 yo female with a medical history of CAD s/p CABG 2012, TIA, carotid artery stenosis s/p stent, HTN, HLD, DMII, diastolic heart failure, and asthma who presented to the ED with chills and sweating overnight in the setting of right foot ulcers. Felicia Rivas fell down stairs earlier this year and fractured her right ankle. She underwent ORIF 10/2018 and has since had poor healing. She has an open wound superior to the right lateral malleolus and a right heel wound with eschar from the boot she wore post-op. She states she has been on Bactrim since the procedure. She reports she has been following with wound care and she recently had a wound culture positive for MRSA.      Felicia. Rivas also endorses a "boil" on her buttock that has been present for about one week and has been painful for the past two days. She denies drainage, painful bowel movements, and hematochezia. She states she has had lesions like this in the past in the same area. She has required I&D and antibiotics for these areas in the past. Her last one was over a year ago. She endorses chronic constipation and she only stools once weekly. She denies fevers, chest pain, abdominal pain, nausea, vomiting, diarrhea.  Since her surgery, she has been in a wheelchair and she has noticed some deconditioning. She also recently had her lasix dose increased as she had swelling in her legs and  orthopnea. Her symptoms have improved since the dose increase.   Upon arrival to the ED, pt was afebrile and hemodynamically stable. Labs notable for leukocytosis of 13.3, hemoglobin 11.6, Cr 1.81 (BL is 1), lactate wnl. 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 Right foot xray showed soft tissue swelling. She received vanc, zosyn, and flagyl in the ED.  Meds:  Current Meds  Medication Sig  . albuterol (PROVENTIL HFA;VENTOLIN HFA) 108 (90 BASE) MCG/ACT inhaler Inhale 2 puffs into the lungs every 6 (six) hours as needed for wheezing or shortness of breath.  [name] aspirin EC 81 MG tablet Take 1 tablet (81 mg total) by mouth daily.  [name] atorvastatin (LIPITOR) 80 MG tablet Take 1 tablet (80 mg total) by mouth daily.  . budesonide-formoterol (SYMBICORT) 160-4.5 MCG/ACT inhaler Inhale 2 puffs into the lungs every 12 (twelve) hours.  . clopidogrel (PLAVIX) 75 MG tablet Take 1 tablet (75 mg total) by mouth daily.  . furosemide (LASIX) 40 MG tablet Take 3 tablets (120 mg total) by mouth 2 (two) times daily. TAKE ADDITIONAL  AT NOON X2 DAYS (Patient taking differently: Take 120 mg by mouth 2 (two) times daily. )  . gabapentin (NEURONTIN) 300 MG capsule Take 600 mg by mouth 3 (three) times daily.   . insulin regular human CONCENTRATED (HUMULIN R) 500 UNIT/ML  injection Inject 12-20 Units into the skin See admin instructions. Inject 20 units in the morning (8AM) and 12 units at night (10PM) with additional as needed. *Max daily dosage 65units/day  . Liraglutide 18 MG/3ML SOPN Inject 1.8 mg into the skin daily.  . metoprolol succinate (TOPROL-XL) 25 MG 24 hr tablet Take 1 tablet (25 mg total) by mouth daily.  . mupirocin cream (BACTROBAN) 2 % Apply 1 application topically 3 (three) times daily. Wound care  . potassium chloride SA (K-DUR,KLOR-CON) 20 MEQ tablet Take 2 tablets (40 mEq total) by mouth daily.  [name] sulfamethoxazole-trimethoprim (BACTRIM DS,SEPTRA DS) 800-160 MG tablet Take 1 tablet by mouth 2  (two) times daily.  . Vitamin D, Ergocalciferol, (DRISDOL) 1.25 MG (50000 UT) CAPS capsule Take 50,000 Units by mouth every Sunday.   Allergies: Allergies as of 12/14/2018 - Review Complete 12/14/2018  Allergen Reaction Noted  . Lisinopril Cough, Nausea And Vomiting, and Other (See Comments) 02/19/2013  . Other Hives 11/10/2014  . Tramadol  11/22/2018   Past Medical History:  Diagnosis Date  . Asthma   . Asthma   . CAD (coronary artery disease)    a. NSTEMI 12/12 -> s/p CABGx3.   . Carotid artery disease (HCC)    a. Carotid US (08/2013): R 60-79%; L 40-59%; f/u 6 mos.  . Carotid stenosis    a. Carotid US (08/2013):  R 60-79%; L 40-59%; f/u 6 mos  . Chronic diastolic CHF (congestive heart failure) (HCC)    a. Echo 08/2011: mod LVH, nl EF. b. Echo 01/2013: EF 55-60%, mod-sev LVH, mod-sev LA dilitation  . Chronic diastolic heart failure (HCC)    a. Echo 08/2011: mod LVH, nl EF. b. Echo 01/2013: EF 55-60%, mod-sev LVH, mod-sev LA dilitation.  . Complication of anesthesia    '' Myself & my family wake up during surgery "  . Complication of anesthesia   . Coronary artery disease    a. NSTEMI 12/12 -> s/p CABGx3.  [name] Cough   . Cough    a. 2014: Suspect a combination of CHF, GERD and ACE inhibitor-induced cough.  . Diabetes mellitus   . Diabetes mellitus (HCC)   . History of chicken pox   . HLD (hyperlipidemia)   . HTN (hypertension)   . Hyperlipidemia   . Hypertension   . Morbid obesity (HCC)   . Myocardial infarction (HCC)   . PONV (postoperative nausea and vomiting)   . S/P CABG (coronary artery bypass graft)    a. 2012 - Dr. Laneta Simmers;  LIMA-LAD, SVG-OM 2, SVG-PDA.  . S/P CABG x 3 09/07/11   Dr. Laneta Simmers;   LIMA-LAD, SVG-OM 2, SVG-PDA.  . Sleep disturbance    a. Had sleep study several years ago, wasn't told she has OSA but told she needs O2 at night.  . Sleep disturbance    Had sleep study several years ago, wasn't told she has OSA but told she needs O2 at night  . Stroke  (HCC)   . Stroke [facility]) 2006   denies residual  . TIA (transient ischemic attack)    Family History:  Family History  Problem Relation Age of Onset  . Cancer Mother   . Heart failure Mother        Also has hx MVP s/p MVR  . Diabetes Mother   . Coronary artery disease Father        Died of MI at age 38  . Heart disease Other        Parent  Social History:  Social History   Tobacco Use  . Smoking status: Former Smoker    Packs/day: 0.50    Years: 25.00    Pack years: 12.50    Types: Cigarettes    Last attempt to quit: 11/11/2011    Years since quitting: 7.0  . Smokeless tobacco: Former Engineer, water Use Topics  . Alcohol use: No  . Drug use: No   Review of Systems: A complete ROS was negative except as per HPI.   Physical Exam: Blood pressure 116/63, pulse 79, temperature 97.8 F (36.6 C), temperature source Oral, resp. rate 18, height 5\' 3"  (1.6 m), weight (!) 145 kg, SpO2 96 %.  Constitutional: Obese female, no distress, alert and oriented x3. Eyes: Pupils are equal, round, and reactive to light. EOM are normal.  Cardiovascular: Normal rate and regular rhythm. No murmurs, rubs, or gallops. Pulmonary/Chest: Effort normal. Clear to auscultation bilaterally. No wheezes, rales, or rhonchi.  Abdominal: Bowel sounds present. Soft, non-distended, non-tender. Skin: Warm and dry. There is a 3x3cm fluctuant and indurated region of the left perianal area with associated erythema and increased warmth. There is a quarter-sized ulcer superior to the right lateral malleolus with some surrounding erythema and edema. There is a larger ulcer with eschar on the right heel.  Right foot xray Negative for acute bony abnormality. Soft tissue swelling about the proximal foot and ankle. Metallic foreign body along the plantar surface of the second metatarsal is most consistent with a needle measuring 1.2 cm. Status post fixation of a distal fibular fracture and stabilization of the  syndesmosis.  CT abdomen pelvis 2.9 x 3.5 cm complex fluid collection with surrounding inflammatory changes in the left posteromedial perianal subcutaneous fat concerning for a perianal abscess with a left lateral perianal fistula.  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 In addition, she was found to have a left lateral perianal abscess.   Right foot ulcers - The patient was evaluated by orthopedic surgery who believes the wounds are healing appropriately. They recommend continued MRSA coverage and outpatient f/u with her orthopedic surgeon.  - The patient was also evaluated by wound care who recommends heel elevation, topical debridement, and dressing changes Plan - Continue wound care - Vancomycin  Perianal abscess - Afebrile. Leukocytosis of 13.3. Hemodynamically stable. Normal lactate. No systemic signs of infection although patient had chills and diaphoresis at home. She will likely require drainage of the abscess and ongoing antibiotic therapy. Plan - General surgery consulted, appreciate recs - F/u blood cultures - Vancomycin and augmentin  - Pain control: tylenol and home gabapentin 600mg  TID  AKI - Cr elevated to 1.8. Baseline is 1. Likely prerenal in the setting of infection or side effect of lasix increase. Pt endorses good PO intake.  Plan - Encourage PO intake. Will avoid IVF given history of heart failure. - UA  - Renal US - Trend Cr  CAD - Continue home aspirin, plavix, and statin   Diastolic heart failure - ECHO showed EF 45-50% three months ago. Plan - Continue home lasix 120mg   BID and metoprolol 25mg   daily  DMII - A1c 6.6 three months ago. Home regimen includes humulin SSI BID and Victoza. Plan - SSI  Asthma - Continue home dulera and albuterol prn  Dispo: Admit patient to Observation with expected length of stay less than 2 midnights.  Signed: Dionne Ano, MD 12/14/2018, 1:36 PM  Pager: Pager# [phone]

## 2018-12-14 NOTE — ED Notes (Signed)
Patient transported to X-ray 

## 2018-12-14 NOTE — Consult Note (Signed)
[facility] Surgery Consult Note  Felicia Rivas 10/11/1955  [id number].    Requesting MD: Dr. Arby Barrette Chief Complaint/Reason for Consult: Perianal abscess  HPI: Felicia Rivas is a 63 y.o. female with complex medical history included CAD s/p CABG 2012 on ASA and Plavix,HTN, HLD, DM2, CHF, and asthma who presented to the [facility] with chills and complaints of a right foot ulcer. During workup the patient reportedly also complained of a boil on her left buttocks, that has been present for ~1 week and been increasingly painful for the last 2-3 days. Associated symptoms include chills. She denies fever, N/V, abdominal pain, drainage, constipation, diarrhea, hematochezia or melena. She has been trying topical ointments for this without any relief. She notes she has had several abscesses in this area in the past that usually spontaneously drain on their own. She notes requiring an bedside I&D and antibiotics for a perianal abscess ~10 years ago. She has never had to go to the OR for this. No personal or family hx of IBD. A CT scan was obtained suggestive of a perianal abscess. General surgery was consulted.   To note patient is currently being treated with Bactrim for a nonhealing right heel decub from a splint she was wearing following an ORIF of her right ankle in 10/2018.   ROS: Review of Systems  Constitutional: Positive for chills. Negative for fever.  Respiratory: Positive for shortness of breath and wheezing. Negative for cough.   Cardiovascular: Positive for leg swelling. Negative for chest pain.  Gastrointestinal: Negative for abdominal pain, constipation, diarrhea, nausea and vomiting.  Genitourinary: Negative for dysuria, frequency, hematuria and urgency.       Perianal abscess  All other systems reviewed and are negative.   Family History  Problem Relation Age of Onset  . Cancer Mother   . Heart failure Mother        Also has hx MVP s/p MVR  . Diabetes Mother   . Coronary  artery disease Father        Died of MI at age 74  . Heart disease Other        Parent    Past Medical History:  Diagnosis Date  . Asthma   . Asthma   . CAD (coronary artery disease)    a. NSTEMI 12/12 -> s/p CABGx3.   . Carotid artery disease (HCC)    a. Carotid US (08/2013): R 60-79%; L 40-59%; f/u 6 mos.  . Carotid stenosis    a. Carotid US (08/2013):  R 60-79%; L 40-59%; f/u 6 mos  . Chronic diastolic CHF (congestive heart failure) (HCC)    a. Echo 08/2011: mod LVH, nl EF. b. Echo 01/2013: EF 55-60%, mod-sev LVH, mod-sev LA dilitation  . Chronic diastolic heart failure (HCC)    a. Echo 08/2011: mod LVH, nl EF. b. Echo 01/2013: EF 55-60%, mod-sev LVH, mod-sev LA dilitation.  . Complication of anesthesia    '' Myself & my family wake up during surgery "  . Complication of anesthesia   . Coronary artery disease    a. NSTEMI 12/12 -> s/p CABGx3.  [name] Cough   . Cough    a. 2014: Suspect a combination of CHF, GERD and ACE inhibitor-induced cough.  . Diabetes mellitus   . Diabetes mellitus (HCC)   . History of chicken pox   . HLD (hyperlipidemia)   . HTN (hypertension)   . Hyperlipidemia   . Hypertension   . Morbid obesity (HCC)   . Myocardial infarction (  HCC)   . PONV (postoperative nausea and vomiting)   . S/P CABG (coronary artery bypass graft)    a. 2012 - Dr. Laneta Simmers;  LIMA-LAD, SVG-OM 2, SVG-PDA.  . S/P CABG x 3 09/07/11   Dr. Laneta Simmers;   LIMA-LAD, SVG-OM 2, SVG-PDA.  . Sleep disturbance    a. Had sleep study several years ago, wasn't told she has OSA but told she needs O2 at night.  . Sleep disturbance    Had sleep study several years ago, wasn't told she has OSA but told she needs O2 at night  . Stroke (HCC)   . Stroke [facility]) 2006   denies residual  . TIA (transient ischemic attack)     Past Surgical History:  Procedure Laterality Date  . CAROTID ANGIOGRAPHY N/A 03/17/2017   Procedure: Carotid Angiography;  Surgeon: Sherren Kerns, MD;  Location: [facility] INVASIVE CV  LAB;  Service: Cardiovascular;  Laterality: N/A;  . CAROTID PTA/STENT INTERVENTION Right 03/29/2017   Procedure: Carotid PTA/Stent Intervention;  Surgeon: Sherren Kerns, MD;  Location: [facility] INVASIVE CV LAB;  Service: Cardiovascular;  Laterality: Right;  . CHOLECYSTECTOMY    . CHOLECYSTECTOMY  1990's  . CORONARY ARTERY BYPASS GRAFT    . CORONARY ARTERY BYPASS GRAFT  09/07/2011   CABG X3; Procedure: CORONARY ARTERY BYPASS GRAFTING (CABG);  Surgeon: Alleen Borne, MD;  Location: [facility] OR;  Service: Open Heart Surgery;  Laterality: N/A;  . LEFT HEART CATHETERIZATION WITH CORONARY ANGIOGRAM N/A 09/02/2011   Procedure: LEFT HEART CATHETERIZATION WITH CORONARY ANGIOGRAM;  Surgeon: Herby Abraham, MD;  Location: [facility] CATH LAB;  Service: Cardiovascular;  Laterality: N/A;  . TUBAL LIGATION  1981    Social History:  reports that she quit smoking about 7 years ago. Her smoking use included cigarettes. She has a 12.50 pack-year smoking history. She has quit using smokeless tobacco. She reports that she does not drink alcohol or use drugs.  Allergies:  Allergies  Allergen Reactions  . Lisinopril Cough, Nausea And Vomiting and Other (See Comments)    ACE Inhibitor Cough ?Cough - changed to ARB 02/2013. ACE Inhibitor Cough ACE Inhibitor Cough ?Cough - changed to ARB 02/2013. ACE Inhibitor Cough ?Cough - changed to ARB 02/2013.  [name] Other Hives    Unknown IV antibiotic  . Tramadol     Causes respiratory distress    (Not in a hospital admission)   Prior to Admission medications   Medication Sig Start Date End Date Taking? Authorizing Provider  albuterol (PROVENTIL HFA;VENTOLIN HFA) 108 (90 BASE) MCG/ACT inhaler Inhale 2 puffs into the lungs every 6 (six) hours as needed for wheezing or shortness of breath. 11/11/14  Yes Dunn, Tacey Ruiz, PA-C  aspirin EC 81 MG tablet Take 1 tablet (81 mg total) by mouth daily. 07/17/18  Yes Kilroy, Luke K, PA-C  atorvastatin (LIPITOR) 80 MG tablet Take 1 tablet (80 mg  total) by mouth daily. 07/17/18  Yes Kilroy, Luke K, PA-C  budesonide-formoterol (SYMBICORT) 160-4.5 MCG/ACT inhaler Inhale 2 puffs into the lungs every 12 (twelve) hours. 10/22/18  Yes [provider]  clopidogrel (PLAVIX) 75 MG tablet Take 1 tablet (75 mg total) by mouth daily. 12/12/17  Yes Nickel, Carma Lair, NP  furosemide (LASIX) 40 MG tablet Take 3 tablets (120 mg total) by mouth 2 (two) times daily. TAKE ADDITIONAL  AT NOON X2 DAYS Patient taking differently: Take 120 mg by mouth 2 (two) times daily.  12/05/18  Yes Jodelle Gross, NP  gabapentin (NEURONTIN) 300  MG capsule Take 600 mg by mouth 3 (three) times daily.    Yes [provider]  insulin regular human CONCENTRATED (HUMULIN R) 500 UNIT/ML injection Inject 12-20 Units into the skin See admin instructions. Inject 20 units in the morning (8AM) and 12 units at night (10PM) with additional as needed. *Max daily dosage 65units/day 02/20/17  Yes [provider]  Liraglutide 18 MG/3ML SOPN Inject 1.8 mg into the skin daily.   Yes [provider]  metoprolol succinate (TOPROL-XL) 25 MG 24 hr tablet Take 1 tablet (25 mg total) by mouth daily. 07/17/18  Yes Lewayne Bunting, MD  mupirocin cream (BACTROBAN) 2 % Apply 1 application topically 3 (three) times daily. Wound care 12/05/18 12/05/19 Yes [provider]  potassium chloride SA (K-DUR,KLOR-CON) 20 MEQ tablet Take 2 tablets (40 mEq total) by mouth daily. 09/07/18  Yes Lewayne Bunting, MD  sulfamethoxazole-trimethoprim (BACTRIM DS,SEPTRA DS) 800-160 MG tablet Take 1 tablet by mouth 2 (two) times daily. 12/03/18 12/17/18 Yes [provider]  Vitamin D, Ergocalciferol, (DRISDOL) 1.25 MG (50000 UT) CAPS capsule Take 50,000 Units by mouth every Sunday. 11/09/18  Yes [provider]  glucose blood (FREESTYLE LITE) test strip Check bg 3 times/day 11/19/15   [provider]  glucose blood (PRECISION QID TEST) test strip Check bg 3  times/day 11/19/15   [provider]  glucose blood test strip Check bg 3 times/day 11/19/15   [provider]    Blood pressure (!) 110/59, pulse 71, temperature 97.8 F (36.6 C), temperature source Oral, resp. rate 16, height 5\' 3"  (1.6 m), weight (!) 145 kg, SpO2 93 %. Physical Exam: General: Pleasant, obese, WD/WN white female who sitting up in bed in NAD HEENT: head is normocephalic, atraumatic.  Sclera are noninjected.  Pupils equal and round.  Ears and nose without any masses or lesions.  Mouth is pink and moist. Dentition fair Heart: Tachycardic rate, with regular rhythm.   Lungs: Patient sating 80-85% on RA. Labored breathing with short sentences. Noted expiratory wheezes throughout. Distant breath sounds.   Abd: Protuberant, soft, NT/ND, +BS, no masses, hernias, or organomegaly MS: moves all 4 extremities are symmetrical Skin: There is black eschar at the heel of right foot. Lower extremity with trace edema of the right compared to the left. There is surrounding erythema of the right lower extremity, extending to ~ mid shin. Skin otherwise dry with no masses, lesions, or rashes GU: The patient has a palpable area of induration ~3x4 cm of the left perianal with overlying blanchable erythema and heat. There appears to be an area of fluctuance. No drainage.  Psych: A&Ox3 with an appropriate affect. Neuro: cranial nerves grossly intact, extremity CSM intact bilaterally, normal speech  Results for orders placed or performed during the hospital encounter of 12/14/18 (from the past 48 hour(s))  Basic metabolic panel     Status: Abnormal   Collection Time: 12/14/18  9:12 AM  Result Value Ref Range   Sodium 139 135 - 145 mmol/L   Potassium 4.0 3.5 - 5.1 mmol/L   Chloride 98 98 - 111 mmol/L   CO2 25 22 - 32 mmol/L   Glucose, Bld 109 (H) 70 - 99 mg/dL   BUN 32 (H) 8 - 23 mg/dL   Creatinine, Ser 7.58 (H) 0.44 - 1.00 mg/dL   Calcium 9.6 8.9 - 83.2 mg/dL   GFR calc non Af Amer  29 (L) >60 mL/min   GFR calc Af Amer 34 (L) >60  mL/min   Anion gap 16 (H) 5 - 15    Comment: Performed at [facility], [address] [place], [place] [address]  Lactic acid, plasma     Status: None   Collection Time: 12/14/18  9:12 AM  Result Value Ref Range   Lactic Acid, Venous 1.9 0.5 - 1.9 mmol/L    Comment: Performed at [facility], [address] [place], [place] [address]  CBC with Differential     Status: Abnormal   Collection Time: 12/14/18  9:12 AM  Result Value Ref Range   WBC 13.3 (H) 4.0 - 10.5 K/uL   RBC 3.94 3.87 - 5.11 MIL/uL   Hemoglobin 11.6 (L) 12.0 - 15.0 g/dL   HCT 09.836.9 11.936.0 - 14.746.0 %   MCV 93.7 80.0 - 100.0 fL   MCH 29.4 26.0 - 34.0 pg   MCHC 31.4 30.0 - 36.0 g/dL   RDW 82.915.4 56.211.5 - 13.015.5 %   Platelets 246 150 - 400 K/uL   nRBC 0.0 0.0 - 0.2 %   Neutrophils Relative % 87 %   Neutro Abs 11.4 (H) 1.7 - 7.7 K/uL   Lymphocytes Relative 9 %   Lymphs Abs 1.3 0.7 - 4.0 K/uL   Monocytes Relative 4 %   Monocytes Absolute 0.5 0.1 - 1.0 K/uL   Eosinophils Relative 0 %   Eosinophils Absolute 0.0 0.0 - 0.5 K/uL   Basophils Relative 0 %   Basophils Absolute 0.1 0.0 - 0.1 K/uL   Immature Granulocytes 0 %   Abs Immature Granulocytes 0.05 0.00 - 0.07 K/uL    Comment: Performed at [facility], [address] [place], [place] [address]   Ct Pelvis Wo Contrast  Result Date: 12/14/2018 CLINICAL DATA:  Pt began having night sweats last night,chills; has a non-healing fx. of RIGHT foot . Pt also had pain in her LEFT buttock; pt states that she has had "boils" in the past, and that "they will usually burst and clear up on their own EXAM: CT PELVIS WITHOUT CONTRAST TECHNIQUE: Multidetector CT imaging of the pelvis was performed following the standard protocol without intravenous contrast. COMPARISON:  None. FINDINGS: Urinary Tract:  No abnormality visualized. Bowel:  Unremarkable visualized pelvic bowel loops. Vascular/Lymphatic: Normal caliber abdominal aorta  with atherosclerosis. 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 Musculoskeletal: No acute osseous abnormality. No aggressive osseous lesion. Degenerative disc disease with disc height loss at L4-5 and L5-S1. Bilateral foraminal stenosis at L5-S1. IMPRESSION: 1. 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 2.  Aortic Atherosclerosis (ICD10-I70.0). Electronically Signed   By: Elige KoHetal  Patel   On: 12/14/2018 11:42   Dg Foot Complete Right  Result Date: 12/14/2018 CLINICAL DATA:  Diabetic patient right foot pain.  No known injury. EXAM: RIGHT FOOT COMPLETE - 3+ VIEW COMPARISON:  None. FINDINGS: No acute bony or joint abnormality is identified. There is partial visualization of fixation of a distal fibular fracture and syndesmotic stabilization. Fracture lines are visible. A linear radiopaque foreign body most consistent with a needle is seen in the plantar soft tissues of the foot and measures 1.2 cm in length. The needle is positioned along the plantar  surface of the distal diaphysis of the second metatarsal. No soft tissue gas, bony destructive change or periosteal reaction is identified. Soft tissues about the ankle and proximal foot appear swollen. IMPRESSION: Negative for acute bony abnormality. Soft tissue swelling about the proximal foot and ankle. Metallic foreign body along the plantar surface of the second metatarsal is most consistent with a needle measuring 1.2 cm. Status post fixation of a distal fibular fracture and stabilization of the syndesmosis. Electronically Signed   By: Drusilla Kanner M.D.   On: 12/14/2018 09:45       Assessment/Plan CAD s/p CABG 2012 on ASA and Plavix HTN HLD DM2 CHF  Asthma on 2L of home oxygen Hypoxia   Perianal abscess - CT with 2.9 x 3.5 cm complex fluid collection with surrounding inflammatory changes in the left posteromedial perianal subcutaneous fat concerning for a perianal abscess with a left lateral perianal fistula. - Afebrile, WBC 13.3 - Agree with medical admission - Continue IV abx - Bedside I&D by EDP - Will follow for wound check tomorrow   Jacinto Halim, [facility] Surgery 12/14/2018, 12:52 PM Pager: [phone]

## 2018-12-14 NOTE — ED Notes (Signed)
Herbert Seta (daughter) [phone]

## 2018-12-14 NOTE — Consult Note (Signed)
WOC nurse consulted for wound care recommendations. Reviewed orthopedic note and images. Orders updated in the computer for wound care and offloading for the right heel.  Stable eschar on the right heel; paint with betadine, allow to air dry. Use pillows to elevate heel completely off the bed.   95% yellow left lateral medial malleolar wound, ortho has assessed and not concerned about surgical wounds. Will add enzymatic debridement ointment to clear slough/fibrinous tissue. Apply daily, cover with 2x2 moist with saline (over just the open wound). Top with dry dressing.    Re consult if needed, will not follow at this time. Thanks  Saifullah Jolley [employer], RN,CWOCN, CNS, CWON-AP [phone])

## 2018-12-14 NOTE — Progress Notes (Signed)
While assessing the patient's leg wound, the patient mentioned that her primary care physician had taken cultures of the wound and informed her that the cultures were positive for MRSA.  Patient has therefore been placed on contact precautions.

## 2018-12-14 NOTE — Progress Notes (Signed)
Pharmacy Antibiotic Note  Felicia Rivas is a 63 y.o. female admitted on 12/14/2018 with wound infection.  Pharmacy has been consulted for vancomycin dosing. Of note, patient has an ORIF in 10/17/18 with difficult wound healing since. 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 She has been on Bactrim since the procedure with minimal improvement. She has also received dose of Zosyn and flagyl for intra-abdominal infection. WBC 13.3. Afebrile. LA 1.9. SCr 1.81 (BL ~ 0.69)  Plan: -Vancomycin 2.5 gm IV load, then start Vancomycin 1500 mg IV Q 36 hrs. Goal AUC 400-550. Expected AUC: 525 SCr used: 1.81 -Monitor CBC, renal fx, cultures and clinical progress -Vanc peak/trough as necessary -F/u maintenance antibiotic dosing for intra abdominal infection    Height: 5\' 3"  (160 cm) Weight: (!) 319 lb 10.7 oz (145 kg) IBW/kg (Calculated) : 52.4  Temp (24hrs), Avg:97.8 F (36.6 C), Min:97.8 F (36.6 C), Max:97.8 F (36.6 C)  Recent Labs  Lab 12/14/18 0912  WBC 13.3*  CREATININE 1.81*  LATICACIDVEN 1.9    Estimated Creatinine Clearance: 44.9 mL/min (A) (by C-G formula based on SCr of 1.81 mg/dL (H)).    Allergies  Allergen Reactions  . Lisinopril Cough, Nausea And Vomiting and Other (See Comments)    ACE Inhibitor Cough ?Cough - changed to ARB 02/2013. ACE Inhibitor Cough ACE Inhibitor Cough ?Cough - changed to ARB 02/2013. ACE Inhibitor Cough ?Cough - changed to ARB 02/2013.  [name] Other Hives    Unknown IV antibiotic  . Tramadol     Causes respiratory distress    Antimicrobials this admission: Vancomycin 3/27 >>  Zosyn x 1 dose Flagyl x 1 dose   Dose adjustments this admission:   Microbiology results: 3/27 BCx:    Thank you for allowing pharmacy to be a part of this patient's care.  Vinnie Level, PharmD., BCPS Clinical Pharmacist Clinical phone for 12/14/18 until 3:30pm: [phone] If after 3:30pm, please refer to [facility] for unit-specific pharmacist

## 2018-12-15 DIAGNOSIS — I110 Hypertensive heart disease with heart failure: Secondary | ICD-10-CM

## 2018-12-15 DIAGNOSIS — E11628 Type 2 diabetes mellitus with other skin complications: Secondary | ICD-10-CM | POA: Diagnosis not present

## 2018-12-15 DIAGNOSIS — Z79899 Other long term (current) drug therapy: Secondary | ICD-10-CM

## 2018-12-15 DIAGNOSIS — Z8673 Personal history of transient ischemic attack (TIA), and cerebral infarction without residual deficits: Secondary | ICD-10-CM

## 2018-12-15 DIAGNOSIS — L089 Local infection of the skin and subcutaneous tissue, unspecified: Secondary | ICD-10-CM

## 2018-12-15 DIAGNOSIS — L89619 Pressure ulcer of right heel, unspecified stage: Secondary | ICD-10-CM | POA: Diagnosis present

## 2018-12-15 DIAGNOSIS — E785 Hyperlipidemia, unspecified: Secondary | ICD-10-CM

## 2018-12-15 DIAGNOSIS — Z885 Allergy status to narcotic agent status: Secondary | ICD-10-CM

## 2018-12-15 DIAGNOSIS — Z955 Presence of coronary angioplasty implant and graft: Secondary | ICD-10-CM

## 2018-12-15 DIAGNOSIS — E11621 Type 2 diabetes mellitus with foot ulcer: Secondary | ICD-10-CM

## 2018-12-15 DIAGNOSIS — K610 Anal abscess: Secondary | ICD-10-CM | POA: Diagnosis not present

## 2018-12-15 DIAGNOSIS — I6529 Occlusion and stenosis of unspecified carotid artery: Secondary | ICD-10-CM

## 2018-12-15 DIAGNOSIS — J45909 Unspecified asthma, uncomplicated: Secondary | ICD-10-CM

## 2018-12-15 DIAGNOSIS — I2510 Atherosclerotic heart disease of native coronary artery without angina pectoris: Secondary | ICD-10-CM

## 2018-12-15 DIAGNOSIS — Z888 Allergy status to other drugs, medicaments and biological substances status: Secondary | ICD-10-CM

## 2018-12-15 DIAGNOSIS — I5040 Unspecified combined systolic (congestive) and diastolic (congestive) heart failure: Secondary | ICD-10-CM

## 2018-12-15 DIAGNOSIS — Z7982 Long term (current) use of aspirin: Secondary | ICD-10-CM

## 2018-12-15 DIAGNOSIS — Z951 Presence of aortocoronary bypass graft: Secondary | ICD-10-CM

## 2018-12-15 DIAGNOSIS — B9562 Methicillin resistant Staphylococcus aureus infection as the cause of diseases classified elsewhere: Secondary | ICD-10-CM

## 2018-12-15 DIAGNOSIS — Z7902 Long term (current) use of antithrombotics/antiplatelets: Secondary | ICD-10-CM

## 2018-12-15 DIAGNOSIS — L97519 Non-pressure chronic ulcer of other part of right foot with unspecified severity: Secondary | ICD-10-CM

## 2018-12-15 DIAGNOSIS — R7989 Other specified abnormal findings of blood chemistry: Secondary | ICD-10-CM

## 2018-12-15 DIAGNOSIS — Z794 Long term (current) use of insulin: Secondary | ICD-10-CM

## 2018-12-15 LAB — CBC
HCT: 32.8 % — ABNORMAL LOW (ref 36.0–46.0)
Hemoglobin: 10.3 g/dL — ABNORMAL LOW (ref 12.0–15.0)
MCH: 29.5 pg (ref 26.0–34.0)
MCHC: 31.4 g/dL (ref 30.0–36.0)
MCV: 94 fL (ref 80.0–100.0)
Platelets: 209 10*3/uL (ref 150–400)
RBC: 3.49 MIL/uL — ABNORMAL LOW (ref 3.87–5.11)
RDW: 15.9 % — ABNORMAL HIGH (ref 11.5–15.5)
WBC: 12.2 10*3/uL — ABNORMAL HIGH (ref 4.0–10.5)
nRBC: 0 % (ref 0.0–0.2)

## 2018-12-15 LAB — COMPREHENSIVE METABOLIC PANEL
ALT: 18 U/L (ref 0–44)
AST: 19 U/L (ref 15–41)
Albumin: 3.1 g/dL — ABNORMAL LOW (ref 3.5–5.0)
Alkaline Phosphatase: 122 U/L (ref 38–126)
Anion gap: 11 (ref 5–15)
BUN: 33 mg/dL — ABNORMAL HIGH (ref 8–23)
CALCIUM: 9.2 mg/dL (ref 8.9–10.3)
CO2: 25 mmol/L (ref 22–32)
Chloride: 100 mmol/L (ref 98–111)
Creatinine, Ser: 1.8 mg/dL — ABNORMAL HIGH (ref 0.44–1.00)
GFR calc non Af Amer: 29 mL/min — ABNORMAL LOW (ref >60)
GFR, EST AFRICAN AMERICAN: 34 mL/min — AB (ref >60)
Glucose, Bld: 338 mg/dL — ABNORMAL HIGH (ref 70–99)
Potassium: 4.4 mmol/L (ref 3.5–5.1)
Sodium: 136 mmol/L (ref 135–145)
TOTAL PROTEIN: 6.6 g/dL (ref 6.5–8.1)
Total Bilirubin: 0.7 mg/dL (ref 0.3–1.2)

## 2018-12-15 LAB — HIV ANTIBODY (ROUTINE TESTING W REFLEX): HIV Screen 4th Generation wRfx: NONREACTIVE

## 2018-12-15 LAB — GLUCOSE, CAPILLARY
Glucose-Capillary: 261 mg/dL — ABNORMAL HIGH (ref 70–99)
Glucose-Capillary: 297 mg/dL — ABNORMAL HIGH (ref 70–99)

## 2018-12-15 MED ORDER — SODIUM CHLORIDE 0.9 % IV BOLUS
500.0000 mL | Freq: Once | INTRAVENOUS | Status: AC
  Administered 2018-12-15: 500 mL via INTRAVENOUS

## 2018-12-15 MED ORDER — AMOXICILLIN-POT CLAVULANATE 875-125 MG PO TABS: 1.0000 | ORAL_TABLET | Freq: Two times a day (BID) | ORAL | 0 refills | Status: AC

## 2018-12-15 MED ORDER — COLLAGENASE 250 UNIT/GM EX OINT: TOPICAL_OINTMENT | Freq: Every day | CUTANEOUS | 0 refills | Status: DC

## 2018-12-15 MED ORDER — DOXYCYCLINE HYCLATE 100 MG PO TABS: 100.0000 mg | ORAL_TABLET | Freq: Two times a day (BID) | ORAL | 0 refills | Status: DC

## 2018-12-15 NOTE — Progress Notes (Signed)
Subjective/Chief Complaint: No complaints, no pain perianal, had bm   Objective: Vital signs in last 24 hours: Temp:  [97.7 F (36.5 C)-98.5 F (36.9 C)] 98.5 F (36.9 C) (03/28 0400) Pulse Rate:  [67-82] 67 (03/28 0400) Resp:  [15-18] 18 (03/28 0400) BP: (100-134)/(44-63) 134/44 (03/28 0400) SpO2:  [93 %-100 %] 99 % (03/28 0754) Weight:  [141.2 kg] 141.2 kg (03/27 1428) Last BM Date: 12/15/18  Intake/Output from previous day: 03/27 0701 - 03/28 0700 In: 768 [P.O.:118; IV Piggyback:650] Out: 800 [Urine:800] Intake/Output this shift: No intake/output data recorded.  Left perianal I&D site noted - no pus; thickened/indurated. No erythema, non tender  Lab Results:  Recent Labs    12/14/18 0912 12/15/18 0242  WBC 13.3* 12.2*  HGB 11.6* 10.3*  HCT 36.9 32.8*  PLT 246 209   BMET Recent Labs    12/14/18 0912 12/15/18 0242  NA 139 136  K 4.0 4.4  CL 98 100  CO2 25 25  GLUCOSE 109* 338*  BUN 32* 33*  CREATININE 1.81* 1.80*  CALCIUM 9.6 9.2   PT/INR No results for input(s): LABPROT, INR in the last 72 hours. ABG No results for input(s): PHART, HCO3 in the last 72 hours.  Invalid input(s): PCO2, PO2  Studies/Results: Ct Pelvis Wo Contrast  Result Date: 12/14/2018 CLINICAL DATA:  Pt began having night sweats last night,chills; has a non-healing fx. of RIGHT foot . Pt also had pain in her LEFT buttock; pt states that she has had "boils" in the past, and that "they will usually burst and clear up on their own EXAM: CT PELVIS WITHOUT CONTRAST TECHNIQUE: Multidetector CT imaging of the pelvis was performed following the standard protocol without intravenous contrast. COMPARISON:  None. FINDINGS: Urinary Tract:  No abnormality visualized. Bowel:  Unremarkable visualized pelvic bowel loops. Vascular/Lymphatic: Normal caliber abdominal aorta with atherosclerosis. Reproductive:  No mass or other significant abnormality Other: 2.9 x 3.5 cm complex fluid collection with  surrounding inflammatory changes in the left posteromedial perianal subcutaneous fat concerning for a perianal abscess with a left lateral perianal fistula. Musculoskeletal: No acute osseous abnormality. No aggressive osseous lesion. Degenerative disc disease with disc height loss at L4-5 and L5-S1. Bilateral foraminal stenosis at L5-S1. IMPRESSION: 1. 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 2.  Aortic Atherosclerosis (ICD10-I70.0). Electronically Signed   By: Elige Ko   On: 12/14/2018 11:42   US Renal  Result Date: 12/14/2018 CLINICAL DATA:  Acute renal injury EXAM: RENAL / URINARY TRACT ULTRASOUND COMPLETE COMPARISON:  CT scan 12/14/2018 FINDINGS: Right Kidney: Renal measurements: 12.3 by 5.2 by 6.2 cm = volume: 209 mL. Mildly abnormal accentuated right renal echogenicity, higher than the liver as shown on image 6. No mass or hydronephrosis visualized. Left Kidney: Renal measurements: 11.9 by 5.8 by 6.0 cm = volume: 216 mL. Mildly accentuated left renal echogenicity. No mass or hydronephrosis visualized. Bladder: Appears normal for degree of bladder distention. IMPRESSION: 1. Accentuated renal echogenicity, probably from chronic medical renal disease. Electronically Signed   By: Gaylyn Rong M.D.   On: 12/14/2018 17:36   Dg Foot Complete Right  Result Date: 12/14/2018 CLINICAL DATA:  Diabetic patient right foot pain.  No known injury. EXAM: RIGHT FOOT COMPLETE - 3+ VIEW COMPARISON:  None. FINDINGS: No acute bony or joint abnormality is identified. There is partial visualization of fixation of a distal  fibular fracture and syndesmotic stabilization. Fracture lines are visible. A linear radiopaque foreign body most consistent with a needle is seen in the plantar soft tissues of the foot and measures 1.2 cm in length. The needle is positioned  along the plantar surface of the distal diaphysis of the second metatarsal. No soft tissue gas, bony destructive change or periosteal reaction is identified. Soft tissues about the ankle and proximal foot appear swollen. IMPRESSION: Negative for acute bony abnormality. Soft tissue swelling about the proximal foot and ankle. Metallic foreign body along the plantar surface of the second metatarsal is most consistent with a needle measuring 1.2 cm. Status post fixation of a distal fibular fracture and stabilization of the syndesmosis. Electronically Signed   By: Drusilla Kanner M.D.   On: 12/14/2018 09:45    Anti-infectives: Anti-infectives (From admission, onward)   Start     Dose/Rate Route Frequency Ordered Stop   12/16/18 0000  vancomycin (VANCOCIN) 1,500 mg in sodium chloride 0.9 % 500 mL IVPB     1,500 mg 250 mL/hr over 120 Minutes Intravenous Every 36 hours 12/14/18 1052     12/15/18 0000  doxycycline (VIBRA-TABS) 100 MG tablet    Note to Pharmacy:  Further advise patient to stop Bactrim   100 mg Oral 2 times daily 12/15/18 1009     12/15/18 0000  amoxicillin-clavulanate (AUGMENTIN) 875-125 MG tablet     1 tablet Oral 2 times daily 12/15/18 1009 12/21/18 2359   12/14/18 2200  amoxicillin-clavulanate (AUGMENTIN) 875-125 MG per tablet 1 tablet     1 tablet Oral Every 12 hours 12/14/18 1319     12/14/18 1100  vancomycin (VANCOCIN) 2,500 mg in sodium chloride 0.9 % 500 mL IVPB     2,500 mg 250 mL/hr over 120 Minutes Intravenous  Once 12/14/18 1052 12/14/18 1759   12/14/18 1045  metroNIDAZOLE (FLAGYL) IVPB 500 mg     500 mg 100 mL/hr over 60 Minutes Intravenous  Once 12/14/18 1036 12/14/18 1304   12/14/18 1045  piperacillin-tazobactam (ZOSYN) IVPB 3.375 g     3.375 g 100 mL/hr over 30 Minutes Intravenous  Once 12/14/18 1036 12/14/18 1759      Assessment/Plan: Perianal abscess  No further drainage needed, can dc home whenever ready from medical standpoint with five days of abx. No  follow up needed.   Emelia Loron 12/15/2018

## 2018-12-15 NOTE — Discharge Summary (Signed)
Name: Felicia Rivas MRN: [id number] DOB: 06-17-1956 63 y.o. PCP: [name], MD  Date of Admission: 12/14/2018  8:06 AM Date of Discharge: 12/15/2018 Attending Physician: Dr. Harmon Dun  Discharge Diagnosis: 1. Perianal Abscess 2. Diabetic post surgical nonhealing foot wounds 3. DMII, insulin dependent 4. CAD 5. Carotid stenosis s/p stenting 6. Combined diastolic and systolic heart failure  Discharge Medications: Allergies as of 12/15/2018      Reactions   Lisinopril Cough, Nausea And Vomiting, Other (See Comments)   ACE Inhibitor Cough ?Cough - changed to ARB 02/2013. ACE Inhibitor Cough ACE Inhibitor Cough ?Cough - changed to ARB 02/2013. ACE Inhibitor Cough ?Cough - changed to ARB 02/2013.   Other Hives   Unknown IV antibiotic   Tramadol    Causes respiratory distress      Medication List    STOP taking these medications   sulfamethoxazole-trimethoprim 800-160 MG tablet Commonly known as:  BACTRIM DS,SEPTRA DS     TAKE these medications   albuterol 108 (90 Base) MCG/ACT inhaler Commonly known as:  PROVENTIL HFA;VENTOLIN HFA Inhale 2 puffs into the lungs every 6 (six) hours as needed for wheezing or shortness of breath.   amoxicillin-clavulanate 875-125 MG tablet Commonly known as:  Augmentin Take 1 tablet by mouth 2 (two) times daily for 6 days.   aspirin EC 81 MG tablet Take 1 tablet (81 mg total) by mouth daily.   atorvastatin 80 MG tablet Commonly known as:  LIPITOR Take 1 tablet (80 mg total) by mouth daily.   budesonide-formoterol 160-4.5 MCG/ACT inhaler Commonly known as:  SYMBICORT Inhale 2 puffs into the lungs every 12 (twelve) hours.   clopidogrel 75 MG tablet Commonly known as:  Plavix Take 1 tablet (75 mg total) by mouth daily.   collagenase ointment Commonly known as:  SANTYL Apply topically daily.   doxycycline 100 MG tablet Commonly known as:  VIBRA-TABS Take 1 tablet (100 mg total) by mouth 2 (two) times daily.   FREESTYLE  LITE test strip Generic drug:  glucose blood Check bg 3 times/day   Precision QID Test test strip Generic drug:  glucose blood Check bg 3 times/day   glucose blood test strip Check bg 3 times/day   furosemide 40 MG tablet Commonly known as:  LASIX Take 3 tablets (120 mg total) by mouth 2 (two) times daily. TAKE ADDITIONAL 80MG  AT NOON X2 DAYS What changed:  additional instructions   gabapentin 300 MG capsule Commonly known as:  NEURONTIN Take 600 mg by mouth 3 (three) times daily.   insulin regular human CONCENTRATED 500 UNIT/ML injection Commonly known as:  HUMULIN R Inject 12-20 Units into the skin See admin instructions. Inject 20 units in the morning (8AM) and 12 units at night (10PM) with additional as needed. *Max daily dosage 65units/day   liraglutide 18 MG/3ML Sopn Commonly known as:  VICTOZA Inject 1.8 mg into the skin daily.   metoprolol succinate 25 MG 24 hr tablet Commonly known as:  TOPROL-XL Take 1 tablet (25 mg total) by mouth daily.   mupirocin cream 2 % Commonly known as:  BACTROBAN Apply 1 application topically 3 (three) times daily. Wound care   potassium chloride SA 20 MEQ tablet Commonly known as:  K-DUR,KLOR-CON Take 2 tablets (40 mEq total) by mouth daily.   Vitamin D (Ergocalciferol) 1.25 MG (50000 UT) Caps capsule Commonly known as:  DRISDOL Take 50,000 Units by mouth every Sunday.       Disposition and follow-up:   Felicia Rivas was  discharged from [facility] in Stable condition.  At the hospital follow up visit please address:  1.   A.)  Perianal Abscess: Follow-up with her PCP for additional intervention if this fails to heal. Discharged with Augmentin and doxycycline  B.)  Diabetic post surgical nonhealing foot wounds: Follow up with orthopedics and wound care C.)  DMII, insulin dependent: Discharged on home diabetes regimen, given A1c <7%, recommend deescalation of therapy if not already completed  2.  Labs /  imaging needed at time of follow-up: BMP, CBC  3.  Pending labs/ test needing follow-up: Urine culture, Bl Cx, negative at discharge  Follow-up Appointments: Follow-up Information    Fields, Janetta Hora, MD. Schedule an appointment as soon as possible for a visit.   Specialties:  Vascular Surgery, Cardiology Contact information: [address] [place] [address] [phone]        [name], MD. Schedule an appointment as soon as possible for a visit.   Specialty:  Internal Medicine Contact information: [address] 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 In addition, she was found to have a left perianal abscess adjacent to the anus for which she was admitted by the ER provider.   Right foot ulcers The patient was evaluated by orthopedic surgery who believed the lower extremity wounds to have been progressing slowly but satisfactorily. They recommended outpatient follow-up and continues MRSA antibiotic coverage. The patient was also evaluated by wound care who recommends heel elevation, topical debridement emollient, and dressing changes by nursing staff. Bactrim was discontinued due to persistent infection and elevated serum creatinine. Given doxycycline 14 days, please consider alternative if indicated.   Perianal abscess Patient afebrile while hospitalized, with mild leukocytosis of 13.3 down to 12.2 morning of discharge. She remained hemodynamically stable with a normal lactate. Patient remained free of systemic signs of infection while hospitalized. She underwent I&D by EDP but given her diabetes and poor distal wound healing she was a  candidate for additional antibiotic coverage with Augmentin for 7 days. She was seen by general surgery who recommended outpatient wound care.  Elevated serum creatinine:  Scr elevated to 1.8 on admission. Baseline appears to be ~1.0 recorded 3 months prior. Renal Ultrasound unremarkable. Likely prerenal in the setting of infection as well as a possible side effect of lasix increase. Also considered on the differential was the use of a sulfur antibiotic, bactrim, which can elevated serum creatinine. She endorsed good PO intake an output. Will likely need a BMP as an outpatient in one week. Discontinued Bactrim.  Discharge Vitals:   BP (!) 134/44 (BP Location: Left Arm)   Pulse 67   Temp 98.5 F (36.9 C) (Oral)   Resp 18   Ht  (1.676 m)   Wt (!) 141.2 kg   SpO2 99%   BMI 50.24 kg/m   Pertinent Labs, Studies, and Procedures:  CBC Latest Ref Rng & Units 12/15/2018 12/14/2018 08/22/2018  WBC 4.0 - 10.5 K/uL 12.2(H) 13.3(H) 10.5  Hemoglobin 12.0 - 15.0 g/dL 10.3(L) 11.6(L) 12.4  Hematocrit 36.0 - 46.0 % 32.8(L) 36.9 39.5  Platelets 150 - 400 K/uL 209 246 226  CMP Latest Ref Rng & Units 12/15/2018 12/14/2018 08/22/2018  Glucose 70 - 99 mg/dL 244(W) 102(V) 25(D)  BUN 8 - 23 mg/dL 66(Y) 40(H) 16  Creatinine 0.44 - 1.00 mg/dL 4.74(Q) 5.95(G) 3.87  Sodium 135 - 145 mmol/L 136 139 141  Potassium 3.5 - 5.1 mmol/L 4.4 4.0 3.4(L)  Chloride 98 - 111 mmol/L 100 98 105  CO2 22 - 32 mmol/L 25 25 24   Calcium 8.9 - 10.3 mg/dL 9.2 9.6 9.2  Total Protein 6.5 - 8.1 g/dL 6.6 - -  Total Bilirubin 0.3 - 1.2 mg/dL 0.7 - -  Alkaline Phos 38 - 126 U/L 122 - -  AST 15 - 41 U/L 19 - -  ALT 0 - 44 U/L 18 - -   CT Pelvis wo contrast: IMPRESSION: 1. 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  Discharge Instructions: Discharge Instructions    Call  MD for:  persistant nausea and vomiting   Complete by:  As directed    Call MD for:  redness, tenderness, or signs of infection (pain, swelling, redness, odor or green/yellow discharge around incision site)   Complete by:  As directed    Call MD for:  temperature >100.4   Complete by:  As directed    Diet - low sodium heart healthy   Complete by:  As directed    Discharge instructions   Complete by:  As directed    Please call your primary care doctor and discuss the need to continue antibiotics after your complete those prescribed today at discharge.  Please take the Augmentin until the course is complete by taking the tablets two times per day about twelve hours apart. Please also take the doxycycline 2 times per day about 12 hours apart.   If you develop a fever, chills, nausea, vomiting, muscle aches or pain, or redness or swelling at the site of the wounds please notify your doctor right away.   Increase activity slowly   Complete by:  As directed       Signed: Lanelle Bal, MD 12/16/2018, 12:04 PM   Pager: Pager# [phone]

## 2018-12-15 NOTE — Progress Notes (Addendum)
Subjective: The patient was sitting up in a chair today upon entering the room. She denied issues overnight including fever, chills, abdominal pain or foot pain. She experienced one bowel movement without pain or straining. She follows with vascular surgeries Dr. Darrick Penna for Q6 month peripheral vascular studies due to her poor wound healing. She also follows with orthopedic surgery outpatient.   Objective:  Vital signs in last 24 hours: Vitals:   12/14/18 1432 12/14/18 1909 12/14/18 2219 12/15/18 0400  BP: (!) 100/56  (!) 110/56 (!) 134/44  Pulse: 75  75 67  Resp: 18  18 18   Temp: 98.4 F (36.9 C)  97.7 F (36.5 C) 98.5 F (36.9 C)  TempSrc: Oral   Oral  SpO2: 100% 100% 99% 100%  Weight:      Height:       General: A/O x4, in no acute distress, afebrile, nondiaphoretic Cardio: RRR, no mrg's  Pulmonary: CTA bilaterally, no wheezing or crackles  Abdomen: Bowel sounds normal, soft, nontender MSK: BLE nontender (except with palpation of the right surgical wound site), non pitting mild edema and mild early venous stasis and arterial insufficiency disease bilaterally, unable to appreciate a DP or PT pulse, +1 cap refill in the RLE distal appendages  Psych: Appropriate affect, not depressed in appearance, engages well  Assessment/Plan:  Active Problems:   DM2 (diabetes mellitus, type 2) (HCC)   Acute renal injury (HCC)   Perianal abscess  Felicia Rivas is a 63 yo female with a medical history of CAD s/p CABG 2012, TIA, carotid artery stenosis s/p stent, HTN, HLD, DMII, diastolic heart failure, and asthma who presented with chills and diaphoresis in the setting of learning she has 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 In addition, she was found to have a left lateral perianal abscess for which she was admitted.   Right foot ulcers The patient was evaluated by orthopedic surgery who believes the wounds are progressing slowly but satisfactorily and as such she should undergo  continued outpatient care. They recommend continued MRSA coverage and outpatient f/u with her orthopedic surgeon and or wound care team. The patient was also evaluated by wound care who recommends heel elevation, topical debridement, and dressing changes by nursing staff. Plan - Continue wound care PRN - Plan to stop Vancomycin today - Start doxycycline 100mg  BID today  Perianal abscess Afebrile overnight. Leukocytosis of 13.3 down to 12.2. Hemodynamically stable. Normal lactate. Patient remains free of systemic signs of infection while hospitalized. Patient underwent I&D by EDP but will require ongoing antibiotic therapy with Augmentin x 5 days. Plan - General surgery consulted, appreciate recs - F/u blood cultures - Vancomycin to doxy and continue augmentin at discharge - Pain control: tylenol and home gabapentin 600mg  TID - Plan to discharge home today on doxy and Augmentin   Elevated serum creatinine:  Scr elevated to 1.8 again this am. Baseline appears to be ~1.0 3 months prior. Likely prerenal in the setting of infection or side effect of lasix increase. Possibly increased due sulfur antibiotic use with bactrim. Pt endorses good PO intake. Will likely need a BMP as an outpatient in one week Plan - Encourage PO intake.  - 1/2 liter NS bolus today - UA-with rare bacteria, moderate Hgb - Renal US unremarkable  - Discontinue bactrim on discharge.  CAD and Carotid stent: - Continue home aspirin, plavix, and statin   Combined Systolic and Diastolic heart failure - ECHO showed EF 45-50% three months ago. Plan - Continue home  lasix 120mg  BID and metoprolol 25mg  daily  DMII - A1c 6.6% three months prior. Home regimen includes humulin SSI BID and Victoza. Plan - SSI moderate - Will resume home intermediate acting if she remains hospitalized past today  Diet: Carb mod Code: Full VTE PPX: Enoxaparin Dispo: Anticipated discharge in approximately 0-1 day(s).   Felicia Bal, MD 12/15/2018, 6:47 AM Pager# [phone]

## 2018-12-15 NOTE — Progress Notes (Signed)
Felicia Rivas to be D/C'd home per MD order. Discussed with the patient and all questions fully answered. Skin intact without evidence of skin break down, no evidence of skin tears noted. Dressing changed to left ankle wound. IV catheter discontinued intact. Site without signs and symptoms of complications. Dressing and pressure applied.  An After Visit Summary was printed and given to the patient.  Patient escorted via WC, and D/C home via private auto.  Jon Gills  12/15/2018 1:27 PM

## 2018-12-16 LAB — URINE CULTURE: CULTURE: NO GROWTH

## 2018-12-17 ENCOUNTER — Telehealth: Payer: Self-pay | Admitting: *Deleted

## 2018-12-17 NOTE — Telephone Encounter (Signed)
Call from patient's daughter. 12/14/2018 due to admit to ER at [facility]. Instructed to follow up with this office ASAP. Appointment given for 12/20/2018 with vascular studies/ NP.

## 2018-12-19 LAB — CULTURE, BLOOD (ROUTINE X 2)
Culture: NO GROWTH
Culture: NO GROWTH

## 2018-12-20 ENCOUNTER — Ambulatory Visit: Payer: Self-pay | Admitting: Family

## 2018-12-20 ENCOUNTER — Encounter (HOSPITAL_COMMUNITY): Payer: Self-pay

## 2018-12-24 ENCOUNTER — Telehealth: Payer: Self-pay | Admitting: Cardiology

## 2018-12-24 DIAGNOSIS — Z79899 Other long term (current) drug therapy: Secondary | ICD-10-CM

## 2018-12-24 DIAGNOSIS — N289 Disorder of kidney and ureter, unspecified: Secondary | ICD-10-CM

## 2018-12-24 NOTE — Telephone Encounter (Signed)
Patient's daughter Raqueal called stating they are going to refer her mother to a kidney specialist.  They ran some blood work and her kidney function has declined, they think it's due to the lasix use.  She wanted to discuss this first with Dr. Jens Som before any medication changes were made.

## 2018-12-24 NOTE — Telephone Encounter (Signed)
Daughter call to give the number of the kidney function test.  She said the PCP office was going to fax over the test results as wells.  Globulin  total 2.9 Albumin-Globulin 1.3   If you need anything further you can call her.

## 2018-12-24 NOTE — Telephone Encounter (Signed)
I called patient, spoke with daughter. She verbalized understanding to stay on medication for now.  I advised if she had recent lab work to call us back and let us know what the numbers were and I would let NP know of those new numbers.   BMET ordered for one week from today to recheck kidney.   Daughter verbalized understanding.

## 2018-12-24 NOTE — Telephone Encounter (Signed)
Spoke with patient daughter. She states that according to the Primary Care doctor- her kidney function is getting worse, and they believe it is coming from the Lasix use. They advised patient to stop the lasix, but the daughter does not want to do that, as they have been having issues with swelling. (see previous message: weight info) patient daughter states that her mothers weight is trending back up and is not 314 again. Without any extra dose, only taking Lasix as prescribed.   Please advise, as Dr.Crenshaw is out of office- will route to NP who recently seen patient.   Thank you!

## 2018-12-24 NOTE — Telephone Encounter (Signed)
I do not have most recent labs she is speaking of unless they mean her labs from the hospitalization where she was seen for peri-rectal abscess. Creatinine was 1.8 compared to  0.8. She is to be on lasix 120 mg BID but was also supposed to take 2 days of extra 60 mg at noon for those 2 days only.   She should continue to take 120 mg BID as directed on an empty stomach. If weight continues to rise, should take extra lasix as directed. Repeat her BMET in one week to check her kidney status unless nephrology as already planned to do so.   She may need to be seen in person if weight continues to rise. I would not decrease the dose of lasix at this time unless her BP is dropping or she is symptomatic with dizziness or near syncope.

## 2018-12-24 NOTE — Telephone Encounter (Signed)
I advised I would route this to the NP to have for recent labs that patient had drawn.  Thanks!

## 2018-12-25 ENCOUNTER — Telehealth: Payer: Self-pay | Admitting: Cardiology

## 2018-12-25 NOTE — Telephone Encounter (Signed)
I am sorry, I do not have access to the faxed labs, as I am working from home. Please have the DOD look at the labs if they come to the office. Thank you.

## 2018-12-25 NOTE — Telephone Encounter (Signed)
Will also route this message to DOD nurse as well.  Thanks!

## 2018-12-25 NOTE — Telephone Encounter (Signed)
New Message    Pts daughter is calling to follow up about the labs. She said she has a copy of it and can send it if she needs too    Please call back

## 2018-12-26 NOTE — Telephone Encounter (Signed)
Per daughter will re fax lab values and BUN was 28 and Creatinine 1.62./cy

## 2018-12-26 NOTE — Telephone Encounter (Signed)
Spoke with daughter PMD instructed pt to stop Furosemide due to kidney function and has appt with nephrology Per daughter was told to have pt to cont until Dr Jens Som reviewed labs Unable to locate said labs Will call daughter back and have them re faxed .Zack Seal

## 2018-12-27 ENCOUNTER — Encounter (HOSPITAL_COMMUNITY): Payer: Self-pay

## 2018-12-27 ENCOUNTER — Ambulatory Visit: Payer: Self-pay | Admitting: Vascular Surgery

## 2018-12-28 NOTE — Telephone Encounter (Signed)
Pt's daughter will resend labs to office .Felicia Rivas

## 2018-12-28 NOTE — Telephone Encounter (Signed)
Hard copy of labs received from outside facility and placed in Dr Ludwig Clarks mail box for review ./cy

## 2018-12-31 ENCOUNTER — Encounter (HOSPITAL_COMMUNITY): Payer: Self-pay

## 2018-12-31 ENCOUNTER — Ambulatory Visit: Payer: Self-pay | Admitting: Family

## 2018-12-31 NOTE — Telephone Encounter (Signed)
Spoke with pt dtr, no changes after labs reviewed by dr Jens Som

## 2018-12-31 NOTE — Telephone Encounter (Signed)
See 12/31/18 telephone note Dr.Crenshaw received labs.

## 2019-01-02 ENCOUNTER — Telehealth: Payer: Self-pay | Admitting: Cardiology

## 2019-01-02 NOTE — Telephone Encounter (Signed)
Clinic note received from [facility] on 01/02/19, Aptt 01/18/19 @ 11:40AM.[place]

## 2019-01-11 NOTE — Progress Notes (Signed)
Virtual Visit via Video Note   This visit type was conducted due to national recommendations for restrictions regarding the COVID-19 Pandemic (e.g. social distancing) in an effort to limit this patient's exposure and mitigate transmission in our community.  Due to her co-morbid illnesses, this patient is at least at moderate risk for complications without adequate follow up.  This format is felt to be most appropriate for this patient at this time.  All issues noted in this document were discussed and addressed.  A limited physical exam was performed with this format.  Please refer to the patient's chart for her consent to telehealth for [facility].   Evaluation Performed:  Follow-up visit  Date:  01/18/2019   ID:  Felicia BeckmannSharon Rivas, DOB 06/27/1956, MRN [id number]  Patient Location: Home Provider Location: Home  PCP:  [name], [name], MD  Cardiologist:  Olga MillersBrian Lovinia Snare, MD   Chief Complaint:  FU CAD  History of Present Illness:    FU CAD and diastolic CHF. She is status post CABG 08/2011.Echocardiogram February 2016 showed normal LV function and grade 2 diastolic dysfunction. Carotid Dopplers April 2018 showed 80-99% right and 1-39% left. Patient had carotid stent placed July 2018.   Peripheral vascular disease is followed by vascular surgery.  Nuclear study December 2019 showed ejection fraction 47%, reversible medium size inferolateral defect suggestive of ischemia.  Treated medically.  Pain with acute on chronic diastolic congestive heart failure March 2020.  Lasix was increased.  Since she was last seen, she does have some dyspnea on exertion and has noticed increased weight gain and bilateral lower extremity edema.  This was after her Lasix was recently reduced by urology.  She denies chest pain, palpitations or syncope.  The patient does not have symptoms concerning for COVID-19 infection (fever, chills, cough, or new shortness of breath).    Past Medical History:  Diagnosis Date    Asthma    Asthma    CAD (coronary artery disease)    a. NSTEMI 12/12 -> s/p CABGx3.    Carotid artery disease (HCC)    a. Carotid US (08/2013): R 60-79%; L 40-59%; f/u 6 mos.   Carotid stenosis    a. Carotid US (08/2013):  R 60-79%; L 40-59%; f/u 6 mos   Chronic diastolic CHF (congestive heart failure) (HCC)    a. Echo 08/2011: mod LVH, nl EF. b. Echo 01/2013: EF 55-60%, mod-sev LVH, mod-sev LA dilitation   Chronic diastolic heart failure (HCC)    a. Echo 08/2011: mod LVH, nl EF. b. Echo 01/2013: EF 55-60%, mod-sev LVH, mod-sev LA dilitation.   Complication of anesthesia    '' Myself & my family wake up during surgery "   Complication of anesthesia    Coronary artery disease    a. NSTEMI 12/12 -> s/p CABGx3.   Cough    Cough    a. 2014: Suspect a combination of CHF, GERD and ACE inhibitor-induced cough.   Diabetes mellitus    Diabetes mellitus (HCC)    History of chicken pox    HLD (hyperlipidemia)    HTN (hypertension)    Hyperlipidemia    Hypertension    Morbid obesity (HCC)    Myocardial infarction (HCC)    PONV (postoperative nausea and vomiting)    S/P CABG (coronary artery bypass graft)    a. 2012 - Dr. Laneta SimmersBartle;  LIMA-LAD, SVG-OM 2, SVG-PDA.   S/P CABG x 3 09/07/11   Dr. Laneta SimmersBartle;   LIMA-LAD, SVG-OM 2, SVG-PDA.   Sleep disturbance  a. Had sleep study several years ago, wasn't told she has OSA but told she needs O2 at night.   Sleep disturbance    Had sleep study several years ago, wasn't told she has OSA but told she needs O2 at night   Stroke [place])    Stroke [facility]) 2006   denies residual   TIA (transient ischemic attack)    Past Surgical History:  Procedure Laterality Date   CAROTID ANGIOGRAPHY N/A 03/17/2017   Procedure: Carotid Angiography;  Surgeon: Sherren Kerns, MD;  [facility] INVASIVE CV LAB;  Service: Cardiovascular;  Laterality: N/A;   CAROTID PTA/STENT INTERVENTION Right 03/29/2017   Procedure: Carotid PTA/Stent  Intervention;  Surgeon: Sherren Kerns, MD;  Location: [facility] INVASIVE CV LAB;  Service: Cardiovascular;  Laterality: Right;   CHOLECYSTECTOMY     CHOLECYSTECTOMY  1990's   CORONARY ARTERY BYPASS GRAFT     CORONARY ARTERY BYPASS GRAFT  09/07/2011   CABG X3; Procedure: CORONARY ARTERY BYPASS GRAFTING (CABG);  Surgeon: Alleen Borne, MD;  Location: [facility] OR;  Service: Open Heart Surgery;  Laterality: N/A;   LEFT HEART CATHETERIZATION WITH CORONARY ANGIOGRAM N/A 09/02/2011   Procedure: LEFT HEART CATHETERIZATION WITH CORONARY ANGIOGRAM;  Surgeon: Herby Abraham, MD;  Location: [facility] CATH LAB;  Service: Cardiovascular;  Laterality: N/A;   TUBAL LIGATION  1981     Current Meds  Medication Sig   albuterol (PROVENTIL HFA;VENTOLIN HFA) 108 (90 BASE) MCG/ACT inhaler Inhale 2 puffs into the lungs every 6 (six) hours as needed for wheezing or shortness of breath.   aspirin EC 81 MG tablet Take 1 tablet (81 mg total) by mouth daily.   atorvastatin (LIPITOR) 80 MG tablet Take 1 tablet (80 mg total) by mouth daily.   budesonide-formoterol (SYMBICORT) 160-4.5 MCG/ACT inhaler Inhale 2 puffs into the lungs every 12 (twelve) hours.   clopidogrel (PLAVIX) 75 MG tablet Take 1 tablet (75 mg total) by mouth daily.   collagenase (SANTYL) ointment Apply topically daily.   furosemide (LASIX) 40 MG tablet Take 3 tablets (120 mg total) by mouth 2 (two) times daily. TAKE ADDITIONAL 80MG  AT NOON X2 DAYS (Patient taking differently: Take 120 mg by mouth 2 (two) times daily. )   gabapentin (NEURONTIN) 300 MG capsule Take 600 mg by mouth 3 (three) times daily.    glucose blood (FREESTYLE LITE) test strip Check bg 3 times/day   glucose blood (PRECISION QID TEST) test strip Check bg 3 times/day   glucose blood test strip Check bg 3 times/day   insulin regular human CONCENTRATED (HUMULIN R) 500 UNIT/ML injection Inject 12-20 Units into the skin See admin instructions. Inject 20 units in the morning (8AM) and 12  units at night (10PM) with additional as needed. *Max daily dosage 65units/day   Liraglutide 18 MG/3ML SOPN Inject 1.8 mg into the skin daily.   metoprolol succinate (TOPROL-XL) 25 MG 24 hr tablet Take 1 tablet (25 mg total) by mouth daily.   OXYGEN Inhale 2 L/day into the lungs.   potassium chloride SA (K-DUR,KLOR-CON) 20 MEQ tablet Take 2 tablets (40 mEq total) by mouth daily.   Vitamin D, Ergocalciferol, (DRISDOL) 1.25 MG (50000 UT) CAPS capsule Take 50,000 Units by mouth every Sunday.     Allergies:   Lisinopril; Other; and Tramadol   Social History   Tobacco Use   Smoking status: Former Smoker    Packs/day: 0.50    Years: 25.00    Pack years: 12.50    Types: Cigarettes  Last attempt to quit: 11/11/2011    Years since quitting: 7.1   Smokeless tobacco: Former Engineer, water Use Topics   Alcohol use: No   Drug use: No     Family Hx: The patient's family history includes Cancer in her mother; Coronary artery disease in her father; Diabetes in her mother; Heart disease in an other family member; Heart failure in her mother.  ROS:   Please see the history of present illness.    Patient denies fevers, chills or productive cough. Some residual ankle swelling from previous fracture All other systems reviewed and are negative.  Recent Labs: 07/17/2018: NT-Pro BNP 399 12/15/2018: ALT 18; BUN 33; Creatinine, Ser 1.80; Hemoglobin 10.3; Platelets 209; Potassium 4.4; Sodium 136   Recent Lipid Panel Lab Results  Component Value Date/Time   CHOL 139 10/16/2017 08:14 AM   TRIG 127 10/16/2017 08:14 AM   HDL 34 (L) 10/16/2017 08:14 AM   CHOLHDL 4.1 10/16/2017 08:14 AM   CHOLHDL 4.0 11/16/2016 03:03 PM   LDLCALC 80 10/16/2017 08:14 AM    Wt Readings from Last 3 Encounters:  01/18/19 (!) 310 lb (140.6 kg)  12/14/18 (!) 311 lb 4.6 oz (141.2 kg)  12/05/18 (!) 320 lb (145.2 kg)     Objective:    Vital Signs:  BP 120/63    Ht  (1.676 m)    Wt (!) 310 lb (140.6 kg)     BMI 50.04 kg/m    VITAL SIGNS:  reviewed  Normal affect And answers questions appropriately No acute distress Remainder physical exam not performed (telehealth visit; coronavirus pandemic)  ASSESSMENT & PLAN:    1. Coronary artery disease-patient denies chest pain.  Plan to continue medical therapy with aspirin and statin. 2. Acute on chronic diastolic congestive heart failure-patient's Lasix was recently decreased and she now has increased weight, dyspnea and lower extremity edema.  I will increase Lasix to 120 mg in the morning and 80 mg in the afternoon.  Check potassium and renal function in 1 week.  I have asked her to weigh daily with a goal weight of approximately 304 pounds.  We discussed low-sodium diet as well. 3. Hypertension-patient's blood pressure is controlled.  Continue present medications and follow. 4. Hyperlipidemia-continue statin. 5. Carotid artery disease/peripheral vascular disease-continue aspirin and statin.  Followed by vascular surgery. 6. Morbid obesity-we discussed the importance of weight loss and exercise.  We also discussed the importance of diet.  The importance of social distancing was discussed today.  Time:   Today, I have spent 15 minutes with the patient with telehealth technology discussing the above problems.     Medication Adjustments/Labs and Tests Ordered: Current medicines are reviewed at length with the patient today.  Concerns regarding medicines are outlined above.   Tests Ordered: No orders of the defined types were placed in this encounter.   Medication Changes: No orders of the defined types were placed in this encounter.   Disposition:  Follow up in 3 month(s)  Signed, Olga Millers, MD  01/18/2019 10:55 AM    [facility]

## 2019-01-16 ENCOUNTER — Telehealth: Payer: Self-pay | Admitting: Cardiology

## 2019-01-18 ENCOUNTER — Encounter: Payer: Self-pay | Admitting: Cardiology

## 2019-01-18 ENCOUNTER — Telehealth (INDEPENDENT_AMBULATORY_CARE_PROVIDER_SITE_OTHER): Payer: BC Managed Care – PPO | Admitting: Cardiology

## 2019-01-18 VITALS — BP 120/63 | Ht 66.0 in | Wt 310.0 lb

## 2019-01-18 DIAGNOSIS — I5032 Chronic diastolic (congestive) heart failure: Secondary | ICD-10-CM

## 2019-01-18 DIAGNOSIS — E7800 Pure hypercholesterolemia, unspecified: Secondary | ICD-10-CM

## 2019-01-18 DIAGNOSIS — I25810 Atherosclerosis of coronary artery bypass graft(s) without angina pectoris: Secondary | ICD-10-CM

## 2019-01-18 DIAGNOSIS — I10 Essential (primary) hypertension: Secondary | ICD-10-CM

## 2019-01-18 MED ORDER — FUROSEMIDE 40 MG PO TABS: ORAL_TABLET | ORAL | 3 refills | Status: DC

## 2019-01-18 NOTE — Telephone Encounter (Signed)
Patient is having problems with the link that was sent to them

## 2019-01-18 NOTE — Telephone Encounter (Signed)
This encounter was created in error - please disregard.

## 2019-01-18 NOTE — Patient Instructions (Signed)
Medication Instructions:  INCREASE FUROSEMIDE TO 120 MG IN THE MORNING AND 80 MG IN THE EVENING= 3 TABLETS IN THE MORNING AND 2 TABLETS IN THE AFTERNOON If you need a refill on your cardiac medications before your next appointment, please call your pharmacy.   Lab work: Your physician recommends that you return for lab work in: ONE WEEK If you have labs (blood work) drawn today and your tests are completely normal, you will receive your results only by: [name] MyChart Message (if you have MyChart) OR . A paper copy in the mail If you have any lab test that is abnormal or we need to change your treatment, we will call you to review the results.  Follow-Up: At [facility], you and your health needs are our priority.  As part of our continuing mission to provide you with exceptional heart care, we have created designated Provider Care Teams.  These Care Teams include your primary Cardiologist (physician) and Advanced Practice Providers (APPs -  Physician Assistants and Nurse Practitioners) who all work together to provide you with the care you need, when you need it. Your physician recommends that you schedule a follow-up appointment in: 3 MONTHS WITH DR Jens Som

## 2019-01-28 ENCOUNTER — Other Ambulatory Visit: Payer: Self-pay

## 2019-01-28 DIAGNOSIS — I6523 Occlusion and stenosis of bilateral carotid arteries: Secondary | ICD-10-CM

## 2019-01-28 MED ORDER — CLOPIDOGREL BISULFATE 75 MG PO TABS: 75.0000 mg | ORAL_TABLET | Freq: Every day | ORAL | 3 refills | Status: DC

## 2019-02-01 ENCOUNTER — Other Ambulatory Visit: Payer: Self-pay | Admitting: Vascular Surgery

## 2019-02-01 DIAGNOSIS — I6523 Occlusion and stenosis of bilateral carotid arteries: Secondary | ICD-10-CM

## 2019-02-01 MED ORDER — CLOPIDOGREL BISULFATE 75 MG PO TABS: 75.0000 mg | ORAL_TABLET | Freq: Every day | ORAL | 3 refills | Status: DC

## 2019-02-04 ENCOUNTER — Other Ambulatory Visit: Payer: Self-pay | Admitting: Cardiology

## 2019-02-04 DIAGNOSIS — I25810 Atherosclerosis of coronary artery bypass graft(s) without angina pectoris: Secondary | ICD-10-CM

## 2019-02-05 ENCOUNTER — Telehealth: Payer: Self-pay | Admitting: *Deleted

## 2019-02-05 LAB — BASIC METABOLIC PANEL
BUN/Creatinine Ratio: 17 (ref 12–28)
BUN: 19 mg/dL (ref 8–27)
CO2: 29 mmol/L (ref 20–29)
Calcium: 9.6 mg/dL (ref 8.7–10.3)
Chloride: 101 mmol/L (ref 96–106)
Creatinine, Ser: 1.11 mg/dL — ABNORMAL HIGH (ref 0.57–1.00)
GFR calc Af Amer: 61 mL/min/{1.73_m2} (ref >59)
GFR calc non Af Amer: 53 mL/min/{1.73_m2} — ABNORMAL LOW (ref >59)
Glucose: 143 mg/dL — ABNORMAL HIGH (ref 65–99)
Potassium: 4.1 mmol/L (ref 3.5–5.2)
Sodium: 143 mmol/L (ref 134–144)

## 2019-02-05 NOTE — Telephone Encounter (Signed)
Spoke with pt regarding lab work, she reports since Monday 5-11 she has gained up to 318 lb today. Her goal weight is 304 lb. She was instructed to increase furosemide to 120 mg twice daily for the next 3 days. If her weight is not down after 3 days she will let us know.

## 2019-02-07 ENCOUNTER — Telehealth: Payer: Self-pay | Admitting: Cardiology

## 2019-02-07 NOTE — Telephone Encounter (Signed)
Okay to arrange evaluation with electrophysiology Felicia Rivas

## 2019-02-07 NOTE — Telephone Encounter (Signed)
New Message     Pts daugther is calling with the pt and they are wondering if her daughter Felicia Rivas can be a self referral pt. She sees another cardiologist but they will not refer her, she also is self pay    Please call

## 2019-02-07 NOTE — Telephone Encounter (Signed)
Pt's daughter has cardiologist in [place] and is not happy with care is wanting to see one of our EP MD Pt has a dual chamber device (medtronic) that was placed last Sept for sick sinus syndrome. Will forward to Dr Jens Som for advice./cy Daughters phone number is attached to this phone note ./cy

## 2019-02-07 NOTE — Telephone Encounter (Signed)
Will forward message to Glynda Jaeger to call and schedule appt.Pt name Felicia Rivas [phone]

## 2019-03-18 NOTE — Telephone Encounter (Signed)
Opened in error

## 2019-03-25 ENCOUNTER — Encounter (HOSPITAL_COMMUNITY): Payer: BC Managed Care – PPO

## 2019-03-26 ENCOUNTER — Other Ambulatory Visit: Payer: Self-pay

## 2019-03-26 ENCOUNTER — Inpatient Hospital Stay (HOSPITAL_COMMUNITY)
Admission: EM | Admit: 2019-03-26 | Discharge: 2019-04-05 | DRG: 291 | Disposition: A | Discharge status: Home Health | Payer: BC Managed Care – PPO | Attending: Internal Medicine | Admitting: Internal Medicine

## 2019-03-26 ENCOUNTER — Emergency Department (HOSPITAL_COMMUNITY): Payer: BC Managed Care – PPO

## 2019-03-26 ENCOUNTER — Telehealth: Payer: Self-pay | Admitting: Cardiology

## 2019-03-26 ENCOUNTER — Encounter (HOSPITAL_COMMUNITY): Payer: Self-pay | Admitting: Emergency Medicine

## 2019-03-26 ENCOUNTER — Observation Stay (HOSPITAL_COMMUNITY): Payer: BC Managed Care – PPO

## 2019-03-26 DIAGNOSIS — I5023 Acute on chronic systolic (congestive) heart failure: Secondary | ICD-10-CM | POA: Diagnosis not present

## 2019-03-26 DIAGNOSIS — R0602 Shortness of breath: Secondary | ICD-10-CM | POA: Diagnosis not present

## 2019-03-26 DIAGNOSIS — Z8249 Family history of ischemic heart disease and other diseases of the circulatory system: Secondary | ICD-10-CM

## 2019-03-26 DIAGNOSIS — I5033 Acute on chronic diastolic (congestive) heart failure: Secondary | ICD-10-CM | POA: Diagnosis not present

## 2019-03-26 DIAGNOSIS — Z951 Presence of aortocoronary bypass graft: Secondary | ICD-10-CM

## 2019-03-26 DIAGNOSIS — I10 Essential (primary) hypertension: Secondary | ICD-10-CM | POA: Diagnosis present

## 2019-03-26 DIAGNOSIS — I447 Left bundle-branch block, unspecified: Secondary | ICD-10-CM | POA: Diagnosis not present

## 2019-03-26 DIAGNOSIS — Z20828 Contact with and (suspected) exposure to other viral communicable diseases: Secondary | ICD-10-CM | POA: Diagnosis present

## 2019-03-26 DIAGNOSIS — Z794 Long term (current) use of insulin: Secondary | ICD-10-CM

## 2019-03-26 DIAGNOSIS — Z7902 Long term (current) use of antithrombotics/antiplatelets: Secondary | ICD-10-CM

## 2019-03-26 DIAGNOSIS — M25559 Pain in unspecified hip: Secondary | ICD-10-CM | POA: Diagnosis present

## 2019-03-26 DIAGNOSIS — I509 Heart failure, unspecified: Secondary | ICD-10-CM

## 2019-03-26 DIAGNOSIS — M48062 Spinal stenosis, lumbar region with neurogenic claudication: Secondary | ICD-10-CM | POA: Insufficient documentation

## 2019-03-26 DIAGNOSIS — I872 Venous insufficiency (chronic) (peripheral): Secondary | ICD-10-CM | POA: Diagnosis present

## 2019-03-26 DIAGNOSIS — D649 Anemia, unspecified: Secondary | ICD-10-CM | POA: Diagnosis present

## 2019-03-26 DIAGNOSIS — I2510 Atherosclerotic heart disease of native coronary artery without angina pectoris: Secondary | ICD-10-CM

## 2019-03-26 DIAGNOSIS — E785 Hyperlipidemia, unspecified: Secondary | ICD-10-CM | POA: Diagnosis present

## 2019-03-26 DIAGNOSIS — E1165 Type 2 diabetes mellitus with hyperglycemia: Secondary | ICD-10-CM | POA: Diagnosis present

## 2019-03-26 DIAGNOSIS — M549 Dorsalgia, unspecified: Secondary | ICD-10-CM | POA: Diagnosis present

## 2019-03-26 DIAGNOSIS — I130 Hypertensive heart and chronic kidney disease with heart failure and stage 1 through stage 4 chronic kidney disease, or unspecified chronic kidney disease: Principal | ICD-10-CM | POA: Diagnosis present

## 2019-03-26 DIAGNOSIS — L97919 Non-pressure chronic ulcer of unspecified part of right lower leg with unspecified severity: Secondary | ICD-10-CM | POA: Diagnosis present

## 2019-03-26 DIAGNOSIS — I878 Other specified disorders of veins: Secondary | ICD-10-CM | POA: Diagnosis present

## 2019-03-26 DIAGNOSIS — N289 Disorder of kidney and ureter, unspecified: Secondary | ICD-10-CM

## 2019-03-26 DIAGNOSIS — Z8673 Personal history of transient ischemic attack (TIA), and cerebral infarction without residual deficits: Secondary | ICD-10-CM

## 2019-03-26 DIAGNOSIS — Z7982 Long term (current) use of aspirin: Secondary | ICD-10-CM

## 2019-03-26 DIAGNOSIS — I252 Old myocardial infarction: Secondary | ICD-10-CM

## 2019-03-26 DIAGNOSIS — I4729 Other ventricular tachycardia: Secondary | ICD-10-CM

## 2019-03-26 DIAGNOSIS — J9621 Acute and chronic respiratory failure with hypoxia: Secondary | ICD-10-CM | POA: Diagnosis present

## 2019-03-26 DIAGNOSIS — L03116 Cellulitis of left lower limb: Secondary | ICD-10-CM | POA: Diagnosis present

## 2019-03-26 DIAGNOSIS — E1151 Type 2 diabetes mellitus with diabetic peripheral angiopathy without gangrene: Secondary | ICD-10-CM | POA: Diagnosis present

## 2019-03-26 DIAGNOSIS — K5900 Constipation, unspecified: Secondary | ICD-10-CM

## 2019-03-26 DIAGNOSIS — J45909 Unspecified asthma, uncomplicated: Secondary | ICD-10-CM | POA: Diagnosis present

## 2019-03-26 DIAGNOSIS — E871 Hypo-osmolality and hyponatremia: Secondary | ICD-10-CM | POA: Diagnosis present

## 2019-03-26 DIAGNOSIS — N179 Acute kidney failure, unspecified: Secondary | ICD-10-CM | POA: Diagnosis present

## 2019-03-26 DIAGNOSIS — J4520 Mild intermittent asthma, uncomplicated: Secondary | ICD-10-CM

## 2019-03-26 DIAGNOSIS — Z87891 Personal history of nicotine dependence: Secondary | ICD-10-CM

## 2019-03-26 DIAGNOSIS — Z833 Family history of diabetes mellitus: Secondary | ICD-10-CM

## 2019-03-26 DIAGNOSIS — I472 Ventricular tachycardia: Secondary | ICD-10-CM

## 2019-03-26 DIAGNOSIS — E11649 Type 2 diabetes mellitus with hypoglycemia without coma: Secondary | ICD-10-CM | POA: Diagnosis present

## 2019-03-26 DIAGNOSIS — Z7951 Long term (current) use of inhaled steroids: Secondary | ICD-10-CM

## 2019-03-26 DIAGNOSIS — E119 Type 2 diabetes mellitus without complications: Secondary | ICD-10-CM

## 2019-03-26 DIAGNOSIS — T8189XA Other complications of procedures, not elsewhere classified, initial encounter: Secondary | ICD-10-CM | POA: Diagnosis present

## 2019-03-26 DIAGNOSIS — L03115 Cellulitis of right lower limb: Secondary | ICD-10-CM | POA: Diagnosis present

## 2019-03-26 DIAGNOSIS — E1122 Type 2 diabetes mellitus with diabetic chronic kidney disease: Secondary | ICD-10-CM

## 2019-03-26 DIAGNOSIS — N183 Chronic kidney disease, stage 3 (moderate): Secondary | ICD-10-CM

## 2019-03-26 DIAGNOSIS — E876 Hypokalemia: Secondary | ICD-10-CM

## 2019-03-26 DIAGNOSIS — J9620 Acute and chronic respiratory failure, unspecified whether with hypoxia or hypercapnia: Secondary | ICD-10-CM | POA: Diagnosis present

## 2019-03-26 DIAGNOSIS — Z6841 Body Mass Index (BMI) 40.0 and over, adult: Secondary | ICD-10-CM

## 2019-03-26 DIAGNOSIS — Z993 Dependence on wheelchair: Secondary | ICD-10-CM

## 2019-03-26 DIAGNOSIS — E861 Hypovolemia: Secondary | ICD-10-CM | POA: Diagnosis present

## 2019-03-26 DIAGNOSIS — M25579 Pain in unspecified ankle and joints of unspecified foot: Secondary | ICD-10-CM | POA: Diagnosis present

## 2019-03-26 DIAGNOSIS — Y838 Other surgical procedures as the cause of abnormal reaction of the patient, or of later complication, without mention of misadventure at the time of the procedure: Secondary | ICD-10-CM | POA: Diagnosis present

## 2019-03-26 LAB — BASIC METABOLIC PANEL
Anion gap: 11 (ref 5–15)
BUN: 37 mg/dL — ABNORMAL HIGH (ref 8–23)
CO2: 26 mmol/L (ref 22–32)
Calcium: 9.3 mg/dL (ref 8.9–10.3)
Chloride: 103 mmol/L (ref 98–111)
Creatinine, Ser: 1.4 mg/dL — ABNORMAL HIGH (ref 0.44–1.00)
GFR calc Af Amer: 46 mL/min — ABNORMAL LOW (ref >60)
GFR calc non Af Amer: 40 mL/min — ABNORMAL LOW (ref >60)
Glucose, Bld: 48 mg/dL — ABNORMAL LOW (ref 70–99)
Potassium: 4 mmol/L (ref 3.5–5.1)
Sodium: 140 mmol/L (ref 135–145)

## 2019-03-26 LAB — CBG MONITORING, ED: Glucose-Capillary: 111 mg/dL — ABNORMAL HIGH (ref 70–99)

## 2019-03-26 LAB — CBC
HCT: 37.2 % (ref 36.0–46.0)
Hemoglobin: 11.7 g/dL — ABNORMAL LOW (ref 12.0–15.0)
MCH: 29.5 pg (ref 26.0–34.0)
MCHC: 31.5 g/dL (ref 30.0–36.0)
MCV: 93.9 fL (ref 80.0–100.0)
Platelets: 240 10*3/uL (ref 150–400)
RBC: 3.96 MIL/uL (ref 3.87–5.11)
RDW: 14.6 % (ref 11.5–15.5)
WBC: 8.5 10*3/uL (ref 4.0–10.5)
nRBC: 0 % (ref 0.0–0.2)

## 2019-03-26 LAB — SARS CORONAVIRUS 2 BY RT PCR (HOSPITAL ORDER, PERFORMED IN ~~LOC~~ HOSPITAL LAB): SARS Coronavirus 2: NEGATIVE

## 2019-03-26 LAB — BRAIN NATRIURETIC PEPTIDE: B Natriuretic Peptide: 85.4 pg/mL (ref 0.0–100.0)

## 2019-03-26 LAB — TROPONIN I (HIGH SENSITIVITY): Troponin I (High Sensitivity): 13 ng/L (ref <18)

## 2019-03-26 MED ORDER — SODIUM CHLORIDE 0.9% FLUSH: 3.0000 mL | Freq: Once | INTRAVENOUS | Status: DC

## 2019-03-26 MED ORDER — DEXTROSE 50 % IV SOLN
1.0000 | Freq: Once | INTRAVENOUS | Status: AC
  Administered 2019-03-26: 50 mL via INTRAVENOUS
  Filled 2019-03-26: qty 50

## 2019-03-26 MED ORDER — FUROSEMIDE 10 MG/ML IJ SOLN
60.0000 mg | Freq: Once | INTRAMUSCULAR | Status: AC
  Administered 2019-03-26: 60 mg via INTRAVENOUS
  Filled 2019-03-26: qty 6

## 2019-03-26 MED ORDER — INSULIN ASPART 100 UNIT/ML ~~LOC~~ SOLN
0.0000 [IU] | SUBCUTANEOUS | Status: DC
  Administered 2019-03-27: 5 [IU] via SUBCUTANEOUS
  Administered 2019-03-27: 2 [IU] via SUBCUTANEOUS
  Administered 2019-03-27 (×2): 5 [IU] via SUBCUTANEOUS
  Administered 2019-03-27 – 2019-03-28 (×3): 2 [IU] via SUBCUTANEOUS
  Administered 2019-03-28: 3 [IU] via SUBCUTANEOUS
  Administered 2019-03-28: 7 [IU] via SUBCUTANEOUS
  Administered 2019-03-28 – 2019-03-29 (×2): 3 [IU] via SUBCUTANEOUS
  Administered 2019-03-29 (×2): 2 [IU] via SUBCUTANEOUS

## 2019-03-26 MED ORDER — GABAPENTIN 300 MG PO CAPS
300.0000 mg | ORAL_CAPSULE | Freq: Once | ORAL | Status: AC
  Administered 2019-03-26: 300 mg via ORAL
  Filled 2019-03-26: qty 1

## 2019-03-26 MED ORDER — FUROSEMIDE 10 MG/ML IJ SOLN: 40.0000 mg | Freq: Two times a day (BID) | INTRAMUSCULAR | Status: DC

## 2019-03-26 NOTE — Telephone Encounter (Signed)
  Pt c/o swelling: STAT is pt has developed SOB within 24 hours  1) How much weight have you gained and in what time span? 20 lbs in 2 days  2) If swelling, where is the swelling located? Legs to feet  3) Are you currently taking a fluid pill? yes  4) Are you currently SOB? Yes, had to start wearing her oxygen during the day   5) Do you have a log of your daily weights (if so, list)? yes  6) Have you gained 3 pounds in a day or 5 pounds in a week? yes  7) Have you traveled recently? no

## 2019-03-26 NOTE — ED Notes (Signed)
Pt with healing right leg skin grafts, wounds and dressing are clean. Redness noted.  She reports pain with sitting but no pain with standing.

## 2019-03-26 NOTE — ED Notes (Signed)
ED TO INPATIENT HANDOFF REPORT  ED Nurse Name and Phone #: Lanora Manislizabeth [phone]  S Name/Age/Gender Felicia BeckmannSharon Rivas 63 y.o. female Room/Bed: 047C/047C  Code Status   Code Status: Prior  Home/SNF/Other Home Patient oriented to: self, place, time and situation Is this baseline? Yes   Triage Complete: Triage complete  Chief Complaint fluid retention  Triage Note Pt c/o shortness of breath and a 25lb weight gain since Saturday this week. Taking lasix, reports she has not missed any doses. Reports that she generally wears O2 at night, and has needed to wear it all the time now. Denies chest pain. Bilateral leg swelling and abdominal distension noted.    Allergies Allergies  Allergen Reactions  . Lisinopril Cough, Nausea And Vomiting and Other (See Comments)    ACE Inhibitor Cough ?Cough - changed to ARB 02/2013. ACE Inhibitor Cough ACE Inhibitor Cough ?Cough - changed to ARB 02/2013. ACE Inhibitor Cough ?Cough - changed to ARB 02/2013.  [name]. Other Hives    Unknown IV antibiotic  . Tramadol     Causes respiratory distress    Level of Care/Admitting Diagnosis ED Disposition    ED Disposition Condition Comment   Admit  [facility] [[id number]]  Level of Care: Telemetry Cardiac [103]  I expect the patient will be discharged within 24 hours: No (not a candidate for 5C-Observation unit)  Covid Evaluation: Asymptomatic Screening Protocol (No Symptoms)  Diagnosis: Acute exacerbation of CHF (congestive heart failure) (HCC) [454098][[id number]]  Admitting Physician: Therisa DoyneUTOVA, ANASTASSIA [[id number]]  Attending Physician: Therisa DoyneUTOVA, ANASTASSIA [[id number]]  PT Class (Do Not Modify): Observation [104]  PT Acc Code (Do Not Modify): Observation [10022]       B Medical/Surgery History Past Medical History:  Diagnosis Date  . Asthma   . Asthma   . CAD (coronary artery disease)    a. NSTEMI 12/12 -> s/p CABGx3.   . Carotid artery disease (HCC)    a. Carotid US (08/2013): R  60-79%; L 40-59%; f/u 6 mos.  . Carotid stenosis    a. Carotid US (08/2013):  R 60-79%; L 40-59%; f/u 6 mos  . Chronic diastolic CHF (congestive heart failure) (HCC)    a. Echo 08/2011: mod LVH, nl EF. b. Echo 01/2013: EF 55-60%, mod-sev LVH, mod-sev LA dilitation  . Chronic diastolic heart failure (HCC)    a. Echo 08/2011: mod LVH, nl EF. b. Echo 01/2013: EF 55-60%, mod-sev LVH, mod-sev LA dilitation.  . Complication of anesthesia    '' Myself & my family wake up during surgery "  . Complication of anesthesia   . Coronary artery disease    a. NSTEMI 12/12 -> s/p CABGx3.  [name]. Cough   . Cough    a. 2014: Suspect a combination of CHF, GERD and ACE inhibitor-induced cough.  . Diabetes mellitus   . Diabetes mellitus (HCC)   . History of chicken pox   . HLD (hyperlipidemia)   . HTN (hypertension)   . Hyperlipidemia   . Hypertension   . Morbid obesity (HCC)   . Myocardial infarction (HCC)   . PONV (postoperative nausea and vomiting)   . S/P CABG (coronary artery bypass graft)    a. 2012 - Dr. Laneta SimmersBartle;  LIMA-LAD, SVG-OM 2, SVG-PDA.  . S/P CABG x 3 09/07/11   Dr. Laneta SimmersBartle;   LIMA-LAD, SVG-OM 2, SVG-PDA.  . Sleep disturbance    a. Had sleep study several years ago, wasn't told she has OSA but told she needs O2 at night.  .[name]  Sleep disturbance    Had sleep study several years ago, wasn't told she has OSA but told she needs O2 at night  . Stroke ([place])   . Stroke [facility]) 2006   denies residual  . TIA (transient ischemic attack)    Past Surgical History:  Procedure Laterality Date  . CAROTID ANGIOGRAPHY N/A 03/17/2017   Procedure: Carotid Angiography;  Surgeon: Elam Dutch, MD;  Location: [place] CV [facility];  Service: Cardiovascular;  Laterality: N/A;  . CAROTID PTA/STENT INTERVENTION Right 03/29/2017   Procedure: Carotid PTA/Stent Intervention;  Surgeon: Elam Dutch, MD;  Location: [place] [facility];  Service: Cardiovascular;  Laterality: Right;  . CHOLECYSTECTOMY    .  CHOLECYSTECTOMY  1990's  . CORONARY ARTERY BYPASS GRAFT    . CORONARY ARTERY BYPASS GRAFT  09/07/2011   CABG X3; Procedure: CORONARY ARTERY BYPASS GRAFTING (CABG);  Surgeon: Gaye Pollack, MD;  Location: [place];  Service: Open Heart Surgery;  Laterality: N/A;  . LEFT HEART CATHETERIZATION WITH CORONARY ANGIOGRAM N/A 09/02/2011   Procedure: LEFT HEART CATHETERIZATION WITH CORONARY ANGIOGRAM;  Surgeon: Hillary Bow, MD;  Location: [facility] CATH LAB;  Service: Cardiovascular;  Laterality: N/A;  . TUBAL LIGATION  1981     A IV Location/Drains/Wounds Patient Lines/Drains/Airways Status   Active Line/Drains/Airways    Name:   Placement date:   Placement time:   Site:   Days:   Peripheral IV 12/14/18 Right Wrist   12/14/18    0915    Wrist   102   Peripheral IV 03/26/19 Left Antecubital   03/26/19    1952    Antecubital   less than 1   Pressure Injury 12/14/18 Unstageable - Full thickness tissue loss in which the base of the ulcer is covered by slough (yellow, tan, gray, green or brown) and/or eschar (tan, brown or black) in the wound bed.   12/14/18    2001     102   Wound / Incision (Open or Dehisced) 12/14/18 Incision - Open Ankle Right unhealed incison from previous surgery that has become infected   12/14/18    2001    Ankle   102          Intake/Output Last 24 hours No intake or output data in the 24 hours ending 03/26/19 2259  Labs/Imaging Results for orders placed or performed during the hospital encounter of 03/26/19 (from the past 48 hour(s))  Basic metabolic panel     Status: Abnormal   Collection Time: 03/26/19  4:45 PM  Result Value Ref Range   Sodium 140 135 - 145 mmol/L   Potassium 4.0 3.5 - 5.1 mmol/L   Chloride 103 98 - 111 mmol/L   CO2 26 22 - 32 mmol/L   Glucose, Bld 48 (L) 70 - 99 mg/dL   BUN 37 (H) 8 - 23 mg/dL   Creatinine, Ser 1.40 (H) 0.44 - 1.00 mg/dL   Calcium 9.3 8.9 - 10.3 mg/dL   GFR calc non Af Amer 40 (L) >60 mL/min   GFR calc Af Amer 46 (L) >60 mL/min    Anion gap 11 5 - 15    Comment: Performed at [facility], [address] [place] [address]  CBC     Status: Abnormal   Collection Time: 03/26/19  4:45 PM  Result Value Ref Range   WBC 8.5 4.0 - 10.5 K/uL   RBC 3.96 3.87 - 5.11 MIL/uL   Hemoglobin 11.7 (L) 12.0 -  15.0 g/dL   HCT 19.137.2 47.836.0 - 29.546.0 %   MCV 93.9 80.0 - 100.0 fL   MCH 29.5 26.0 - 34.0 pg   MCHC 31.5 30.0 - 36.0 g/dL   RDW 62.114.6 30.811.5 - 65.715.5 %   Platelets 240 150 - 400 K/uL   nRBC 0.0 0.0 - 0.2 %    Comment: Performed at [facility], [address] [place], [place] [address]  Brain natriuretic peptide     Status: None   Collection Time: 03/26/19  6:26 PM  Result Value Ref Range   B Natriuretic Peptide 85.4 0.0 - 100.0 pg/mL    Comment: Performed at [facility], [address] [place], [place] [address]  Troponin I (High Sensitivity)     Status: None   Collection Time: 03/26/19  7:53 PM  Result Value Ref Range   Troponin I (High Sensitivity) 13 <18 ng/L    Comment: (NOTE) Elevated high sensitivity troponin I (hsTnI) values and significant  changes across serial measurements may suggest ACS but many other  chronic and acute conditions are known to elevate hsTnI results.  Refer to the "Links" section for chest pain algorithms and additional  guidance. Performed at [facility], [address] [place], [place] [address]   SARS Coronavirus 2 (CEPHEID - Performed in [facility] hospital lab), Hosp Order     Status: None   Collection Time: 03/26/19  9:04 PM   Specimen: Nasopharyngeal Swab  Result Value Ref Range   SARS Coronavirus 2 NEGATIVE NEGATIVE    Comment: (NOTE) If result is NEGATIVE SARS-CoV-2 target nucleic acids are NOT DETECTED. The SARS-CoV-2 RNA is generally detectable in upper and lower  respiratory specimens during the acute phase of infection. The lowest  concentration of SARS-CoV-2 viral copies this assay can detect is 250  copies / mL. A negative result does not  preclude SARS-CoV-2 infection  and should not be used as the sole basis for treatment or other  patient management decisions.  A negative result may occur with  improper specimen collection / handling, submission of specimen other  than nasopharyngeal swab, presence of viral mutation(s) within the  areas targeted by this assay, and inadequate number of viral copies  (<250 copies / mL). A negative result must be combined with clinical  observations, patient history, and epidemiological information. If result is POSITIVE SARS-CoV-2 target nucleic acids are DETECTED. The SARS-CoV-2 RNA is generally detectable in upper and lower  respiratory specimens dur ing the acute phase of infection.  Positive  results are indicative of active infection with SARS-CoV-2.  Clinical  correlation with patient history and other diagnostic information is  necessary to determine patient infection status.  Positive results do  not rule out bacterial infection or co-infection with other viruses. If result is PRESUMPTIVE POSTIVE SARS-CoV-2 nucleic acids MAY BE PRESENT.   A presumptive positive result was obtained on the submitted specimen  and confirmed on repeat testing.  While 2019 novel coronavirus  (SARS-CoV-2) nucleic acids may be present in the submitted sample  additional confirmatory testing may be necessary for epidemiological  and / or clinical management purposes  to differentiate between  SARS-CoV-2 and other Sarbecovirus currently known to infect humans.  If clinically indicated additional testing with an alternate test  methodology [phone] is advised. The SARS-CoV-2 RNA is generally  detectable in upper and lower respiratory sp ecimens during the acute  phase of infection. The expected result is Negative. Fact Sheet for  Patients:  BoilerBrush.com.cyhttps://www.fda.gov/media/136312/download Fact Sheet for Healthcare Providers: https://pope.com/https://www.fda.gov/media/136313/download This test is not yet approved or cleared by  the [place] FDA and has been authorized for detection and/or diagnosis of SARS-CoV-2 by FDA under an Emergency Use Authorization (EUA).  This EUA will remain in effect (meaning this test can be used) for the duration of the COVID-19 declaration under Section 564(b)(1) of the Act, 21 U.S.C. section 360bbb-3(b)(1), unless the authorization is terminated or revoked sooner. Performed at [facility], [address] [place], [place] [address]   POC CBG, ED     Status: Abnormal   Collection Time: 03/26/19 10:05 PM  Result Value Ref Range   Glucose-Capillary 111 (H) 70 - 99 mg/dL   Dg Chest 2 View  Result Date: 03/26/2019 CLINICAL DATA:  Shortness of breath EXAM: CHEST - 2 VIEW COMPARISON:  11/11/2014 FINDINGS: Shallow lung inflation with mild cardiomegaly. Remote median sternotomy. No focal airspace consolidation or pulmonary edema. No pleural effusion or pneumothorax. IMPRESSION: No active cardiopulmonary disease. Electronically Signed   By: Deatra RobinsonKevin  Herman M.D.   On: 03/26/2019 17:22   Dg Abd 1 View  Result Date: 03/26/2019 CLINICAL DATA:  Chronic constipation. EXAM: ABDOMEN - 1 VIEW COMPARISON:  None. FINDINGS: The bowel gas pattern is normal. No radio-opaque calculi or other significant radiographic abnormality are seen. There is a large stool burden. There are degenerative changes throughout the lumbar spine and bilateral hips. IMPRESSION: Large stool burden. Electronically Signed   By: Katherine Mantlehristopher  Green M.D.   On: 03/26/2019 22:54    Pending Labs Unresulted Labs (From admission, onward)    Start     Ordered   03/26/19 2201  D-dimer, quantitative (not at [facility])  Add-on,   AD     03/26/19 2200   03/26/19 1914  Troponin I (High Sensitivity)  STAT Now then every 2 hours,   STAT     03/26/19 1913   Signed and Held  Hemoglobin A1c  Tomorrow morning,   R    Comments: To assess prior glycemic control    Signed and Held          Vitals/Pain Today's Vitals   03/26/19 2000  03/26/19 2100 03/26/19 2100 03/26/19 2200  BP:  139/70    Pulse:  63    Resp:      Temp:      TempSrc:      SpO2:  99%    Weight:      Height:      PainSc: 10-Worst pain ever  10-Worst pain ever 10-Worst pain ever    Isolation Precautions No active isolations  Medications Medications  sodium chloride flush (NS) 0.9 % injection 3 mL (3 mLs Intravenous Not Given 03/26/19 2047)  furosemide (LASIX) injection 60 mg (60 mg Intravenous Given 03/26/19 2055)  gabapentin (NEURONTIN) capsule 300 mg (300 mg Oral Given 03/26/19 2103)  dextrose 50 % solution 50 mL (50 mLs Intravenous Given 03/26/19 2052)    Mobility walks Low fall risk   Focused Assessments Pulmonary Assessment Handoff:  Lung sounds:   O2 Device: Nasal Cannula O2 Flow Rate (L/min): 2.5 L/min      R Recommendations: See Admitting Provider Note  Report given to:   Additional Notes:

## 2019-03-26 NOTE — ED Notes (Signed)
ED Provider at bedside. 

## 2019-03-26 NOTE — ED Triage Notes (Signed)
Pt c/o shortness of breath and a 25lb weight gain since Saturday this week. Taking lasix, reports she has not missed any doses. Reports that she generally wears O2 at night, and has needed to wear it all the time now. Denies chest pain. Bilateral leg swelling and abdominal distension noted.

## 2019-03-26 NOTE — H&P (Addendum)
Felicia Rivas ZOX:[id number] DOB: July 04, 1956 DOA: 03/26/2019     PCP: [name], MD   Outpatient Specialists:   CARDS:  .Dr Etter Sjogren, [place]  Patient arrived to ER on 03/26/19 at 1635  Patient coming from: home Lives   With family daughter    Chief Complaint:  Chief Complaint  Patient presents with  . Shortness of Breath    HPI: Felicia Rivas is a 63 y.o. female with medical history significant of CAD sp CABG, asthma, diastolic CHF, DM 2, HTN, HLD, CVA chronic   diastolic  CHF, on 2L of O2 as needed    Presented with  Shortness of breath Gained 25 lb in the past 4 days , patient endorsed some swelling in her lower extremities she is currently on Lasix 20 mg twice a day and states she has been compliant she usually on 2 L as needed but started to use oxygen around-the-clock because she was more short of breath. No recent travel history Patient called her cardiologist who recommended to come to emergency department to be further evaluated  Endorsing that her Lasix did not make her urinate as much as it used to and she has been somewhat constipated Been having more abdominal distention than usual. 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  Patient reports significant thigh pain anteriorly when she sits for too long it improves when she tries to stand up she experienced feeling this for the past few days Noticed a increased lower extremity swelling States she has trouble lifting her legs up and usually sits with her legs dangling down  Infectious risk factors:  Reports shortness of breath,   In  ER RAPID COVID TEST NEGATIVE    Regarding pertinent Chronic problems:    Hyperlipidemia -  on statins Lipitor   HTN on metoprolol    CHF diastolic - last echo 2016 In 2019 Myo view EF 47% On Lasix    CAD  - On Aspirin, statin, betablocker, Plavix                 - followed by cardiology                - last Myo 08/22/2018  unchanged Nuclear stress EF:  47%. This is an intermediate risk study.   DM 2 -  Lab Results  Component Value Date   HGBA1C 5.8 (H) 03/27/2019   on insulin,       Morbid obesity-   BMI Readings from Last 1 Encounters:  03/26/19 53.91 kg/m       Asthma -well   controlled on home inhalers                        Hx of CVA -  With out residual deficits on Aspirin 81 mg,   Plavix     CKD stage III - baseline Cr 1.3   While in ER: Noted to by hypoglycemic ECG new LBBB  The following Work up has been ordered so far:  Orders Placed This Encounter  Procedures  . SARS Coronavirus 2 (CEPHEID - Performed in [facility] hospital lab), [facility]  . DG Chest 2 View  . DG Abd 1 View  . Basic metabolic panel  . CBC  . Brain natriuretic peptide  . Troponin I (High Sensitivity)  . D-dimer, quantitative (not at [facility])  . Hemoglobin A1c  . Glucose, capillary  . Magnesium  . Phosphorus  .  TSH  . Comprehensive metabolic panel  . CBC  . Diet heart healthy/carb modified Room service appropriate? Yes; Fluid consistency: Thin  . Cardiac monitoring  . Saline Lock IV, Maintain IV access  . Cardiac monitoring  . STAT CBG when hypoglycemia is suspected. If treated, recheck every 15 minutes after each treatment until CBG >/= 70 mg/dl  . Refer to Hypoglycemia Protocol Sidebar Report for treatment of CBG < 70 mg/dl  . Notify physician (specify)  . Activity per Heart Failure Clinical Pathway  . Initiate Heart Failure clinical pathway  . Daily weights  . Strict intake and output  . In and Out Cath  . Patient education (specify):  . Cardiac Monitoring Continuous x 24 hours Indications for use: Sub-acute heart failure  . No ACE Inhibitor / ARB  . Vital signs  . Notify physician  . Up with assistance  . If patient diabetic or glucose greater than 140 notify physician for Sliding Scale Insulin Orders  . May go off telemetry for tests/procedures  . Oral care per nursing protocol  . Initiate Oral Care Protocol  .  Initiate Carrier Fluid Protocol  . RN may order General Admission PRN Orders utilizing "General Admission PRN medications" (through manage orders) for the following patient needs: allergy symptoms (Claritin), cold sores (Carmex), cough (Robitussin DM), eye irritation (Liquifilm Tears), hemorrhoids (Tucks), indigestion (Maalox), minor skin irritation (Hydrocortisone Cream), muscle pain Romeo Apple Gay), nose irritation (saline nasal spray) and sore throat (Chloraseptic spray).  . Patient has an active order for admit to inpatient/place in observation  . Cardiac Monitoring Continuous x 24 hours Indications for use: Sub-acute heart failure  . Full code  . Consult for [facility] Admission  [phone]  . Consult to case management  . Consult to wound, ostomy, continence  . OT eval and treat  . PT eval and treat  . Pulse oximetry, continuous  . Oxygen therapy Mode or (Route): Nasal cannula; Keep 02 saturation: >/= 92%  . Pulse oximetry check with vital signs  . Oxygen therapy Mode or (Route): Nasal cannula; Liters Per Minute: 2; Keep 02 saturation: greater than 92 %  . Incentive spirometry  . POC CBG, ED  . ED EKG  . EKG 12-Lead  . EKG 12-Lead  . EKG 12-Lead  . ECHOCARDIOGRAM COMPLETE  . Insert peripheral IV  . Place in observation (patient's expected length of stay will be less than 2 midnights)     Following Medications were ordered in ER: Medications  sodium chloride flush (NS) 0.9 % injection 3 mL (3 mLs Intravenous Not Given 03/26/19 2047)  insulin aspart (novoLOG) injection 0-9 Units (0 Units Subcutaneous Not Given 03/27/19 0012)  furosemide (LASIX) injection 40 mg (has no administration in time range)  HYDROcodone-acetaminophen (NORCO/VICODIN) 5-325 MG per tablet 1-2 tablet (has no administration in time range)  aspirin EC tablet 325 mg (has no administration in time range)  atorvastatin (LIPITOR) tablet 80 mg (has no administration in time range)  metoprolol succinate (TOPROL-XL) 24 hr  tablet 25 mg (has no administration in time range)  insulin regular human CONCENTRATED (HUMULIN R) 500 UNIT/ML injection 10-20 Units (has no administration in time range)  clopidogrel (PLAVIX) tablet 75 mg (has no administration in time range)  gabapentin (NEURONTIN) capsule 600 mg (has no administration in time range)  potassium chloride SA (K-DUR) CR tablet 40 mEq (has no administration in time range)  albuterol (VENTOLIN HFA) 108 (90 Base) MCG/ACT inhaler 2 puff (has no administration in time range)  mometasone-formoterol (  DULERA) 200-5 MCG/ACT inhaler 2 puff (has no administration in time range)  acetaminophen (TYLENOL) tablet 650 mg (has no administration in time range)    Or  acetaminophen (TYLENOL) suppository 650 mg (has no administration in time range)  ondansetron (ZOFRAN) tablet 4 mg (has no administration in time range)    Or  ondansetron (ZOFRAN) injection 4 mg (has no administration in time range)  enoxaparin (LOVENOX) injection 30 mg (has no administration in time range)  sodium chloride flush (NS) 0.9 % injection 3 mL (has no administration in time range)  sodium chloride flush (NS) 0.9 % injection 3 mL (has no administration in time range)  0.9 %  sodium chloride infusion (has no administration in time range)  oxyCODONE (Oxy IR/ROXICODONE) immediate release tablet 5-10 mg (has no administration in time range)  polyethylene glycol (MIRALAX / GLYCOLAX) packet 17 g (has no administration in time range)  senna (SENOKOT) tablet 8.6 mg (has no administration in time range)  bisacodyl (DULCOLAX) suppository 10 mg (has no administration in time range)  furosemide (LASIX) injection 60 mg (60 mg Intravenous Given 03/26/19 2055)  gabapentin (NEURONTIN) capsule 300 mg (300 mg Oral Given 03/26/19 2103)  dextrose 50 % solution 50 mL (50 mLs Intravenous Given 03/26/19 2052)       Consult Orders  (From admission, onward)         Start     Ordered   03/27/19 0113  OT eval and treat   Routine    Question:  Reason for OT?  Answer:  debility, mobility issues   03/27/19 0116   03/27/19 0113  Consult to wound, ostomy, continence  Once    Provider:  (Not yet assigned)  Question:  Reason for Consult?  Answer:  right leg wound   03/27/19 0116   03/27/19 0112  PT eval and treat  Routine     03/27/19 0116   03/26/19 2357  Consult to case management  Once    Comments: Heart failure home health screen and may place order for PT/OT eval and treat if indicated.  Provider:  (Not yet assigned)  Question:  Reason for consult:  Answer:  Other (see comments)   03/26/19 2356           Significant initial  Findings: Abnormal Labs Reviewed  BASIC METABOLIC PANEL - Abnormal; Notable for the following components:      Result Value   Glucose, Bld 48 (*)    BUN 37 (*)    Creatinine, Ser 1.40 (*)    GFR calc non Af Amer 40 (*)    GFR calc Af Amer 46 (*)    All other components within normal limits  CBC - Abnormal; Notable for the following components:   Hemoglobin 11.7 (*)    All other components within normal limits  D-DIMER, QUANTITATIVE (NOT AT [facility]) - Abnormal; Notable for the following components:   D-Dimer, Quant 1.12 (*)    All other components within normal limits  HEMOGLOBIN A1C - Abnormal; Notable for the following components:   Hgb A1c MFr Bld 5.8 (*)    All other components within normal limits  CBG MONITORING, ED - Abnormal; Notable for the following components:   Glucose-Capillary 111 (*)    All other components within normal limits     Otherwise labs showing:    Recent Labs  Lab 03/26/19 1645  NA 140  K 4.0  CO2 26  GLUCOSE 48*  BUN 37*  CREATININE 1.40*  CALCIUM 9.3  Cr  stable,   Lab Results  Component Value Date   CREATININE 1.40 (H) 03/26/2019   CREATININE 1.11 (H) 02/04/2019   CREATININE 1.80 (H) 12/15/2018    No results for input(s): AST, ALT, ALKPHOS, BILITOT, PROT, ALBUMIN in the last 168 hours. Lab Results  Component Value Date    CALCIUM 9.3 03/26/2019      WBC      Component Value Date/Time   WBC 8.5 03/26/2019 1645   ANC    Component Value Date/Time   NEUTROABS 11.4 (H) 12/14/2018 0912      Plt: Lab Results  Component Value Date   PLT 240 03/26/2019     Lactic Acid, Venous    Component Value Date/Time   LATICACIDVEN 1.9 12/14/2018 0912      COVID-19 Labs    Lab Results  Component Value Date   SARSCOV2NAA NEGATIVE 03/26/2019        HG/HCT  Stable,     Component Value Date/Time   HGB 11.7 (L) 03/26/2019 1645   HCT 37.2 03/26/2019 1645    Troponin  13 Cardiac Panel (last 3 results) No results for input(s): CKTOTAL, CKMB, TROPONINI, RELINDX in the last 72 hours.    BNP (last 3 results) Recent Labs    03/26/19 1826  BNP 85.4    ProBNP (last 3 results) Recent Labs    07/17/18 1123  PROBNP 399*    DM  labs:  HbA1C: Recent Labs    03/27/19 0016  HGBA1C 5.8*       CBG (last 3)  Recent Labs    03/26/19 2205 03/26/19 2359  GLUCAP 111* 94       UA  not ordered    CXR -  NON acute    ECG:  Personally reviewed by me showing: HR : 70 Rhythm:   LBBB,    no evidence of ischemic changes QTC 494     ED Triage Vitals  Enc Vitals Group     BP 03/26/19 1641 (!) 156/70     Pulse Rate 03/26/19 1641 66     Resp 03/26/19 1641 18     Temp 03/26/19 1641 97.8 F (36.6 C)     Temp Source 03/26/19 1641 Oral     SpO2 03/26/19 1641 99 %     Weight 03/26/19 1808 (!) 334 lb (151.5 kg)     Height 03/26/19 1808  (1.676 m)     Head Circumference --      Peak Flow --      Pain Score 03/26/19 1643 6     Pain Loc --      Pain Edu? --      Excl. in GC? --   TMAX(24)@       Latest  Blood pressure 106/79, pulse 66, temperature 97.8 F (36.6 C), temperature source Oral, resp. rate 15, height  (1.676 m), weight (!) 151.5 kg, SpO2 97 %.    Hospitalist was called for admission for Fluid oveload   Review of Systems:    Pertinent positives include: shortness  of breath at rest.  dyspnea on exertion,  Constitutional:  No weight loss, night sweats, Fevers, chills, fatigue, weight loss  HEENT:  No headaches, Difficulty swallowing,Tooth/dental problems,Sore throat,  No sneezing, itching, ear ache, nasal congestion, post nasal drip,  Cardio-vascular:  No chest pain, Orthopnea, PND, anasarca, dizziness, palpitations.no Bilateral lower extremity swelling  GI:  No heartburn, indigestion, abdominal pain, nausea, vomiting, diarrhea, change in bowel habits, loss of  appetite, melena, blood in stool, hematemesis Resp:  no  No excess mucus, no productive cough, No non-productive cough, No coughing up of blood.No change in color of mucus.No wheezing. Skin:  no rash or lesions. No jaundice GU:  no dysuria, change in color of urine, no urgency or frequency. No straining to urinate.  No flank pain.  Musculoskeletal:  No joint pain or no joint swelling. No decreased range of motion. No back pain.  Psych:  No change in mood or affect. No depression or anxiety. No memory loss.  Neuro: no localizing neurological complaints, no tingling, no weakness, no double vision, no gait abnormality, no slurred speech, no confusion  All systems reviewed and apart from HOPI all are negative  Past Medical History:   Past Medical History:  Diagnosis Date  . Asthma   . Asthma   . CAD (coronary artery disease)    a. NSTEMI 12/12 -> s/p CABGx3.   . Carotid artery disease (HCC)    a. Carotid US (08/2013): R 60-79%; L 40-59%; f/u 6 mos.  . Carotid stenosis    a. Carotid US (08/2013):  R 60-79%; L 40-59%; f/u 6 mos  . Chronic diastolic CHF (congestive heart failure) (HCC)    a. Echo 08/2011: mod LVH, nl EF. b. Echo 01/2013: EF 55-60%, mod-sev LVH, mod-sev LA dilitation  . Chronic diastolic heart failure (HCC)    a. Echo 08/2011: mod LVH, nl EF. b. Echo 01/2013: EF 55-60%, mod-sev LVH, mod-sev LA dilitation.  . Complication of anesthesia    '' Myself & my family wake up  during surgery "  . Complication of anesthesia   . Coronary artery disease    a. NSTEMI 12/12 -> s/p CABGx3.  [name] Cough   . Cough    a. 2014: Suspect a combination of CHF, GERD and ACE inhibitor-induced cough.  . Diabetes mellitus   . Diabetes mellitus (HCC)   . History of chicken pox   . HLD (hyperlipidemia)   . HTN (hypertension)   . Hyperlipidemia   . Hypertension   . Morbid obesity (HCC)   . Myocardial infarction (HCC)   . PONV (postoperative nausea and vomiting)   . S/P CABG (coronary artery bypass graft)    a. 2012 - Dr. Laneta Simmers;  LIMA-LAD, SVG-OM 2, SVG-PDA.  . S/P CABG x 3 09/07/11   Dr. Laneta Simmers;   LIMA-LAD, SVG-OM 2, SVG-PDA.  . Sleep disturbance    a. Had sleep study several years ago, wasn't told she has OSA but told she needs O2 at night.  . Sleep disturbance    Had sleep study several years ago, wasn't told she has OSA but told she needs O2 at night  . Stroke (HCC)   . Stroke [facility] 2006   denies residual  . TIA (transient ischemic attack)      Past Surgical History:  Procedure Laterality Date  . CAROTID ANGIOGRAPHY N/A 03/17/2017   Procedure: Carotid Angiography;  Surgeon: Sherren Kerns, MD;  Location: [facility] INVASIVE CV LAB;  Service: Cardiovascular;  Laterality: N/A;  . CAROTID PTA/STENT INTERVENTION Right 03/29/2017   Procedure: Carotid PTA/Stent Intervention;  Surgeon: Sherren Kerns, MD;  Location: [facility] INVASIVE CV LAB;  Service: Cardiovascular;  Laterality: Right;  . CHOLECYSTECTOMY    . CHOLECYSTECTOMY  1990's  . CORONARY ARTERY BYPASS GRAFT    . CORONARY ARTERY BYPASS GRAFT  09/07/2011   CABG X3; Procedure: CORONARY ARTERY BYPASS GRAFTING (CABG);  Surgeon: Alleen Borne, MD;  Location: [facility] OR;  Service: Open Heart Surgery;  Laterality: N/A;  . LEFT HEART CATHETERIZATION WITH CORONARY ANGIOGRAM N/A 09/02/2011   Procedure: LEFT HEART CATHETERIZATION WITH CORONARY ANGIOGRAM;  Surgeon: Hillary Bow, MD;  Location: [facility] CATH LAB;  Service: Cardiovascular;   Laterality: N/A;  . TUBAL LIGATION  1981    Social History:  Ambulatory  wheelchair bound,      reports that she quit smoking about 7 years ago. Her smoking use included cigarettes. She has a 12.50 pack-year smoking history. She has quit using smokeless tobacco. She reports that she does not drink alcohol or use drugs.   Family History:   Family History  Problem Relation Age of Onset  . Cancer Mother   . Heart failure Mother        Also has hx MVP s/p MVR  . Diabetes Mother   . Coronary artery disease Father        Died of MI at age 23  . Heart disease Other        Parent    Allergies: Allergies  Allergen Reactions  . Lisinopril Cough, Nausea And Vomiting and Other (See Comments)    ACE Inhibitor Cough ?Cough - changed to ARB 02/2013. ACE Inhibitor Cough ACE Inhibitor Cough ?Cough - changed to ARB 02/2013. ACE Inhibitor Cough ?Cough - changed to ARB 02/2013.  [name] Other Hives    Unknown IV antibiotic  . Tramadol     Causes respiratory distress     Prior to Admission medications   Medication Sig Start Date End Date Taking? Authorizing Provider  albuterol (PROVENTIL HFA;VENTOLIN HFA) 108 (90 BASE) MCG/ACT inhaler Inhale 2 puffs into the lungs every 6 (six) hours as needed for wheezing or shortness of breath. 11/11/14   Charlie Pitter, PA-C  aspirin EC 81 MG tablet Take 1 tablet (81 mg total) by mouth daily. 07/17/18   Erlene Quan, PA-C  atorvastatin (LIPITOR) 80 MG tablet Take 1 tablet (80 mg total) by mouth daily. 07/17/18   Erlene Quan, PA-C  budesonide-formoterol (SYMBICORT) 160-4.5 MCG/ACT inhaler Inhale 2 puffs into the lungs every 12 (twelve) hours. 10/22/18   [provider]  clopidogrel (PLAVIX) 75 MG tablet Take 1 tablet (75 mg total) by mouth daily. 02/01/19   Elam Dutch, MD  collagenase (SANTYL) ointment Apply topically daily. 12/15/18   Kathi Ludwig, MD  doxycycline (VIBRA-TABS) 100 MG tablet Take 1 tablet (100 mg total) by mouth 2 (two)  times daily. Patient not taking: Reported on 01/18/2019 12/15/18   Kathi Ludwig, MD  furosemide (LASIX) 40 MG tablet Take 3 tablets (120 mg total) by mouth 2 (two) times daily. 02/04/19   Lelon Perla, MD  gabapentin (NEURONTIN) 300 MG capsule Take 600 mg by mouth 3 (three) times daily.     [provider]  glucose blood (FREESTYLE LITE) test strip Check bg 3 times/day 11/19/15   [provider]  glucose blood (PRECISION QID TEST) test strip Check bg 3 times/day 11/19/15   [provider]  glucose blood test strip Check bg 3 times/day 11/19/15   [provider]  insulin regular human CONCENTRATED (HUMULIN R) 500 UNIT/ML injection Inject 12-20 Units into the skin See admin instructions. Inject 20 units in the morning (8AM) and 12 units at night (10PM) with additional as needed. *Max daily dosage 65units/day 02/20/17   [provider]  Liraglutide 18 MG/3ML SOPN Inject 1.8 mg into the skin daily.    [provider]  metoprolol succinate (TOPROL-XL)  25 MG 24 hr tablet Take 1 tablet (25 mg total) by mouth daily. 07/17/18   Lewayne Buntingrenshaw, Brian S, MD  mupirocin cream (BACTROBAN) 2 % Apply 1 application topically 3 (three) times daily. Wound care 12/05/18 12/05/19  [provider]  OXYGEN Inhale 2 L/day into the lungs.    [provider]  potassium chloride SA (K-DUR,KLOR-CON) 20 MEQ tablet Take 2 tablets (40 mEq total) by mouth daily. 09/07/18   Lewayne Buntingrenshaw, Brian S, MD  Vitamin D, Ergocalciferol, (DRISDOL) 1.25 MG (50000 UT) CAPS capsule Take 50,000 Units by mouth every Sunday. 11/09/18   [provider]   Physical Exam: Blood pressure 106/79, pulse 66, temperature 97.8 F (36.6 C), temperature source Oral, resp. rate 15, height 5\' 6"  (1.676 m), weight (!) 151.5 kg, SpO2 97 %. 1. General:  in No  Acute distress   Chronically ill  -appearing 2. Psychological: Alert and  Oriented 3. Head/ENT:   Moist  Mucous Membranes                           Head Non traumatic, neck supple                    Poor Dentition 4. SKIN: normal   Skin turgor,  Skin clean Dry and intact no rash 5. Heart: Regular rate and rhythm no Murmur, no Rub or gallop 6. Lungs:   no wheezes or crackles  distant  7. Abdomen: Soft,  non-tender,  distended   Obese bowel sounds present 8. Lower extremities: no clubbing, cyanosis,  Trace edema 9. Neurologically Grossly intact, moving all 4 extremities equally   10. MSK: Normal range of motion   All other LABS:     Recent Labs  Lab 03/26/19 1645  WBC 8.5  HGB 11.7*  HCT 37.2  MCV 93.9  PLT 240     Recent Labs  Lab 03/26/19 1645  NA 140  K 4.0  CL 103  CO2 26  GLUCOSE 48*  BUN 37*  CREATININE 1.40*  CALCIUM 9.3     No results for input(s): AST, ALT, ALKPHOS, BILITOT, PROT, ALBUMIN in the last 168 hours.     Cultures:    Component Value Date/Time   SDES URINE, RANDOM 12/14/2018 2215   SPECREQUEST NONE 12/14/2018 2215   CULT  12/14/2018 2215    NO GROWTH Performed at [facility], [address] [place], [place] [address]    REPTSTATUS 12/16/2018 FINAL 12/14/2018 2215     Radiological Exams on Admission: Dg Chest 2 View  Result Date: 03/26/2019 CLINICAL DATA:  Shortness of breath EXAM: CHEST - 2 VIEW COMPARISON:  11/11/2014 FINDINGS: Shallow lung inflation with mild cardiomegaly. Remote median sternotomy. No focal airspace consolidation or pulmonary edema. No pleural effusion or pneumothorax. IMPRESSION: No active cardiopulmonary disease. Electronically Signed   By: Deatra RobinsonKevin  Herman M.D.   On: 03/26/2019 17:22   Dg Abd 1 View  Result Date: 03/26/2019 CLINICAL DATA:  Chronic constipation. EXAM: ABDOMEN - 1 VIEW COMPARISON:  None. FINDINGS: The bowel gas pattern is normal. No radio-opaque calculi or other significant radiographic abnormality are seen. There is a large stool burden. There are degenerative changes throughout the lumbar spine and bilateral hips. IMPRESSION: Large  stool burden. Electronically Signed   By: Katherine Mantlehristopher  Green M.D.   On: 03/26/2019 22:54    Chart has been reviewed    Assessment/Plan  63 y.o. female with medical history significant of  CAD sp CABG, asthma, diastolic CHF, DM 2, HTN, HLD, CVA chronic   diastolic  CHF, on 2L of O2 as needed  Admitted for CHF exacerbation  Present on Admission: . Acute on chronic diastolic CHF (congestive heart failure) (HCC -  - admit on telemetry,  cycle cardiac enzymes,   obtain serial ECG  to evaluate for ischemia as a cause of heart failure  monitor daily weight:  Filed Weights   03/26/19 1808  Weight: (!) 151.5 kg   Last BNP BNP (last 3 results) Recent Labs    03/26/19 1826  BNP 85.4    ProBNP (last 3 results) Recent Labs    07/17/18 1123  PROBNP 399*     diurese with IV lasix and monitor orthostatics and creatinine to avoid over diuresis.  Order echogram to evaluate EF and valves  ACE/ARBi     Contraindicated given CKD   cardiology emailed    Acute on chronic respiratory failure with hypoxia likely will need oxygen around the clock at discharge, will diurese and see in improves   . Asthma - chronic stable  . Morbid obesity (HCC) - needs nutrition follow up as an outpatient  ) Dm 2-  - Order Sensitive   SSI  Given hypoglycemia episode in the emergency department with blood sugar down to 48 will hold tonight's dose of insulin and decrease tomorrow's dose to 50 units twice daily order Diabetic coordinator consult to help with management  -  check TSH and HgA1C   Constipation - ordered KUB with significant constipation will order bowel regimen and PRN enema   Right lower extremity wound history of fracture resulting in poor mobility will order wound consult will probably benefit from PT OT assessment debility and reduced mobility  Other plan as per orders.  DVT prophylaxis:   Lovenox     Code Status:  FULL CODE  as per patient   I had personally discussed CODE STATUS with  patient    Family Communication:   Family not at  Bedside    Disposition Plan:     To home once workup is complete and patient is stable                     Would benefit from PT/OT eval prior to DC  Ordered                       Wound care  consulted                  Consults called:  email cardiology    Admission status:  ED Disposition    ED Disposition Condition Comment   Admit  Hospital Area: [facility] [[id number]]  Level of Care: Telemetry Cardiac [103]  I expect the patient will be discharged within 24 hours: No (not a candidate for 5C-Observation unit)  Covid Evaluation: Asymptomatic Screening Protocol (No Symptoms)  Diagnosis: Acute exacerbation of CHF (congestive heart failure) (HCC) [161096]  Admitting Physician: Therisa Doyne [[id number]]  Attending Physician: Therisa Doyne [[id number]]  PT Class (Do Not Modify): Observation [104]  PT Acc Code (Do Not Modify): Observation [10022]        Obs    Level of care    tele  For   24H        Precautions:  NONE   No active isolations  PPE: Used by the provider:   P100  eye Goggles,  Gloves  Lyberti Thrush 03/27/2019, 1:20 AM    [facility]     after 2 AM please page floor coverage PA If 7AM-7PM, please contact the day team taking care of the patient using [email or web address]

## 2019-03-26 NOTE — Telephone Encounter (Signed)
Does she make brisk urine with the 120 mg lasix? If it makes her urinate, we can try BID dosing, but if she is that short of breath and weight up that high, she may require IV lasix. If she does not have significant urine output with the lasix I would recommend she come to ER to be evaluated and possibly admitted given that we cannot get her into the office in time for an urgent visit today (I am DOD). Thanks!

## 2019-03-26 NOTE — Telephone Encounter (Signed)
Returned call to pt she states that she is not urinating anymore than normal and stats that she is also constipated a for a few days now. She is still verbally SOB but no worse that last call but still on he home O2. Notified she needs to go to ER for IV lasix for diuresis since there are no appts to be evaluated here in the office. Daughter Rosezena Sensor understanding and will take  Pt to the ER for evaluation.

## 2019-03-26 NOTE — Telephone Encounter (Signed)
Returned call to pt she states that she has gained 20# in 2 days her weight today is 333# and 2 days ago 318#. She is verbally SOB and she is wearing her O2 today because she is so SOB.she is taking all her medications as ordered with her lasix 120mg  BID as ordered. She took first dose this morning and is scheduled to take her 2nd dose at [facility] to increase lasix? Please advise

## 2019-03-26 NOTE — ED Provider Notes (Signed)
[facility] EMERGENCY DEPARTMENT Provider Note   CSN: [id number] Arrival date & time: 03/26/19  1635    History   Chief Complaint Chief Complaint  Patient presents with  . Shortness of Breath    HPI Felicia Rivas is a 63 y.o. female with past medical history of CAD, CHF, diabetes, hypertension, hyperlipidemia presents to emergency department today with chief complaint of weight gain.  Patient states in the last 4 days she has gained 25 pounds.  She reports associated shortness of breath at rest and with exertion.  She usually wears nasal cannula O2 at night however has had to wear it throughout the day the last 4 days.  Patient takes Lasix and has not missed any doses.  She admits to associated bilateral lower extremity edema, stating her legs look bigger than they usually do.  She denies any fever, chills, chest pain, cough, palpitations, sick contacts.  Chart review shows patient called her cardiologist today worsening dyspnea.  She was advised to go to ED for IV Lasix for diuresis because there were no available appointments in office.   She is s/p CABG 08/2011, echo from 10/2014 with normal LV function and grade 2 diastolic dysfunction.  Nuclear study 08/2018 showed ejection fraction 47%. Patient's cardiologist is Dr. Jens Som.  Past Medical History:  Diagnosis Date  . Asthma   . Asthma   . CAD (coronary artery disease)    a. NSTEMI 12/12 -> s/p CABGx3.   . Carotid artery disease (HCC)    a. Carotid US (08/2013): R 60-79%; L 40-59%; f/u 6 mos.  . Carotid stenosis    a. Carotid US (08/2013):  R 60-79%; L 40-59%; f/u 6 mos  . Chronic diastolic CHF (congestive heart failure) (HCC)    a. Echo 08/2011: mod LVH, nl EF. b. Echo 01/2013: EF 55-60%, mod-sev LVH, mod-sev LA dilitation  . Chronic diastolic heart failure (HCC)    a. Echo 08/2011: mod LVH, nl EF. b. Echo 01/2013: EF 55-60%, mod-sev LVH, mod-sev LA dilitation.  . Complication of anesthesia    '' Myself & my  family wake up during surgery "  . Complication of anesthesia   . Coronary artery disease    a. NSTEMI 12/12 -> s/p CABGx3.  [name] Cough   . Cough    a. 2014: Suspect a combination of CHF, GERD and ACE inhibitor-induced cough.  . Diabetes mellitus   . Diabetes mellitus (HCC)   . History of chicken pox   . HLD (hyperlipidemia)   . HTN (hypertension)   . Hyperlipidemia   . Hypertension   . Morbid obesity (HCC)   . Myocardial infarction (HCC)   . PONV (postoperative nausea and vomiting)   . S/P CABG (coronary artery bypass graft)    a. 2012 - Dr. Laneta Simmers;  LIMA-LAD, SVG-OM 2, SVG-PDA.  . S/P CABG x 3 09/07/11   Dr. Laneta Simmers;   LIMA-LAD, SVG-OM 2, SVG-PDA.  . Sleep disturbance    a. Had sleep study several years ago, wasn't told she has OSA but told she needs O2 at night.  . Sleep disturbance    Had sleep study several years ago, wasn't told she has OSA but told she needs O2 at night  . Stroke (HCC)   . Stroke [facility] 2006   denies residual  . TIA (transient ischemic attack)     Patient Active Problem List   Diagnosis Date Noted  . Pressure ulcer of right heel, unspecified stage 12/15/2018  . Acute renal  injury ([place]) 12/14/2018  . Perianal abscess 12/14/2018  . Abnormal EKG 07/17/2018  . Metatarsalgia of both feet 07/16/2018  . Other acquired hammer toe 07/16/2018  . Diabetic peripheral neuropathy ([place]) 07/04/2018  . Diabetic polyneuropathy associated with type 2 diabetes mellitus ([place]) 04/27/2018  . Psychophysiological insomnia 03/19/2018  . 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 Degeneration of lumbar intervertebral disc 02/08/2018  . Weakness of left leg 01/19/2018  . Vitamin D deficiency 09/22/2016  . Past history of myocardial infarction 04/18/2016  . Diabetic retinopathy associated with type 2 diabetes mellitus ([place]) 11/19/2015  . Long-term insulin use ([place]) 11/19/2015  . Acute on chronic diastolic congestive  heart failure ([place]) 11/14/2014  . ACE-inhibitor cough 11/10/2014  . History of asthma 11/10/2014  . Carotid artery disease ([place]) 11/10/2014  . Cough 11/10/2014  . LVH (left ventricular hypertrophy) 11/10/2014  . Shortness of breath 11/10/2014  . Hypotension 11/10/2014  . Renal insufficiency 11/10/2014  . Lightheadedness 11/10/2014  . Bronchitis, acute 11/10/2014  . Carotid stenosis   . Pain in joint, ankle and foot 04/05/2013  . Mild intermittent asthma without complication 33/82/5053  . Preventative health care 11/26/2011  . Hypertension 11/25/2011  . Incisional infection 11/03/2011  . Retinopathy 10/18/2011  . Hx of CABG 2012   . Chronic diastolic heart failure ([place])   . HLD (hyperlipidemia)   . Morbid obesity ([place]) 09/01/2011  . Asthma 08/31/2011  . DM2 (diabetes mellitus, type 2) ([place]) 08/31/2011    Past Surgical History:  Procedure Laterality Date  . CAROTID ANGIOGRAPHY N/A 03/17/2017   Procedure: Carotid Angiography;  Surgeon: Elam Dutch, MD;  Location: [place] [facility];  Service: Cardiovascular;  Laterality: N/A;  . CAROTID PTA/STENT INTERVENTION Right 03/29/2017   Procedure: Carotid PTA/Stent Intervention;  Surgeon: Elam Dutch, MD;  Location: [place] [facility];  Service: Cardiovascular;  Laterality: Right;  . CHOLECYSTECTOMY    . CHOLECYSTECTOMY  1990's  . CORONARY ARTERY BYPASS GRAFT    . CORONARY ARTERY BYPASS GRAFT  09/07/2011   CABG X3; Procedure: CORONARY ARTERY BYPASS GRAFTING (CABG);  Surgeon: Gaye Pollack, MD;  Location: [place];  Service: Open Heart Surgery;  Laterality: N/A;  . LEFT HEART CATHETERIZATION WITH CORONARY ANGIOGRAM N/A 09/02/2011   Procedure: LEFT HEART CATHETERIZATION WITH CORONARY ANGIOGRAM;  Surgeon: Hillary Bow, MD;  Location: [facility] CATH LAB;  Service: Cardiovascular;  Laterality: N/A;  . TUBAL LIGATION  1981     OB History    Gravida  0   Para  0   Term  0   Preterm  0   AB  0   Living        SAB  0   TAB   0   Ectopic  0   Multiple      Live Births               Home Medications    Prior to Admission medications   Medication Sig Start Date End Date Taking? Authorizing Provider  albuterol (PROVENTIL HFA;VENTOLIN HFA) 108 (90 BASE) MCG/ACT inhaler Inhale 2 puffs into the lungs every 6 (six) hours as needed for wheezing or shortness of breath. 11/11/14   Charlie Pitter, PA-C  aspirin EC 81 MG tablet Take 1 tablet (81 mg total) by mouth daily. 07/17/18   Erlene Quan, PA-C  atorvastatin (LIPITOR) 80 MG tablet Take 1 tablet (80 mg total) by mouth daily. 07/17/18  Kilroy, Luke K, PA-C  budesonide-formoterol (SYMBICORT) 160-4.5 MCG/ACT inhaler Inhale 2 puffs into the lungs every 12 (twelve) hours. 10/22/18   [provider]  clopidogrel (PLAVIX) 75 MG tablet Take 1 tablet (75 mg total) by mouth daily. 02/01/19   Sherren KernsFields, Charles E, MD  collagenase (SANTYL) ointment Apply topically daily. 12/15/18   Lanelle BalHarbrecht, Lawrence, MD  doxycycline (VIBRA-TABS) 100 MG tablet Take 1 tablet (100 mg total) by mouth 2 (two) times daily. Patient not taking: Reported on 01/18/2019 12/15/18   Lanelle BalHarbrecht, Lawrence, MD  furosemide (LASIX) 40 MG tablet Take 3 tablets (120 mg total) by mouth 2 (two) times daily. 02/04/19   Lewayne Buntingrenshaw, Brian S, MD  gabapentin (NEURONTIN) 300 MG capsule Take 600 mg by mouth 3 (three) times daily.     [provider]  glucose blood (FREESTYLE LITE) test strip Check bg 3 times/day 11/19/15   [provider]  glucose blood (PRECISION QID TEST) test strip Check bg 3 times/day 11/19/15   [provider]  glucose blood test strip Check bg 3 times/day 11/19/15   [provider]  insulin regular human CONCENTRATED (HUMULIN R) 500 UNIT/ML injection Inject 12-20 Units into the skin See admin instructions. Inject 20 units in the morning (8AM) and 12 units at night (10PM) with additional as needed. *Max daily dosage 65units/day 02/20/17   [provider]   Liraglutide 18 MG/3ML SOPN Inject 1.8 mg into the skin daily.    [provider]  metoprolol succinate (TOPROL-XL) 25 MG 24 hr tablet Take 1 tablet (25 mg total) by mouth daily. 07/17/18   Lewayne Buntingrenshaw, Brian S, MD  mupirocin cream (BACTROBAN) 2 % Apply 1 application topically 3 (three) times daily. Wound care 12/05/18 12/05/19  [provider]  OXYGEN Inhale 2 L/day into the lungs.    [provider]  potassium chloride SA (K-DUR,KLOR-CON) 20 MEQ tablet Take 2 tablets (40 mEq total) by mouth daily. 09/07/18   Lewayne Buntingrenshaw, Brian S, MD  Vitamin D, Ergocalciferol, (DRISDOL) 1.25 MG (50000 UT) CAPS capsule Take 50,000 Units by mouth every Sunday. 11/09/18   [provider]    Family History Family History  Problem Relation Age of Onset  . Cancer Mother   . Heart failure Mother        Also has hx MVP s/p MVR  . Diabetes Mother   . Coronary artery disease Father        Died of MI at age 64  . Heart disease Other        Parent    Social History Social History   Tobacco Use  . Smoking status: Former Smoker    Packs/day: 0.50    Years: 25.00    Pack years: 12.50    Types: Cigarettes    Quit date: 11/11/2011    Years since quitting: 7.3  . Smokeless tobacco: Former Engineer, waterUser  Substance Use Topics  . Alcohol use: No  . Drug use: No     Allergies   Lisinopril, Other, and Tramadol   Review of Systems Review of Systems  Constitutional: Negative for chills and fever.  HENT: Negative for congestion, ear discharge, ear pain, sinus pressure, sinus pain and sore throat.   Eyes: Negative for pain and redness.  Respiratory: Positive for shortness of breath. Negative for cough.   Cardiovascular: Positive for leg swelling. Negative for chest pain.  Gastrointestinal: Positive for abdominal distention. Negative for abdominal pain, constipation, diarrhea, nausea and vomiting.  Genitourinary: Negative for dysuria and hematuria.  Musculoskeletal: Negative for back pain  and neck pain.  Skin: Negative for wound.  Neurological: Negative for weakness, numbness and headaches.     Physical Exam Updated Vital Signs BP 139/70   Pulse 63   Temp 97.8 F (36.6 C) (Oral)   Resp 15   Ht 5\' 6"  (1.676 m)   Wt (!) 151.5 kg   SpO2 99%   BMI 53.91 kg/m   Physical Exam Vitals signs and nursing note reviewed.  Constitutional:      General: She is not in acute distress.    Appearance: She is well-developed. She is not ill-appearing or toxic-appearing.  HENT:     Head: Normocephalic and atraumatic.     Right Ear: Tympanic membrane and external ear normal.     Left Ear: Tympanic membrane and external ear normal.     Nose: Nose normal.     Mouth/Throat:     Mouth: Mucous membranes are moist.     Pharynx: Oropharynx is clear.  Eyes:     General: No scleral icterus.       Right eye: No discharge.        Left eye: No discharge.     Extraocular Movements: Extraocular movements intact.     Conjunctiva/sclera: Conjunctivae normal.     Pupils: Pupils are equal, round, and reactive to light.  Neck:     Musculoskeletal: Normal range of motion.     Vascular: No JVD.  Cardiovascular:     Rate and Rhythm: Normal rate and regular rhythm.     Pulses: Normal pulses.          Radial pulses are 2+ on the right side and 2+ on the left side.     Heart sounds: Normal heart sounds.  Pulmonary:     Effort: Pulmonary effort is normal.     Comments: Sounds diminished throughout.  Rales noted in posterior lower fields.  SPO2 is 97% on 2L [place]. Abdominal:     General: There is no distension.     Comments: Abdomen is distended, soft. Non-tender in all quadrants. No rigidity, no guarding. No peritoneal signs.  Musculoskeletal: Normal range of motion.     Right lower leg: 1+ Pitting Edema present.     Left lower leg: 1+ Pitting Edema present.  Skin:    General: Skin is warm and dry.     Capillary Refill: Capillary refill takes less than 2 seconds.  Neurological:     Mental  Status: She is oriented to person, place, and time.     GCS: GCS eye subscore is 4. GCS verbal subscore is 5. GCS motor subscore is 6.     Comments: Fluent speech, no facial droop.  Psychiatric:        Behavior: Behavior normal.      ED Treatments / Results  Labs (all labs ordered are listed, but only abnormal results are displayed) Labs Reviewed  BASIC METABOLIC PANEL - Abnormal; Notable for the following components:      Result Value   Glucose, Bld 48 (*)    BUN 37 (*)    Creatinine, Ser 1.40 (*)    GFR calc non Af Amer 40 (*)    GFR calc Af Amer 46 (*)    All other components within normal limits  CBC - Abnormal; Notable for the following components:   Hemoglobin 11.7 (*)    All other components within normal limits  CBG MONITORING, ED - Abnormal; Notable for the following components:  Glucose-Capillary 111 (*)    All other components within normal limits  SARS CORONAVIRUS 2 (HOSPITAL ORDER, PERFORMED IN [place] [facility])  BRAIN NATRIURETIC PEPTIDE  TROPONIN I (HIGH SENSITIVITY)  TROPONIN I (HIGH SENSITIVITY)  D-DIMER, QUANTITATIVE (NOT AT [facility])    EKG EKG Interpretation  Date/Time:  Tuesday March 26 2019 16:44:12 EDT Ventricular Rate:  70 PR Interval:  180 QRS Duration: 158 QT Interval:  458 QTC Calculation: 494 R Axis:   -74 Text Interpretation:  Normal sinus rhythm Left axis deviation Left bundle branch block Abnormal ECG lbbb is new Confirmed by Benjiman CorePickering, Nathan [phone]([id number]) on 03/26/2019 4:50:43 PM   Radiology Dg Chest 2 View  Result Date: 03/26/2019 CLINICAL DATA:  Shortness of breath EXAM: CHEST - 2 VIEW COMPARISON:  11/11/2014 FINDINGS: Shallow lung inflation with mild cardiomegaly. Remote median sternotomy. No focal airspace consolidation or pulmonary edema. No pleural effusion or pneumothorax. IMPRESSION: No active cardiopulmonary disease. Electronically Signed   By: Deatra RobinsonKevin  Herman M.D.   On: 03/26/2019 17:22    Procedures Procedures (including  critical care time)  Medications Ordered in ED Medications  sodium chloride flush (NS) 0.9 % injection 3 mL (3 mLs Intravenous Not Given 03/26/19 2047)  furosemide (LASIX) injection 60 mg (60 mg Intravenous Given 03/26/19 2055)  gabapentin (NEURONTIN) capsule 300 mg (300 mg Oral Given 03/26/19 2103)  dextrose 50 % solution 50 mL (50 mLs Intravenous Given 03/26/19 2052)     Initial Impression / Assessment and Plan / ED Course  I have reviewed the triage vital signs and the nursing notes.  Pertinent labs & imaging results that were available during my care of the patient were reviewed by me and considered in my medical decision making (see chart for details).   63 year old female presents with shortness of breath. She is afebrile, hypoxic on room air 92% on arrival.  SPO2 improved with 2 L nasal cannula.  She is non toxic appearing, in no acute distress.  On exam rales appreciated posterior lower fields.  Bilateral lower extremity edema 1+ pitting, plan for IV diuresis Abdomen is soft and distended.  Work-up significant for EKG showing new left bundle branch block.  I looked at EKG with attending Dr. Donnald GarrePfeiffer who agrees this is changed from prior.  CBC unremarkable.  BMP shows dated BUN/creatinine at 37/1.40 and hypoglycemia of 48.  Will give 1 amp of D50 and reassess.  BNP is not suggestive of signs of acute heart failure exacerbation, within normal range 85.4.  Chest x-ray reviewed by me shows shallow lung inflation with mild cardiomegaly, no infiltrate or edema.  Findings and plan of care discussed with supervising physician Dr. Donnald GarrePfeiffer. Plan to admit for IV diuresis and new LBBB. Spoke with Dr. Adela Glimpseoutova with hospitalist service who agrees to assume care of patient and bring into the hospital for further evaluation and management.      This note was prepared using Dragon voice recognition software and may include unintentional dictation errors due to the inherent limitations of voice recognition  software.    Final Clinical Impressions(s) / ED Diagnoses   Final diagnoses:  Acute on chronic diastolic heart failure (HCC)  Left bundle branch block    ED Discharge Orders    None       Sherene Sireslbrizze, Gracelynn Bircher E, PA-C 03/27/19 [id number]    Arby BarrettePfeiffer, Marcy, MD 03/31/19 [phone]

## 2019-03-27 ENCOUNTER — Observation Stay (HOSPITAL_BASED_OUTPATIENT_CLINIC_OR_DEPARTMENT_OTHER): Payer: BC Managed Care – PPO

## 2019-03-27 ENCOUNTER — Other Ambulatory Visit: Payer: Self-pay

## 2019-03-27 ENCOUNTER — Ambulatory Visit: Payer: BC Managed Care – PPO | Admitting: Family

## 2019-03-27 DIAGNOSIS — D649 Anemia, unspecified: Secondary | ICD-10-CM | POA: Diagnosis present

## 2019-03-27 DIAGNOSIS — R609 Edema, unspecified: Secondary | ICD-10-CM | POA: Diagnosis not present

## 2019-03-27 DIAGNOSIS — E876 Hypokalemia: Secondary | ICD-10-CM | POA: Diagnosis present

## 2019-03-27 DIAGNOSIS — N289 Disorder of kidney and ureter, unspecified: Secondary | ICD-10-CM | POA: Diagnosis not present

## 2019-03-27 DIAGNOSIS — L03115 Cellulitis of right lower limb: Secondary | ICD-10-CM | POA: Diagnosis not present

## 2019-03-27 DIAGNOSIS — L03116 Cellulitis of left lower limb: Secondary | ICD-10-CM | POA: Diagnosis not present

## 2019-03-27 DIAGNOSIS — I509 Heart failure, unspecified: Secondary | ICD-10-CM

## 2019-03-27 DIAGNOSIS — E871 Hypo-osmolality and hyponatremia: Secondary | ICD-10-CM | POA: Diagnosis not present

## 2019-03-27 DIAGNOSIS — Z6841 Body Mass Index (BMI) 40.0 and over, adult: Secondary | ICD-10-CM | POA: Diagnosis not present

## 2019-03-27 DIAGNOSIS — I2510 Atherosclerotic heart disease of native coronary artery without angina pectoris: Secondary | ICD-10-CM | POA: Diagnosis not present

## 2019-03-27 DIAGNOSIS — E861 Hypovolemia: Secondary | ICD-10-CM | POA: Diagnosis present

## 2019-03-27 DIAGNOSIS — E785 Hyperlipidemia, unspecified: Secondary | ICD-10-CM | POA: Diagnosis not present

## 2019-03-27 DIAGNOSIS — I10 Essential (primary) hypertension: Secondary | ICD-10-CM

## 2019-03-27 DIAGNOSIS — K5900 Constipation, unspecified: Secondary | ICD-10-CM | POA: Diagnosis present

## 2019-03-27 DIAGNOSIS — I872 Venous insufficiency (chronic) (peripheral): Secondary | ICD-10-CM | POA: Diagnosis present

## 2019-03-27 DIAGNOSIS — E1165 Type 2 diabetes mellitus with hyperglycemia: Secondary | ICD-10-CM | POA: Diagnosis present

## 2019-03-27 DIAGNOSIS — E11649 Type 2 diabetes mellitus with hypoglycemia without coma: Secondary | ICD-10-CM | POA: Diagnosis present

## 2019-03-27 DIAGNOSIS — I130 Hypertensive heart and chronic kidney disease with heart failure and stage 1 through stage 4 chronic kidney disease, or unspecified chronic kidney disease: Secondary | ICD-10-CM | POA: Diagnosis not present

## 2019-03-27 DIAGNOSIS — I5033 Acute on chronic diastolic (congestive) heart failure: Secondary | ICD-10-CM

## 2019-03-27 DIAGNOSIS — E1122 Type 2 diabetes mellitus with diabetic chronic kidney disease: Secondary | ICD-10-CM | POA: Diagnosis not present

## 2019-03-27 DIAGNOSIS — N183 Chronic kidney disease, stage 3 (moderate): Secondary | ICD-10-CM | POA: Diagnosis present

## 2019-03-27 DIAGNOSIS — Z20828 Contact with and (suspected) exposure to other viral communicable diseases: Secondary | ICD-10-CM | POA: Diagnosis not present

## 2019-03-27 DIAGNOSIS — N179 Acute kidney failure, unspecified: Secondary | ICD-10-CM | POA: Diagnosis not present

## 2019-03-27 DIAGNOSIS — L97519 Non-pressure chronic ulcer of other part of right foot with unspecified severity: Secondary | ICD-10-CM | POA: Diagnosis not present

## 2019-03-27 DIAGNOSIS — J9621 Acute and chronic respiratory failure with hypoxia: Secondary | ICD-10-CM | POA: Diagnosis not present

## 2019-03-27 DIAGNOSIS — L97919 Non-pressure chronic ulcer of unspecified part of right lower leg with unspecified severity: Secondary | ICD-10-CM | POA: Diagnosis not present

## 2019-03-27 DIAGNOSIS — I472 Ventricular tachycardia: Secondary | ICD-10-CM | POA: Diagnosis not present

## 2019-03-27 DIAGNOSIS — Y838 Other surgical procedures as the cause of abnormal reaction of the patient, or of later complication, without mention of misadventure at the time of the procedure: Secondary | ICD-10-CM | POA: Diagnosis present

## 2019-03-27 DIAGNOSIS — I878 Other specified disorders of veins: Secondary | ICD-10-CM | POA: Diagnosis present

## 2019-03-27 DIAGNOSIS — J9620 Acute and chronic respiratory failure, unspecified whether with hypoxia or hypercapnia: Secondary | ICD-10-CM | POA: Diagnosis present

## 2019-03-27 DIAGNOSIS — I252 Old myocardial infarction: Secondary | ICD-10-CM | POA: Diagnosis not present

## 2019-03-27 DIAGNOSIS — I447 Left bundle-branch block, unspecified: Secondary | ICD-10-CM | POA: Diagnosis not present

## 2019-03-27 LAB — CBC
HCT: 35.6 % — ABNORMAL LOW (ref 36.0–46.0)
Hemoglobin: 11.2 g/dL — ABNORMAL LOW (ref 12.0–15.0)
MCH: 29.4 pg (ref 26.0–34.0)
MCHC: 31.5 g/dL (ref 30.0–36.0)
MCV: 93.4 fL (ref 80.0–100.0)
Platelets: 240 10*3/uL (ref 150–400)
RBC: 3.81 MIL/uL — ABNORMAL LOW (ref 3.87–5.11)
RDW: 14.4 % (ref 11.5–15.5)
WBC: 10.4 10*3/uL (ref 4.0–10.5)
nRBC: 0 % (ref 0.0–0.2)

## 2019-03-27 LAB — COMPREHENSIVE METABOLIC PANEL
ALT: 19 U/L (ref 0–44)
AST: 23 U/L (ref 15–41)
Albumin: 3.7 g/dL (ref 3.5–5.0)
Alkaline Phosphatase: 120 U/L (ref 38–126)
Anion gap: 12 (ref 5–15)
BUN: 36 mg/dL — ABNORMAL HIGH (ref 8–23)
CO2: 28 mmol/L (ref 22–32)
Calcium: 9.2 mg/dL (ref 8.9–10.3)
Chloride: 100 mmol/L (ref 98–111)
Creatinine, Ser: 1.5 mg/dL — ABNORMAL HIGH (ref 0.44–1.00)
GFR calc Af Amer: 42 mL/min — ABNORMAL LOW (ref >60)
GFR calc non Af Amer: 36 mL/min — ABNORMAL LOW (ref >60)
Glucose, Bld: 194 mg/dL — ABNORMAL HIGH (ref 70–99)
Potassium: 4.5 mmol/L (ref 3.5–5.1)
Sodium: 140 mmol/L (ref 135–145)
Total Bilirubin: 1.1 mg/dL (ref 0.3–1.2)
Total Protein: 7 g/dL (ref 6.5–8.1)

## 2019-03-27 LAB — TSH: TSH: 2.747 u[IU]/mL (ref 0.350–4.500)

## 2019-03-27 LAB — MAGNESIUM: Magnesium: 2.5 mg/dL — ABNORMAL HIGH (ref 1.7–2.4)

## 2019-03-27 LAB — D-DIMER, QUANTITATIVE: D-Dimer, Quant: 1.12 ug/mL-FEU — ABNORMAL HIGH (ref 0.00–0.50)

## 2019-03-27 LAB — GLUCOSE, CAPILLARY
Glucose-Capillary: 160 mg/dL — ABNORMAL HIGH (ref 70–99)
Glucose-Capillary: 175 mg/dL — ABNORMAL HIGH (ref 70–99)
Glucose-Capillary: 254 mg/dL — ABNORMAL HIGH (ref 70–99)
Glucose-Capillary: 257 mg/dL — ABNORMAL HIGH (ref 70–99)
Glucose-Capillary: 263 mg/dL — ABNORMAL HIGH (ref 70–99)
Glucose-Capillary: 94 mg/dL (ref 70–99)

## 2019-03-27 LAB — HEMOGLOBIN A1C
Hgb A1c MFr Bld: 5.8 % — ABNORMAL HIGH (ref 4.8–5.6)
Mean Plasma Glucose: 119.76 mg/dL

## 2019-03-27 LAB — ECHOCARDIOGRAM COMPLETE
Height: 66 in
Weight: 5296 oz

## 2019-03-27 LAB — PHOSPHORUS: Phosphorus: 4 mg/dL (ref 2.5–4.6)

## 2019-03-27 LAB — TROPONIN I (HIGH SENSITIVITY): Troponin I (High Sensitivity): 12 ng/L (ref <18)

## 2019-03-27 MED ORDER — ATORVASTATIN CALCIUM 80 MG PO TABS
80.0000 mg | ORAL_TABLET | Freq: Every day | ORAL | Status: DC
  Administered 2019-03-27 – 2019-04-05 (×10): 80 mg via ORAL
  Filled 2019-03-27 (×10): qty 1

## 2019-03-27 MED ORDER — GABAPENTIN 300 MG PO CAPS
600.0000 mg | ORAL_CAPSULE | Freq: Three times a day (TID) | ORAL | Status: DC
  Administered 2019-03-27 – 2019-03-30 (×12): 600 mg via ORAL
  Filled 2019-03-27 (×11): qty 2

## 2019-03-27 MED ORDER — POTASSIUM CHLORIDE CRYS ER 20 MEQ PO TBCR
40.0000 meq | EXTENDED_RELEASE_TABLET | Freq: Every day | ORAL | Status: DC
  Administered 2019-03-27 – 2019-03-29 (×3): 40 meq via ORAL
  Filled 2019-03-27 (×4): qty 2

## 2019-03-27 MED ORDER — SODIUM CHLORIDE 0.9% FLUSH: 3.0000 mL | INTRAVENOUS | Status: DC | PRN

## 2019-03-27 MED ORDER — INSULIN REGULAR HUMAN (CONC) 500 UNIT/ML ~~LOC~~ SOPN
15.0000 [IU] | PEN_INJECTOR | Freq: Two times a day (BID) | SUBCUTANEOUS | Status: DC
  Administered 2019-03-27 (×2): 15 [IU] via SUBCUTANEOUS
  Filled 2019-03-27: qty 3

## 2019-03-27 MED ORDER — POLYETHYLENE GLYCOL 3350 17 G PO PACK
17.0000 g | PACK | Freq: Two times a day (BID) | ORAL | Status: DC
  Administered 2019-03-27 – 2019-03-29 (×5): 17 g via ORAL
  Filled 2019-03-27 (×4): qty 1

## 2019-03-27 MED ORDER — POLYETHYLENE GLYCOL 3350 17 G PO PACK: 17.0000 g | PACK | Freq: Every day | ORAL | Status: DC | PRN

## 2019-03-27 MED ORDER — CLOPIDOGREL BISULFATE 75 MG PO TABS
75.0000 mg | ORAL_TABLET | Freq: Every day | ORAL | Status: DC
  Administered 2019-03-27 – 2019-04-05 (×10): 75 mg via ORAL
  Filled 2019-03-27 (×10): qty 1

## 2019-03-27 MED ORDER — HYDROCODONE-ACETAMINOPHEN 5-325 MG PO TABS
1.0000 | ORAL_TABLET | ORAL | Status: DC | PRN
  Administered 2019-03-27: 2 via ORAL
  Filled 2019-03-27: qty 2

## 2019-03-27 MED ORDER — OXYCODONE HCL 5 MG PO TABS
5.0000 mg | ORAL_TABLET | ORAL | Status: DC | PRN
  Administered 2019-03-27: 01:00:00 5 mg via ORAL
  Filled 2019-03-27: qty 1

## 2019-03-27 MED ORDER — FUROSEMIDE 10 MG/ML IJ SOLN
40.0000 mg | Freq: Two times a day (BID) | INTRAMUSCULAR | Status: DC
  Administered 2019-03-27: 40 mg via INTRAVENOUS
  Filled 2019-03-27: qty 4

## 2019-03-27 MED ORDER — METOPROLOL SUCCINATE ER 25 MG PO TB24
25.0000 mg | ORAL_TABLET | Freq: Every day | ORAL | Status: DC
  Administered 2019-03-27 – 2019-03-30 (×4): 25 mg via ORAL
  Filled 2019-03-27 (×4): qty 1

## 2019-03-27 MED ORDER — ACETAMINOPHEN 650 MG RE SUPP: 650.0000 mg | Freq: Four times a day (QID) | RECTAL | Status: DC | PRN

## 2019-03-27 MED ORDER — FUROSEMIDE 10 MG/ML IJ SOLN
40.0000 mg | Freq: Once | INTRAMUSCULAR | Status: AC
  Administered 2019-03-27: 40 mg via INTRAVENOUS
  Filled 2019-03-27: qty 4

## 2019-03-27 MED ORDER — PERFLUTREN LIPID MICROSPHERE
1.0000 mL | INTRAVENOUS | Status: AC | PRN
  Administered 2019-03-27: 2 mL via INTRAVENOUS
  Filled 2019-03-27: qty 10

## 2019-03-27 MED ORDER — ENOXAPARIN SODIUM 40 MG/0.4ML ~~LOC~~ SOLN
40.0000 mg | Freq: Every day | SUBCUTANEOUS | Status: DC
  Administered 2019-03-27 – 2019-03-28 (×2): 40 mg via SUBCUTANEOUS
  Filled 2019-03-27 (×2): qty 0.4

## 2019-03-27 MED ORDER — ACETAMINOPHEN 325 MG PO TABS
650.0000 mg | ORAL_TABLET | Freq: Four times a day (QID) | ORAL | Status: DC | PRN
  Filled 2019-03-27: qty 2

## 2019-03-27 MED ORDER — MILK AND MOLASSES ENEMA: 1.0000 | RECTAL | Status: DC | PRN

## 2019-03-27 MED ORDER — SODIUM CHLORIDE 0.9 % IV SOLN
250.0000 mL | INTRAVENOUS | Status: DC | PRN
  Administered 2019-03-29: 250 mL via INTRAVENOUS

## 2019-03-27 MED ORDER — MAGNESIUM HYDROXIDE 400 MG/5ML PO SUSP
30.0000 mL | Freq: Every day | ORAL | Status: AC
  Administered 2019-03-27 – 2019-03-28 (×2): 30 mL via ORAL
  Filled 2019-03-27 (×2): qty 30

## 2019-03-27 MED ORDER — ASPIRIN EC 325 MG PO TBEC
325.0000 mg | DELAYED_RELEASE_TABLET | Freq: Every day | ORAL | Status: DC
  Administered 2019-03-27: 325 mg via ORAL
  Filled 2019-03-27: qty 1

## 2019-03-27 MED ORDER — SODIUM CHLORIDE 0.9% FLUSH
3.0000 mL | Freq: Two times a day (BID) | INTRAVENOUS | Status: DC
  Administered 2019-03-27 – 2019-04-05 (×17): 3 mL via INTRAVENOUS

## 2019-03-27 MED ORDER — ONDANSETRON HCL 4 MG PO TABS: 4.0000 mg | ORAL_TABLET | Freq: Four times a day (QID) | ORAL | Status: DC | PRN

## 2019-03-27 MED ORDER — FUROSEMIDE 10 MG/ML IJ SOLN
120.0000 mg | Freq: Two times a day (BID) | INTRAVENOUS | Status: DC
  Administered 2019-03-27 – 2019-03-30 (×7): 120 mg via INTRAVENOUS
  Filled 2019-03-27 (×2): qty 12
  Filled 2019-03-27 (×5): qty 10
  Filled 2019-03-27 (×2): qty 12

## 2019-03-27 MED ORDER — INSULIN REGULAR HUMAN (CONC) 500 UNIT/ML ~~LOC~~ SOPN
50.0000 [IU] | PEN_INJECTOR | Freq: Two times a day (BID) | SUBCUTANEOUS | Status: DC
  Filled 2019-03-27: qty 3

## 2019-03-27 MED ORDER — MOMETASONE FURO-FORMOTEROL FUM 200-5 MCG/ACT IN AERO
2.0000 | INHALATION_SPRAY | Freq: Two times a day (BID) | RESPIRATORY_TRACT | Status: DC
  Administered 2019-03-27 – 2019-04-05 (×20): 2 via RESPIRATORY_TRACT
  Filled 2019-03-27: qty 8.8

## 2019-03-27 MED ORDER — ONDANSETRON HCL 4 MG/2ML IJ SOLN: 4.0000 mg | Freq: Four times a day (QID) | INTRAMUSCULAR | Status: DC | PRN

## 2019-03-27 MED ORDER — METOLAZONE 2.5 MG PO TABS
2.5000 mg | ORAL_TABLET | Freq: Once | ORAL | Status: AC
  Administered 2019-03-28: 2.5 mg via ORAL
  Filled 2019-03-27: qty 1

## 2019-03-27 MED ORDER — ALBUTEROL SULFATE (2.5 MG/3ML) 0.083% IN NEBU: 2.5000 mg | INHALATION_SOLUTION | Freq: Four times a day (QID) | RESPIRATORY_TRACT | Status: DC | PRN

## 2019-03-27 MED ORDER — OXYCODONE-ACETAMINOPHEN 5-325 MG PO TABS
2.0000 | ORAL_TABLET | Freq: Once | ORAL | Status: AC
  Administered 2019-03-27: 2 via ORAL
  Filled 2019-03-27: qty 2

## 2019-03-27 MED ORDER — FUROSEMIDE 10 MG/ML IJ SOLN: 80.0000 mg | Freq: Two times a day (BID) | INTRAMUSCULAR | Status: DC

## 2019-03-27 MED ORDER — MILK AND MOLASSES ENEMA
1.0000 | Freq: Once | RECTAL | Status: AC
  Administered 2019-03-27: 240 mL via RECTAL
  Filled 2019-03-27: qty 240

## 2019-03-27 MED ORDER — OXYCODONE-ACETAMINOPHEN 5-325 MG PO TABS
1.0000 | ORAL_TABLET | ORAL | Status: AC | PRN
  Administered 2019-03-27 (×3): 1 via ORAL
  Filled 2019-03-27 (×3): qty 1

## 2019-03-27 MED ORDER — BISACODYL 10 MG RE SUPP: 10.0000 mg | Freq: Every day | RECTAL | Status: DC | PRN

## 2019-03-27 MED ORDER — SENNA 8.6 MG PO TABS
1.0000 | ORAL_TABLET | Freq: Two times a day (BID) | ORAL | Status: DC
  Administered 2019-03-27 – 2019-04-05 (×19): 8.6 mg via ORAL
  Filled 2019-03-27 (×19): qty 1

## 2019-03-27 NOTE — Consult Note (Addendum)
Cardiology Consultation:   Patient ID: Nakyiah Kuck MRN: [id number]; DOB: 26-Dec-1955  Admit date: 03/26/2019 Date of Consult: 03/27/2019  Primary Care Provider: Marjory Sneddon, MD Primary Cardiologist: Kirk Ruths, MD  Patient Profile:   Doralene Glanz is a 63 y.o. female with a hx of CAD s/p CABG x 3, Chronic diastolic CHF, morbid obesity, DM, HTN, carotid artery disease s/p  right carotid stent July 2018 / PAD followed by vascular (Dr. Donzetta Matters) and  HLD who is being seen today for the evaluation of CHF at the request of Dr. Roel Cluck.   Carotid doppler 06/2018: Right Carotid: Non-hemodynamically significant plaque <50% noted in the CCA.                Patent right ICA stent with a EDV suggestive of 40-59% distal to                stent. Right ECA not visualized as noted above.  Left Carotid: Velocities in the left ICA are consistent with a 40-59% stenosis.               Non-hemodynamically significant plaque noted in the CCA. The ECA appears >50% stenosed.   ABIs 06/28/18: 0.54 on the right 0.83 on the left (stable)  Nuclear study December 2019 showed ejection fraction 47%, reversible medium size inferolateral defect suggestive of ischemia.  Treated medically.    History of Present Illness:   Ms. Freundlich dealing with increased weight, dyspnea and LE edema x 3 months. Last seen by Dr. Stanford Breed 01/18/2019 increase Lasix to 120mg  AM and 80mg  PM without significant improvement. Now taking 120mg  BID without relief. She was treated with abx of LLE cellulitis. She has R leg pain from him to knee. Seen by orthopedic>> says "on acute issue on xray". She continued to have LE edema " R > L" with drainage. Called our office and recommended to come to ER for IV lasix. Did not urinated will on high dose of lasix. She has chronic dyspnea due to morbid obesity. Uses 2L oxygen 24/7. No Chest pain, palpitation, dizziness or syncope. She has RLE since her fracture in feb/2020.   COVID negative. Troponin  negative. Scr 1.4>>1.5. BNP 85. CXR without acute cardiopulmonary disease. Diuresed 340 cc.  No DVT by vascular study but poor study due to body habituate. D-Dimer 1.12.  Echo today showed LVEF of 55-60%, moderately increased LV wall thickness. Elevated left ventricular end-diastolic pressure.   Heart Pathway Score:     Past Medical History:  Diagnosis Date   Asthma    Asthma    CAD (coronary artery disease)    a. NSTEMI 12/12 -> s/p CABGx3.    Carotid artery disease ([place])    a. Carotid US (08/2013): R 60-79%; L 40-59%; f/u 6 mos.   Carotid stenosis    a. Carotid US (08/2013):  R 60-79%; L 40-59%; f/u 6 mos   Chronic diastolic CHF (congestive heart failure) ([place])    a. Echo 08/2011: mod LVH, nl EF. b. Echo 01/2013: EF 55-60%, mod-sev LVH, mod-sev LA dilitation   Chronic diastolic heart failure ([place])    a. Echo 08/2011: mod LVH, nl EF. b. Echo 01/2013: EF 55-60%, mod-sev LVH, mod-sev LA dilitation.   Complication of anesthesia    '' Myself & my family wake up during surgery "   Complication of anesthesia    Coronary artery disease    a. NSTEMI 12/12 -> s/p CABGx3.   Cough    Cough    a. 2014:  Suspect a combination of CHF, GERD and ACE inhibitor-induced cough.   Diabetes mellitus    Diabetes mellitus (HCC)    History of chicken pox    HLD (hyperlipidemia)    HTN (hypertension)    Hyperlipidemia    Hypertension    Morbid obesity (HCC)    Myocardial infarction (HCC)    PONV (postoperative nausea and vomiting)    S/P CABG (coronary artery bypass graft)    a. 2012 - Dr. Laneta SimmersBartle;  LIMA-LAD, SVG-OM 2, SVG-PDA.   S/P CABG x 3 09/07/11   Dr. Laneta SimmersBartle;   LIMA-LAD, SVG-OM 2, SVG-PDA.   Sleep disturbance    a. Had sleep study several years ago, wasn't told she has OSA but told she needs O2 at night.   Sleep disturbance    Had sleep study several years ago, wasn't told she has OSA but told she needs O2 at night   Stroke [facility](HCC)    Stroke [facility](HCC) 2006   denies  residual   TIA (transient ischemic attack)     Past Surgical History:  Procedure Laterality Date   CAROTID ANGIOGRAPHY N/A 03/17/2017   Procedure: Carotid Angiography;  Surgeon: Sherren KernsFields, Charles E, MD;  [facility];  Service: Cardiovascular;  Laterality: N/A;   CAROTID PTA/STENT INTERVENTION Right 03/29/2017   Procedure: Carotid PTA/Stent Intervention;  Surgeon: Sherren KernsFields, Charles E, MD;  Location: [facility] INVASIVE CV LAB;  Service: Cardiovascular;  Laterality: Right;   CHOLECYSTECTOMY     CHOLECYSTECTOMY  1990's   CORONARY ARTERY BYPASS GRAFT     CORONARY ARTERY BYPASS GRAFT  09/07/2011   CABG X3; Procedure: CORONARY ARTERY BYPASS GRAFTING (CABG);  Surgeon: Alleen BorneBryan K Bartle, MD;  [facility];  Service: Open Heart Surgery;  Laterality: N/A;   LEFT HEART CATHETERIZATION WITH CORONARY ANGIOGRAM N/A 09/02/2011   Procedure: LEFT HEART CATHETERIZATION WITH CORONARY ANGIOGRAM;  Surgeon: Herby Abrahamhomas D Stuckey, MD;  [facility];  Service: Cardiovascular;  Laterality: N/A;   TUBAL LIGATION  1981    Inpatient Medications: Scheduled Meds:  aspirin EC  325 mg Oral Daily   atorvastatin  80 mg Oral Daily   clopidogrel  75 mg Oral Daily   enoxaparin (LOVENOX) injection  40 mg Subcutaneous Daily   furosemide  80 mg Intravenous BID   gabapentin  600 mg Oral TID   insulin aspart  0-9 Units Subcutaneous Q4H   insulin regular human CONCENTRATED  15 Units Subcutaneous BID WC   magnesium hydroxide  30 mL Oral Daily   metoprolol succinate  25 mg Oral Daily   milk and molasses  1 enema Rectal Once   mometasone-formoterol  2 puff Inhalation BID   polyethylene glycol  17 g Oral BID   potassium chloride SA  40 mEq Oral Daily   senna  1 tablet Oral BID   sodium chloride flush  3 mL Intravenous Once   sodium chloride flush  3 mL Intravenous Q12H   Continuous Infusions:  sodium chloride     PRN Meds: sodium chloride, acetaminophen **OR** acetaminophen, albuterol,  bisacodyl, ondansetron **OR** ondansetron (ZOFRAN) IV, oxyCODONE-acetaminophen, sodium chloride flush  Allergies:    Allergies  Allergen Reactions   Lisinopril Cough, Nausea And Vomiting and Other (See Comments)    ACE Inhibitor Cough ?Cough - changed to ARB 02/2013. ACE Inhibitor Cough ACE Inhibitor Cough ?Cough - changed to ARB 02/2013. ACE Inhibitor Cough ?Cough - changed to ARB 02/2013.   Other Hives    Unknown IV antibiotic   Tramadol  Causes respiratory distress    Social History:   Social History   Socioeconomic History   Marital status: Married    Spouse name: Not on file   Number of children: 3   Years of education: 16   Highest education level: Not on file  Occupational History   Occupation: Magazine features editor: [employer]  Social Needs   Financial resource strain: Not on file   Food insecurity    Worry: Not on file    Inability: Not on file   Transportation needs    Medical: Not on file    Non-medical: Not on file  Tobacco Use   Smoking status: Former Smoker    Packs/day: 0.50    Years: 25.00    Pack years: 12.50    Types: Cigarettes    Quit date: 11/11/2011    Years since quitting: 7.3   Smokeless tobacco: Former Engineer, water and Sexual Activity   Alcohol use: No   Drug use: No   Sexual activity: Never  Lifestyle   Physical activity    Days per week: Not on file    Minutes per session: Not on file   Stress: Not on file  Relationships   Social connections    Talks on phone: Not on file    Gets together: Not on file    Attends religious service: Not on file    Active member of club or organization: Not on file    Attends meetings of clubs or organizations: Not on file    Relationship status: Not on file   Intimate partner violence    Fear of current or ex partner: Not on file    Emotionally abused: Not on file    Physically abused: Not on file    Forced sexual activity: Not on file  Other Topics Concern     Not on file  Social History Narrative   ** Merged History Encounter **       Regular exercise-yes    Family History:   Family History  Problem Relation Age of Onset   Cancer Mother    Heart failure Mother        Also has hx MVP s/p MVR   Diabetes Mother    Coronary artery disease Father        Died of MI at age 69   Heart disease Other        Parent     ROS:  Please see the history of present illness.  All other ROS reviewed and negative.     Physical Exam/Data:   Vitals:   03/27/19 0638 03/27/19 0758 03/27/19 0909 03/27/19 1151  BP:  (!) 111/49  103/79  Pulse:  74 68 72  Resp:  Temp:  (!) 97.5 F (36.4 C)  97.8 F (36.6 C)  TempSrc:  Oral  Oral  SpO2:  100% 96% 98%  Weight: (!) 150.1 kg     Height:        Intake/Output Summary (Last 24 hours) at 03/27/2019 1342 Last data filed at 03/27/2019 1228 Gross per 24 hour  Intake 60 ml  Output 400 ml  Net -340 ml   Last 3 Weights 03/27/2019 03/26/2019 01/18/2019  Weight (lbs) 331 lb 334 lb 310 lb  Weight (kg) 150.141 kg 151.501 kg 140.615 kg     Body mass index is 53.42 kg/m.  General:  Obese female setting in chair nn no acute distress on 2L  oxygen  HEENT: normal Lymph: no adenopathy Neck: JVD difficult to assess due to girth  Endocrine:  No thryomegaly Cardiac:  normal S1, S2; RRR; no murmur  Lungs:  clear to auscultation bilaterally, no wheezing, rhonchi or rales  Abd: soft, nontender, no hepatomegaly  Ext: 3+ LLE edema with ACe warp/ 2+ edema on RLE Musculoskeletal:  No deformities, BUE and BLE strength normal and equal Skin: warm and dry  Neuro:  CNs 2-12 intact, no focal abnormalities noted Psych:  Normal affect   EKG:  The EKG was personally reviewed and demonstrates:  NSR at rate of 70 bpm, LBBB (present in EKG of 06/2018) Telemetry:  Telemetry was personally reviewed and demonstrates:  SR at rate of 70s, occasional PVCs  Relevant CV Studies:  As above  Echo 03/27/2019 IMPRESSIONS    1. The left ventricle has normal systolic function, with an ejection fraction of 55-60%. The cavity size was mildly dilated. There is moderately increased left ventricular wall thickness. Left ventricular diastolic Doppler parameters are consistent with  pseudonormalization. Elevated  2. The right ventricle has normal systolic function. The cavity was normal. There is no increase in right ventricular wall thickness.  3. The mitral valve is grossly normal. There is mild mitral annular calcification present. Trivial MR.  4. The aortic valve was not well visualized.  5. Left atrial size was moderately dilated.  Laboratory Data:  High Sensitivity Troponin:   Recent Labs  Lab 03/26/19 1953 03/27/19 0016  TROPONINIHS 13 12       Chemistry Recent Labs  Lab 03/26/19 1645 03/27/19 0348  NA 140 140  K 4.0 4.5  CL 103 100  CO2 26 28  GLUCOSE 48* 194*  BUN 37* 36*  CREATININE 1.40* 1.50*  CALCIUM 9.3 9.2  GFRNONAA 40* 36*  GFRAA 46* 42*  ANIONGAP 11 12    Recent Labs  Lab 03/27/19 0348  PROT 7.0  ALBUMIN 3.7  AST 23  ALT 19  ALKPHOS 120  BILITOT 1.1   Hematology Recent Labs  Lab 03/26/19 1645 03/27/19 0348  WBC 8.5 10.4  RBC 3.96 3.81*  HGB 11.7* 11.2*  HCT 37.2 35.6*  MCV 93.9 93.4  MCH 29.5 29.4  MCHC 31.5 31.5  RDW 14.6 14.4  PLT 240 240   BNP Recent Labs  Lab 03/26/19 1826  BNP 85.4    DDimer  Recent Labs  Lab 03/27/19 0016  DDIMER 1.12*     Radiology/Studies:  Dg Chest 2 View  Result Date: 03/26/2019 CLINICAL DATA:  Shortness of breath EXAM: CHEST - 2 VIEW COMPARISON:  11/11/2014 FINDINGS: Shallow lung inflation with mild cardiomegaly. Remote median sternotomy. No focal airspace consolidation or pulmonary edema. No pleural effusion or pneumothorax. IMPRESSION: No active cardiopulmonary disease. Electronically Signed   By: Deatra Robinson M.D.   On: 03/26/2019 17:22   Dg Abd 1 View  Result Date: 03/26/2019 CLINICAL DATA:  Chronic constipation. EXAM:  ABDOMEN - 1 VIEW COMPARISON:  None. FINDINGS: The bowel gas pattern is normal. No radio-opaque calculi or other significant radiographic abnormality are seen. There is a large stool burden. There are degenerative changes throughout the lumbar spine and bilateral hips. IMPRESSION: Large stool burden. Electronically Signed   By: Katherine Mantle M.D.   On: 03/26/2019 22:54   Vas [place] Lower Extremity Venous (dvt)  Result Date: 03/27/2019  Lower Venous Study Indications: Edema.  Risk Factors: None identified. Limitations: Body habitus, poor ultrasound/tissue interface and patient pain tolerance. Comparison Study: No prior studies. Performing  Technologist: Chanda Busing RVT  Examination Guidelines: A complete evaluation includes B-mode imaging, spectral Doppler, color Doppler, and power Doppler as needed of all accessible portions of each vessel. Bilateral testing is considered an integral part of a complete examination. Limited examinations for reoccurring indications may be performed as noted.  +---------+---------------+---------+-----------+----------+--------------+  RIGHT     Compressibility Phasicity Spontaneity Properties Summary         +---------+---------------+---------+-----------+----------+--------------+  CFV       Full            Yes       Yes                                    +---------+---------------+---------+-----------+----------+--------------+  SFJ       Full                                                             +---------+---------------+---------+-----------+----------+--------------+  FV Prox   Full                                                             +---------+---------------+---------+-----------+----------+--------------+  FV Mid    Full                                                             +---------+---------------+---------+-----------+----------+--------------+  FV Distal                 Yes       Yes                                     +---------+---------------+---------+-----------+----------+--------------+  PFV       Full                                                             +---------+---------------+---------+-----------+----------+--------------+  POP       Full            Yes       Yes                                    +---------+---------------+---------+-----------+----------+--------------+  PTV       Full                                                             +---------+---------------+---------+-----------+----------+--------------+  PERO                                                       Not visualized  +---------+---------------+---------+-----------+----------+--------------+   +---------+---------------+---------+-----------+----------+--------------+  LEFT      Compressibility Phasicity Spontaneity Properties Summary         +---------+---------------+---------+-----------+----------+--------------+  CFV       Full            Yes       Yes                                    +---------+---------------+---------+-----------+----------+--------------+  SFJ       Full                                                             +---------+---------------+---------+-----------+----------+--------------+  FV Prox   Full                                                             +---------+---------------+---------+-----------+----------+--------------+  FV Mid    Full            Yes       Yes                                    +---------+---------------+---------+-----------+----------+--------------+  FV Distal                                                  Not visualized  +---------+---------------+---------+-----------+----------+--------------+  PFV       Full                                                             +---------+---------------+---------+-----------+----------+--------------+  POP       Full            Yes       Yes                                     +---------+---------------+---------+-----------+----------+--------------+  PTV       Full                                                             +---------+---------------+---------+-----------+----------+--------------+  PERO                                                       Not visualized  +---------+---------------+---------+-----------+----------+--------------+     Summary: Right: There is no evidence of deep vein thrombosis in the lower extremity. However, portions of this examination were limited- see technologist comments above. No cystic structure found in the popliteal fossa. Left: There is no evidence of deep vein thrombosis in the lower extremity. However, portions of this examination were limited- see technologist comments above. No cystic structure found in the popliteal fossa.  *See table(s) above for measurements and observations.    Preliminary     Assessment and Plan:   1. Bilateral lower extremity edema She has right greater than left lower extremity edema.  Recently treated with antibiotic for right lower extremity cellulitis with improved redness and swelling.  However no improvement on left lower extremity, actually worsen, now has drainage and ACE wrap applied. -She has failed outpatient Lasix therapy.  She has worsening edema since had a fracture in right lower leg February.  Patient was also seen by orthopedic without specific recommendation. -Reports right leg pain starting from hip to knee.  Vascular ultrasound without DVT however poor study  2.  Acute on hronic diastolic heart failure -Patient has chronic dyspnea due to morbid obesity and on 2 L oxygen 24/7. -BNP normal however nonconclusive in obese patient. -Echocardiogram this admission showed preserved LV function with pseudonormalization.  - Gained 24 lb in past 4 days.  -On IV Lasix 80 mg twice daily without significant diuresis>> will increase to 120mg  BID and  will give metolazone tomorrow 2.5mg .   3.   Carotid artery disease/peripheral arterial disease -Patient is due for carotid arterial Doppler and ABIs. -Follow-up with vascular  4. CAD s/p CABG - Nuclear study December 2019 showed ejection fraction 47%, reversible medium size inferolateral defect suggestive of ischemia.  Treated medically.   - No angina. EKG without acute ischemic changes. Troponin negative.  5. Elevated D-dimer - No DVT but poor study.   For questions or updates, please contact [facility] Please consult [email or web address] for contact info under     Signed, Manson PasseyBhavinkumar Bhagat, PA  03/27/2019 1:42 PM   Agree with note by Chelsea AusVin Bhagat PA-C  We are asked to see Ms. Meredith PelJoines for evaluation treatment of diastolic heart failure.  She is a patient of Dr. Ludwig Clarksrenshaw's.  She does have a history of CAD status post bypass grafting approximately 7 years ago, morbid obesity, PAD with lower extremity occlusive disease as well as carotid artery disease.  She has had progressive weight gain over the last several weeks more prominent over the last several days.  She is on furosemide 80 mg p.o. twice daily at home.  Echo performed today showed normal LV systolic function with diastolic dysfunction.  Venous Dopplers did not reveal evidence of DVT.  She has been treated with furosemide 80 mg IV twice daily without significant diuresis.  She does have 2-3+ pitting edema right greater than left, clear lungs and a low BNP.  She avoids salt at home.  I am going to increase her Lasix to 120 mg IV twice daily and add low-dose Zaroxolyn every morning.  We will follow her output and renal function with you.  Runell GessJonathan J. Cheryn Lundquist, M.D., FACP,  Brock RaFACC, FAHA, FSCAI [facility] [address] [place], [place]  [id number]  [phone] 03/27/2019 2:55 PM

## 2019-03-27 NOTE — Progress Notes (Signed)
Patient arrived to unit [place] bed 10 from emergency room.Patient assisted off stretcher by staff but refusing to get in bed due to pain in both legs.Patient verbalized felt better either sitting up or standing.Patient placed in recliner by staff.Educated patient not to ambulate in room without assistance from staff and verbalized understanding.Oriented patient to nursing unit and call bell system.Patient has dressing to right leg according to patient seen at wound care center.Orders for WOC consult obtained.Will notify MD for orders for pain management and continue to monitor patient.

## 2019-03-27 NOTE — Progress Notes (Signed)
PT Cancellation Note  Patient Details Name: Felicia Rivas MRN: [id number] DOB: 05/18/1956   Cancelled Treatment:    Reason Eval/Treat Not Completed: Medical issues which prohibited therapy.  Pt is waiting for results from an [place] to LE to clear for DVT.  Follow up as pt and condition allow.   Ramond Dial 03/27/2019, 11:17 AM  Mee Hives, PT MS Acute Rehab Dept. Number: [place] and [place]

## 2019-03-27 NOTE — Progress Notes (Signed)
Patient still complaining pain 10/10 in legs with minimal relief from pain pills given.Patient requesting stronger pain medication.Text paged Chaney Malling NP and received order for 2 percocet to be given.Will administer medication and continue to monitor.

## 2019-03-27 NOTE — Progress Notes (Signed)
  Echocardiogram 2D Echocardiogram has been performed.  [name] 03/27/2019, 10:17 AM

## 2019-03-27 NOTE — Progress Notes (Signed)
Inpatient Diabetes Program Recommendations  AACE/ADA: New Consensus Statement on Inpatient Glycemic Control (2015)  Target Ranges:  Prepandial:   less than 140 mg/dL      Peak postprandial:   less than 180 mg/dL (1-2 hours)      Critically ill patients:  140 - 180 mg/dL   Lab Results  Component Value Date   GLUCAP 257 (H) 03/27/2019   HGBA1C 5.8 (H) 03/27/2019    Review of Glycemic Control Results for Felicia Rivas, Felicia Rivas (MRN [id number]) as of 03/27/2019 16:12  Ref. Range 03/27/2019 03:43 03/27/2019 06:10 03/27/2019 11:53 03/27/2019 16:07  Glucose-Capillary Latest Ref Range: 70 - 99 mg/dL 175 (H) 160 (H) 263 (H) 257 (H)   Diabetes history: Type 2 DM Outpatient Diabetes medications: U-500 20 units QAM, 10-15 units QPM Current orders for Inpatient glycemic control: Novolog 0-9 units Q4H, U-500 15 units BID  Inpatient Diabetes Program Recommendations:    Consider increasing U-500 to 20 units QAM.    Noted on admission patient's CBG was 48 mg/dL and A1C was 5.8%  Spoke with patient regarding outpatient DM management. Verified home dosages and confirmed patient checking blood sugars 4 times per day. Patient experienced low blood sugar in the 20's and does not remember much, however does remember not eating lunch. Sees Peri Jefferson, PA for outpatient endocrinology. Patient states, "I know they told me I am on U-500 here, but I don't think I really am. My lunch CBG was 263 mg/dL." Reviewed patient's current A1c of 5.8%. Explained what a A1c is and what it measures. Also reviewed goal A1c with patient, importance of good glucose control @ home, and blood sugar goals. Patient has meter and testing supplies. Reports having occasional low blood sugars, specifically in the middle of the night. Daughter helps patient with hypoglycemia. Patient denies reaching out to endo regarding issue. Discussed when she begins having symptoms. Patient reports feeling symptomatic of lows in the 60's mg/dL. Reviewed syringes  that she uses for U-500. Patient describes typical insulin syringe, NOT the syringe used for U-500 as this syringe is calibrated specifically for U-500 concentrated insulin. Thus, curious if patient may be receiving more insulin than intended. Patient does not have supplies her in hospital, however will plan to show patient correct syringe to ensure safe administration prior to discharge.  Additionally, reviewed [facility] with patient. Discussed risks, benefits, cost and how her daughter could help know CBGS without finger sticks. Order obtained for Dr Broadus John will plan to apply prior to discharge. Patient to follow up with A. Ronnald Ramp, for further orders on Freestyle.    Thanks, Bronson Curb, MSN, RNC-OB Diabetes Coordinator [phone] ([id number])

## 2019-03-27 NOTE — Consult Note (Signed)
[place] Nurse wound consult note Reason for Consult: LE wounds Patient is followed by Dr. Prudy Feeler and the [facility] for LE ulcers on her RLE. She self reports she has had multiple skin grafts  Wound type: venous stasis  Pressure Injury POA: NA Measurement: unable to measure. Wounds are covered with fixed material to protect graft material  Wound bed: covered with silicone layer Drainage (amount, consistency, odor) serous Periwound: intact, palpable distal pulse, 2+ edema Dressing procedure/placement/frequency: Dressings to be left in placed, only changed at the wound care center due to graft placement. This is a weekly visit, she self reports.  Will update EMR to indicate no dressing changes needed, graft material should not be disrupted.   Discussed POC with patient and bedside nurse.  Re consult if needed, will not follow at this time. Thanks  Aubreana Cornacchia [employer], RN,CWOCN, CNS, [place] [phone])

## 2019-03-27 NOTE — Evaluation (Signed)
Physical Therapy Evaluation Patient Details Name: Felicia Rivas MRN: [id number] DOB: 10/01/1955 Today's Date: 03/27/2019   History of Present Illness  Pt is a 63 y/o female with PMH of CAD s/p CABG, asthma, CHF, DM2, HTN, CVA, 2 L O2 as needed. Presenting with SOB, 25 lb weight gain in 4 days and B LE edema. Admitted for CHF exacerbation.    Clinical Impression  Pt is demonstrating ability to get up and walk, with RW and cues for safety with obstacles.  Has received help to manage O2 line, but is able to control walker without that concern.  Was on O2 at home, and will expect that to be ongoing issue.  Pt is near PLOF, and anticipate PT be reordered after her skin breakdown is seen again for another procedure.  Will for now have her continue at home with daughter to assist, and will monitor her to see if HHPT is needed during hospital stay.      Follow Up Recommendations Supervision for mobility/OOB;No PT follow up    Equipment Recommendations  None recommended by PT    Recommendations for Other Services       Precautions / Restrictions Precautions Precautions: Fall Precaution Comments: skin change R lower leg with graft Restrictions Weight Bearing Restrictions: No      Mobility  Bed Mobility               General bed mobility comments: in chair when PT arrived  Transfers Overall transfer level: Needs assistance Equipment used: Rolling walker (2 wheeled);1 person hand held assist Transfers: Sit to/from Stand Sit to Stand: Supervision            Ambulation/Gait Ambulation/Gait assistance: Min guard Gait Distance (Feet): 70 Feet Assistive device: Rolling walker (2 wheeled);1 person hand held assist Gait Pattern/deviations: Step-through pattern;Decreased stride length;Wide base of support Gait velocity: reduced Gait velocity interpretation: <1.31 ft/sec, indicative of household ambulator General Gait Details: pt walked in room as she was very SOB with exertion and  to manage her clothing which was not fully covering for hall walk  Stairs            Wheelchair Mobility    Modified Rankin (Stroke Patients Only)       Balance Overall balance assessment: Needs assistance Sitting-balance support: Feet supported Sitting balance-Leahy Scale: Good     Standing balance support: Bilateral upper extremity supported;During functional activity Standing balance-Leahy Scale: Fair Standing balance comment: RW to minimally control balance                              Pertinent Vitals/Pain Pain Assessment: Faces Faces Pain Scale: Hurts little more(received meds prior to PT arrival) Pain Location: R thigh Pain Descriptors / Indicators: Tender Pain Intervention(s): Limited activity within patient's tolerance;Monitored during session;Premedicated before session;Repositioned    Home Living Family/patient expects to be discharged to:: Private residence Living Arrangements: Children Available Help at Discharge: Family;Available 24 hours/day Type of Home: House Home Access: Ramped entrance     Home Layout: One level Home Equipment: Walker - 2 wheels;Cane - single point;Wheelchair - manual      Prior Function Level of Independence: Independent with assistive device(s)         Comments: uses RW for three rooms of the house     Hand Dominance   Dominant Hand: Right    Extremity/Trunk Assessment   Upper Extremity Assessment Upper Extremity Assessment: Overall WFL for tasks assessed  Lower Extremity Assessment Lower Extremity Assessment: Overall WFL for tasks assessed    Cervical / Trunk Assessment Cervical / Trunk Assessment: Normal  Communication   Communication: No difficulties  Cognition Arousal/Alertness: Awake/alert Behavior During Therapy: WFL for tasks assessed/performed Overall Cognitive Status: Within Functional Limits for tasks assessed                                        General  Comments General comments (skin integrity, edema, etc.): O2 via cannula with longer line in theroom that reaches BR    Exercises     Assessment/Plan    PT Assessment Patient needs continued PT services  PT Problem List Decreased range of motion;Decreased activity tolerance;Decreased balance;Cardiopulmonary status limiting activity;Obesity;Decreased skin integrity;Pain       PT Treatment Interventions DME instruction;Gait training;Functional mobility training;Therapeutic activities;Therapeutic exercise;Balance training;Neuromuscular re-education;Patient/family education    PT Goals (Current goals can be found in the Care Plan section)  Acute Rehab PT Goals Patient Stated Goal: R thigh no longer to hurt PT Goal Formulation: With patient Time For Goal Achievement: 04/10/19 Potential to Achieve Goals: Good    Frequency Min 4X/week   Barriers to discharge   home with her daughter    Co-evaluation               AM-PAC PT "6 Clicks" Mobility  Outcome Measure Help needed turning from your back to your side while in a flat bed without using bedrails?: None Help needed moving from lying on your back to sitting on the side of a flat bed without using bedrails?: None Help needed moving to and from a bed to a chair (including a wheelchair)?: A Little Help needed standing up from a chair using your arms (e.g., wheelchair or bedside chair)?: A Little Help needed to walk in hospital room?: A Little Help needed climbing 3-5 steps with a railing? : A Lot 6 Click Score: 19    End of Session Equipment Utilized During Treatment: Gait belt;Oxygen Activity Tolerance: Treatment limited secondary to medical complications (Comment)(SOB with exertion) Patient left: in chair;with call bell/phone within reach;Other (comment)(walker available to her) Nurse Communication: Mobility status PT Visit Diagnosis: Unsteadiness on feet (R26.81);Difficulty in walking, not elsewhere classified (R26.2)     Time: 7262-0355 PT Time Calculation (min) (ACUTE ONLY): 23 min   Charges:   PT Evaluation $PT Eval Moderate Complexity: 1 Mod PT Treatments $Gait Training: 8-22 mins       Ramond Dial 03/27/2019, 1:12 PM  Mee Hives, PT MS Acute Rehab Dept. Number: [place]

## 2019-03-27 NOTE — Progress Notes (Signed)
[facility] PROGRESS NOTE  Felicia BeckmannSharon Rivas WJX:[id number] DOB: 08/16/1956 DOA: 03/26/2019 PCP: Felicia ChenJohns, [name], MD  Assessment/Plan:  #1. Acute on chronic respiratory failure with hypoxia/ increased oxygen demand related to acute on chronic diastolic heart failure as evidenced by worsening LE edema, bnp only 85. . Patient requiring oxygen 24/7 over last several week whereas was prn. Chest xray no active cardiopulmonary disease. Oxygen saturation level 96% on 3L. Troponin 13>>12. -iv lasix with dose adjusted -continue oxygen supplementation -have requested cards consult assist with diuresis  #2. Acute on chronic diastolic heart failure. Etiology unclear. Reports compliance with med. Lasix increased 11/2018. In May lasix increased to 120mg  in am and 80mg  in afternoon. She reports that dose increased to 120mg  BID. Very little urine output last several days. 20lb weight gain in over several days and worsening LE edema with pain. ekg with SR and ?new LBBB. Troponin flat as noted above. Echo 2016 with EF 60% and grade 2 diastolic dyfunction. Nuclear study showed EF 47%, reversible medium size inferolateral defect suggestive of ischemia treated medically.   Received 60mg  IV lasix in ED. Received 40mg  IV lasix this am. Home dose 120mg  po bid. Only 100ml urine out. (if documentation accurate).  -will increase lasix dose to 80mg  IV bid. Defer best dose to cards -follow echo -daily weights -intake and output -continue home BB -await cards input  #3. Renal insufficiency. Likely related to above. Suspect chronic component as well. Creatinine 1.5 today. Baseline unclear. -monitor closely  -bmet in am -monitor urine output  #4. Diabetes. HgA1c 5.8. Had hypoglycemia in ED. Home meds include concentrated insulin. She reports home dose is 20u in am and 10-15u at night. Appetite good.  -will change dose from 50u to 15 units of concentrated bid -SSI  -diabetes coordinator consult  #5. Constipation. Reports  no bm for 4 days. Reports normal is weekly. Xray with large stool burden. Abdominal distention on exam -milk and molasses enema this am -MOM po daily for 2 days -monitor  #6. Right lower extremity wound . Hx fracture s/p grafts. Dressing clean and dry -wound consult -PT/OT  #7. Asthma. Stable at baseline see above -continue home meds  #8. Morbid obesity. bmi 53.45. weight 331. States base weight 303lbs.  -nutritional consult   Code Status: full Family Communication: patient Disposition Plan: home when ready   Consultants:  cardiology  Procedures:  echo  Antibiotics:    HPI/Subjective: 63 yo hx cad s/p cabg 2012, , chf, carotid stenosis s/p stents, pvd, dm, asthma, renal insufficiency, admitted with acute on chronic resp failure related to acute on chronic chf. Cards on board  Objective: Vitals:   03/27/19 0758 03/27/19 0909  BP: (!) 111/49   Pulse: 74 68  Resp: 20 20  Temp: (!) 97.5 F (36.4 C)   SpO2: 100% 96%    Intake/Output Summary (Last 24 hours) at 03/27/2019 1011 Last data filed at 03/27/2019 0200 Gross per 24 hour  Intake -  Output 100 ml  Net -100 ml   Filed Weights   03/26/19 1808 03/27/19 0638  Weight: (!) 151.5 kg (!) 150.1 kg    Exam:   General:  Obese alert somewhat anxious appearing but no acute distress  Cardiovascular: rrr no mgr 2+ LE edema bilaterally LE very tight and tender to touch  Respiratory: moderate increased work of breathing with conversation. BS distant but clear no wheeze no crackles  Abdomen: obese somewhat tight. +BS no guarding or rebounding  Musculoskeletal: right ankle with dressing dry and  intact   Data Reviewed: Basic Metabolic Panel: Recent Labs  Lab 03/26/19 1645 03/27/19 0348  NA 140 140  K 4.0 4.5  CL 103 100  CO2 26 28  GLUCOSE 48* 194*  BUN 37* 36*  CREATININE 1.40* 1.50*  CALCIUM 9.3 9.2  MG  --  2.5*  PHOS  --  4.0   Liver Function Tests: Recent Labs  Lab 03/27/19 0348  AST 23  ALT  19  ALKPHOS 120  BILITOT 1.1  PROT 7.0  ALBUMIN 3.7   No results for input(s): LIPASE, AMYLASE in the last 168 hours. No results for input(s): AMMONIA in the last 168 hours. CBC: Recent Labs  Lab 03/26/19 1645 03/27/19 0348  WBC 8.5 10.4  HGB 11.7* 11.2*  HCT 37.2 35.6*  MCV 93.9 93.4  PLT 240 240   Cardiac Enzymes: No results for input(s): CKTOTAL, CKMB, CKMBINDEX, TROPONINI in the last 168 hours. BNP (last 3 results) Recent Labs    03/26/19 1826  BNP 85.4    ProBNP (last 3 results) Recent Labs    07/17/18 1123  PROBNP 399*    CBG: Recent Labs  Lab 03/26/19 2205 03/26/19 2359 03/27/19 0343 03/27/19 0610  GLUCAP 111* 94 175* 160*    Recent Results (from the past 240 hour(s))  SARS Coronavirus 2 (CEPHEID - Performed [facility]), Hosp Order     Status: None   Collection Time: 03/26/19  9:04 PM   Specimen: Nasopharyngeal Swab  Result Value Ref Range Status   SARS Coronavirus 2 NEGATIVE NEGATIVE Final    Comment: (NOTE) If result is NEGATIVE SARS-CoV-2 target nucleic acids are NOT DETECTED. The SARS-CoV-2 RNA is generally detectable in upper and lower  respiratory specimens during the acute phase of infection. The lowest  concentration of SARS-CoV-2 viral copies this assay can detect is 250  copies / mL. A negative result does not preclude SARS-CoV-2 infection  and should not be used as the sole basis for treatment or other  patient management decisions.  A negative result may occur with  improper specimen collection / handling, submission of specimen other  than nasopharyngeal swab, presence of viral mutation(s) within the  areas targeted by this assay, and inadequate number of viral copies  (<250 copies / mL). A negative result must be combined with clinical  observations, patient history, and epidemiological information. If result is POSITIVE SARS-CoV-2 target nucleic acids are DETECTED. The SARS-CoV-2 RNA is generally detectable in  upper and lower  respiratory specimens dur ing the acute phase of infection.  Positive  results are indicative of active infection with SARS-CoV-2.  Clinical  correlation with patient history and other diagnostic information is  necessary to determine patient infection status.  Positive results do  not rule out bacterial infection or co-infection with other viruses. If result is PRESUMPTIVE POSTIVE SARS-CoV-2 nucleic acids MAY BE PRESENT.   A presumptive positive result was obtained on the submitted specimen  and confirmed on repeat testing.  While 2019 novel coronavirus  (SARS-CoV-2) nucleic acids may be present in the submitted sample  additional confirmatory testing may be necessary for epidemiological  and / or clinical management purposes  to differentiate between  SARS-CoV-2 and other Sarbecovirus currently known to infect humans.  If clinically indicated additional testing with an alternate test  methodology [phone] is advised. The SARS-CoV-2 RNA is generally  detectable in upper and lower respiratory sp ecimens during the acute  phase of infection. The expected result is Negative. Fact  Sheet for Patients:  StrictlyIdeas.no Fact Sheet for Healthcare Providers: BankingDealers.co.za This test is not yet approved or cleared by the [place] FDA and has been authorized for detection and/or diagnosis of SARS-CoV-2 by FDA under an Emergency Use Authorization (EUA).  This EUA will remain in effect (meaning this test can be used) for the duration of the COVID-19 declaration under Section 564(b)(1) of the Act, 21 U.S.C. section 360bbb-3(b)(1), unless the authorization is terminated or revoked sooner. Performed at [facility], [place] [address], [place], [place] [address]      Studies: Dg Chest 2 View  Result Date: 03/26/2019 CLINICAL DATA:  Shortness of breath EXAM: CHEST - 2 VIEW COMPARISON:  11/11/2014 FINDINGS: Shallow  lung inflation with mild cardiomegaly. Remote median sternotomy. No focal airspace consolidation or pulmonary edema. No pleural effusion or pneumothorax. IMPRESSION: No active cardiopulmonary disease. Electronically Signed   By: Ulyses Jarred M.D.   On: 03/26/2019 17:22   Dg Abd 1 View  Result Date: 03/26/2019 CLINICAL DATA:  Chronic constipation. EXAM: ABDOMEN - 1 VIEW COMPARISON:  None. FINDINGS: The bowel gas pattern is normal. No radio-opaque calculi or other significant radiographic abnormality are seen. There is a large stool burden. There are degenerative changes throughout the lumbar spine and bilateral hips. IMPRESSION: Large stool burden. Electronically Signed   By: Constance Holster M.D.   On: 03/26/2019 22:54    Scheduled Meds: . aspirin EC  325 mg Oral Daily  . atorvastatin  80 mg Oral Daily  . clopidogrel  75 mg Oral Daily  . enoxaparin (LOVENOX) injection  40 mg Subcutaneous Daily  . furosemide  40 mg Intravenous Once  . furosemide  80 mg Intravenous BID  . gabapentin  600 mg Oral TID  . insulin aspart  0-9 Units Subcutaneous Q4H  . insulin regular human CONCENTRATED  15 Units Subcutaneous BID WC  . magnesium hydroxide  30 mL Oral Daily  . metoprolol succinate  25 mg Oral Daily  . milk and molasses  1 enema Rectal Once  . mometasone-formoterol  2 puff Inhalation BID  . potassium chloride SA  40 mEq Oral Daily  . senna  1 tablet Oral BID  . sodium chloride flush  3 mL Intravenous Once  . sodium chloride flush  3 mL Intravenous Q12H   Continuous Infusions: . sodium chloride      Principal Problem:   Acute on chronic diastolic CHF (congestive heart failure) (HCC) Active Problems:   DM2 (diabetes mellitus, type 2) (HCC)   Renal insufficiency   Acute on chronic respiratory failure (HCC)   Asthma   Morbid obesity ([place])   Hypertension   Constipation    Time spent: 36 minutes    [facility] M NP  [facility]  If 7PM-7AM, please contact night-coverage at  [email or web address], password [facility] 03/27/2019, 10:11 AM  LOS: 0 days

## 2019-03-27 NOTE — Evaluation (Signed)
Occupational Therapy Evaluation Patient Details Name: Felicia BeckmannSharon Meixner MRN: [id number] DOB: 03/28/1956 Today's Date: 03/27/2019    History of Present Illness Pt is a 63 y/o female with PMH of CAD s/p CABG, asthma, CHF, DM2, HTN, CVA, 2 L O2 as needed. Presenting with SOB, 25 lb weight gain in 4 days and B LE edema. Admitted for CHF exacerbation.     Clinical Impression   PTA patient reports independent with ADLs (basin bathing only), modified independent using RW for mobility (short distance in hAome, w/c to enter/exit home). Admitted for above and limited by problem list below, including impaired balance, decreased activity tolerance, pain and edema in B LEs.  Patient currently requires min guard for transfers and in room mobility using RW, min guard for standing grooming at sink, setup for UB ADLs and mod-max assist for LB ADLs. VSS during session, on 3L [place] 98% (up from baseline 2L- per pt who has worn 24/7 since feb). Noted R LE ace wrap, reports wound "skin graft".  Patient will benefit from continued OT services while admitted [facility] to maximize independence and safety with ADLs/mobility.     Follow Up Recommendations  Home health OT;Supervision/Assistance - 24 hour    Equipment Recommendations  3 in 1 bedside commode(if she doesnt have one- need to check)    Recommendations for Other Services PT consult     Precautions / Restrictions Precautions Precautions: Fall Restrictions Weight Bearing Restrictions: No      Mobility Bed Mobility               General bed mobility comments: OOB in chair upon arrival  Transfers Overall transfer level: Needs assistance Equipment used: Rolling walker (2 wheeled) Transfers: Sit to/from Stand Sit to Stand: Min guard         General transfer comment: min guard for safety and balance    Balance Overall balance assessment: Needs assistance Sitting-balance support: No upper extremity supported;Feet  supported Sitting balance-Leahy Scale: Good     Standing balance support: Bilateral upper extremity supported;No upper extremity supported;During functional activity Standing balance-Leahy Scale: Fair Standing balance comment: relaint on B UE support dynaimcally, but able to engage in grooming with 0 hand support given min guard statically                           ADL either performed or assessed with clinical judgement   ADL Overall ADL's : Needs assistance/impaired     Grooming: Min guard;Standing   Upper Body Bathing: Set up;Sitting   Lower Body Bathing: Moderate assistance;Sit to/from stand Lower Body Bathing Details (indicate cue type and reason): min guard sit<>stand, decreased reach to B LEs  Upper Body Dressing : Set up;Sitting   Lower Body Dressing: Maximal assistance;Sit to/from stand Lower Body Dressing Details (indicate cue type and reason): min guard sit to stand, total assist to adjust socks due to increased edema and decreased reach to B feet  Toilet Transfer: Min guard;Ambulation;RW Toilet Transfer Details (indicate cue type and reason): simulated to recliner          Functional mobility during ADLs: Min guard;Rolling walker General ADL Comments: pt limited by decreased activity tolerance, increased LE edema, B LE pain, and generalized weakness      Vision         Perception     Praxis      Pertinent Vitals/Pain Pain Assessment: Faces Faces Pain Scale: Hurts even  more Pain Location: B LEs  Pain Descriptors / Indicators: Discomfort;Tightness Pain Intervention(s): Monitored during session;Repositioned;Limited activity within patient's tolerance     Hand Dominance     Extremity/Trunk Assessment Upper Extremity Assessment Upper Extremity Assessment: Generalized weakness   Lower Extremity Assessment Lower Extremity Assessment: Defer to PT evaluation       Communication Communication Communication: No difficulties   Cognition  Arousal/Alertness: Awake/alert Behavior During Therapy: WFL for tasks assessed/performed Overall Cognitive Status: Within Functional Limits for tasks assessed                                     General Comments  pt on 3L supplemental oxgyen via [place] 98% SpO2, HR 85     Exercises     Shoulder Instructions      Home Living Family/patient expects to be discharged to:: Private residence Living Arrangements: Children(daughter) Available Help at Discharge: Family;Available 24 hours/day(reports daughter works from home ) Type of Home: House Home Access: [place]: One level     Bathroom Shower/Tub: (reports completes basin bathing only)   Bathroom Toilet: [place]: Environmental consultant - 2 wheels;Wheelchair - manual   Additional Comments: further DME info needed (NP came to assess pt and unable to get further info)      Prior Functioning/Environment Level of Independence: Independent with assistive device(s)        Comments: reports using wc for mobility in commuinty and into/out of house, short distance mobility with RW in home and independent ADLs (basin bathing only)        OT Problem List: Decreased strength;Decreased activity tolerance;Impaired balance (sitting and/or standing);Decreased knowledge of precautions;Cardiopulmonary status limiting activity;Pain;Increased edema;Obesity      OT Treatment/Interventions: Self-care/ADL training;DME and/or AE instruction;Therapeutic exercise;Therapeutic activities;Patient/family education;Balance training    OT Goals(Current goals can be found in the care plan section) Acute Rehab OT Goals Patient Stated Goal: to have less pain  OT Goal Formulation: With patient Time For Goal Achievement: 04/10/19 Potential to Achieve Goals: Good  OT Frequency: Min 2X/week   Barriers to D/C:            Co-evaluation              AM-PAC OT "6 Clicks" Daily Activity     Outcome Measure  Help from another person eating meals?: None Help from another person taking care of personal grooming?: A Little Help from another person toileting, which includes using toliet, bedpan, or urinal?: A Lot Help from another person bathing (including washing, rinsing, drying)?: A Lot Help from another person to put on and taking off regular upper body clothing?: None Help from another person to put on and taking off regular lower body clothing?: A Lot 6 Click Score: 17   End of Session Equipment Utilized During Treatment: Rolling walker;Oxygen Nurse Communication: Mobility status  Activity Tolerance: Patient tolerated treatment well Patient left: in chair;with call bell/phone within reach;Other (comment)(NP present)  OT Visit Diagnosis: Other abnormalities of gait and mobility (R26.89);Muscle weakness (generalized) (M62.81);Pain Pain - Right/Left: (bil) Pain - part of body: Leg                Time: 1751-0258 OT Time Calculation (min): 21 min Charges:  OT General Charges $OT Visit: 1 Visit OT Evaluation $OT Eval Moderate Complexity: [place], OT Acute Rehabilitation Services Pager [phone] Office  [phone]   Chancy MilroyChristie S Nelsy Madonna 03/27/2019, 8:32 AM

## 2019-03-27 NOTE — Progress Notes (Signed)
Bilateral lower extremity venous duplex has been completed. Preliminary results can be found in CV Proc through chart review.   03/27/19 11:01 AM Felicia Rivas RVT

## 2019-03-28 LAB — BASIC METABOLIC PANEL
Anion gap: 10 (ref 5–15)
BUN: 39 mg/dL — ABNORMAL HIGH (ref 8–23)
CO2: 30 mmol/L (ref 22–32)
Calcium: 9.5 mg/dL (ref 8.9–10.3)
Chloride: 100 mmol/L (ref 98–111)
Creatinine, Ser: 1.45 mg/dL — ABNORMAL HIGH (ref 0.44–1.00)
GFR calc Af Amer: 44 mL/min — ABNORMAL LOW (ref >60)
GFR calc non Af Amer: 38 mL/min — ABNORMAL LOW (ref >60)
Glucose, Bld: 190 mg/dL — ABNORMAL HIGH (ref 70–99)
Potassium: 4.8 mmol/L (ref 3.5–5.1)
Sodium: 140 mmol/L (ref 135–145)

## 2019-03-28 LAB — GLUCOSE, CAPILLARY
Glucose-Capillary: 177 mg/dL — ABNORMAL HIGH (ref 70–99)
Glucose-Capillary: 216 mg/dL — ABNORMAL HIGH (ref 70–99)
Glucose-Capillary: 223 mg/dL — ABNORMAL HIGH (ref 70–99)
Glucose-Capillary: 239 mg/dL — ABNORMAL HIGH (ref 70–99)
Glucose-Capillary: 335 mg/dL — ABNORMAL HIGH (ref 70–99)

## 2019-03-28 MED ORDER — INSULIN REGULAR HUMAN (CONC) 500 UNIT/ML ~~LOC~~ SOPN: 15.0000 [IU] | PEN_INJECTOR | Freq: Every day | SUBCUTANEOUS | Status: DC

## 2019-03-28 MED ORDER — INSULIN REGULAR HUMAN (CONC) 500 UNIT/ML ~~LOC~~ SOPN: 20.0000 [IU] | PEN_INJECTOR | Freq: Every day | SUBCUTANEOUS | Status: DC

## 2019-03-28 MED ORDER — ENOXAPARIN SODIUM 80 MG/0.8ML ~~LOC~~ SOLN
70.0000 mg | Freq: Every day | SUBCUTANEOUS | Status: DC
  Administered 2019-03-29 – 2019-04-05 (×8): 70 mg via SUBCUTANEOUS
  Filled 2019-03-28 (×9): qty 0.8

## 2019-03-28 MED ORDER — ASPIRIN EC 81 MG PO TBEC
81.0000 mg | DELAYED_RELEASE_TABLET | Freq: Every day | ORAL | Status: DC
  Administered 2019-03-28 – 2019-04-05 (×9): 81 mg via ORAL
  Filled 2019-03-28 (×9): qty 1

## 2019-03-28 MED ORDER — OXYCODONE-ACETAMINOPHEN 5-325 MG PO TABS
1.0000 | ORAL_TABLET | Freq: Four times a day (QID) | ORAL | Status: DC | PRN
  Administered 2019-03-28 – 2019-03-30 (×7): 1 via ORAL
  Filled 2019-03-28 (×7): qty 1

## 2019-03-28 MED ORDER — INSULIN REGULAR HUMAN (CONC) 500 UNIT/ML ~~LOC~~ SOPN
25.0000 [IU] | PEN_INJECTOR | Freq: Every day | SUBCUTANEOUS | Status: DC
  Administered 2019-03-29 – 2019-03-31 (×3): 25 [IU] via SUBCUTANEOUS

## 2019-03-28 MED ORDER — INSULIN REGULAR HUMAN (CONC) 500 UNIT/ML ~~LOC~~ SOPN
20.0000 [IU] | PEN_INJECTOR | Freq: Every day | SUBCUTANEOUS | Status: DC
  Administered 2019-03-28 – 2019-03-29 (×2): 20 [IU] via SUBCUTANEOUS

## 2019-03-28 NOTE — Progress Notes (Signed)
Physical Therapy Treatment Patient Details Name: Felicia Rivas MRN: [id number] DOB: 1956-02-03 Today's Date: 03/28/2019    History of Present Illness Pt is a 63 y/o female with PMH of CAD s/p CABG, asthma, CHF, DM2, HTN, CVA, 2 L O2 as needed. Presenting with SOB, 25 lb weight gain in 4 days and B LE edema. Admitted for CHF exacerbation.      PT Comments    Pt pleasant in chair on arrival and reports sleeping in chair because she does not like the bed but does have an adjustable bed at home. Pt able to increase gait distance today and perform HEP although slightly self limiting believing she would not be able to lift her leg with HEp and then could perform with tactile cues. Pt on 2.5 L at rest and 3L during gait with inability to get pulse ox reading with finger probe with pt noting 2/4 DOE. Pt encouraged to continue mobility with nursing staff and declined need for HHPT as she wants to reserve all therapy until after further surgery on RLE.    Follow Up Recommendations  Supervision for mobility/OOB;No PT follow up     Equipment Recommendations  None recommended by PT    Recommendations for Other Services       Precautions / Restrictions Precautions Precautions: Fall Precaution Comments: skin graft RLE Restrictions Weight Bearing Restrictions: No    Mobility  Bed Mobility               General bed mobility comments: in chair on arrival and end of session  Transfers Overall transfer level: Modified independent               General transfer comment: bil UE reliance for transfers  Ambulation/Gait Ambulation/Gait assistance: Supervision Gait Distance (Feet): 100 Feet Assistive device: Rolling walker (2 wheeled) Gait Pattern/deviations: Step-through pattern;Decreased stride length;Wide base of support   Gait velocity interpretation: >2.62 ft/sec, indicative of community ambulatory General Gait Details: pt able to increase ambulation distance with cues for posture  and proximity to [employer] Mobility    Modified Rankin (Stroke Patients Only)       Balance Overall balance assessment: Needs assistance   Sitting balance-Leahy Scale: Good     Standing balance support: Bilateral upper extremity supported;During functional activity Standing balance-Leahy Scale: Poor Standing balance comment: RW for stability                            Cognition Arousal/Alertness: Awake/alert Behavior During Therapy: WFL for tasks assessed/performed Overall Cognitive Status: Within Functional Limits for tasks assessed                                        Exercises General Exercises - Lower Extremity Long Arc Quad: AROM;20 reps;Both;Seated Hip Flexion/Marching: AAROM;20 reps;Both;Seated    General Comments        Pertinent Vitals/Pain Pain Assessment: No/denies pain    Home Living                      Prior Function            PT Goals (current goals can now be found in the care plan section) Progress towards PT goals: Progressing toward goals    Frequency  Min 3X/week      PT Plan Current plan remains appropriate    Co-evaluation              AM-PAC PT "6 Clicks" Mobility   Outcome Measure  Help needed turning from your back to your side while in a flat bed without using bedrails?: A Little Help needed moving from lying on your back to sitting on the side of a flat bed without using bedrails?: A Little Help needed moving to and from a bed to a chair (including a wheelchair)?: A Little Help needed standing up from a chair using your arms (e.g., wheelchair or bedside chair)?: A Little Help needed to walk in hospital room?: A Little Help needed climbing 3-5 steps with a railing? : A Lot 6 Click Score: 17    End of Session Equipment Utilized During Treatment: Oxygen Activity Tolerance: Patient tolerated treatment well Patient left: in chair;with call  bell/phone within reach Nurse Communication: Mobility status PT Visit Diagnosis: Other abnormalities of gait and mobility (R26.89);Unsteadiness on feet (R26.81);Muscle weakness (generalized) (M62.81)     Time: [id number] PT Time Calculation (min) (ACUTE ONLY): 24 min  Charges:  $Gait Training: 8-22 mins $Therapeutic Exercise: 8-22 mins                     Sruthi Maurer Abner Greenspanabor Lymon Kidney, PT Acute Rehabilitation Services Pager: [phone] Office: [phone]    Bastian Andreoli B Janavia Rottman 03/28/2019, 12:03 PM

## 2019-03-28 NOTE — Progress Notes (Signed)
Inpatient Diabetes Program Recommendations  AACE/ADA: New Consensus Statement on Inpatient Glycemic Control (2015)  Target Ranges:  Prepandial:   less than 140 mg/dL      Peak postprandial:   less than 180 mg/dL (1-2 hours)      Critically ill patients:  140 - 180 mg/dL   Lab Results  Component Value Date   GLUCAP 216 (H) 03/28/2019   HGBA1C 5.8 (H) 03/27/2019    Review of Glycemic Control Results for Felicia Rivas, Felicia Rivas (MRN [id number]) as of 03/28/2019 15:56  Ref. Range 03/28/2019 00:35 03/28/2019 05:00 03/28/2019 11:24  Glucose-Capillary Latest Ref Range: 70 - 99 mg/dL 223 (H) 177 (H) 216 (H)   Diabetes history: Type 2 DM Outpatient Diabetes medications: U-500 20 units QAM, 10-15 units QPM Current orders for Inpatient glycemic control: Novolog 0-9 units Q4H, U-500 20 units QAM, 15 units QPM  Inpatient Diabetes Program Recommendations:    Noted patient has not received QAM dose of U-500. Reached out to nursing and per RN the pharmacy had not yet sent this medication up. Questioned this as the patient had received doses yesterday. Unclear of issue, however, discouraged administration of QAM dose at this point because of risk of lows. Encouraged RN to reach out in order to obtain for QHS dose.  Spoke with this patient again regarding insulin syringes as patient had mentioned having occasional lows at home.  Reviewed and demonstrated the correct syringe that she should be using with U-500. Patient continues to describe a typical insulin syringe. Provided a usual syringe for insulin administration and had patient demonstrate how she would draw up the insulin. During this demo, patient showed an administration dose of 15 units in a U-100 (1/2 ml) insulin syringe. U-100 syringes should NOT be used for U-500 administration. For every unit on the U-100 syringe scale is equal to 5 units of U-500. So, if the patient is explaining correctly this would equate to 75 units of U-500 instead of her prescribed dose  of 15-20 units.  In this case, patient prefers insulin pens for U-500 at discharge. This was eliminate the problem and ensure safe administration. Educated patient on insulin pen use at home. Reviewed contents of insulin flexpen starter kit. Reviewed all steps if insulin pen including attachment of needle, 2-unit air shot, dialing up dose, giving injection, removing needle, disposal of sharps, storage of unused insulin, disposal of insulin etc. Patient able to provide successful return demonstration. Also reviewed troubleshooting with insulin pen. MD to give patient Rxs for insulin pens and insulin pen needles.  Still plan to apply Freestyle when appropriate.   Thanks, Bronson Curb, MSN, RNC-OB Diabetes Coordinator [phone] (8a-5p)

## 2019-03-28 NOTE — Plan of Care (Signed)
  Problem: Health Behavior/Discharge Planning: Goal: Ability to manage health-related needs will improve Outcome: Progressing   Problem: Clinical Measurements: Goal: Cardiovascular complication will be avoided Outcome: Progressing   Problem: Pain Managment: Goal: General experience of comfort will improve Outcome: Progressing   

## 2019-03-28 NOTE — Plan of Care (Signed)
  Problem: Education: Goal: Knowledge of General Education information will improve Description Including pain rating scale, medication(s)/side effects and non-pharmacologic comfort measures Outcome: Progressing   Problem: Health Behavior/Discharge Planning: Goal: Ability to manage health-related needs will improve Outcome: Progressing   

## 2019-03-28 NOTE — Progress Notes (Addendum)
Pharmacy Home Medication Reconciliation Communication High Risk Medication: Humulin U-500 Insulin  Home dose of Humulin U-500 insulin was reordered for this patient. The dose was verified with the patient as listed below:  PTA med list says: 20 units in AM and 10-15 units in PM for U-500 insulin.  Diabetes Coordinator spoke with patient who is not using U-500 syringe. Using U-100 syringe so dose is likely higher than prescribed PTA dose.  Plan is to send patient on discharge with insulin pens to help with ease of administration and dosing.   Doses ordered in hospital will be: 25 units in AM and 20 units in PM for U-500 Pen.   Antonietta Jewel, PharmD, [place] Clinical Pharmacist  Pager: [phone] Phone: [phone] 03/28/2019  5:36 PM

## 2019-03-28 NOTE — Progress Notes (Signed)
[facility] PROGRESS NOTE  Felicia BeckmannSharon Rivas JXB:[id number] DOB: 11/12/1955 DOA: 03/26/2019 PCP: [name], [name], MD  Assessment/Plan: #1. Acute on chronic respiratory failure with hypoxia/ increased oxygen demand related to acute on chronic diastolic heart failure as evidenced by worsening LE edema. Improving today. Oxygen saturation level 99% on 2L.vs 96% on 3L. Chest xray no active cardiopulmonary disease. Troponin 13>>12. -iv lasix  -continue oxygen supplementation -wean oxygen as able.   #2. Acute on chronic diastolic heart failure. Etiology unclear. Reports compliance with med. Home dose 120mg  BID. Very little urine output last several days. 20lb weight gain in over several days and worsening LE edema with pain. ekg with SR and ?new LBBB. Troponin flat as noted above. Echo with preserved EF. Evaluated by cardiology who increased lasix 03/27/19 and added metolazone with increased urine output.  -continue IV lasix 120mg  bid -metolazone continued per cards -daily weights -intake and output -continue home BB -await cards input  #3. Renal insufficiency. Likely related to above. Suspect chronic component as well. Creatinine trending down slightly. Baseline unclear. -monitor closely  -bmet in am -monitor urine output  #4. Diabetes. HgA1c 5.8. Had hypoglycemia in ED. Home meds include concentrated insulin. She reports home dose is 20u in am and 10-15u at night. Appetite good.  -will change dose to home regimen.  -SSI  -diabetes coordinator consult  #5. Constipation. Received milk and molasses enema with good results.  -MOM po daily for 2 days -monitor  #6. Right lower extremity wound . Hx fracture s/p grafts. Dressing clean and dry -wound consult -PT/OT  #7. Asthma. Stable at baseline see above -continue home meds  #8. Morbid obesity. bmi 53.45. weight 331. States base weight 303lbs.  -nutritional consult  #9. Right leg/thigh pain. Likely secondary to severe edema related  to #2. Doppler negative for dvt. Less pain full today as diuresing.  -continue IV lasix #10. CAD/cabg. No chest pain. Continue home asa, plavix and BB.    Code Status: full Family Communication: patient Disposition Plan: home when ready   Consultants:  Dr Allyson SabalBerry  Procedures:  echo  Antibiotics:    HPI/Subjective: Sitting in chair reports feeling somewhat better. Breathing easier and noticing decrease in LE edema. Less LE pain.   63 yo hx cad s/p cabg 2012, , chf, carotid stenosis s/p stents, pvd, dm, asthma, renal insufficiency, admitted with acute on chronic resp failure related to acute on chronic chf. Cards on board    Objective: Vitals:   03/28/19 0819 03/28/19 1244  BP: (!) 125/56 (!) 119/32  Pulse: 64 64  Resp: 20 20  Temp: 97.8 F (36.6 C) (!) 97.5 F (36.4 C)  SpO2: 100% 99%    Intake/Output Summary (Last 24 hours) at 03/28/2019 1416 Last data filed at 03/28/2019 1253 Gross per 24 hour  Intake 782 ml  Output 1850 ml  Net -1068 ml   Filed Weights   03/26/19 1808 03/27/19 0638 03/28/19 0726  Weight: (!) 151.5 kg (!) 150.1 kg (!) 150.2 kg    Exam:   General:  Obese sitting in chair. No acute distress  Cardiovascular: rrr no mgr 2+ LE edema, right a little greater than left. Thighs not as tight and less painful, bilateral shins remain tight and painful to touch. No erythema or heat  Respiratory: no increased work of breathing at rest. BS distant no crackles  Abdomen: obese soft +BS no guarding  Musculoskeletal: joints without swelling/erythema   Data Reviewed: Basic Metabolic Panel: Recent Labs  Lab 03/26/19 1645 03/27/19 0348  03/28/19 0520  NA 140 140 140  K 4.0 4.5 4.8  CL 103 100 100  CO2 GLUCOSE 48* 194* 190*  BUN 37* 36* 39*  CREATININE 1.40* 1.50* 1.45*  CALCIUM 9.3 9.2 9.5  MG  --  2.5*  --   PHOS  --  4.0  --    Liver Function Tests: Recent Labs  Lab 03/27/19 0348  AST 23  ALT 19  ALKPHOS 120  BILITOT 1.1   PROT 7.0  ALBUMIN 3.7   No results for input(s): LIPASE, AMYLASE in the last 168 hours. No results for input(s): AMMONIA in the last 168 hours. CBC: Recent Labs  Lab 03/26/19 1645 03/27/19 0348  WBC 8.5 10.4  HGB 11.7* 11.2*  HCT 37.2 35.6*  MCV 93.9 93.4  PLT 240 240   Cardiac Enzymes: No results for input(s): CKTOTAL, CKMB, CKMBINDEX, TROPONINI in the last 168 hours. BNP (last 3 results) Recent Labs    03/26/19 1826  BNP 85.4    ProBNP (last 3 results) Recent Labs    07/17/18 1123  PROBNP 399*    CBG: Recent Labs  Lab 03/27/19 1607 03/27/19 2031 03/28/19 0035 03/28/19 0500 03/28/19 1124  GLUCAP 257* 254* 223* 177* 216*    Recent Results (from the past 240 hour(s))  SARS Coronavirus 2 (CEPHEID - Performed in [facility] hospital lab), Hosp Order     Status: None   Collection Time: 03/26/19  9:04 PM   Specimen: Nasopharyngeal Swab  Result Value Ref Range Status   SARS Coronavirus 2 NEGATIVE NEGATIVE Final    Comment: (NOTE) If result is NEGATIVE SARS-CoV-2 target nucleic acids are NOT DETECTED. The SARS-CoV-2 RNA is generally detectable in upper and lower  respiratory specimens during the acute phase of infection. The lowest  concentration of SARS-CoV-2 viral copies this assay can detect is 250  copies / mL. A negative result does not preclude SARS-CoV-2 infection  and should not be used as the sole basis for treatment or other  patient management decisions.  A negative result may occur with  improper specimen collection / handling, submission of specimen other  than nasopharyngeal swab, presence of viral mutation(s) within the  areas targeted by this assay, and inadequate number of viral copies  (<250 copies / mL). A negative result must be combined with clinical  observations, patient history, and epidemiological information. If result is POSITIVE SARS-CoV-2 target nucleic acids are DETECTED. The SARS-CoV-2 RNA is generally detectable in upper and  lower  respiratory specimens dur ing the acute phase of infection.  Positive  results are indicative of active infection with SARS-CoV-2.  Clinical  correlation with patient history and other diagnostic information is  necessary to determine patient infection status.  Positive results do  not rule out bacterial infection or co-infection with other viruses. If result is PRESUMPTIVE POSTIVE SARS-CoV-2 nucleic acids MAY BE PRESENT.   A presumptive positive result was obtained on the submitted specimen  and confirmed on repeat testing.  While 2019 novel coronavirus  (SARS-CoV-2) nucleic acids may be present in the submitted sample  additional confirmatory testing may be necessary for epidemiological  and / or clinical management purposes  to differentiate between  SARS-CoV-2 and other Sarbecovirus currently known to infect humans.  If clinically indicated additional testing with an alternate test  methodology [phone]) is advised. The SARS-CoV-2 RNA is generally  detectable in upper and lower respiratory sp ecimens during the acute  phase of infection. The expected result  is Negative. Fact Sheet for Patients:  BoilerBrush.com.cyhttps://www.fda.gov/media/136312/download Fact Sheet for Healthcare Providers: https://pope.com/https://www.fda.gov/media/136313/download This test is not yet approved or cleared by the [place] FDA and has been authorized for detection and/or diagnosis of SARS-CoV-2 by FDA under an Emergency Use Authorization (EUA).  This EUA will remain in effect (meaning this test can be used) for the duration of the COVID-19 declaration under Section 564(b)(1) of the Act, 21 U.S.C. section 360bbb-3(b)(1), unless the authorization is terminated or revoked sooner. Performed at [facility], [address] [place], [place] [address]      Studies: Dg Chest 2 View  Result Date: 03/26/2019 CLINICAL DATA:  Shortness of breath EXAM: CHEST - 2 VIEW COMPARISON:  11/11/2014 FINDINGS: Shallow lung  inflation with mild cardiomegaly. Remote median sternotomy. No focal airspace consolidation or pulmonary edema. No pleural effusion or pneumothorax. IMPRESSION: No active cardiopulmonary disease. Electronically Signed   By: Deatra RobinsonKevin  Herman M.D.   On: 03/26/2019 17:22   Dg Abd 1 View  Result Date: 03/26/2019 CLINICAL DATA:  Chronic constipation. EXAM: ABDOMEN - 1 VIEW COMPARISON:  None. FINDINGS: The bowel gas pattern is normal. No radio-opaque calculi or other significant radiographic abnormality are seen. There is a large stool burden. There are degenerative changes throughout the lumbar spine and bilateral hips. IMPRESSION: Large stool burden. Electronically Signed   By: Katherine Mantlehristopher  Green M.D.   On: 03/26/2019 22:54   Vas [place] Lower Extremity Venous (dvt)  Result Date: 03/27/2019  Lower Venous Study Indications: Edema.  Risk Factors: None identified. Limitations: Body habitus, poor ultrasound/tissue interface and patient pain tolerance. Comparison Study: No prior studies. Performing Technologist: Chanda BusingGregory Collins RVT  Examination Guidelines: A complete evaluation includes B-mode imaging, spectral Doppler, color Doppler, and power Doppler as needed of all accessible portions of each vessel. Bilateral testing is considered an integral part of a complete examination. Limited examinations for reoccurring indications may be performed as noted.  +---------+---------------+---------+-----------+----------+--------------+ RIGHT    CompressibilityPhasicitySpontaneityPropertiesSummary        +---------+---------------+---------+-----------+----------+--------------+ CFV      Full           Yes      Yes                                 +---------+---------------+---------+-----------+----------+--------------+ SFJ      Full                                                        +---------+---------------+---------+-----------+----------+--------------+ FV Prox  Full                                                         +---------+---------------+---------+-----------+----------+--------------+ FV Mid   Full                                                        +---------+---------------+---------+-----------+----------+--------------+ FV Distal  Yes      Yes                                 +---------+---------------+---------+-----------+----------+--------------+ PFV      Full                                                        +---------+---------------+---------+-----------+----------+--------------+ POP      Full           Yes      Yes                                 +---------+---------------+---------+-----------+----------+--------------+ PTV      Full                                                        +---------+---------------+---------+-----------+----------+--------------+ PERO                                                  Not visualized +---------+---------------+---------+-----------+----------+--------------+   +---------+---------------+---------+-----------+----------+--------------+ LEFT     CompressibilityPhasicitySpontaneityPropertiesSummary        +---------+---------------+---------+-----------+----------+--------------+ CFV      Full           Yes      Yes                                 +---------+---------------+---------+-----------+----------+--------------+ SFJ      Full                                                        +---------+---------------+---------+-----------+----------+--------------+ FV Prox  Full                                                        +---------+---------------+---------+-----------+----------+--------------+ FV Mid   Full           Yes      Yes                                 +---------+---------------+---------+-----------+----------+--------------+ FV Distal                                             Not visualized  +---------+---------------+---------+-----------+----------+--------------+ PFV      Full                                                        +---------+---------------+---------+-----------+----------+--------------+  POP      Full           Yes      Yes                                 +---------+---------------+---------+-----------+----------+--------------+ PTV      Full                                                        +---------+---------------+---------+-----------+----------+--------------+ PERO                                                  Not visualized +---------+---------------+---------+-----------+----------+--------------+     Summary: Right: There is no evidence of deep vein thrombosis in the lower extremity. However, portions of this examination were limited- see technologist comments above. No cystic structure found in the popliteal fossa. Left: There is no evidence of deep vein thrombosis in the lower extremity. However, portions of this examination were limited- see technologist comments above. No cystic structure found in the popliteal fossa.  *See table(s) above for measurements and observations. Electronically signed by Curt Jews MD on 03/27/2019 at 3:17:22 PM.    Final     Scheduled Meds: . aspirin EC  81 mg Oral Daily  . atorvastatin  80 mg Oral Daily  . clopidogrel  75 mg Oral Daily  . [START ON 03/29/2019] enoxaparin (LOVENOX) injection  70 mg Subcutaneous Daily  . gabapentin  600 mg Oral TID  . insulin aspart  0-9 Units Subcutaneous Q4H  . insulin regular human CONCENTRATED  15 Units Subcutaneous QAC supper  . insulin regular human CONCENTRATED  20 Units Subcutaneous Q breakfast  . metoprolol succinate  25 mg Oral Daily  . mometasone-formoterol  2 puff Inhalation BID  . polyethylene glycol  17 g Oral BID  . potassium chloride SA  40 mEq Oral Daily  . senna  1 tablet Oral BID  . sodium chloride flush  3 mL Intravenous Once  . sodium  chloride flush  3 mL Intravenous Q12H   Continuous Infusions: . sodium chloride    . furosemide Stopped (03/27/19 1835)    Principal Problem:   Acute on chronic diastolic CHF (congestive heart failure) (HCC) Active Problems:   DM2 (diabetes mellitus, type 2) (HCC)   Renal insufficiency   Acute on chronic respiratory failure (HCC)   Asthma   Morbid obesity (HCC)   Hypertension   Constipation   CHF (congestive heart failure) ([place])    Time spent: 45 minutes    [place] NP  [facility]  If 7PM-7AM, please contact night-coverage at [email or web address], password [facility] 03/28/2019, 2:16 PM  LOS: 1 day

## 2019-03-28 NOTE — Progress Notes (Signed)
Pharmacy note: lovenox  63 yo female on lovenox for VTE prophylaxis -wt= 150kg -Hg= 11.2, SCr= 1.45, CrCl ~ 60 Body mass index is 53.44 kg/m.  Plan -With obesity, will adjust lovenox to 70mg  sq q24hr (~ 0.5mg /kg/day)  Hildred Laser, PharmD Clinical Pharmacist **Pharmacist phone directory can now be found on [email or web address] ([email or web address]).  Listed under [place].

## 2019-03-28 NOTE — Progress Notes (Addendum)
Progress Note  Patient Name: Felicia BeckmannSharon Resor Date of Encounter: 03/28/2019  Primary Cardiologist: Olga MillersBrian Crenshaw, MD   Subjective   Breathing and edema improved with increased dose of IV diuresis.  No chest pain.  Inpatient Medications    Scheduled Meds:  aspirin EC  325 mg Oral Daily   atorvastatin  80 mg Oral Daily   clopidogrel  75 mg Oral Daily   enoxaparin (LOVENOX) injection  40 mg Subcutaneous Daily   gabapentin  600 mg Oral TID   insulin aspart  0-9 Units Subcutaneous Q4H   insulin regular human CONCENTRATED  15 Units Subcutaneous QAC supper   insulin regular human CONCENTRATED  20 Units Subcutaneous Q breakfast   magnesium hydroxide  30 mL Oral Daily   metolazone  2.5 mg Oral Once   metoprolol succinate  25 mg Oral Daily   mometasone-formoterol  2 puff Inhalation BID   polyethylene glycol  17 g Oral BID   potassium chloride SA  40 mEq Oral Daily   senna  1 tablet Oral BID   sodium chloride flush  3 mL Intravenous Once   sodium chloride flush  3 mL Intravenous Q12H   Continuous Infusions:  sodium chloride     furosemide Stopped (03/27/19 1835)   PRN Meds: sodium chloride, acetaminophen **OR** acetaminophen, albuterol, bisacodyl, ondansetron **OR** ondansetron (ZOFRAN) IV, sodium chloride flush   Vital Signs    Vitals:   03/28/19 0424 03/28/19 0726 03/28/19 0816 03/28/19 0819  BP: 115/78   (!) 125/56  Pulse: 70  63 64  Resp: 20  20 20   Temp: 97.6 F (36.4 C)   97.8 F (36.6 C)  TempSrc: Oral   Oral  SpO2: 91%  100% 100%  Weight:  (!) 150.2 kg    Height:        Intake/Output Summary (Last 24 hours) at 03/28/2019 0851 Last data filed at 03/28/2019 0819 Gross per 24 hour  Intake 602 ml  Output 1850 ml  Net -1248 ml   Last 3 Weights 03/28/2019 03/27/2019 03/26/2019  Weight (lbs) 331 lb 1.6 oz 331 lb 334 lb  Weight (kg) 150.186 kg 150.141 kg 151.501 kg      Telemetry    Sinus rhythm at controlled ventricular rate,- Personally  Reviewed  ECG    No new tracing  Physical Exam   GEN: No acute distress.   Neck: No JVD Cardiac: RRR, no murmurs, rubs, or gallops.  Respiratory: Clear to auscultation bilaterally. GI: Soft, nontender, non-distended  MS:  3+ edema on right lower extremity and 2+ on left lower extremity neuro:  Nonfocal  Psych: Normal affect   Labs    High Sensitivity Troponin:   Recent Labs  Lab 03/26/19 1953 03/27/19 0016  TROPONINIHS 13 12      Cardiac EnzymesNo results for input(s): TROPONINI in the last 168 hours. No results for input(s): TROPIPOC in the last 168 hours.   Chemistry Recent Labs  Lab 03/26/19 1645 03/27/19 0348 03/28/19 0520  NA 140 140 140  K 4.0 4.5 4.8  CL 103 100 100  CO2 26 28 30   GLUCOSE 48* 194* 190*  BUN 37* 36* 39*  CREATININE 1.40* 1.50* 1.45*  CALCIUM 9.3 9.2 9.5  PROT  --  7.0  --   ALBUMIN  --  3.7  --   AST  --  23  --   ALT  --  19  --   ALKPHOS  --  120  --   BILITOT  --  1.1  --   GFRNONAA 40* 36* 38*  GFRAA 46* 42* 44*  ANIONGAP 11 12 10      Hematology Recent Labs  Lab 03/26/19 1645 03/27/19 0348  WBC 8.5 10.4  RBC 3.96 3.81*  HGB 11.7* 11.2*  HCT 37.2 35.6*  MCV 93.9 93.4  MCH 29.5 29.4  MCHC 31.5 31.5  RDW 14.6 14.4  PLT 240 240    BNP Recent Labs  Lab 03/26/19 1826  BNP 85.4     DDimer  Recent Labs  Lab 03/27/19 0016  DDIMER 1.12*     Radiology    Dg Chest 2 View  Result Date: 03/26/2019 CLINICAL DATA:  Shortness of breath EXAM: CHEST - 2 VIEW COMPARISON:  11/11/2014 FINDINGS: Shallow lung inflation with mild cardiomegaly. Remote median sternotomy. No focal airspace consolidation or pulmonary edema. No pleural effusion or pneumothorax. IMPRESSION: No active cardiopulmonary disease. Electronically Signed   By: Deatra RobinsonKevin  Herman M.D.   On: 03/26/2019 17:22   Dg Abd 1 View  Result Date: 03/26/2019 CLINICAL DATA:  Chronic constipation. EXAM: ABDOMEN - 1 VIEW COMPARISON:  None. FINDINGS: The bowel gas pattern is  normal. No radio-opaque calculi or other significant radiographic abnormality are seen. There is a large stool burden. There are degenerative changes throughout the lumbar spine and bilateral hips. IMPRESSION: Large stool burden. Electronically Signed   By: Katherine Mantlehristopher  Green M.D.   On: 03/26/2019 22:54   Vas [place] Lower Extremity Venous (dvt)  Result Date: 03/27/2019  Lower Venous Study Indications: Edema.  Risk Factors: None identified. Limitations: Body habitus, poor ultrasound/tissue interface and patient pain tolerance. Comparison Study: No prior studies. Performing Technologist: Chanda BusingGregory Collins RVT  Examination Guidelines: A complete evaluation includes B-mode imaging, spectral Doppler, color Doppler, and power Doppler as needed of all accessible portions of each vessel. Bilateral testing is considered an integral part of a complete examination. Limited examinations for reoccurring indications may be performed as noted.  +---------+---------------+---------+-----------+----------+--------------+  RIGHT     Compressibility Phasicity Spontaneity Properties Summary         +---------+---------------+---------+-----------+----------+--------------+  CFV       Full            Yes       Yes                                    +---------+---------------+---------+-----------+----------+--------------+  SFJ       Full                                                             +---------+---------------+---------+-----------+----------+--------------+  FV Prox   Full                                                             +---------+---------------+---------+-----------+----------+--------------+  FV Mid    Full                                                             +---------+---------------+---------+-----------+----------+--------------+  FV Distal                 Yes       Yes                                    +---------+---------------+---------+-----------+----------+--------------+  PFV       Full                                                              +---------+---------------+---------+-----------+----------+--------------+  POP       Full            Yes       Yes                                    +---------+---------------+---------+-----------+----------+--------------+  PTV       Full                                                             +---------+---------------+---------+-----------+----------+--------------+  PERO                                                       Not visualized  +---------+---------------+---------+-----------+----------+--------------+   +---------+---------------+---------+-----------+----------+--------------+  LEFT      Compressibility Phasicity Spontaneity Properties Summary         +---------+---------------+---------+-----------+----------+--------------+  CFV       Full            Yes       Yes                                    +---------+---------------+---------+-----------+----------+--------------+  SFJ       Full                                                             +---------+---------------+---------+-----------+----------+--------------+  FV Prox   Full                                                             +---------+---------------+---------+-----------+----------+--------------+  FV Mid    Full            Yes       Yes                                    +---------+---------------+---------+-----------+----------+--------------+  FV Distal                                                  Not visualized  +---------+---------------+---------+-----------+----------+--------------+  PFV       Full                                                             +---------+---------------+---------+-----------+----------+--------------+  POP       Full            Yes       Yes                                    +---------+---------------+---------+-----------+----------+--------------+  PTV       Full                                                              +---------+---------------+---------+-----------+----------+--------------+  PERO                                                       Not visualized  +---------+---------------+---------+-----------+----------+--------------+     Summary: Right: There is no evidence of deep vein thrombosis in the lower extremity. However, portions of this examination were limited- see technologist comments above. No cystic structure found in the popliteal fossa. Left: There is no evidence of deep vein thrombosis in the lower extremity. However, portions of this examination were limited- see technologist comments above. No cystic structure found in the popliteal fossa.  *See table(s) above for measurements and observations. Electronically signed by Curt Jews MD on 03/27/2019 at 3:17:22 PM.    Final     Cardiac Studies   Echo 03/27/2019 IMPRESSIONS  1. The left ventricle has normal systolic function, with an ejection fraction of 55-60%. The cavity size was mildly dilated. There is moderately increased left ventricular wall thickness. Left ventricular diastolic Doppler parameters are consistent with pseudonormalization. Elevated 2. The right ventricle has normal systolic function. The cavity was normal. There is no increase in right ventricular wall thickness. 3. The mitral valve is grossly normal. There is mild mitral annular calcification present. Trivial MR. 4. The aortic valve was not well visualized. 5. Left atrial size was moderately dilated.   Patient Profile     Moani Weipert is a 63 y.o. female with a hx of CAD s/p CABG x 3, Chronic diastolic CHF, morbid obesity, DM, HTN, carotid artery disease s/p right carotid stent July 2018 / PAD followed by vascular (Dr. Donzetta Matters) and  HLD who is being seen for the evaluation of CHF.   Assessment & Plan    1.Acute on chronic diastolic heart failure Failed outpatient therapy.  Treated for cellulitis on right lower extremity. -Edema and  breathing improving  with IV Lasix  twice daily.  Diurese 1.3 L so far. Weigh down 3lb. Renal function improving. -Continue IV Lasix 120 mg twice daily.  Give metolazone 2.5 mg today (reassess in AM).  -On supplemental potassium. -Continue beta-blocker.  2. CAD s/p CABG - No angina. Troponin negative.   3.  Carotid artery disease/peripheral arterial disease -Patient is due for carotid arterial Doppler and ABIs. -Follow-up with vascular    For questions or updates, please contact [facility] Please consult [email or web address] for contact info under        Signed, Manson Passey, PA  03/28/2019, 8:51 AM    Agree with note by Chelsea Aus PA-C  Ms. Kegg diuresed 1.3 L with increasing dose of furosemide 120 mg IV twice daily.  We will add low-dose Zaroxolyn this morning.  She feels clinically improved.  Her LV function is normal with diastolic dysfunction.  Her peripheral edema is somewhat improved as well.  Lungs are clear.  Continue gentle diuresis down to dry weight.  Runell Gess, M.D., FACP, [facility], [facility] [address] [place], [place]  [address]  [phone] 03/28/2019 10:38 AM

## 2019-03-29 LAB — BASIC METABOLIC PANEL
Anion gap: 10 (ref 5–15)
BUN: 38 mg/dL — ABNORMAL HIGH (ref 8–23)
CO2: 31 mmol/L (ref 22–32)
Calcium: 9.7 mg/dL (ref 8.9–10.3)
Chloride: 98 mmol/L (ref 98–111)
Creatinine, Ser: 1.32 mg/dL — ABNORMAL HIGH (ref 0.44–1.00)
GFR calc Af Amer: 49 mL/min — ABNORMAL LOW (ref >60)
GFR calc non Af Amer: 43 mL/min — ABNORMAL LOW (ref >60)
Glucose, Bld: 168 mg/dL — ABNORMAL HIGH (ref 70–99)
Potassium: 4.7 mmol/L (ref 3.5–5.1)
Sodium: 139 mmol/L (ref 135–145)

## 2019-03-29 LAB — GLUCOSE, CAPILLARY
Glucose-Capillary: 168 mg/dL — ABNORMAL HIGH (ref 70–99)
Glucose-Capillary: 174 mg/dL — ABNORMAL HIGH (ref 70–99)
Glucose-Capillary: 206 mg/dL — ABNORMAL HIGH (ref 70–99)
Glucose-Capillary: 215 mg/dL — ABNORMAL HIGH (ref 70–99)
Glucose-Capillary: 240 mg/dL — ABNORMAL HIGH (ref 70–99)
Glucose-Capillary: 281 mg/dL — ABNORMAL HIGH (ref 70–99)

## 2019-03-29 MED ORDER — METOLAZONE 2.5 MG PO TABS
2.5000 mg | ORAL_TABLET | Freq: Once | ORAL | Status: AC
  Administered 2019-03-30: 2.5 mg via ORAL
  Filled 2019-03-29: qty 1

## 2019-03-29 MED ORDER — INSULIN ASPART 100 UNIT/ML ~~LOC~~ SOLN
0.0000 [IU] | Freq: Every day | SUBCUTANEOUS | Status: DC
  Administered 2019-03-29 – 2019-03-30 (×2): 3 [IU] via SUBCUTANEOUS
  Administered 2019-03-31 – 2019-04-02 (×3): 5 [IU] via SUBCUTANEOUS

## 2019-03-29 MED ORDER — INSULIN ASPART 100 UNIT/ML ~~LOC~~ SOLN
0.0000 [IU] | Freq: Three times a day (TID) | SUBCUTANEOUS | Status: DC
  Administered 2019-03-29 (×2): 3 [IU] via SUBCUTANEOUS
  Administered 2019-03-30: 9 [IU] via SUBCUTANEOUS
  Administered 2019-03-30: 3 [IU] via SUBCUTANEOUS
  Administered 2019-03-30 – 2019-03-31 (×2): 5 [IU] via SUBCUTANEOUS
  Administered 2019-03-31: 7 [IU] via SUBCUTANEOUS
  Administered 2019-03-31: 5 [IU] via SUBCUTANEOUS
  Administered 2019-04-01: 9 [IU] via SUBCUTANEOUS
  Administered 2019-04-01: 7 [IU] via SUBCUTANEOUS
  Administered 2019-04-01: 9 [IU] via SUBCUTANEOUS
  Administered 2019-04-02: 5 [IU] via SUBCUTANEOUS
  Administered 2019-04-02: 7 [IU] via SUBCUTANEOUS
  Administered 2019-04-02: 9 [IU] via SUBCUTANEOUS
  Administered 2019-04-03 (×2): 7 [IU] via SUBCUTANEOUS

## 2019-03-29 NOTE — Progress Notes (Signed)
Occupational Therapy Treatment Patient Details Name: Felicia BeckmannSharon Rivas MRN: [id number] DOB: 03/04/1956 Today's Date: 03/29/2019    History of present illness Pt is a 63 y/o female with PMH of CAD s/p CABG, asthma, CHF, DM2, HTN, CVA, 2 L O2 as needed. Presenting with SOB, 25 lb weight gain in 4 days and B LE edema. Admitted for CHF exacerbation.     OT comments  Pt is progressing toward goals.  She is able to perform ADLs with supervision - min A.  She is able to tolerate standing x ~ 5 mins for self care activities with DOE 2/4 - she is approaching her baseline.  She was instructed in UE HEP, and returned demonstration with min cues.   Follow Up Recommendations  Home health OT;Supervision/Assistance - 24 hour    Equipment Recommendations  3 in 1 bedside commode    Recommendations for Other Services      Precautions / Restrictions Precautions Precautions: Fall Precaution Comments: skin graft RLE       Mobility Bed Mobility               General bed mobility comments: in chair on arrival and end of session  Transfers Overall transfer level: Modified independent                    Balance Overall balance assessment: Needs assistance Sitting-balance support: Feet supported Sitting balance-Leahy Scale: Good     Standing balance support: Single extremity supported;During functional activity Standing balance-Leahy Scale: Poor Standing balance comment: requires UE support                            ADL either performed or assessed with clinical judgement   ADL Overall ADL's : Needs assistance/impaired     Grooming: Wash/dry hands;Wash/dry face;Oral care;Brushing hair;Standing;Supervision/safety Grooming Details (indicate cue type and reason): able to maintain standing x 6 mins for grooming with supervision                   Toilet Transfer: RW;Stand-pivot;BSC;Modified Independent           Functional mobility during ADLs: Min  guard;Supervision/safety;Rolling walker       Vision       Perception     Praxis      Cognition Arousal/Alertness: Awake/alert Behavior During Therapy: WFL for tasks assessed/performed Overall Cognitive Status: Within Functional Limits for tasks assessed                                          Exercises Other Exercises Other Exercises: Pt instructed in chair push ups in prep and instructed to perform at home in prep for her Rt LE surgery.  She was able to perform 10 reps with min cues for technique    Shoulder Instructions       General Comments      Pertinent Vitals/ Pain       Pain Assessment: Faces Faces Pain Scale: Hurts little more Pain Location: back - chronic  Pain Descriptors / Indicators: Aching;Grimacing;Guarding Pain Intervention(s): Monitored during session  Home Living                                          Prior Functioning/Environment  Frequency  Min 2X/week        Progress Toward Goals  OT Goals(current goals can now be found in the care plan section)  Progress towards OT goals: Progressing toward goals     Plan Discharge plan remains appropriate    Co-evaluation                 AM-PAC OT "6 Clicks" Daily Activity     Outcome Measure   Help from another person eating meals?: None Help from another person taking care of personal grooming?: A Little Help from another person toileting, which includes using toliet, bedpan, or urinal?: None Help from another person bathing (including washing, rinsing, drying)?: A Little Help from another person to put on and taking off regular upper body clothing?: None Help from another person to put on and taking off regular lower body clothing?: A Little 6 Click Score: 21    End of Session Equipment Utilized During Treatment: Rolling walker;Oxygen  OT Visit Diagnosis: Muscle weakness (generalized) (M62.81) Pain - part of body: (back )    Activity Tolerance Patient tolerated treatment well   Patient Left in chair;with call bell/phone within reach   Nurse Communication          Time: 9702-6378 OT Time Calculation (min): 21 min  Charges: OT General Charges $OT Visit: 1 Visit OT Treatments $Therapeutic Activity: 8-22 mins  Lucille Passy, OTR/L [place] Pager [phone] Office [phone]    Lucille Passy M 03/29/2019, 3:27 PM

## 2019-03-29 NOTE — Progress Notes (Signed)
Progress Note  Patient Name: Felicia BeckmannSharon Rivas Date of Encounter: 03/29/2019  Primary Cardiologist: Olga MillersBrian Crenshaw, MD   Subjective   Breathing and edema improved with increased dose of IV diuresis.  No chest pain.  Inpatient Medications    Scheduled Meds:  aspirin EC  81 mg Oral Daily   atorvastatin  80 mg Oral Daily   clopidogrel  75 mg Oral Daily   enoxaparin (LOVENOX) injection  70 mg Subcutaneous Daily   gabapentin  600 mg Oral TID   insulin aspart  0-5 Units Subcutaneous QHS   insulin aspart  0-9 Units Subcutaneous TID WC   insulin regular human CONCENTRATED  20 Units Subcutaneous QAC supper   insulin regular human CONCENTRATED  25 Units Subcutaneous Q breakfast   [START ON 03/30/2019] metolazone  2.5 mg Oral Once   metoprolol succinate  25 mg Oral Daily   mometasone-formoterol  2 puff Inhalation BID   potassium chloride SA  40 mEq Oral Daily   senna  1 tablet Oral BID   sodium chloride flush  3 mL Intravenous Once   sodium chloride flush  3 mL Intravenous Q12H   Continuous Infusions:  sodium chloride 250 mL (03/29/19 0745)   furosemide 120 mg (03/29/19 0745)   PRN Meds: sodium chloride, acetaminophen **OR** acetaminophen, albuterol, bisacodyl, ondansetron **OR** ondansetron (ZOFRAN) IV, oxyCODONE-acetaminophen, sodium chloride flush   Vital Signs    Vitals:   03/29/19 0405 03/29/19 0555 03/29/19 0821 03/29/19 0945  BP: (!) 151/67   116/60  Pulse: 67  67 70  Resp: 20  16 16   Temp: 97.6 F (36.4 C)     TempSrc: Oral     SpO2: 99%  98% 98%  Weight:  (!) 148.1 kg    Height:        Intake/Output Summary (Last 24 hours) at 03/29/2019 1033 Last data filed at 03/29/2019 1026 Gross per 24 hour  Intake 1270 ml  Output 3650 ml  Net -2380 ml   Last 3 Weights 03/29/2019 03/28/2019 03/27/2019  Weight (lbs) 326 lb 9.6 oz 331 lb 1.6 oz 331 lb  Weight (kg) 148.145 kg 150.186 kg 150.141 kg      Telemetry    Sinus rhythm at controlled ventricular rate,-  Personally Reviewed  ECG    No new tracing  Physical Exam   GEN: No acute distress.   Neck: No JVD Cardiac: RRR, no murmurs, rubs, or gallops.  Respiratory: Clear to auscultation bilaterally. GI: Soft, nontender, non-distended  MS:  3+ edema on right lower extremity and 2+ on left lower extremity neuro:  Nonfocal  Psych: Normal affect   Labs    High Sensitivity Troponin:   Recent Labs  Lab 03/26/19 1953 03/27/19 0016  TROPONINIHS 13 12      Cardiac EnzymesNo results for input(s): TROPONINI in the last 168 hours. No results for input(s): TROPIPOC in the last 168 hours.   Chemistry Recent Labs  Lab 03/27/19 0348 03/28/19 0520 03/29/19 0536  NA 140 140 139  K 4.5 4.8 4.7  CL 100 100 98  CO2 28 30 31   GLUCOSE 194* 190* 168*  BUN 36* 39* 38*  CREATININE 1.50* 1.45* 1.32*  CALCIUM 9.2 9.5 9.7  PROT 7.0  --   --   ALBUMIN 3.7  --   --   AST 23  --   --   ALT 19  --   --   ALKPHOS 120  --   --   BILITOT 1.1  --   --  GFRNONAA 36* 38* 43*  GFRAA 42* 44* 49*  ANIONGAP 12 10 10      Hematology Recent Labs  Lab 03/26/19 1645 03/27/19 0348  WBC 8.5 10.4  RBC 3.96 3.81*  HGB 11.7* 11.2*  HCT 37.2 35.6*  MCV 93.9 93.4  MCH 29.5 29.4  MCHC 31.5 31.5  RDW 14.6 14.4  PLT 240 240    BNP Recent Labs  Lab 03/26/19 1826  BNP 85.4     DDimer  Recent Labs  Lab 03/27/19 0016  DDIMER 1.12*     Radiology    Vas [place] Lower Extremity Venous (dvt)  Result Date: 03/27/2019  Lower Venous Study Indications: Edema.  Risk Factors: None identified. Limitations: Body habitus, poor ultrasound/tissue interface and patient pain tolerance. Comparison Study: No prior studies. Performing Technologist: Oliver Hum RVT  Examination Guidelines: A complete evaluation includes B-mode imaging, spectral Doppler, color Doppler, and power Doppler as needed of all accessible portions of each vessel. Bilateral testing is considered an integral part of a complete examination. Limited  examinations for reoccurring indications may be performed as noted.  +---------+---------------+---------+-----------+----------+--------------+  RIGHT     Compressibility Phasicity Spontaneity Properties Summary         +---------+---------------+---------+-----------+----------+--------------+  CFV       Full            Yes       Yes                                    +---------+---------------+---------+-----------+----------+--------------+  SFJ       Full                                                             +---------+---------------+---------+-----------+----------+--------------+  FV Prox   Full                                                             +---------+---------------+---------+-----------+----------+--------------+  FV Mid    Full                                                             +---------+---------------+---------+-----------+----------+--------------+  FV Distal                 Yes       Yes                                    +---------+---------------+---------+-----------+----------+--------------+  PFV       Full                                                             +---------+---------------+---------+-----------+----------+--------------+  POP       Full            Yes       Yes                                    +---------+---------------+---------+-----------+----------+--------------+  PTV       Full                                                             +---------+---------------+---------+-----------+----------+--------------+  PERO                                                       Not visualized  +---------+---------------+---------+-----------+----------+--------------+   +---------+---------------+---------+-----------+----------+--------------+  LEFT      Compressibility Phasicity Spontaneity Properties Summary         +---------+---------------+---------+-----------+----------+--------------+  CFV       Full            Yes       Yes                                     +---------+---------------+---------+-----------+----------+--------------+  SFJ       Full                                                             +---------+---------------+---------+-----------+----------+--------------+  FV Prox   Full                                                             +---------+---------------+---------+-----------+----------+--------------+  FV Mid    Full            Yes       Yes                                    +---------+---------------+---------+-----------+----------+--------------+  FV Distal                                                  Not visualized  +---------+---------------+---------+-----------+----------+--------------+  PFV       Full                                                             +---------+---------------+---------+-----------+----------+--------------+  POP       Full            Yes       Yes                                    +---------+---------------+---------+-----------+----------+--------------+  PTV       Full                                                             +---------+---------------+---------+-----------+----------+--------------+  PERO                                                       Not visualized  +---------+---------------+---------+-----------+----------+--------------+     Summary: Right: There is no evidence of deep vein thrombosis in the lower extremity. However, portions of this examination were limited- see technologist comments above. No cystic structure found in the popliteal fossa. Left: There is no evidence of deep vein thrombosis in the lower extremity. However, portions of this examination were limited- see technologist comments above. No cystic structure found in the popliteal fossa.  *See table(s) above for measurements and observations. Electronically signed by Gretta Beganodd Early MD on 03/27/2019 at 3:17:22 PM.    Final     Cardiac Studies   Echo 03/27/2019 IMPRESSIONS  1. The left  ventricle has normal systolic function, with an ejection fraction of 55-60%. The cavity size was mildly dilated. There is moderately increased left ventricular wall thickness. Left ventricular diastolic Doppler parameters are consistent with pseudonormalization. Elevated 2. The right ventricle has normal systolic function. The cavity was normal. There is no increase in right ventricular wall thickness. 3. The mitral valve is grossly normal. There is mild mitral annular calcification present. Trivial MR. 4. The aortic valve was not well visualized. 5. Left atrial size was moderately dilated.   Patient Profile     Felicia BeckmannSharon Rivas is a 63 y.o. female with a hx of CAD s/p CABG x 3, Chronic diastolic CHF, morbid obesity, DM, HTN, carotid artery disease s/p right carotid stent July 2018 / PAD followed by vascular (Dr. Randie Heinzain) and  HLD who is being seen for the evaluation of CHF.   Assessment & Plan    1.Acute on chronic diastolic heart failure Failed outpatient therapy.  Treated for cellulitis on right lower extremity. -Edema and breathing improving with IV Lasix 120mg  twice daily.  Diurese 3 L so far. Weigh down 8lb. Renal function improving. -Continue IV Lasix 120 mg twice daily.  Give metolazone 2.5 mg again tomorrow. -On supplemental potassium. -Continue beta-blocker.  2. CAD s/p CABG - No angina. Troponin negative.   3.  Carotid artery disease/peripheral arterial disease -Patient is due for carotid arterial Doppler and ABIs. -Follow-up with vascular    For questions or updates, please contact [facility] Please consult [email or web address] for contact info under        Signed, Nanetta BattyJonathan Terrace Chiem, MD  03/29/2019, 10:33 AM

## 2019-03-29 NOTE — Progress Notes (Addendum)
Inpatient Diabetes Program Recommendations  AACE/ADA: New Consensus Statement on Inpatient Glycemic Control (2015)  Target Ranges:  Prepandial:   less than 140 mg/dL      Peak postprandial:   less than 180 mg/dL (1-2 hours)      Critically ill patients:  140 - 180 mg/dL   Lab Results  Component Value Date   GLUCAP 174 (H) 03/29/2019   HGBA1C 5.8 (H) 03/27/2019    Review of Glycemic Control Results for Felicia Rivas, Felicia Rivas (MRN [id number]) as of 03/29/2019 09:25  Ref. Range 03/28/2019 20:46 03/29/2019 00:54 03/29/2019 04:03 03/29/2019 07:31  Glucose-Capillary Latest Ref Range: 70 - 99 mg/dL 335 (H) 206 (H) 168 (H) 174 (H)   Diabetes history:Type 2 DM Outpatient Diabetes medications:U-500 20 units QAM, 10-15 units QPM Current orders for Inpatient glycemic control:Novolog 0-9 units Q4H, U-500 25 units QAM, 20 units QPM  Inpatient Diabetes Program Recommendations:  Noted CBG of 335 mg/dL yesterday evening, assuming this was related to patient missing QAM dose of U-500. Noted changes this AM.   MD consider switching correction to Novolog 0-9 units TID & Hs since patient is eating.   From note on 7/9- "During this demo, patient showed an administration dose of 15 units in a U-100 (1/2 ml) insulin syringe. U-100 syringes should NOT be used for U-500 administration. For every unit on the U-100 syringe scale is equal to 5 units of U-500. So, if the patient is explaining correctly this would equate to 75 units of U-500 instead of her prescribed dose of 15-20 units.  In this case, patient prefers insulin pens for U-500 at discharge. This was eliminate the problem and ensure safe administration. MD to give patient Rxs for insulin pens and insulin pen needles."  Addendum @1450 : Freestyle Libre applied. Provided additional sensors. Patient was educated on how to use, apply, and check CBGs. Encouraged to continue to monitor and set target goals at 120-180 mg/dL. Encouraged patient to take with her to  next PCP visit and to Autumn Jones, endo and to reach out with concerns of hypo/hyperglycemia exceeding target goals. Did discuss with patient the importance of ensuring she gets correct injection method given U-500 and review again the importance is to avoid hypoglycemia. Patient has no further questions at this time.  Thanks, Bronson Curb, MSN, RNC-OB Diabetes Coordinator [phone] (8a-5p)

## 2019-03-29 NOTE — Progress Notes (Signed)
[facility] PROGRESS NOTE  Shakayla Hickox ZOX:[id number] DOB: 1956-06-06 DOA: 03/26/2019 PCP: [name], MD  Assessment/Plan: #1. Acute on chronic respiratory failure with hypoxia/ increased oxygen demand related to acute on chronic diastolic heart failure as evidenced by worsening LE edema. Oxygen saturation level 99% on 3L. This may be her new normal.  Chest xray no active cardiopulmonary disease. Troponin 13>>12. - continue iv lasix  -continue oxygen supplementation try to wean to 2L   #2. Acute on chronic diastolic heart failure. Echo with preserved EF. Volume status -1.7L. weight down to 326lbs from 334lb. Less swelling less pain. Creatinine continues to trend down.  -continue IV lasix  bid -metolazone continued per cards -daily weights -intake and output -continue home BB -await cards input  #3. Renal insufficiency. Likely related to above. Suspect chronic component as well. Creatinine trending down slightly. Baseline unclear. -monitor closely  -bmet in am -monitor urine output  #4. Diabetes. HgA1c 5.8. appreciate diabetes coordinator input.   -will change dose to home regimen.  -SSI  -diabetes coordinator consult  #5. Constipation. Received milk and molasses enema with good results.  -bowel regimen --monitor  #6. Right lower extremity wound . Hx fracture s/p grafts. Dressing clean and dry -wound consult -PT/OT -OP follow up with ortho for repeat surgery  #7. Asthma. Stable at baseline see above -continue home meds  #8. Morbid obesity. bmi 53.45. weight 331. States base weight 303lbs.  -nutritional consult  #9. Right leg/thigh pain. Likely secondary to severe edema related to #2. Doppler negative for dvt. Less pain full today as diuresing.  -continue IV lasix #10. CAD/cabg. No chest pain. Continue home asa, plavix and BB.      Code Status: full Family Communication: patient Disposition Plan: home when ready likely 2-3  days   Consultants:  Dr berry cardiology  Procedures:  echo  Antibiotics:    HPI/Subjective: Sitting in chair eating breakfast. Reports improved breathing and less pain in legs as well as continued decrease in edema  Objective: Vitals:   03/29/19 0405 03/29/19 0821  BP: (!) 151/67   Pulse: 67 67  Resp: 20 16  Temp: 97.6 F (36.4 C)   SpO2: 99% 98%    Intake/Output Summary (Last 24 hours) at 03/29/2019 8119 Last data filed at 03/29/2019 0858 Gross per 24 hour  Intake 1270 ml  Output 3150 ml  Net -1880 ml   Filed Weights   03/27/19 1478 03/28/19 0726 03/29/19 0555  Weight: (!) 150.1 kg (!) 150.2 kg (!) 148.1 kg    Exam:   General:  Sitting up smiling no acute distress  Cardiovascular: rrr no mgr 2+LE edema bilaterally thighs softer less tender to palpation  Respiratory: normal effort BS with good air movement no crackles  Abdomen: obese soft +BS no guarding or rebounding  Musculoskeletal: joints without swelling/erythem. Dressing right ankle dry and intact.    Data Reviewed: Basic Metabolic Panel: Recent Labs  Lab 03/26/19 1645 03/27/19 0348 03/28/19 0520 03/29/19 0536  NA 140 140 140 139  K 4.0 4.5 4.8 4.7  CL 103 100 100 98  CO2 GLUCOSE 48* 194* 190* 168*  BUN 37* 36* 39* 38*  CREATININE 1.40* 1.50* 1.45* 1.32*  CALCIUM 9.3 9.2 9.5 9.7  MG  --  2.5*  --   --   PHOS  --  4.0  --   --    Liver Function Tests: Recent Labs  Lab 03/27/19 0348  AST 23  ALT 19  ALKPHOS 120  BILITOT 1.1  PROT 7.0  ALBUMIN 3.7   No results for input(s): LIPASE, AMYLASE in the last 168 hours. No results for input(s): AMMONIA in the last 168 hours. CBC: Recent Labs  Lab 03/26/19 1645 03/27/19 0348  WBC 8.5 10.4  HGB 11.7* 11.2*  HCT 37.2 35.6*  MCV 93.9 93.4  PLT 240 240   Cardiac Enzymes: No results for input(s): CKTOTAL, CKMB, CKMBINDEX, TROPONINI in the last 168 hours. BNP (last 3 results) Recent Labs    03/26/19 1826  BNP 85.4     ProBNP (last 3 results) Recent Labs    07/17/18 1123  PROBNP 399*    CBG: Recent Labs  Lab 03/28/19 1614 03/28/19 2046 03/29/19 0054 03/29/19 0403 03/29/19 0731  GLUCAP 239* 335* 206* 168* 174*    Recent Results (from the past 240 hour(s))  SARS Coronavirus 2 (CEPHEID - Performed in [facility]), Hosp Order     Status: None   Collection Time: 03/26/19  9:04 PM   Specimen: Nasopharyngeal Swab  Result Value Ref Range Status   SARS Coronavirus 2 NEGATIVE NEGATIVE Final    Comment: (NOTE) If result is NEGATIVE SARS-CoV-2 target nucleic acids are NOT DETECTED. The SARS-CoV-2 RNA is generally detectable in upper and lower  respiratory specimens during the acute phase of infection. The lowest  concentration of SARS-CoV-2 viral copies this assay can detect is 250  copies / mL. A negative result does not preclude SARS-CoV-2 infection  and should not be used as the sole basis for treatment or other  patient management decisions.  A negative result may occur with  improper specimen collection / handling, submission of specimen other  than nasopharyngeal swab, presence of viral mutation(s) within the  areas targeted by this assay, and inadequate number of viral copies  (<250 copies / mL). A negative result must be combined with clinical  observations, patient history, and epidemiological information. If result is POSITIVE SARS-CoV-2 target nucleic acids are DETECTED. The SARS-CoV-2 RNA is generally detectable in upper and lower  respiratory specimens dur ing the acute phase of infection.  Positive  results are indicative of active infection with SARS-CoV-2.  Clinical  correlation with patient history and other diagnostic information is  necessary to determine patient infection status.  Positive results do  not rule out bacterial infection or co-infection with other viruses. If result is PRESUMPTIVE POSTIVE SARS-CoV-2 nucleic acids MAY BE PRESENT.   A  presumptive positive result was obtained on the submitted specimen  and confirmed on repeat testing.  While 2019 novel coronavirus  (SARS-CoV-2) nucleic acids may be present in the submitted sample  additional confirmatory testing may be necessary for epidemiological  and / or clinical management purposes  to differentiate between  SARS-CoV-2 and other Sarbecovirus currently known to infect humans.  If clinically indicated additional testing with an alternate test  methodology [phone]([id number]) is advised. The SARS-CoV-2 RNA is generally  detectable in upper and lower respiratory sp ecimens during the acute  phase of infection. The expected result is Negative. Fact Sheet for Patients:  BoilerBrush.com.cyhttps://www.fda.gov/media/136312/download Fact Sheet for Healthcare Providers: https://pope.com/https://www.fda.gov/media/136313/download This test is not yet approved or cleared by the [place] FDA and has been authorized for detection and/or diagnosis of SARS-CoV-2 by FDA under an Emergency Use Authorization (EUA).  This EUA will remain in effect (meaning this test can be used) for the duration of the COVID-19 declaration under Section 564(b)(1) of the Act, 21 U.S.C. section 360bbb-3(b)(1), unless the authorization  is terminated or revoked sooner. Performed at [facility], [place] [address] [place],  [address]      Studies: Vas [place] Lower Extremity Venous (dvt)  Result Date: 03/27/2019  Lower Venous Study Indications: Edema.  Risk Factors: None identified. Limitations: Body habitus, poor ultrasound/tissue interface and patient pain tolerance. Comparison Study: No prior studies. Performing Technologist: Oliver Hum RVT  Examination Guidelines: A complete evaluation includes B-mode imaging, spectral Doppler, color Doppler, and power Doppler as needed of all accessible portions of each vessel. Bilateral testing is considered an integral part of a complete examination. Limited examinations for reoccurring  indications may be performed as noted.  +---------+---------------+---------+-----------+----------+--------------+ RIGHT    CompressibilityPhasicitySpontaneityPropertiesSummary        +---------+---------------+---------+-----------+----------+--------------+ CFV      Full           Yes      Yes                                 +---------+---------------+---------+-----------+----------+--------------+ SFJ      Full                                                        +---------+---------------+---------+-----------+----------+--------------+ FV Prox  Full                                                        +---------+---------------+---------+-----------+----------+--------------+ FV Mid   Full                                                        +---------+---------------+---------+-----------+----------+--------------+ FV Distal               Yes      Yes                                 +---------+---------------+---------+-----------+----------+--------------+ PFV      Full                                                        +---------+---------------+---------+-----------+----------+--------------+ POP      Full           Yes      Yes                                 +---------+---------------+---------+-----------+----------+--------------+ PTV      Full                                                        +---------+---------------+---------+-----------+----------+--------------+  PERO                                                  Not visualized +---------+---------------+---------+-----------+----------+--------------+   +---------+---------------+---------+-----------+----------+--------------+ LEFT     CompressibilityPhasicitySpontaneityPropertiesSummary        +---------+---------------+---------+-----------+----------+--------------+ CFV      Full           Yes      Yes                                  +---------+---------------+---------+-----------+----------+--------------+ SFJ      Full                                                        +---------+---------------+---------+-----------+----------+--------------+ FV Prox  Full                                                        +---------+---------------+---------+-----------+----------+--------------+ FV Mid   Full           Yes      Yes                                 +---------+---------------+---------+-----------+----------+--------------+ FV Distal                                             Not visualized +---------+---------------+---------+-----------+----------+--------------+ PFV      Full                                                        +---------+---------------+---------+-----------+----------+--------------+ POP      Full           Yes      Yes                                 +---------+---------------+---------+-----------+----------+--------------+ PTV      Full                                                        +---------+---------------+---------+-----------+----------+--------------+ PERO                                                  Not visualized +---------+---------------+---------+-----------+----------+--------------+     Summary: Right: There is no  evidence of deep vein thrombosis in the lower extremity. However, portions of this examination were limited- see technologist comments above. No cystic structure found in the popliteal fossa. Left: There is no evidence of deep vein thrombosis in the lower extremity. However, portions of this examination were limited- see technologist comments above. No cystic structure found in the popliteal fossa.  *See table(s) above for measurements and observations. Electronically signed by Gretta Beganodd Early MD on 03/27/2019 at 3:17:22 PM.    Final     Scheduled Meds: . aspirin EC  81 mg Oral Daily  . atorvastatin  80 mg Oral Daily   . clopidogrel  75 mg Oral Daily  . enoxaparin (LOVENOX) injection  70 mg Subcutaneous Daily  . gabapentin  600 mg Oral TID  . insulin aspart  0-5 Units Subcutaneous QHS  . insulin aspart  0-9 Units Subcutaneous TID WC  . insulin regular human CONCENTRATED  20 Units Subcutaneous QAC supper  . insulin regular human CONCENTRATED  25 Units Subcutaneous Q breakfast  . metoprolol succinate  25 mg Oral Daily  . mometasone-formoterol  2 puff Inhalation BID  . polyethylene glycol  17 g Oral BID  . potassium chloride SA  40 mEq Oral Daily  . senna  1 tablet Oral BID  . sodium chloride flush  3 mL Intravenous Once  . sodium chloride flush  3 mL Intravenous Q12H   Continuous Infusions: . sodium chloride 250 mL (03/29/19 0745)  . furosemide 120 mg (03/29/19 0745)    Principal Problem:   Acute on chronic diastolic CHF (congestive heart failure) (HCC) Active Problems:   DM2 (diabetes mellitus, type 2) (HCC)   Renal insufficiency   Acute on chronic respiratory failure (HCC)   Asthma   Morbid obesity (HCC)   Hypertension   Constipation   CHF (congestive heart failure) (HCC)    Time spent: 40 minutes    [facility] CenterBLACK,Lamoyne Palencia M NP [facility]  If 7PM-7AM, please contact night-coverage at [email or web address], password [facility] 03/29/2019, 9:38 AM  LOS: 2 days

## 2019-03-30 LAB — CBC
HCT: 32.9 % — ABNORMAL LOW (ref 36.0–46.0)
Hemoglobin: 10.7 g/dL — ABNORMAL LOW (ref 12.0–15.0)
MCH: 29.9 pg (ref 26.0–34.0)
MCHC: 32.5 g/dL (ref 30.0–36.0)
MCV: 91.9 fL (ref 80.0–100.0)
Platelets: 224 10*3/uL (ref 150–400)
RBC: 3.58 MIL/uL — ABNORMAL LOW (ref 3.87–5.11)
RDW: 14.5 % (ref 11.5–15.5)
WBC: 8.7 10*3/uL (ref 4.0–10.5)
nRBC: 0 % (ref 0.0–0.2)

## 2019-03-30 LAB — GLUCOSE, CAPILLARY
Glucose-Capillary: 212 mg/dL — ABNORMAL HIGH (ref 70–99)
Glucose-Capillary: 268 mg/dL — ABNORMAL HIGH (ref 70–99)
Glucose-Capillary: 362 mg/dL — ABNORMAL HIGH (ref 70–99)
Glucose-Capillary: 328 mg/dL — ABNORMAL HIGH (ref 70–99)

## 2019-03-30 LAB — BASIC METABOLIC PANEL
Anion gap: 12 (ref 5–15)
BUN: 42 mg/dL — ABNORMAL HIGH (ref 8–23)
CO2: 30 mmol/L (ref 22–32)
Calcium: 9.8 mg/dL (ref 8.9–10.3)
Chloride: 94 mmol/L — ABNORMAL LOW (ref 98–111)
Creatinine, Ser: 1.44 mg/dL — ABNORMAL HIGH (ref 0.44–1.00)
GFR calc Af Amer: 44 mL/min — ABNORMAL LOW (ref >60)
GFR calc non Af Amer: 38 mL/min — ABNORMAL LOW (ref >60)
Glucose, Bld: 241 mg/dL — ABNORMAL HIGH (ref 70–99)
Potassium: 5 mmol/L (ref 3.5–5.1)
Sodium: 136 mmol/L (ref 135–145)

## 2019-03-30 MED ORDER — INSULIN REGULAR HUMAN (CONC) 500 UNIT/ML ~~LOC~~ SOPN
25.0000 [IU] | PEN_INJECTOR | Freq: Every day | SUBCUTANEOUS | Status: DC
  Administered 2019-03-30: 25 [IU] via SUBCUTANEOUS

## 2019-03-30 MED ORDER — METOPROLOL SUCCINATE ER 50 MG PO TB24
50.0000 mg | ORAL_TABLET | Freq: Every day | ORAL | Status: DC
  Administered 2019-03-31 – 2019-04-05 (×6): 50 mg via ORAL
  Filled 2019-03-30 (×6): qty 1

## 2019-03-30 MED ORDER — METOLAZONE 2.5 MG PO TABS
2.5000 mg | ORAL_TABLET | Freq: Once | ORAL | Status: AC
  Administered 2019-03-31: 2.5 mg via ORAL
  Filled 2019-03-30: qty 1

## 2019-03-30 MED ORDER — OXYCODONE-ACETAMINOPHEN 5-325 MG PO TABS
1.0000 | ORAL_TABLET | Freq: Four times a day (QID) | ORAL | Status: DC | PRN
  Administered 2019-03-30 – 2019-04-05 (×17): 2 via ORAL
  Filled 2019-03-30 (×17): qty 2

## 2019-03-30 NOTE — Progress Notes (Signed)
[facility] PROGRESS NOTE  Felicia Rivas ZTI:[id number] DOB: [birth year] DOA: 03/26/2019 PCP: Marjory Sneddon, MD  63 year old morbidly obese 63 year old female with history of CAD/ CABG, chronic diastolic CHF, type 2 diabetes mellitus, CVA, hypertension, dyslipidemia on 2 L O2 as needed presented to the emergency room with worsening dyspnea on exertion, lower extremity edema right greater than left and 25 pound weight gain in 4 days, despite recent outpatient increase in Lasix dose to 120 mg p.o. twice daily.  Assessment/Plan:  1. Acute on chronic diastolic CHF -2D echocardiogram with preserved EF -With clinical improvement now, she is 4.6 L negative  -Appreciate cardiology input, currently on Lasix 120 mg twice daily with Bumex, mild bump in creatinine noted but still volume overloaded, weight down 9 pounds -Continue current regimen, educated about diet, salt restriction -Monitor I's/O, be met in a.m.  2. Right leg/thigh pain -Improving -Likely secondary to edema also has increased swelling in her right leg, Dopplers negative for DVT -Also has had skin graft in her right lower leg and an ankle fracture which was repaired 2/3 at [facility], failed, due to follow-up with orthopedics at [facility] soon for repeat surgery, FU with orthopedic surgery @ [facility]  3. History of CAD/CABG -Stable, continue aspirin Plavix beta-blocker and statin, last Myoview 12/201 9 was intermediate risk study  4. CVA, no residual deficits, continue aspirin Plavix and statin -Stable  5. Morbid obesity, BMI is 53 -Needs aggressive lifestyle modification  6. Type 2 diabetes mellitus -Continue U500 insulin, increase dose to 25 twice daily, last A1c is 5.8  7.  Chronic hip and ankle pain -Was taking naproxen for this which is unsafe in the setting of renal insufficiency and CHF -Now on Percocet as needed  Code Status: full Family Communication: patient Disposition Plan: Home likely 1 to 2  days   Consultants:  Dr berry cardiology  Procedures:  echo  Antibiotics:    HPI/Subjective: -Complains of her chronic hip, back pain, used to take naproxen for this Objective: Vitals:   03/30/19 0723 03/30/19 0859  BP:  (!) 133/44  Pulse:  84  Resp:    Temp:    SpO2: 98% 100%    Intake/Output Summary (Last 24 hours) at 03/30/2019 1308 Last data filed at 03/30/2019 1300 Gross per 24 hour  Intake 752.13 ml  Output 1400 ml  Net -647.87 ml   Filed Weights   03/28/19 0726 03/29/19 0555 03/30/19 0513  Weight: (!) 150.2 kg (!) 148.1 kg (!) 146.5 kg    Exam:  Gen: Obese chronically ill female, sitting up in the recliner, in good spirits, AAO x3 HEENT: PERRLA, Neck supple, no JVD Lungs: Decreased breath sounds at both bases  CVS: RRR,No Gallops,Rubs or new Murmurs Abd: Soft nontender, nondistended, bowel sounds present Extremities: 2+ edema right greater than left, right lower leg with dressing Skin: Skin graft near right ankle with dressing  Data Reviewed: Basic Metabolic Panel: Recent Labs  Lab 03/26/19 1645 03/27/19 0348 03/28/19 0520 03/29/19 0536 03/30/19 0432  NA 140 140 140 139 136  K 4.0 4.5 4.8 4.7 5.0  CL 103 100 100 98 94*  CO2 '26 28 30 31 30  ' GLUCOSE 48* 194* 190* 168* 241*  BUN 37* 36* 39* 38* 42*  CREATININE 1.40* 1.50* 1.45* 1.32* 1.44*  CALCIUM 9.3 9.2 9.5 9.7 9.8  MG  --  2.5*  --   --   --   PHOS  --  4.0  --   --   --  Liver Function Tests: Recent Labs  Lab 03/27/19 0348  AST 23  ALT 19  ALKPHOS 120  BILITOT 1.1  PROT 7.0  ALBUMIN 3.7   No results for input(s): LIPASE, AMYLASE in the last 168 hours. No results for input(s): AMMONIA in the last 168 hours. CBC: Recent Labs  Lab 03/26/19 1645 03/27/19 0348 03/30/19 0432  WBC 8.5 10.4 8.7  HGB 11.7* 11.2* 10.7*  HCT 37.2 35.6* 32.9*  MCV 93.9 93.4 91.9  PLT 240 240 224   Cardiac Enzymes: No results for input(s): CKTOTAL, CKMB, CKMBINDEX, TROPONINI in the last 168  hours. BNP (last 3 results) Recent Labs    03/26/19 1826  BNP 85.4    ProBNP (last 3 results) Recent Labs    07/17/18 1123  PROBNP 399*    CBG: Recent Labs  Lab 03/29/19 1123 03/29/19 1707 03/29/19 2116 03/30/19 0601 03/30/19 1118  GLUCAP 215* 240* 281* 212* 268*    Recent Results (from the past 240 hour(s))  SARS Coronavirus 2 (CEPHEID - Performed in [facility] hospital lab), Hosp Order     Status: None   Collection Time: 03/26/19  9:04 PM   Specimen: Nasopharyngeal Swab  Result Value Ref Range Status   SARS Coronavirus 2 NEGATIVE NEGATIVE Final    Comment: (NOTE) If result is NEGATIVE SARS-CoV-2 target nucleic acids are NOT DETECTED. The SARS-CoV-2 RNA is generally detectable in upper and lower  respiratory specimens during the acute phase of infection. The lowest  concentration of SARS-CoV-2 viral copies this assay can detect is 250  copies / mL. A negative result does not preclude SARS-CoV-2 infection  and should not be used as the sole basis for treatment or other  patient management decisions.  A negative result may occur with  improper specimen collection / handling, submission of specimen other  than nasopharyngeal swab, presence of viral mutation(s) within the  areas targeted by this assay, and inadequate number of viral copies  (<250 copies / mL). A negative result must be combined with clinical  observations, patient history, and epidemiological information. If result is POSITIVE SARS-CoV-2 target nucleic acids are DETECTED. The SARS-CoV-2 RNA is generally detectable in upper and lower  respiratory specimens dur ing the acute phase of infection.  Positive  results are indicative of active infection with SARS-CoV-2.  Clinical  correlation with patient history and other diagnostic information is  necessary to determine patient infection status.  Positive results do  not rule out bacterial infection or co-infection with other viruses. If result is  PRESUMPTIVE POSTIVE SARS-CoV-2 nucleic acids MAY BE PRESENT.   A presumptive positive result was obtained on the submitted specimen  and confirmed on repeat testing.  While 2019 novel coronavirus  (SARS-CoV-2) nucleic acids may be present in the submitted sample  additional confirmatory testing may be necessary for epidemiological  and / or clinical management purposes  to differentiate between  SARS-CoV-2 and other Sarbecovirus currently known to infect humans.  If clinically indicated additional testing with an alternate test  methodology [phone]) is advised. The SARS-CoV-2 RNA is generally  detectable in upper and lower respiratory sp ecimens during the acute  phase of infection. The expected result is Negative. Fact Sheet for Patients:  StrictlyIdeas.no Fact Sheet for Healthcare Providers: BankingDealers.co.za This test is not yet approved or cleared by the [place] FDA and has been authorized for detection and/or diagnosis of SARS-CoV-2 by FDA under an Emergency Use Authorization (EUA).  This EUA will remain in effect (meaning this test  can be used) for the duration of the COVID-19 declaration under Section 564(b)(1) of the Act, 21 U.S.C. section 360bbb-3(b)(1), unless the authorization is terminated or revoked sooner. Performed at [facility], [place] [address], [place],  [address]      Studies: No results found.  Scheduled Meds: . aspirin EC  81 mg Oral Daily  . atorvastatin  80 mg Oral Daily  . clopidogrel  75 mg Oral Daily  . enoxaparin (LOVENOX) injection  70 mg Subcutaneous Daily  . gabapentin  600 mg Oral TID  . insulin aspart  0-5 Units Subcutaneous QHS  . insulin aspart  0-9 Units Subcutaneous TID WC  . insulin regular human CONCENTRATED  25 Units Subcutaneous Q breakfast  . insulin regular human CONCENTRATED  25 Units Subcutaneous QAC supper  . [START ON 03/31/2019] metolazone  2.5 mg Oral Once  .  [START ON 03/31/2019] metoprolol succinate  50 mg Oral Daily  . mometasone-formoterol  2 puff Inhalation BID  . senna  1 tablet Oral BID  . sodium chloride flush  3 mL Intravenous Once  . sodium chloride flush  3 mL Intravenous Q12H   Continuous Infusions: . sodium chloride Stopped (03/29/19 0907)  . furosemide 120 mg (03/30/19 0901)    Principal Problem:   Acute on chronic diastolic CHF (congestive heart failure) (HCC) Active Problems:   Asthma   DM2 (diabetes mellitus, type 2) (HCC)   Morbid obesity (HCC)   Hypertension   Renal insufficiency   Acute on chronic respiratory failure (HCC)   Constipation   CHF (congestive heart failure) ([place])    Time spent: 25 minutes    Domenic Polite MD [facility]  If 7PM-7AM, please contact night-coverage at [email or web address], password [facility] 03/30/2019, 1:08 PM  LOS: 3 days

## 2019-03-30 NOTE — Progress Notes (Signed)
Progress Note  Patient Name: Felicia BeckmannSharon Dumlao Date of Encounter: 03/30/2019  Primary Cardiologist: Olga MillersBrian Crenshaw, MD   Subjective   Breathing and edema improved with increased dose of IV diuresis.  No chest pain.  Inpatient Medications    Scheduled Meds:  aspirin EC  81 mg Oral Daily   atorvastatin  80 mg Oral Daily   clopidogrel  75 mg Oral Daily   enoxaparin (LOVENOX) injection  70 mg Subcutaneous Daily   gabapentin  600 mg Oral TID   insulin aspart  0-5 Units Subcutaneous QHS   insulin aspart  0-9 Units Subcutaneous TID WC   insulin regular human CONCENTRATED  25 Units Subcutaneous Q breakfast   insulin regular human CONCENTRATED  25 Units Subcutaneous QAC supper   [START ON 03/31/2019] metolazone  2.5 mg Oral Once   [START ON 03/31/2019] metoprolol succinate  50 mg Oral Daily   mometasone-formoterol  2 puff Inhalation BID   senna  1 tablet Oral BID   sodium chloride flush  3 mL Intravenous Once   sodium chloride flush  3 mL Intravenous Q12H   Continuous Infusions:  sodium chloride Stopped (03/29/19 0907)   furosemide 120 mg (03/30/19 0901)   PRN Meds: sodium chloride, acetaminophen **OR** acetaminophen, albuterol, bisacodyl, ondansetron **OR** ondansetron (ZOFRAN) IV, oxyCODONE-acetaminophen, sodium chloride flush   Vital Signs    Vitals:   03/29/19 2039 03/30/19 0513 03/30/19 0723 03/30/19 0859  BP:  128/73  (!) 133/44  Pulse:  74  84  Resp:  18    Temp:  98.3 F (36.8 C)    TempSrc:  Oral    SpO2: 99% 100% 98% 100%  Weight:  (!) 146.5 kg    Height:        Intake/Output Summary (Last 24 hours) at 03/30/2019 1210 Last data filed at 03/30/2019 0901 Gross per 24 hour  Intake 632.13 ml  Output 1400 ml  Net -767.87 ml   Last 3 Weights 03/30/2019 03/29/2019 03/28/2019  Weight (lbs) 323 lb 326 lb 9.6 oz 331 lb 1.6 oz  Weight (kg) 146.512 kg 148.145 kg 150.186 kg      Telemetry    Sinus rhythm at controlled ventricular rate, short run of  nonsustained ventricular tachycardia- Personally Reviewed  ECG    No new tracing  Physical Exam   GEN: No acute distress.   Neck: No JVD Cardiac: RRR, no murmurs, rubs, or gallops.  Respiratory: Clear to auscultation bilaterally. GI: Soft, nontender, non-distended  MS:  3+ edema on right lower extremity and 2+ on left lower extremity neuro:  Nonfocal  Psych: Normal affect   Labs    High Sensitivity Troponin:   Recent Labs  Lab 03/26/19 1953 03/27/19 0016  TROPONINIHS 13 12      Cardiac EnzymesNo results for input(s): TROPONINI in the last 168 hours. No results for input(s): TROPIPOC in the last 168 hours.   Chemistry Recent Labs  Lab 03/27/19 0348 03/28/19 0520 03/29/19 0536 03/30/19 0432  NA 140 140 139 136  K 4.5 4.8 4.7 5.0  CL 100 100 98 94*  CO2 28 30 31 30   GLUCOSE 194* 190* 168* 241*  BUN 36* 39* 38* 42*  CREATININE 1.50* 1.45* 1.32* 1.44*  CALCIUM 9.2 9.5 9.7 9.8  PROT 7.0  --   --   --   ALBUMIN 3.7  --   --   --   AST 23  --   --   --   ALT 19  --   --   --  ALKPHOS 120  --   --   --   BILITOT 1.1  --   --   --   GFRNONAA 36* 38* 43* 38*  GFRAA 42* 44* 49* 44*  ANIONGAP 12 10 10 12      Hematology Recent Labs  Lab 03/26/19 1645 03/27/19 0348 03/30/19 0432  WBC 8.5 10.4 8.7  RBC 3.96 3.81* 3.58*  HGB 11.7* 11.2* 10.7*  HCT 37.2 35.6* 32.9*  MCV 93.9 93.4 91.9  MCH 29.5 29.4 29.9  MCHC 31.5 31.5 32.5  RDW 14.6 14.4 14.5  PLT 240 240 224    BNP Recent Labs  Lab 03/26/19 1826  BNP 85.4     DDimer  Recent Labs  Lab 03/27/19 0016  DDIMER 1.12*     Radiology    No results found.  Cardiac Studies   Echo 03/27/2019 IMPRESSIONS  1. The left ventricle has normal systolic function, with an ejection fraction of 55-60%. The cavity size was mildly dilated. There is moderately increased left ventricular wall thickness. Left ventricular diastolic Doppler parameters are consistent with pseudonormalization. Elevated 2. The right  ventricle has normal systolic function. The cavity was normal. There is no increase in right ventricular wall thickness. 3. The mitral valve is grossly normal. There is mild mitral annular calcification present. Trivial MR. 4. The aortic valve was not well visualized. 5. Left atrial size was moderately dilated.   Patient Profile     Felicia Rivas is a 63 y.o. female with a hx of CAD s/p CABG x 3, Chronic diastolic CHF, morbid obesity, DM, HTN, carotid artery disease s/p right carotid stent July 2018 / PAD followed by vascular (Dr. Donzetta Matters) and  HLD who is being seen for the evaluation of CHF.   Assessment & Plan    1.Acute on chronic diastolic heart failure Failed outpatient therapy.  Treated for cellulitis on right lower extremity. -Edema and breathing improving with IV Lasix 120mg  twice daily.  Diurese 4.5 L so far. Weigh down 8lb. Renal function improving. -Continue IV Lasix 120 mg twice daily.  Give metolazone 2.5 mg again tomorrow. -On supplemental potassium. -Continue beta-blocker.  Increased dose from 25 mg to 50 mg given mildly elevated heart rate and short run of nonsustained ventricular tachycardia.  2. CAD s/p CABG - No angina. Troponin negative.   3.  Carotid artery disease/peripheral arterial disease -Patient is due for carotid arterial Doppler and ABIs. -Follow-up with vascular  We will see again on Monday.  Still needs continued diuresis.  Clearly not yet at dry weight with 2-3+ pitting edema.  Will likely need to go home on increasing diuretic dose including every other day Zaroxolyn with close outpatient follow-up.  For questions or updates, please contact [place] Please consult [email or web address] for contact info under        Signed, Quay Burow, MD  03/30/2019, 12:10 PM

## 2019-03-30 NOTE — Plan of Care (Signed)
  Problem: Education: Goal: Knowledge of General Education information will improve Description Including pain rating scale, medication(s)/side effects and non-pharmacologic comfort measures Outcome: Progressing   

## 2019-03-31 LAB — BASIC METABOLIC PANEL
Anion gap: 12 (ref 5–15)
BUN: 49 mg/dL — ABNORMAL HIGH (ref 8–23)
CO2: 32 mmol/L (ref 22–32)
Calcium: 10 mg/dL (ref 8.9–10.3)
Chloride: 91 mmol/L — ABNORMAL LOW (ref 98–111)
Creatinine, Ser: 1.6 mg/dL — ABNORMAL HIGH (ref 0.44–1.00)
GFR calc Af Amer: 39 mL/min — ABNORMAL LOW (ref >60)
GFR calc non Af Amer: 34 mL/min — ABNORMAL LOW (ref >60)
Glucose, Bld: 307 mg/dL — ABNORMAL HIGH (ref 70–99)
Potassium: 4.4 mmol/L (ref 3.5–5.1)
Sodium: 135 mmol/L (ref 135–145)

## 2019-03-31 LAB — GLUCOSE, CAPILLARY
Glucose-Capillary: 261 mg/dL — ABNORMAL HIGH (ref 70–99)
Glucose-Capillary: 290 mg/dL — ABNORMAL HIGH (ref 70–99)
Glucose-Capillary: 297 mg/dL — ABNORMAL HIGH (ref 70–99)
Glucose-Capillary: 299 mg/dL — ABNORMAL HIGH (ref 70–99)
Glucose-Capillary: 304 mg/dL — ABNORMAL HIGH (ref 70–99)
Glucose-Capillary: 370 mg/dL — ABNORMAL HIGH (ref 70–99)
Glucose-Capillary: 409 mg/dL — ABNORMAL HIGH (ref 70–99)

## 2019-03-31 MED ORDER — FUROSEMIDE 10 MG/ML IJ SOLN
80.0000 mg | Freq: Two times a day (BID) | INTRAMUSCULAR | Status: DC
  Administered 2019-03-31 – 2019-04-03 (×6): 80 mg via INTRAVENOUS
  Filled 2019-03-31 (×7): qty 8

## 2019-03-31 MED ORDER — INSULIN REGULAR HUMAN (CONC) 500 UNIT/ML ~~LOC~~ SOPN
30.0000 [IU] | PEN_INJECTOR | Freq: Every day | SUBCUTANEOUS | Status: DC
  Administered 2019-03-31: 30 [IU] via SUBCUTANEOUS

## 2019-03-31 MED ORDER — GABAPENTIN 400 MG PO CAPS
400.0000 mg | ORAL_CAPSULE | Freq: Three times a day (TID) | ORAL | Status: DC
  Administered 2019-03-31 – 2019-04-05 (×16): 400 mg via ORAL
  Filled 2019-03-31 (×16): qty 1

## 2019-03-31 MED ORDER — INSULIN REGULAR HUMAN (CONC) 500 UNIT/ML ~~LOC~~ SOPN
30.0000 [IU] | PEN_INJECTOR | Freq: Every day | SUBCUTANEOUS | Status: DC
  Administered 2019-04-01: 30 [IU] via SUBCUTANEOUS

## 2019-03-31 MED ORDER — PANTOPRAZOLE SODIUM 40 MG PO TBEC
40.0000 mg | DELAYED_RELEASE_TABLET | Freq: Every day | ORAL | Status: DC
  Administered 2019-03-31 – 2019-04-05 (×6): 40 mg via ORAL
  Filled 2019-03-31 (×6): qty 1

## 2019-03-31 NOTE — Progress Notes (Signed)
[facility] PROGRESS NOTE  Wynelle BeckmannSharon Voller ZOX:[id number] DOB: 07/26/1956 DOA: 03/26/2019 PCP: [name], [name], MD  63 year old morbidly obese 63 year old female with history of CAD/ CABG, chronic diastolic CHF, type 2 diabetes mellitus, CVA, hypertension, dyslipidemia on 2 L O2 as needed presented to the emergency room with worsening dyspnea on exertion, lower extremity edema right greater than left and 25 pound weight gain in 4 days, despite recent outpatient increase in Lasix dose to 120 mg p.o. twice daily.  Assessment/Plan:  1. Acute on chronic diastolic CHF -2D echocardiogram with preserved EF -With clinical improvement now, 6.8 L negative, weight improving as well -Appreciate cardiology input,  -Remains on Lasix 120 mg twice daily with Bumex, given bump in creatinine will cut down Lasix to 80 mg today, already got Bumex dose at 5 AM this morning  -I suspect she is getting close to euvolemic state and may also always have some degree of lower extremity edema from venous stasis and right ankle fracture  -Bmet in a.m.  2. Right leg/thigh pain -Improving -Likely secondary to edema also has increased swelling in her right leg, Dopplers negative for DVT -Also has had skin graft in her right lower leg and an ankle fracture which was repaired 2/3 at [facility], failed, due [facility], FU with orthopedic surgery @ [facility]  3. History of CAD/CABG -Stable, continue aspirin Plavix beta-blocker and statin, last Myoview 12/201 9 was intermediate risk study  4. CVA, no residual deficits, continue aspirin Plavix and statin -Stable  5. Morbid obesity, BMI is 53 -Needs aggressive lifestyle modification  6. Type 2 diabetes mellitus -Continue U500 insulin, CBGs uncontrolled will increase U5 100 dose to 30 units -Last hemoglobin A1c was 5.8  7.  Chronic hip and ankle pain -Was taking naproxen for this which is unsafe in the setting of  renal insufficiency and CHF -Now on Percocet as needed  Code Status: full Family Communication: patient Disposition Plan: Home tomorrow if stable   Consultants:  Dr berry cardiology  Procedures:  echo  Antibiotics:    HPI/Subjective: -Feels much better, Percocet helping her low back and hip significantly  Objective: Vitals:   03/31/19 0714 03/31/19 0851  BP:  (!) 119/99  Pulse:  79  Resp:  20  Temp:  98.1 F (36.7 C)  SpO2: 98% 100%    Intake/Output Summary (Last 24 hours) at 03/31/2019 1236 Last data filed at 03/31/2019 0900 Gross per 24 hour  Intake 1080 ml  Output 3225 ml  Net -2145 ml   Filed Weights   03/29/19 0555 03/30/19 0513 03/31/19 0044  Weight: (!) 148.1 kg (!) 146.5 kg (!) 146.4 kg    Exam:  Gen: Obese chronically ill female, sitting up in recliner, in good spirits, AAO x3 HEENT: PERRLA, Neck supple, no JVD Lungs: Decreased breath sounds at both bases CVS: RRR,No Gallops,Rubs or new Murmurs Abd: Soft nontender nondistended bowel sounds present Extremities: 1+ edema right greater than left, right lower leg with dressing, over skin graft near ankle  skin: no new rashes  Data Reviewed: Basic Metabolic Panel: Recent Labs  Lab 03/27/19 0348 03/28/19 0520 03/29/19 0536 03/30/19 0432 03/31/19 0233  NA 140 140 139 136 135  K 4.5 4.8 4.7 5.0 4.4  CL 100 100 98 94* 91*  CO2 28 30 31 30  32  GLUCOSE 194* 190* 168* 241* 307*  BUN 36* 39* 38* 42* 49*  CREATININE 1.50* 1.45* 1.32* 1.44* 1.60*  CALCIUM 9.2 9.5 9.7  9.8 10.0  MG 2.5*  --   --   --   --   PHOS 4.0  --   --   --   --    Liver Function Tests: Recent Labs  Lab 03/27/19 0348  AST 23  ALT 19  ALKPHOS 120  BILITOT 1.1  PROT 7.0  ALBUMIN 3.7   No results for input(s): LIPASE, AMYLASE in the last 168 hours. No results for input(s): AMMONIA in the last 168 hours. CBC: Recent Labs  Lab 03/26/19 1645 03/27/19 0348 03/30/19 0432  WBC 8.5 10.4 8.7  HGB 11.7* 11.2* 10.7*  HCT  37.2 35.6* 32.9*  MCV 93.9 93.4 91.9  PLT 240 240 224   Cardiac Enzymes: No results for input(s): CKTOTAL, CKMB, CKMBINDEX, TROPONINI in the last 168 hours. BNP (last 3 results) Recent Labs    03/26/19 1826  BNP 85.4    ProBNP (last 3 results) Recent Labs    07/17/18 1123  PROBNP 399*    CBG: Recent Labs  Lab 03/30/19 2141 03/31/19 0043 03/31/19 0508 03/31/19 0629 03/31/19 1121  GLUCAP 328* 299* 290* 261* 297*    Recent Results (from the past 240 hour(s))  SARS Coronavirus 2 (CEPHEID - [facility]formed [facility]), Hosp Order     Status: None   Collection Time: 03/26/19  9:04 PM   Specimen: Nasopharyngeal Swab  Result Value Ref Range Status   SARS Coronavirus 2 NEGATIVE NEGATIVE Final    Comment: (NOTE) If result is NEGATIVE SARS-CoV-2 target nucleic acids are NOT DETECTED. The SARS-CoV-2 RNA is generally detectable in upper and lower  respiratory specimens during the acute phase of infection. The lowest  concentration of SARS-CoV-2 viral copies this assay can detect is 250  copies / mL. A negative result does not preclude SARS-CoV-2 infection  and should not be used as the sole basis for treatment or other  patient management decisions.  A negative result may occur with  improper specimen collection / handling, submission of specimen other  than nasopharyngeal swab, presence of viral mutation(s) within the  areas targeted by this assay, and inadequate number of viral copies  (<250 copies / mL). A negative result must be combined with clinical  observations, patient history, and epidemiological information. If result is POSITIVE SARS-CoV-2 target nucleic acids are DETECTED. The SARS-CoV-2 RNA is generally detectable in upper and lower  respiratory specimens dur ing the acute phase of infection.  Positive  results are indicative of active infection with SARS-CoV-2.  Clinical  correlation with patient history and other diagnostic information is   necessary to determine patient infection status.  Positive results do  not rule out bacterial infection or co-infection with other viruses. If result is PRESUMPTIVE POSTIVE SARS-CoV-2 nucleic acids MAY BE PRESENT.   A presumptive positive result was obtained on the submitted specimen  and confirmed on repeat testing.  While 2019 novel coronavirus  (SARS-CoV-2) nucleic acids may be present in the submitted sample  additional confirmatory testing may be necessary for epidemiological  and / or clinical management purposes  to differentiate between  SARS-CoV-2 and other Sarbecovirus currently known to infect humans.  If clinically indicated additional testing with an alternate test  methodology [phone]([id number]) is advised. The SARS-CoV-2 RNA is generally  detectable in upper and lower respiratory sp ecimens during the acute  phase of infection. The expected result is Negative. Fact Sheet for Patients:  BoilerBrush.com.cyhttps://www.fda.gov/media/136312/download Fact Sheet for Healthcare Providers: https://pope.com/https://www.fda.gov/media/136313/download This test is not yet approved or  cleared by the [place] and has been authorized for detection and/or diagnosis of SARS-CoV-2 by FDA under an Emergency Use Authorization (EUA).  This EUA will remain in effect (meaning this test can be used) for the duration of the COVID-19 declaration under Section 564(b)(1) of the Act, 21 U.S.C. section 360bbb-3(b)(1), unless the authorization is terminated or revoked sooner. Performed at [facility], [place] [address], [place], [place] [address]      Studies: No results found.  Scheduled Meds: . aspirin EC  81 mg Oral Daily  . atorvastatin  80 mg Oral Daily  . clopidogrel  75 mg Oral Daily  . enoxaparin (LOVENOX) injection  70 mg Subcutaneous Daily  . furosemide  80 mg Intravenous BID  . gabapentin  400 mg Oral TID  . insulin aspart  0-5 Units Subcutaneous QHS  . insulin aspart  0-9 Units Subcutaneous TID WC  . [START  ON 04/01/2019] insulin regular human CONCENTRATED  30 Units Subcutaneous Q breakfast  . insulin regular human CONCENTRATED  30 Units Subcutaneous QAC supper  . metoprolol succinate  50 mg Oral Daily  . mometasone-formoterol  2 puff Inhalation BID  . pantoprazole  40 mg Oral Q1200  . senna  1 tablet Oral BID  . sodium chloride flush  3 mL Intravenous Once  . sodium chloride flush  3 mL Intravenous Q12H   Continuous Infusions: . sodium chloride Stopped (03/29/19 0907)    Principal Problem:   Acute on chronic diastolic heart failure (HCC) Active Problems:   Asthma   DM2 (diabetes mellitus, type 2) ([place])   Morbid obesity (HCC)   Hypertension   Renal insufficiency   Acute on chronic respiratory failure (HCC)   Constipation   CHF (congestive heart failure) ([place])    Time spent: 25 minutes    Domenic Polite MD [facility]  If 7PM-7AM, please contact night-coverage at [email or web address], password [facility] 03/31/2019, 12:36 PM  LOS: 4 days

## 2019-03-31 NOTE — Plan of Care (Signed)
?  Problem: Education: ?Goal: Knowledge of General Education information will improve ?Description: Including pain rating scale, medication(s)/side effects and non-pharmacologic comfort measures ?Outcome: Progressing ?  ?Problem: Health Behavior/Discharge Planning: ?Goal: Ability to manage health-related needs will improve ?Outcome: Progressing ?  ?Problem: Clinical Measurements: ?Goal: Ability to maintain clinical measurements within normal limits will improve ?Outcome: Progressing ?Goal: Will remain free from infection ?Outcome: Progressing ?Goal: Diagnostic test results will improve ?Outcome: Progressing ?Goal: Cardiovascular complication will be avoided ?Outcome: Progressing ?  ?Problem: Activity: ?Goal: Risk for activity intolerance will decrease ?Outcome: Progressing ?  ?Problem: Nutrition: ?Goal: Adequate nutrition will be maintained ?Outcome: Progressing ?  ?Problem: Coping: ?Goal: Level of anxiety will decrease ?Outcome: Progressing ?  ?Problem: Elimination: ?Goal: Will not experience complications related to bowel motility ?Outcome: Progressing ?  ?Problem: Pain Managment: ?Goal: General experience of comfort will improve ?Outcome: Progressing ?  ?Problem: Safety: ?Goal: Ability to remain free from injury will improve ?Outcome: Progressing ?  ?Problem: Skin Integrity: ?Goal: Risk for impaired skin integrity will decrease ?Outcome: Progressing ?  ?

## 2019-04-01 DIAGNOSIS — I2510 Atherosclerotic heart disease of native coronary artery without angina pectoris: Secondary | ICD-10-CM

## 2019-04-01 LAB — BASIC METABOLIC PANEL
Anion gap: 12 (ref 5–15)
BUN: 55 mg/dL — ABNORMAL HIGH (ref 8–23)
CO2: 34 mmol/L — ABNORMAL HIGH (ref 22–32)
Calcium: 9.8 mg/dL (ref 8.9–10.3)
Chloride: 89 mmol/L — ABNORMAL LOW (ref 98–111)
Creatinine, Ser: 1.55 mg/dL — ABNORMAL HIGH (ref 0.44–1.00)
GFR calc Af Amer: 41 mL/min — ABNORMAL LOW (ref >60)
GFR calc non Af Amer: 35 mL/min — ABNORMAL LOW (ref >60)
Glucose, Bld: 337 mg/dL — ABNORMAL HIGH (ref 70–99)
Potassium: 4.4 mmol/L (ref 3.5–5.1)
Sodium: 135 mmol/L (ref 135–145)

## 2019-04-01 LAB — GLUCOSE, CAPILLARY
Glucose-Capillary: 327 mg/dL — ABNORMAL HIGH (ref 70–99)
Glucose-Capillary: 391 mg/dL — ABNORMAL HIGH (ref 70–99)
Glucose-Capillary: 365 mg/dL — ABNORMAL HIGH (ref 70–99)
Glucose-Capillary: 358 mg/dL — ABNORMAL HIGH (ref 70–99)

## 2019-04-01 MED ORDER — INSULIN GLARGINE 100 UNIT/ML ~~LOC~~ SOLN
20.0000 [IU] | Freq: Once | SUBCUTANEOUS | Status: AC
  Administered 2019-04-01: 20 [IU] via SUBCUTANEOUS
  Filled 2019-04-01: qty 0.2

## 2019-04-01 MED ORDER — INSULIN REGULAR HUMAN (CONC) 500 UNIT/ML ~~LOC~~ SOPN
40.0000 [IU] | PEN_INJECTOR | Freq: Every day | SUBCUTANEOUS | Status: DC
  Administered 2019-04-02: 40 [IU] via SUBCUTANEOUS

## 2019-04-01 MED ORDER — INSULIN REGULAR HUMAN (CONC) 500 UNIT/ML ~~LOC~~ SOPN
40.0000 [IU] | PEN_INJECTOR | Freq: Every day | SUBCUTANEOUS | Status: DC
  Administered 2019-04-01: 40 [IU] via SUBCUTANEOUS

## 2019-04-01 NOTE — Progress Notes (Signed)
Physical Therapy Treatment Patient Details Name: Felicia Rivas MRN: [id number] DOB: October 28, 1955 Today's Date: 04/01/2019    History of Present Illness Pt is a 63 y/o female with PMH of CAD s/p CABG, asthma, CHF, DM2, HTN, CVA, 2 L O2 as needed. Presenting with SOB, 25 lb weight gain in 4 days and B LE edema. Admitted for CHF exacerbation.      PT Comments    Pt pleasant sitting in chair, eager to return home. Pt able to increase gait distance an additional 21' today but then fatigued at room door. Pt continues to attempt to maximize mobility but lacks the ability to truly determine her tolerance. Pt with improved function and strength of RLE today with difficulty with LLE marching as only concern with HEP. Pt also continues to need mod cues for safe RW use for gait. Will continue to follow acutely.  Pt on 3L throughout with pulse ox unable to read on any finger    Follow Up Recommendations  Supervision for mobility/OOB;No PT follow up     Equipment Recommendations  None recommended by PT    Recommendations for Other Services       Precautions / Restrictions Precautions Precautions: Fall Precaution Comments: skin graft RLE with ankle fx awaiting further sx    Mobility  Bed Mobility               General bed mobility comments: in chair on arrival and end of session  Transfers Overall transfer level: Needs assistance   Transfers: Sit to/from Stand Sit to Stand: Min assist;Modified independent (Device/Increase time)         General transfer comment: pt able to stand from recliner with mod I, min assist to stand from chair without armrests  Ambulation/Gait Ambulation/Gait assistance: Supervision Gait Distance (Feet): 140 Feet Assistive device: Rolling walker (2 wheeled) Gait Pattern/deviations: Step-through pattern;Decreased stride length;Wide base of support   Gait velocity interpretation: 1.31 - 2.62 ft/sec, indicative of limited community ambulator General Gait  Details: cues for posture, proximity to RW and pt able to increased gait distance however fatigued at door to room and required chair slid to her for rest prior to final 10' of gait   Stairs             Wheelchair Mobility    Modified Rankin (Stroke Patients Only)       Balance     Sitting balance-Leahy Scale: Good     Standing balance support: Bilateral upper extremity supported Standing balance-Leahy Scale: Poor Standing balance comment: requires UE support                             Cognition Arousal/Alertness: Awake/alert Behavior During Therapy: WFL for tasks assessed/performed Overall Cognitive Status: Within Functional Limits for tasks assessed                                        Exercises General Exercises - Lower Extremity Long Arc Quad: AROM;20 reps;Both;Seated Hip Flexion/Marching: AAROM;20 reps;Both;Seated    General Comments        Pertinent Vitals/Pain Faces Pain Scale: Hurts a little bit Pain Location: back - chronic  Pain Descriptors / Indicators: Aching Pain Intervention(s): Limited activity within patient's tolerance;Repositioned;Monitored during session    Home Living  Prior Function            PT Goals (current goals can now be found in the care plan section) Progress towards PT goals: Progressing toward goals    Frequency           PT Plan Current plan remains appropriate    Co-evaluation              AM-PAC PT "6 Clicks" Mobility   Outcome Measure  Help needed turning from your back to your side while in a flat bed without using bedrails?: A Little Help needed moving from lying on your back to sitting on the side of a flat bed without using bedrails?: A Little Help needed moving to and from a bed to a chair (including a wheelchair)?: A Little Help needed standing up from a chair using your arms (e.g., wheelchair or bedside chair)?: A Little Help needed  to walk in hospital room?: A Little Help needed climbing 3-5 steps with a railing? : A Lot 6 Click Score: 17    End of Session Equipment Utilized During Treatment: Oxygen Activity Tolerance: Patient tolerated treatment well Patient left: in chair;with call bell/phone within reach;with nursing/sitter in room Nurse Communication: Mobility status PT Visit Diagnosis: Other abnormalities of gait and mobility (R26.89);Unsteadiness on feet (R26.81);Muscle weakness (generalized) (M62.81)     Time: [id number] PT Time Calculation (min) (ACUTE ONLY): 18 min  Charges:  $Gait Training: 8-22 mins                     Jameir Ake Abner Greenspanabor Cayne Yom, PT Acute Rehabilitation Services Pager: [phone] Office: [phone]    Enedina FinnerMaija B Jermaine Neuharth 04/01/2019, 1:56 PM

## 2019-04-01 NOTE — Progress Notes (Signed)
Progress Note  Patient Name: Felicia BeckmannSharon Dake Date of Encounter: 04/01/2019  Primary Cardiologist: Olga MillersBrian Crenshaw, MD   Subjective   Overall feels better. Reports less SOB but appears a bit dyspneic at times when talking. Denies CP. She reports significant subjective improvement in the appearance of her legs. Swelling is improving.    Inpatient Medications    Scheduled Meds:  aspirin EC  81 mg Oral Daily   atorvastatin  80 mg Oral Daily   clopidogrel  75 mg Oral Daily   enoxaparin (LOVENOX) injection  70 mg Subcutaneous Daily   furosemide  80 mg Intravenous BID   gabapentin  400 mg Oral TID   insulin aspart  0-5 Units Subcutaneous QHS   insulin aspart  0-9 Units Subcutaneous TID WC   [START ON 04/02/2019] insulin regular human CONCENTRATED  40 Units Subcutaneous Q breakfast   insulin regular human CONCENTRATED  40 Units Subcutaneous QAC supper   metoprolol succinate  50 mg Oral Daily   mometasone-formoterol  2 puff Inhalation BID   pantoprazole  40 mg Oral Q1200   senna  1 tablet Oral BID   sodium chloride flush  3 mL Intravenous Once   sodium chloride flush  3 mL Intravenous Q12H   Continuous Infusions:  sodium chloride Stopped (03/29/19 0907)   PRN Meds: sodium chloride, acetaminophen **OR** acetaminophen, albuterol, bisacodyl, ondansetron **OR** ondansetron (ZOFRAN) IV, oxyCODONE-acetaminophen, sodium chloride flush   Vital Signs    Vitals:   04/01/19 0504 04/01/19 0505 04/01/19 0823 04/01/19 0834  BP: (!) 140/56  (!) 149/82   Pulse: 70  82 79  Resp: 18   18  Temp: (!) 97.4 F (36.3 C)  98 F (36.7 C)   TempSrc: Oral  Oral   SpO2: 93%  97% 98%  Weight:  (!) 145.1 kg    Height:        Intake/Output Summary (Last 24 hours) at 04/01/2019 1003 Last data filed at 04/01/2019 0830 Gross per 24 hour  Intake 1080 ml  Output 3000 ml  Net -1920 ml   Last 3 Weights 04/01/2019 03/31/2019 03/30/2019  Weight (lbs) 319 lb 12.8 oz 322 lb 11.2 oz 323 lb    Weight (kg) 145.06 kg 146.376 kg 146.512 kg      Telemetry    NSR occasional PVCs no recurrent NSVT in the last 12 hrs- Personally Reviewed  ECG    Not performed today - Personally Reviewed  Physical Exam   GEN: morbidly obese WF in No acute distress.   Neck: No JVD Cardiac: RRR, no murmurs, rubs, or gallops.  Respiratory: Clear to auscultation bilaterally. GI:  obese abdomen, soft, nontender MS: obese LEEs, 3+ bilateral pitting edema up to distal knee, rt ankle wrapped for LE ulcer/wound Neuro:  Nonfocal  Psych: Normal affect   Labs    High Sensitivity Troponin:   Recent Labs  Lab 03/26/19 1953 03/27/19 0016  TROPONINIHS 13 12      Cardiac EnzymesNo results for input(s): TROPONINI in the last 168 hours. No results for input(s): TROPIPOC in the last 168 hours.   Chemistry Recent Labs  Lab 03/27/19 0348  03/30/19 0432 03/31/19 0233 04/01/19 0356  NA 140   < > 136 135 135  K 4.5   < > 5.0 4.4 4.4  CL 100   < > 94* 91* 89*  CO2 28   < > 30 32 34*  GLUCOSE 194*   < > 241* 307* 337*  BUN 36*   < >  42* 49* 55*  CREATININE 1.50*   < > 1.44* 1.60* 1.55*  CALCIUM 9.2   < > 9.8 10.0 9.8  PROT 7.0  --   --   --   --   ALBUMIN 3.7  --   --   --   --   AST 23  --   --   --   --   ALT 19  --   --   --   --   ALKPHOS 120  --   --   --   --   BILITOT 1.1  --   --   --   --   GFRNONAA 36*   < > 38* 34* 35*  GFRAA 42*   < > 44* 39* 41*  ANIONGAP 12   < > 12 12 12    < > = values in this interval not displayed.     Hematology Recent Labs  Lab 03/26/19 1645 03/27/19 0348 03/30/19 0432  WBC 8.5 10.4 8.7  RBC 3.96 3.81* 3.58*  HGB 11.7* 11.2* 10.7*  HCT 37.2 35.6* 32.9*  MCV 93.9 93.4 91.9  MCH 29.5 29.4 29.9  MCHC 31.5 31.5 32.5  RDW 14.6 14.4 14.5  PLT 240 240 224    BNP Recent Labs  Lab 03/26/19 1826  BNP 85.4     DDimer  Recent Labs  Lab 03/27/19 0016  DDIMER 1.12*     Radiology    No results found.  Cardiac Studies   Echo  03/27/2019 IMPRESSIONS  1. The left ventricle has normal systolic function, with an ejection fraction of 55-60%. The cavity size was mildly dilated. There is moderately increased left ventricular wall thickness. Left ventricular diastolic Doppler parameters are consistent with pseudonormalization. Elevated 2. The right ventricle has normal systolic function. The cavity was normal. There is no increase in right ventricular wall thickness. 3. The mitral valve is grossly normal. There is mild mitral annular calcification present. Trivial MR. 4. The aortic valve was not well visualized. 5. Left atrial size was moderately dilated.   Patient Profile     Felicia Rivas a 63 y.o.femalewith a hx of CAD s/p CABG x 3, Chronic diastolic CHF, morbid obesity, DM, HTN, carotid artery disease s/pright carotid stent July 2018/ PAD followed by vascular (Dr. Donzetta Matters) and HLD who is being seen for the evaluation ofCHF.    Assessment & Plan    1. Acute on Chronic Diastolic HF: remains markedly volume overloaded on exam. Echo shows normal LVEF, 55-60%. Being reated w/ IV diuretics + metolazone. Good urinary response in last 24 hr w/ an additional 3.6L out. Net I/Os since admit -9L. Weight is down from 334>>319 lb. K WNL. SCr trending down from 1.60 >>1.55 today (1.4 on admit). On oral  blocker, metoprolol succinate 50 mg daily. Overall feels better. Reports less SOB but appears a bit dyspneic at times when talking. Denies CP. She reports significant subjective improvement in the appearance of her legs. Swelling is improving but still  3+ bilateral LEE on exam and she needs additional diuresis. Will continue IV Lasix 80 mg BID, along with metolazone. May need to increase dose. Will defer to MD. Continue strict I/Os and daily weights. Had conversation with daughter over the phone, who has been very involved in her care. It appears her baseline weight was ~303 lb for a long time, until she had a fall in Feb  resulting in ankle fx. Shortly after her weight increased to ~314 and stayed steady  for several weeks before further increasing to the 330s most recently.   2. CAD: s/p CABG in 2012. Denies CP. Hs trop negative x 2. Continue ASA, Plavix, statin and  blocker.  3. Carotid Artery Disease/ PVD: s/p carotid stent in July 2018. Followed by Dr. Randie Heinzain. She is due to repeat surveillance carotid arterial dopplers. On ASA, Plavix and statin.   4. NSVT: EF normal. Electrolytes WNL. Home metoprolol dose increased from 25 mg to 50 mg this admit. Tele reviewed. Rare PVCs but no further runs of NSVT in the last 12 hrs.   5. DM: Hgb A1c 5.8 on admit, but CBGs have been elevated during admission up into the 300s. Management per primary team.   6. HTN: moderately elevated this AM at 149/82. This was prior to meds. Has since received IV Lasix and PO metoprolol. Continue to monitor.   7. Rt LE Wound: 2/2 mechanical fall in Feb. Slow healing. Has been managed by wound care center.     For questions or updates, please contact [facility] Please consult [email or web address] for contact info under        Signed, Robbie LisBrittainy Ave Scharnhorst, PA-C  04/01/2019, 10:03 AM

## 2019-04-01 NOTE — Progress Notes (Signed)
[facility] PROGRESS NOTE  Wynelle BeckmannSharon Vicario ZOX:[id number] DOB: 09/19/1955 DOA: 03/26/2019 PCP: [name], [name], MD  Assessment/Plan:  1. Acute on chronic diastolic CHF -2D echocardiogram with preserved EF -With clinical improvement now, 9.2 L negative, weight improving as well. Evaluated by cardiology who opine further diuresing needed given marked LE edema and not much weight loss compared to volume. ,  -Continue Lasix 80 mg twice daily with Bumex. -of note some of LE edema related to chronic venous insufficincy/stasis and right ankle fracture  -creatinine trending up slightly as well -Bmet in a.m.  2. Right leg/thigh pain -reports continued pain but less intense. -Likely secondary to edema also has increased swelling in her right leg, Dopplers negative for DVT -Also has had skin graft in her right lower leg and an ankle fracture which was repaired 2/3 at [facility], failed, [facility], FU with orthopedic surgery @ [facility]  3. History of CAD/CABG -Stable, continue aspirin Plavix beta-blocker and statin, last Myoview 12/201 9 was intermediate risk study  4. CVA, no residual deficits, continue aspirin Plavix and statin -Stable  5. Morbid obesity, BMI is 53 -Needs aggressive lifestyle modification  6. Type 2 diabetes mellitus -Continue U500 insulin, CBGs uncontrolled will increase U5 100 dose to 40 units as well as lantus 20 and SSI -Last hemoglobin A1c was 5.8  7.  Chronic hip and ankle pain -Was taking naproxen for this which is unsafe in the setting of renal insufficiency and CHF -Now on Percocet as needed  Code Status: full Family Communication: patient Disposition Plan: home hopefully tomorrow   Consultants:  Dr turner cardiology  Procedures:    Antibiotics:  echo  HPI/Subjective: Sitting in chair. Reports feeling better. Hoping to go home.   Objective: Vitals:   04/01/19 0823 04/01/19 0834   BP: (!) 149/82   Pulse: 82 79  Resp:  18  Temp: 98 F (36.7 C)   SpO2: 97% 98%    Intake/Output Summary (Last 24 hours) at 04/01/2019 1301 Last data filed at 04/01/2019 0830 Gross per 24 hour  Intake 840 ml  Output 3000 ml  Net -2160 ml   Filed Weights   03/30/19 0513 03/31/19 0044 04/01/19 0505  Weight: (!) 146.5 kg (!) 146.4 kg (!) 145.1 kg    Exam:   General:  Obese awake alert oriented x3.   Cardiovascular: rrr no mgr. LE edema 1+ on right and trace on left.   Respiratory: normal effort with conversation. BS clear bilaterally no  wheeze  Abdomen: obese soft +BS no guarding or rebounding  Musculoskeletal:  Right lower leg with dressing that is dry and intact.   Data Reviewed: Basic Metabolic Panel: Recent Labs  Lab 03/27/19 0348 03/28/19 0520 03/29/19 0536 03/30/19 0432 03/31/19 0233 04/01/19 0356  NA 140 140 139 136 135 135  K 4.5 4.8 4.7 5.0 4.4 4.4  CL 100 100 98 94* 91* 89*  CO2 28 30 31 30  32 34*  GLUCOSE 194* 190* 168* 241* 307* 337*  BUN 36* 39* 38* 42* 49* 55*  CREATININE 1.50* 1.45* 1.32* 1.44* 1.60* 1.55*  CALCIUM 9.2 9.5 9.7 9.8 10.0 9.8  MG 2.5*  --   --   --   --   --   PHOS 4.0  --   --   --   --   --    Liver Function Tests: Recent Labs  Lab 03/27/19 0348  AST 23  ALT 19  ALKPHOS 120  BILITOT 1.1  PROT 7.0  ALBUMIN 3.7   No results for input(s): LIPASE, AMYLASE in the last 168 hours. No results for input(s): AMMONIA in the last 168 hours. CBC: Recent Labs  Lab 03/26/19 1645 03/27/19 0348 03/30/19 0432  WBC 8.5 10.4 8.7  HGB 11.7* 11.2* 10.7*  HCT 37.2 35.6* 32.9*  MCV 93.9 93.4 91.9  PLT 240 240 224   Cardiac Enzymes: No results for input(s): CKTOTAL, CKMB, CKMBINDEX, TROPONINI in the last 168 hours. BNP (last 3 results) Recent Labs    03/26/19 1826  BNP 85.4    ProBNP (last 3 results) Recent Labs    07/17/18 1123  PROBNP 399*    CBG: Recent Labs  Lab 03/31/19 1614 03/31/19 2040 03/31/19 2133  04/01/19 0625 04/01/19 1106  GLUCAP 304* 409* 370* 327* 391*    Recent Results (from the past 240 hour(s))  SARS Coronavirus 2 (CEPHEID - Performed in [place] hospital lab), Hosp Order     Status: None   Collection Time: 03/26/19  9:04 PM   Specimen: Nasopharyngeal Swab  Result Value Ref Range Status   SARS Coronavirus 2 NEGATIVE NEGATIVE Final    Comment: (NOTE) If result is NEGATIVE SARS-CoV-2 target nucleic acids are NOT DETECTED. The SARS-CoV-2 RNA is generally detectable in upper and lower  respiratory specimens during the acute phase of infection. The lowest  concentration of SARS-CoV-2 viral copies this assay can detect is 250  copies / mL. A negative result does not preclude SARS-CoV-2 infection  and should not be used as the sole basis for treatment or other  patient management decisions.  A negative result may occur with  improper specimen collection / handling, submission of specimen other  than nasopharyngeal swab, presence of viral mutation(s) within the  areas targeted by this assay, and inadequate number of viral copies  (<250 copies / mL). A negative result must be combined with clinical  observations, patient history, and epidemiological information. If result is POSITIVE SARS-CoV-2 target nucleic acids are DETECTED. The SARS-CoV-2 RNA is generally detectable in upper and lower  respiratory specimens dur ing the acute phase of infection.  Positive  results are indicative of active infection with SARS-CoV-2.  Clinical  correlation with patient history and other diagnostic information is  necessary to determine patient infection status.  Positive results do  not rule out bacterial infection or co-infection with other viruses. If result is PRESUMPTIVE POSTIVE SARS-CoV-2 nucleic acids MAY BE PRESENT.   A presumptive positive result was obtained on the submitted specimen  and confirmed on repeat testing.  While 2019 novel coronavirus  (SARS-CoV-2) nucleic acids  may be present in the submitted sample  additional confirmatory testing may be necessary for epidemiological  and / or clinical management purposes  to differentiate between  SARS-CoV-2 and other Sarbecovirus currently known to infect humans.  If clinically indicated additional testing with an alternate test  methodology [phone]) is advised. The SARS-CoV-2 RNA is generally  detectable in upper and lower respiratory sp ecimens during the acute  phase of infection. The expected result is Negative. Fact Sheet for Patients:  StrictlyIdeas.no Fact Sheet for Healthcare Providers: BankingDealers.co.za This test is not yet approved or cleared by the [place] FDA and has been authorized for detection and/or diagnosis of SARS-CoV-2 by FDA under an Emergency Use Authorization (EUA).  This EUA will remain in effect (meaning this test can be used) for the duration of the COVID-19 declaration under Section 564(b)(1) of the Act, 21 U.S.C. section  360bbb-3(b)(1), unless the authorization is terminated or revoked sooner. Performed at [facility], [address] [place], [place] [address]      Studies: No results found.  Scheduled Meds: . aspirin EC  81 mg Oral Daily  . atorvastatin  80 mg Oral Daily  . clopidogrel  75 mg Oral Daily  . enoxaparin (LOVENOX) injection  70 mg Subcutaneous Daily  . furosemide  80 mg Intravenous BID  . gabapentin  400 mg Oral TID  . insulin aspart  0-5 Units Subcutaneous QHS  . insulin aspart  0-9 Units Subcutaneous TID WC  . [START ON 04/02/2019] insulin regular human CONCENTRATED  40 Units Subcutaneous Q breakfast  . insulin regular human CONCENTRATED  40 Units Subcutaneous QAC supper  . metoprolol succinate  50 mg Oral Daily  . mometasone-formoterol  2 puff Inhalation BID  . pantoprazole  40 mg Oral Q1200  . senna  1 tablet Oral BID  . sodium chloride flush  3 mL Intravenous Once  . sodium chloride  flush  3 mL Intravenous Q12H   Continuous Infusions: . sodium chloride Stopped (03/29/19 0907)    Principal Problem:   Acute on chronic diastolic heart failure (HCC) Active Problems:   DM2 (diabetes mellitus, type 2) (HCC)   Renal insufficiency   Acute on chronic respiratory failure (HCC)   Asthma   Morbid obesity (HCC)   Hypertension   Constipation   CHF (congestive heart failure) (HCC)    Time spent: 35 minutes    [facility] Northeast HospitalBLACK,Kimoni Pagliarulo M NP [facility]  If 7PM-7AM, please contact night-coverage at [email or web address], password [facility] 04/01/2019, 1:01 PM  LOS: 5 days

## 2019-04-01 NOTE — Plan of Care (Signed)
  Problem: Pain Managment: Goal: General experience of comfort will improve Outcome: Progressing   

## 2019-04-02 DIAGNOSIS — I472 Ventricular tachycardia: Secondary | ICD-10-CM

## 2019-04-02 DIAGNOSIS — I4729 Other ventricular tachycardia: Secondary | ICD-10-CM

## 2019-04-02 DIAGNOSIS — J9621 Acute and chronic respiratory failure with hypoxia: Secondary | ICD-10-CM

## 2019-04-02 DIAGNOSIS — J9622 Acute and chronic respiratory failure with hypercapnia: Secondary | ICD-10-CM

## 2019-04-02 LAB — GLUCOSE, CAPILLARY
Glucose-Capillary: 283 mg/dL — ABNORMAL HIGH (ref 70–99)
Glucose-Capillary: 322 mg/dL — ABNORMAL HIGH (ref 70–99)
Glucose-Capillary: 394 mg/dL — ABNORMAL HIGH (ref 70–99)
Glucose-Capillary: 350 mg/dL — ABNORMAL HIGH (ref 70–99)

## 2019-04-02 LAB — BASIC METABOLIC PANEL
Anion gap: 12 (ref 5–15)
BUN: 60 mg/dL — ABNORMAL HIGH (ref 8–23)
CO2: 32 mmol/L (ref 22–32)
Calcium: 9.5 mg/dL (ref 8.9–10.3)
Chloride: 89 mmol/L — ABNORMAL LOW (ref 98–111)
Creatinine, Ser: 1.64 mg/dL — ABNORMAL HIGH (ref 0.44–1.00)
GFR calc Af Amer: 38 mL/min — ABNORMAL LOW (ref >60)
GFR calc non Af Amer: 33 mL/min — ABNORMAL LOW (ref >60)
Glucose, Bld: 304 mg/dL — ABNORMAL HIGH (ref 70–99)
Potassium: 3.7 mmol/L (ref 3.5–5.1)
Sodium: 133 mmol/L — ABNORMAL LOW (ref 135–145)

## 2019-04-02 LAB — MAGNESIUM: Magnesium: 2.5 mg/dL — ABNORMAL HIGH (ref 1.7–2.4)

## 2019-04-02 MED ORDER — INSULIN GLARGINE 100 UNIT/ML ~~LOC~~ SOLN
20.0000 [IU] | Freq: Once | SUBCUTANEOUS | Status: AC
  Administered 2019-04-02: 20 [IU] via SUBCUTANEOUS
  Filled 2019-04-02: qty 0.2

## 2019-04-02 MED ORDER — INSULIN REGULAR HUMAN (CONC) 500 UNIT/ML ~~LOC~~ SOPN
50.0000 [IU] | PEN_INJECTOR | Freq: Every day | SUBCUTANEOUS | Status: DC
  Administered 2019-04-02 – 2019-04-03 (×2): 50 [IU] via SUBCUTANEOUS

## 2019-04-02 MED ORDER — INSULIN REGULAR HUMAN (CONC) 500 UNIT/ML ~~LOC~~ SOPN
50.0000 [IU] | PEN_INJECTOR | Freq: Every day | SUBCUTANEOUS | Status: DC
  Administered 2019-04-03: 50 [IU] via SUBCUTANEOUS

## 2019-04-02 NOTE — Progress Notes (Signed)
Progress Note  Patient Name: Felicia Rivas Date of Encounter: 04/02/2019  Primary Cardiologist: Kirk Ruths, MD   Subjective   Continues to improve. Breathing improved. She walked the halls with PT yesterday and did ok from a breathing standpoint, but had to rest due to leg weakness. No CP. Had 14 beat run of VT earlier but completely asymptomatic.   Inpatient Medications    Scheduled Meds: . aspirin EC  81 mg Oral Daily  . atorvastatin  80 mg Oral Daily  . clopidogrel  75 mg Oral Daily  . enoxaparin (LOVENOX) injection  70 mg Subcutaneous Daily  . furosemide  80 mg Intravenous BID  . gabapentin  400 mg Oral TID  . insulin aspart  0-5 Units Subcutaneous QHS  . insulin aspart  0-9 Units Subcutaneous TID WC  . insulin glargine  20 Units Subcutaneous Once  . [START ON 04/03/2019] insulin regular human CONCENTRATED  50 Units Subcutaneous Q breakfast  . insulin regular human CONCENTRATED  50 Units Subcutaneous QAC supper  . metoprolol succinate  50 mg Oral Daily  . mometasone-formoterol  2 puff Inhalation BID  . pantoprazole  40 mg Oral Q1200  . senna  1 tablet Oral BID  . sodium chloride flush  3 mL Intravenous Once  . sodium chloride flush  3 mL Intravenous Q12H   Continuous Infusions: . sodium chloride Stopped (03/29/19 0907)   PRN Meds: sodium chloride, acetaminophen **OR** acetaminophen, albuterol, bisacodyl, ondansetron **OR** ondansetron (ZOFRAN) IV, oxyCODONE-acetaminophen, sodium chloride flush   Vital Signs    Vitals:   04/01/19 2019 04/02/19 0347 04/02/19 0357 04/02/19 0834  BP: (!) 126/45  (!) 140/43   Pulse: 68  78 72  Resp: 20  20 20   Temp: 97.6 F (36.4 C)  (!) 97.4 F (36.3 C)   TempSrc: Oral  Oral   SpO2: 100%  93% 94%  Weight:  (!) 145.5 kg    Height:        Intake/Output Summary (Last 24 hours) at 04/02/2019 0900 Last data filed at 04/02/2019 0350 Gross per 24 hour  Intake 480 ml  Output 2000 ml  Net -1520 ml   Last 3 Weights 04/02/2019  04/01/2019 03/31/2019  Weight (lbs) 320 lb 12.8 oz 319 lb 12.8 oz 322 lb 11.2 oz  Weight (kg) 145.514 kg 145.06 kg 146.376 kg      Telemetry    NSR 90s, 14 beat run of VT, ventricular bigeminy noted earlier - Personally Reviewed  ECG    Not  - Personally Reviewed  Physical Exam   GEN: morbidly obese WM in No acute distress.   Neck: No JVD Cardiac: RRR, no murmurs, rubs, or gallops.  Respiratory: Clear to auscultation bilaterally. GI: Soft, nontender, non-distended  MS: 3+ bilateral LE pitting edema, rt ankle wrapped w/ ace bandaging (previous fx w/ slow healing wound).  Neuro:  Nonfocal  Psych: Normal affect   Labs    High Sensitivity Troponin:   Recent Labs  Lab 03/26/19 1953 03/27/19 0016  TROPONINIHS 13 12      Cardiac EnzymesNo results for input(s): TROPONINI in the last 168 hours. No results for input(s): TROPIPOC in the last 168 hours.   Chemistry Recent Labs  Lab 03/27/19 0348  03/30/19 0432 03/31/19 0233 04/01/19 0356  NA 140   < > 136 135 135  K 4.5   < > 5.0 4.4 4.4  CL 100   < > 94* 91* 89*  CO2 28   < > 30  32 34*  GLUCOSE 194*   < > 241* 307* 337*  BUN 36*   < > 42* 49* 55*  CREATININE 1.50*   < > 1.44* 1.60* 1.55*  CALCIUM 9.2   < > 9.8 10.0 9.8  PROT 7.0  --   --   --   --   ALBUMIN 3.7  --   --   --   --   AST 23  --   --   --   --   ALT 19  --   --   --   --   ALKPHOS 120  --   --   --   --   BILITOT 1.1  --   --   --   --   GFRNONAA 36*   < > 38* 34* 35*  GFRAA 42*   < > 44* 39* 41*  ANIONGAP 12   < > 12 12 12    < > = values in this interval not displayed.     Hematology Recent Labs  Lab 03/26/19 1645 03/27/19 0348 03/30/19 0432  WBC 8.5 10.4 8.7  RBC 3.96 3.81* 3.58*  HGB 11.7* 11.2* 10.7*  HCT 37.2 35.6* 32.9*  MCV 93.9 93.4 91.9  MCH 29.5 29.4 29.9  MCHC 31.5 31.5 32.5  RDW 14.6 14.4 14.5  PLT 240 240 224    BNP Recent Labs  Lab 03/26/19 1826  BNP 85.4     DDimer  Recent Labs  Lab 03/27/19 0016  DDIMER 1.12*      Radiology    No results found.  Cardiac Studies   Echo 03/27/2019 IMPRESSIONS  1. The left ventricle has normal systolic function, with an ejection fraction of 55-60%. The cavity size was mildly dilated. There is moderately increased left ventricular wall thickness. Left ventricular diastolic Doppler parameters are consistent with pseudonormalization. Elevated 2. The right ventricle has normal systolic function. The cavity was normal. There is no increase in right ventricular wall thickness. 3. The mitral valve is grossly normal. There is mild mitral annular calcification present. Trivial MR. 4. The aortic valve was not well visualized. 5. Left atrial size was moderately dilated.  Patient Profile     Felicia Rivas a 63 y.o.femalewith a hx of CAD s/p CABG x 3, Chronic diastolic CHF, morbid obesity, DM, HTN, carotid artery disease s/pright carotid stent July 2018/ PAD followed by vascular (Dr. Randie Heinzain) and HLD who is being seen for the evaluation ofCHF.   Assessment & Plan    1. Acute on Chronic Diastolic HF: responding to IV diuretics but she remains markedly volume overloaded on exam w/ 3+ bilateral LE pitting edema. Continue IV Lasix  + metolazone. 2.L out yesterday. Net I/Os since admit -10.6L. Doubt weight is accurate, documented as 1 lb weight gain since yesterday, from 319>>320 lb). K WNL. SCr trending up from 1.55>>1.64 today (1.4 on admit). On oral ? blocker, metoprolol succinate 50 mg daily. Overall feels better. Reports less SOB. Denies CP. Continue to diurese. She has a lot of fluid left to get off. Her baseline weight was ~303 lb for a long time, until she had a fall in Feb resulting in ankle fx. Shortly after her weight increased to ~314 and stayed steady for several weeks before further increasing to the 330s most recently. Continue to monitor renal function and electrolytes closely.   2. CAD: s/p CABG in 2012. Denies CP. Hs trop negative x 2. Continue ASA,  Plavix, statin and ? blocker.  3.  Carotid Artery Disease/ PVD: s/p carotid stent in July 2018. Followed by Dr. Randie Heinzain. She is due to repeat surveillance carotid arterial dopplers. On ASA, Plavix and statin.   4. NSVT: 14 beat run on tele, but pt completely asymptomatic. EF normal. K normal. Home metoprolol dose recently increased from 25 mg to 50 mg . Continue to monitor.   5. DM: Hgb A1c 5.8 on admit, but CBGs have been elevated during admission up into the 300s. Management per primary team.   6. HTN: mild-moderately elevated systolic BP this AM at 140 but diastolic pressure running a bit low at 43. Monitor.   7. Rt LE Wound: 2/2 mechanical fall in Feb. Slow healing. Has been managed by wound care center.     For questions or updates, please contact [facility] Please consult [email or web address] for contact info under        Signed, Robbie LisBrittainy Simmons, PA-C  04/02/2019, 9:00 AM

## 2019-04-02 NOTE — Progress Notes (Signed)
Inpatient Diabetes Program Recommendations  AACE/ADA: New Consensus Statement on Inpatient Glycemic Control (2015)  Target Ranges:  Prepandial:   less than 140 mg/dL      Peak postprandial:   less than 180 mg/dL (1-2 hours)      Critically ill patients:  140 - 180 mg/dL   Lab Results  Component Value Date   GLUCAP 283 (H) 04/02/2019   HGBA1C 5.8 (H) 03/27/2019    Review of Glycemic Control Results for Felicia Rivas, Felicia Rivas (MRN [id number]) as of 04/02/2019 10:23  Ref. Range 04/01/2019 16:19 04/01/2019 21:25 04/02/2019 06:25  Glucose-Capillary Latest Ref Range: 70 - 99 mg/dL 365 (H) 358 (H) 283 (H)   Diabetes history:Type 2 DM Outpatient Diabetes medications:U-500 20 units QAM, 10-15 units QPM Current orders for Inpatient glycemic control:Novolog 0-9 units TID, Novolog 0-5 units QHS, U-50050 units QAM, 50 units QPM, Lantus 20 units X 2 doses QAM  Inpatient Diabetes Program Recommendations:  Noted continual increases to U-500 with daily adjustments and addition of Lantus. Unclear as to why level of insulin resistance has increased, especially because of voiced concerns for lows at home (on significantly less insulin), improvement to fluid status, increased activity and no apparent changes to labs. Possibly could be attributed to diet, as patient reports daughter is very restrictive of carbohydrates at home compared to inpatient. Thus, creating difficulty for discharge recommendations in the event patient goes home today. Would strongly suggest, as previously discussed with patient to follow up with endocrinology and continue with home dosages.  Noted changes this AM. Could also consider increasing correction Novolog 0-20 units TID.   Thanks, Bronson Curb, MSN, RNC-OB Diabetes Coordinator [phone] (8a-5p)

## 2019-04-02 NOTE — Progress Notes (Signed)
Patient c/o food coming back few minutes  after she swallow, denies difficulty swallowing. MD notified via text message. Will continue to monitor the patient.

## 2019-04-02 NOTE — Progress Notes (Signed)
Occupational Therapy Treatment Patient Details Name: Felicia BeckmannSharon Varano MRN: [id number] DOB: 10/17/1955 Today's Date: 04/02/2019    History of present illness Pt is a 63 y/o female with PMH of CAD s/p CABG, asthma, CHF, DM2, HTN, CVA, 2 L O2 as needed. Presenting with SOB, 25 lb weight gain in 4 days and B LE edema. Admitted for CHF exacerbation.     OT comments  Patient progressing well.  Using RW for mobility in room, navigating obstacles and managing O2 line, with supervision given min cueing for safety.  She was able to complete 3 grooming tasks standing at sink with supervision, tolerance improving.  Overall standing and mobility activity over 8 minutes before seated rest break.  Discussed home safety and energy conservation techniques. Pt near baseline.  Will follow acutely.    Follow Up Recommendations  Home health OT;Supervision/Assistance - 24 hour    Equipment Recommendations  3 in 1 bedside commode    Recommendations for Other Services      Precautions / Restrictions Precautions Precautions: Fall Precaution Comments: skin graft RLE with ankle fx awaiting further sx Restrictions Weight Bearing Restrictions: No       Mobility Bed Mobility               General bed mobility comments: OOB upon arrival   Transfers Overall transfer level: Needs assistance Equipment used: Rolling walker (2 wheeled);1 person hand held assist Transfers: Sit to/from Stand Sit to Stand: Supervision         General transfer comment: supervision for safety     Balance Overall balance assessment: Needs assistance Sitting-balance support: Feet supported Sitting balance-Leahy Scale: Good     Standing balance support: Bilateral upper extremity supported;No upper extremity supported;During functional activity Standing balance-Leahy Scale: Poor Standing balance comment: requires UE support, able to groom at sink but noted foward lean towards sink                             ADL either performed or assessed with clinical judgement   ADL Overall ADL's : Needs assistance/impaired     Grooming: Wash/dry hands;Wash/dry face;Brushing hair;Standing;Supervision/safety               Lower Body Dressing: Moderate assistance;Sit to/from stand Lower Body Dressing Details (indicate cue type and reason): simulated, sit to stand supervision; assist for socks (baseline), but able to simulate threading pants with supervision  Toilet Transfer: RW;Ambulation;Supervision/safety;BSC Toilet Transfer Details (indicate cue type and reason): for safety, due to in room mobility around obstacles         Functional mobility during ADLs: Supervision/safety;Rolling walker       Vision       Perception     Praxis      Cognition Arousal/Alertness: Awake/alert Behavior During Therapy: WFL for tasks assessed/performed Overall Cognitive Status: Within Functional Limits for tasks assessed                                          Exercises Other Exercises Other Exercises: 10 reps chair pushups, min cueing for technique    Shoulder Instructions       General Comments VSS    Pertinent Vitals/ Pain       Pain Assessment: No/denies pain  Home Living  Prior Functioning/Environment              Frequency  Min 2X/week        Progress Toward Goals  OT Goals(current goals can now be found in the care plan section)  Progress towards OT goals: Progressing toward goals  Acute Rehab OT Goals Patient Stated Goal: to get back to normal  OT Goal Formulation: With patient  Plan Discharge plan remains appropriate;Frequency remains appropriate    Co-evaluation                 AM-PAC OT "6 Clicks" Daily Activity     Outcome Measure   Help from another person eating meals?: None Help from another person taking care of personal grooming?: None Help from another person  toileting, which includes using toliet, bedpan, or urinal?: None Help from another person bathing (including washing, rinsing, drying)?: A Little Help from another person to put on and taking off regular upper body clothing?: None Help from another person to put on and taking off regular lower body clothing?: A Little 6 Click Score: 22    End of Session Equipment Utilized During Treatment: Rolling walker;Oxygen  OT Visit Diagnosis: Muscle weakness (generalized) (M62.81)   Activity Tolerance Patient tolerated treatment well   Patient Left in chair;with call bell/phone within reach   Nurse Communication Mobility status        Time: 1610-9604 OT Time Calculation (min): 19 min  Charges: OT General Charges $OT Visit: 1 Visit OT Treatments $Self Care/Home Management : 8-22 mins  Delight Stare, Lasana Pager [phone] Office [phone]    Delight Stare 04/02/2019, 5:13 PM

## 2019-04-02 NOTE — Progress Notes (Signed)
Physical Therapy Treatment Patient Details Name: Felicia BeckmannSharon Rivas MRN: [id number] DOB: 08/16/1956 Today's Date: 04/02/2019    History of Present Illness Pt is a 63 y/o female with PMH of CAD s/p CABG, asthma, CHF, DM2, HTN, CVA, 2 L O2 as needed. Presenting with SOB, 25 lb weight gain in 4 days and B LE edema. Admitted for CHF exacerbation.      PT Comments    Pt with improved tolerance for gait and did not require seated rest this session. Oxygen sats at 98% on 3L following gait training this session. Reviewed seated HEP with pt. Feel pt would benefit from HHPT, so updated recommendations. Will continue to follow acutely to maximize functional mobility independence and safety.    Follow Up Recommendations  Home health PT;Supervision for mobility/OOB     Equipment Recommendations  None recommended by PT    Recommendations for Other Services       Precautions / Restrictions Precautions Precautions: Fall Precaution Comments: skin graft RLE with ankle fx awaiting further sx Restrictions Weight Bearing Restrictions: No    Mobility  Bed Mobility               General bed mobility comments: OOB upon arrival   Transfers Overall transfer level: Needs assistance Equipment used: Rolling walker (2 wheeled);1 person hand held assist Transfers: Sit to/from Stand Sit to Stand: Supervision         General transfer comment: Supervision for safety. Increased time required. Cues to turn all the way before attempting to sit.   Ambulation/Gait Ambulation/Gait assistance: Supervision;Min guard Gait Distance (Feet): 140 Feet Assistive device: Rolling walker (2 wheeled) Gait Pattern/deviations: Step-through pattern;Decreased stride length;Wide base of support;Trunk flexed Gait velocity: Decreased    General Gait Details: Pt with improved tolerance for gait and did not require seated rest this session during gait. Cues for proximity to device and upright posture.    Stairs             Wheelchair Mobility    Modified Rankin (Stroke Patients Only)       Balance Overall balance assessment: Needs assistance Sitting-balance support: Feet supported Sitting balance-Leahy Scale: Good     Standing balance support: Bilateral upper extremity supported Standing balance-Leahy Scale: Poor Standing balance comment: requires UE support                             Cognition Arousal/Alertness: Awake/alert Behavior During Therapy: WFL for tasks assessed/performed Overall Cognitive Status: Within Functional Limits for tasks assessed                                        Exercises General Exercises - Lower Extremity Long Arc Quad: AROM;Both;10 reps;Seated Hip Flexion/Marching: AROM;Both;10 reps;Seated Toe Raises: AROM;Left;10 reps;Seated Heel Raises: AROM;Left;10 reps;Seated    General Comments        Pertinent Vitals/Pain Pain Assessment: Faces Faces Pain Scale: Hurts a little bit Pain Location: BLE  Pain Descriptors / Indicators: Aching Pain Intervention(s): Limited activity within patient's tolerance;Monitored during session;Repositioned    Home Living                      Prior Function            PT Goals (current goals can now be found in the care plan section) Acute Rehab PT Goals Patient Stated Goal:  to get back to normal  PT Goal Formulation: With patient Time For Goal Achievement: 04/10/19 Potential to Achieve Goals: Good Progress towards PT goals: Progressing toward goals    Frequency    Min 3X/week      PT Plan Discharge plan needs to be updated    Co-evaluation              AM-PAC PT "6 Clicks" Mobility   Outcome Measure  Help needed turning from your back to your side while in a flat bed without using bedrails?: A Little Help needed moving from lying on your back to sitting on the side of a flat bed without using bedrails?: A Little Help needed moving to and from a bed to a chair  (including a wheelchair)?: A Little Help needed standing up from a chair using your arms (e.g., wheelchair or bedside chair)?: A Little Help needed to walk in hospital room?: A Little Help needed climbing 3-5 steps with a railing? : A Lot 6 Click Score: 17    End of Session Equipment Utilized During Treatment: Oxygen Activity Tolerance: Patient tolerated treatment well Patient left: in chair;with call bell/phone within reach Nurse Communication: Mobility status PT Visit Diagnosis: Other abnormalities of gait and mobility (R26.89);Unsteadiness on feet (R26.81);Muscle weakness (generalized) (M62.81)     Time: 0350-0938 PT Time Calculation (min) (ACUTE ONLY): 18 min  Charges:  $Gait Training: 8-22 mins                     Leighton Ruff, PT, DPT  Acute Rehabilitation Services  Pager: [phone] Office: [phone]    Rudean Hitt 04/02/2019, 11:39 AM

## 2019-04-02 NOTE — Progress Notes (Signed)
[facility] PROGRESS NOTE  Felicia Rivas EXH:[id number] DOB: March 07, 1956 DOA: 03/26/2019 PCP: Marjory Sneddon, MD  Assessment/Plan:  1. Acute on chronic diastolic CHF -2D echocardiogram with preserved EF -10.6 L negative since admit. 2L out yesterday. Weights do no reflect volume.. Evaluated by cardiology who opine further diuresing needed. -Continue Lasix 80 mg twice daily with Bumex. -of note some of LE edema related to chronic venous insufficincy/stasis and right ankle fracture -creatinine continues to  trend up as well -Bmet in a.m.  2. Right leg/thigh pain -report continued pain but less intense. Ambulated in hal with PT yesterday and states "legs got tired". Thigh edema much improved. LE edema mostly bilateral lower legs. Dopplers negative for DVT -Also has had skin graft in her right lower leg and an ankle fracture which was repaired 2/3 at [facility], failed, due to follow-up with orthopedics at [facility] soon for repeat surgery, FU with orthopedic surgery @ [facility]  3. History of CAD/CABG -Stable, continue aspirin Plavix beta-blocker and statin, last Myoview 12/201 9 was intermediate risk study  4. CVA, no residual deficits, continue aspirin Plavix and statin -Stable  5. Morbid obesity, BMI is 53 -Needs aggressive lifestyle modification  6. Type 2 diabetes mellitus. Poor control.  -Continue U500 insulin,CBGs uncontrolled will increase U5 100 dose to 50 units as well as lantus 20 x1 this am. Continue  SSI -Last hemoglobin A1c was 5.8 -diabetes coordinator consult requested  7. Chronic hip and ankle pain -Was taking naproxen for this which is unsafe in the setting of renal insufficiency and CHF -Now on Percocet as needed    Code Status: full Family Communication: patient  Disposition Plan: home when ready   Consultants:  cardiology  Procedures:  echo  Antibiotics:    HPI/Subjective: Sitting in chair waiting for breakfast. Reports feeling and  breathing "much better".   Objective: Vitals:   04/02/19 0834 04/02/19 0913  BP:  (!) 127/99  Pulse: 72 76  Resp: 20   Temp:  (!) 97.5 F (36.4 C)  SpO2: 94% 100%    Intake/Output Summary (Last 24 hours) at 04/02/2019 1024 Last data filed at 04/02/2019 0350 Gross per 24 hour  Intake 480 ml  Output 2000 ml  Net -1520 ml   Filed Weights   03/31/19 0044 04/01/19 0505 04/02/19 0347  Weight: (!) 146.4 kg (!) 145.1 kg (!) 145.5 kg    Exam:   General:  Obese alert no acute distress  Cardiovascular: rrr no mgr 2+ bilateral LE edema up to knees  Respiratory: normal effort BS distant but clear no crackles  Abdomen: obese soft +BS non-tender   Musculoskeletal: joints without swelling/erythema. Dressing right ankle dry and intact   Data Reviewed: Basic Metabolic Panel: Recent Labs  Lab 03/27/19 0348  03/29/19 0536 03/30/19 0432 03/31/19 0233 04/01/19 0356 04/02/19 0748  NA 140   < > 139 136 135 135 133*  K 4.5   < > 4.7 5.0 4.4 4.4 3.7  CL 100   < > 98 94* 91* 89* 89*  CO2 28   < > 31 30 32 34* 32  GLUCOSE 194*   < > 168* 241* 307* 337* 304*  BUN 36*   < > 38* 42* 49* 55* 60*  CREATININE 1.50*   < > 1.32* 1.44* 1.60* 1.55* 1.64*  CALCIUM 9.2   < > 9.7 9.8 10.0 9.8 9.5  MG 2.5*  --   --   --   --   --   --  PHOS 4.0  --   --   --   --   --   --    < > = values in this interval not displayed.   Liver Function Tests: Recent Labs  Lab 03/27/19 0348  AST 23  ALT 19  ALKPHOS 120  BILITOT 1.1  PROT 7.0  ALBUMIN 3.7   No results for input(s): LIPASE, AMYLASE in the last 168 hours. No results for input(s): AMMONIA in the last 168 hours. CBC: Recent Labs  Lab 03/26/19 1645 03/27/19 0348 03/30/19 0432  WBC 8.5 10.4 8.7  HGB 11.7* 11.2* 10.7*  HCT 37.2 35.6* 32.9*  MCV 93.9 93.4 91.9  PLT 240 240 224   Cardiac Enzymes: No results for input(s): CKTOTAL, CKMB, CKMBINDEX, TROPONINI in the last 168 hours. BNP (last 3 results) Recent Labs    03/26/19 1826   BNP 85.4    ProBNP (last 3 results) Recent Labs    07/17/18 1123  PROBNP 399*    CBG: Recent Labs  Lab 04/01/19 0625 04/01/19 1106 04/01/19 1619 04/01/19 2125 04/02/19 0625  GLUCAP 327* 391* 365* 358* 283*    Recent Results (from the past 240 hour(s))  SARS Coronavirus 2 (CEPHEID - Performed in [facility] lab), Hosp Order     Status: None   Collection Time: 03/26/19  9:04 PM   Specimen: Nasopharyngeal Swab  Result Value Ref Range Status   SARS Coronavirus 2 NEGATIVE NEGATIVE Final    Comment: (NOTE) If result is NEGATIVE SARS-CoV-2 target nucleic acids are NOT DETECTED. The SARS-CoV-2 RNA is generally detectable in upper and lower  respiratory specimens during the acute phase of infection. The lowest  concentration of SARS-CoV-2 viral copies this assay can detect is 250  copies / mL. A negative result does not preclude SARS-CoV-2 infection  and should not be used as the sole basis for treatment or other  patient management decisions.  A negative result may occur with  improper specimen collection / handling, submission of specimen other  than nasopharyngeal swab, presence of viral mutation(s) within the  areas targeted by this assay, and inadequate number of viral copies  (<250 copies / mL). A negative result must be combined with clinical  observations, patient history, and epidemiological information. If result is POSITIVE SARS-CoV-2 target nucleic acids are DETECTED. The SARS-CoV-2 RNA is generally detectable in upper and lower  respiratory specimens dur ing the acute phase of infection.  Positive  results are indicative of active infection with SARS-CoV-2.  Clinical  correlation with patient history and other diagnostic information is  necessary to determine patient infection status.  Positive results do  not rule out bacterial infection or co-infection with other viruses. If result is PRESUMPTIVE POSTIVE SARS-CoV-2 nucleic acids MAY BE PRESENT.   A  presumptive positive result was obtained on the submitted specimen  and confirmed on repeat testing.  While 2019 novel coronavirus  (SARS-CoV-2) nucleic acids may be present in the submitted sample  additional confirmatory testing may be necessary for epidemiological  and / or clinical management purposes  to differentiate between  SARS-CoV-2 and other Sarbecovirus currently known to infect humans.  If clinically indicated additional testing with an alternate test  methodology [phone]([id number]) is advised. The SARS-CoV-2 RNA is generally  detectable in upper and lower respiratory sp ecimens during the acute  phase of infection. The expected result is Negative. Fact Sheet for Patients:  BoilerBrush.com.cyhttps://www.fda.gov/media/136312/download Fact Sheet for Healthcare Providers: https://pope.com/https://www.fda.gov/media/136313/download This test is not yet approved or cleared  by the [place] FDA and has been authorized for detection and/or diagnosis of SARS-CoV-2 by FDA under an Emergency Use Authorization (EUA).  This EUA will remain in effect (meaning this test can be used) for the duration of the COVID-19 declaration under Section 564(b)(1) of the Act, 21 U.S.C. section 360bbb-3(b)(1), unless the authorization is terminated or revoked sooner. Performed at [facility], [address] [place], [place] [address]      Studies: No results found.  Scheduled Meds: . aspirin EC  81 mg Oral Daily  . atorvastatin  80 mg Oral Daily  . clopidogrel  75 mg Oral Daily  . enoxaparin (LOVENOX) injection  70 mg Subcutaneous Daily  . furosemide  80 mg Intravenous BID  . gabapentin  400 mg Oral TID  . insulin aspart  0-5 Units Subcutaneous QHS  . insulin aspart  0-9 Units Subcutaneous TID WC  . [START ON 04/03/2019] insulin regular human CONCENTRATED  50 Units Subcutaneous Q breakfast  . insulin regular human CONCENTRATED  50 Units Subcutaneous QAC supper  . metoprolol succinate  50 mg Oral Daily  .  mometasone-formoterol  2 puff Inhalation BID  . pantoprazole  40 mg Oral Q1200  . senna  1 tablet Oral BID  . sodium chloride flush  3 mL Intravenous Once  . sodium chloride flush  3 mL Intravenous Q12H   Continuous Infusions: . sodium chloride Stopped (03/29/19 0907)    Principal Problem:   Acute on chronic diastolic heart failure (HCC) Active Problems:   DM2 (diabetes mellitus, type 2) (HCC)   Renal insufficiency   Acute on chronic respiratory failure (HCC)   Asthma   Morbid obesity (HCC)   Hypertension   Constipation   CHF (congestive heart failure) (HCC)    Time spent: 45 minutes    [facility]add[facility] Medical CenterBLACK,KAREN M NP  [facility]  If 7PM-7AM, please contact night-coverage at [email or web address], password [email or web address] 04/02/2019, 10:24 AM  LOS: 6 days

## 2019-04-02 NOTE — Plan of Care (Signed)
?  Problem: Education: ?Goal: Knowledge of General Education information will improve ?Description: Including pain rating scale, medication(s)/side effects and non-pharmacologic comfort measures ?Outcome: Progressing ?  ?Problem: Health Behavior/Discharge Planning: ?Goal: Ability to manage health-related needs will improve ?Outcome: Progressing ?  ?Problem: Clinical Measurements: ?Goal: Ability to maintain clinical measurements within normal limits will improve ?Outcome: Progressing ?Goal: Will remain free from infection ?Outcome: Progressing ?Goal: Diagnostic test results will improve ?Outcome: Progressing ?Goal: Cardiovascular complication will be avoided ?Outcome: Progressing ?  ?Problem: Activity: ?Goal: Risk for activity intolerance will decrease ?Outcome: Progressing ?  ?Problem: Nutrition: ?Goal: Adequate nutrition will be maintained ?Outcome: Progressing ?  ?Problem: Coping: ?Goal: Level of anxiety will decrease ?Outcome: Progressing ?  ?Problem: Elimination: ?Goal: Will not experience complications related to bowel motility ?Outcome: Progressing ?  ?Problem: Pain Managment: ?Goal: General experience of comfort will improve ?Outcome: Progressing ?  ?Problem: Safety: ?Goal: Ability to remain free from injury will improve ?Outcome: Progressing ?  ?Problem: Skin Integrity: ?Goal: Risk for impaired skin integrity will decrease ?Outcome: Progressing ?  ?

## 2019-04-03 ENCOUNTER — Encounter (HOSPITAL_COMMUNITY): Payer: BC Managed Care – PPO

## 2019-04-03 DIAGNOSIS — I2510 Atherosclerotic heart disease of native coronary artery without angina pectoris: Secondary | ICD-10-CM

## 2019-04-03 LAB — GLUCOSE, CAPILLARY
Glucose-Capillary: 302 mg/dL — ABNORMAL HIGH (ref 70–99)
Glucose-Capillary: 334 mg/dL — ABNORMAL HIGH (ref 70–99)
Glucose-Capillary: 253 mg/dL — ABNORMAL HIGH (ref 70–99)
Glucose-Capillary: 96 mg/dL (ref 70–99)
Glucose-Capillary: 74 mg/dL (ref 70–99)

## 2019-04-03 LAB — BASIC METABOLIC PANEL
Anion gap: 14 (ref 5–15)
BUN: 70 mg/dL — ABNORMAL HIGH (ref 8–23)
CO2: 32 mmol/L (ref 22–32)
Calcium: 9.6 mg/dL (ref 8.9–10.3)
Chloride: 87 mmol/L — ABNORMAL LOW (ref 98–111)
Creatinine, Ser: 1.73 mg/dL — ABNORMAL HIGH (ref 0.44–1.00)
GFR calc Af Amer: 36 mL/min — ABNORMAL LOW (ref >60)
GFR calc non Af Amer: 31 mL/min — ABNORMAL LOW (ref >60)
Glucose, Bld: 319 mg/dL — ABNORMAL HIGH (ref 70–99)
Potassium: 3.7 mmol/L (ref 3.5–5.1)
Sodium: 133 mmol/L — ABNORMAL LOW (ref 135–145)

## 2019-04-03 MED ORDER — INSULIN REGULAR HUMAN (CONC) 500 UNIT/ML ~~LOC~~ SOPN
50.0000 [IU] | PEN_INJECTOR | Freq: Two times a day (BID) | SUBCUTANEOUS | Status: DC
  Administered 2019-04-03 – 2019-04-05 (×4): 50 [IU] via SUBCUTANEOUS

## 2019-04-03 MED ORDER — INSULIN ASPART 100 UNIT/ML ~~LOC~~ SOLN
0.0000 [IU] | Freq: Three times a day (TID) | SUBCUTANEOUS | Status: DC
  Administered 2019-04-03: 5 [IU] via SUBCUTANEOUS
  Administered 2019-04-04 – 2019-04-05 (×3): 7 [IU] via SUBCUTANEOUS

## 2019-04-03 MED ORDER — INSULIN ASPART 100 UNIT/ML ~~LOC~~ SOLN: 0.0000 [IU] | Freq: Every day | SUBCUTANEOUS | Status: DC

## 2019-04-03 MED ORDER — ALUM & MAG HYDROXIDE-SIMETH 200-200-20 MG/5ML PO SUSP
30.0000 mL | Freq: Once | ORAL | Status: AC
  Administered 2019-04-03: 30 mL via ORAL
  Filled 2019-04-03: qty 30

## 2019-04-03 MED ORDER — TORSEMIDE 20 MG PO TABS
40.0000 mg | ORAL_TABLET | Freq: Two times a day (BID) | ORAL | Status: DC
  Administered 2019-04-03 – 2019-04-04 (×3): 40 mg via ORAL
  Filled 2019-04-03 (×3): qty 2

## 2019-04-03 NOTE — Progress Notes (Signed)
Progress Note  Patient Name: Felicia BeckmannSharon Rivas Date of Encounter: 04/03/2019  Primary Cardiologist: Olga MillersBrian Crenshaw, MD   Subjective   Feeling much better.  No further VT on tele.  Denies any CP or SOB.  Inpatient Medications    Scheduled Meds: . aspirin EC  81 mg Oral Daily  . atorvastatin  80 mg Oral Daily  . clopidogrel  75 mg Oral Daily  . enoxaparin (LOVENOX) injection  70 mg Subcutaneous Daily  . furosemide  80 mg Intravenous BID  . gabapentin  400 mg Oral TID  . insulin aspart  0-5 Units Subcutaneous QHS  . insulin aspart  0-9 Units Subcutaneous TID WC  . insulin regular human CONCENTRATED  50 Units Subcutaneous Q breakfast  . insulin regular human CONCENTRATED  50 Units Subcutaneous QAC supper  . metoprolol succinate  50 mg Oral Daily  . mometasone-formoterol  2 puff Inhalation BID  . pantoprazole  40 mg Oral Q1200  . senna  1 tablet Oral BID  . sodium chloride flush  3 mL Intravenous Once  . sodium chloride flush  3 mL Intravenous Q12H   Continuous Infusions: . sodium chloride Stopped (03/29/19 0907)   PRN Meds: sodium chloride, acetaminophen **OR** acetaminophen, albuterol, bisacodyl, ondansetron **OR** ondansetron (ZOFRAN) IV, oxyCODONE-acetaminophen, sodium chloride flush   Vital Signs    Vitals:   04/02/19 2001 04/02/19 2115 04/03/19 0437 04/03/19 0754  BP: (!) 95/43  135/71 (!) 109/55  Pulse: 64  60 69  Resp: 18  18 20   Temp: 98.7 F (37.1 C)  97.8 F (36.6 C) 98 F (36.7 C)  TempSrc: Oral  Oral Oral  SpO2: 100% 100% 98% 100%  Weight:   (!) 144.9 kg   Height:        Intake/Output Summary (Last 24 hours) at 04/03/2019 [id number] Last data filed at 04/03/2019 0439 Gross per 24 hour  Intake 240 ml  Output 2100 ml  Net -1860 ml   Last 3 Weights 04/03/2019 04/02/2019 04/01/2019  Weight (lbs) 319 lb 8 oz 320 lb 12.8 oz 319 lb 12.8 oz  Weight (kg) 144.924 kg 145.514 kg 145.06 kg      Telemetry    Normal Sinus Rhythm - Personally Reviewed  ECG    No  new EKG to review  - Personally Reviewed  Physical Exam   GEN: Well nourished, well developed in no acute distress HEENT: Normal NECK: No JVD; No carotid bruits LYMPHATICS: No lymphadenopathy CARDIAC:RRR, no murmurs, rubs, gallops RESPIRATORY:  Clear to auscultation without rales, wheezing or rhonchi  ABDOMEN: Soft, non-tender, non-distended MUSCULOSKELETAL:  2+ LE  edema; No deformity  SKIN: Warm and dry NEUROLOGIC:  Alert and oriented x 3 PSYCHIATRIC:  Normal affect    Labs    High Sensitivity Troponin:   Recent Labs  Lab 03/26/19 1953 03/27/19 0016  TROPONINIHS 13 12      Cardiac EnzymesNo results for input(s): TROPONINI in the last 168 hours. No results for input(s): TROPIPOC in the last 168 hours.   Chemistry Recent Labs  Lab 04/01/19 0356 04/02/19 0748 04/03/19 0353  NA 135 133* 133*  K 4.4 3.7 3.7  CL 89* 89* 87*  CO2 34* 32 32  GLUCOSE 337* 304* 319*  BUN 55* 60* 70*  CREATININE 1.55* 1.64* 1.73*  CALCIUM 9.8 9.5 9.6  GFRNONAA 35* 33* 31*  GFRAA 41* 38* 36*  ANIONGAP 12 12 14      Hematology Recent Labs  Lab 03/30/19 0432  WBC 8.7  RBC 3.58*  HGB 10.7*  HCT 32.9*  MCV 91.9  MCH 29.9  MCHC 32.5  RDW 14.5  PLT 224    BNP No results for input(s): BNP, PROBNP in the last 168 hours.   DDimer  No results for input(s): DDIMER in the last 168 hours.   Radiology    No results found.  Cardiac Studies   Echo 03/27/2019 IMPRESSIONS  1. The left ventricle has normal systolic function, with an ejection fraction of 55-60%. The cavity size was mildly dilated. There is moderately increased left ventricular wall thickness. Left ventricular diastolic Doppler parameters are consistent with pseudonormalization. Elevated 2. The right ventricle has normal systolic function. The cavity was normal. There is no increase in right ventricular wall thickness. 3. The mitral valve is grossly normal. There is mild mitral annular calcification present.  Trivial MR. 4. The aortic valve was not well visualized. 5. Left atrial size was moderately dilated.  Patient Profile     Felicia Rivas a 63 y.o.femalewith a hx of CAD s/p CABG x 3, Chronic diastolic CHF, morbid obesity, DM, HTN, carotid artery disease s/pright carotid stent July 2018/ PAD followed by vascular (Dr. Donzetta Matters) and HLD who is being seen for the evaluation ofCHF.   Assessment & Plan    1. Acute on Chronic Diastolic HF:  -she has been diuresing but continues to have severe LE edema -likely some of her LE edema is related to chronic venous insuff. -she put out 2.1L yesterday and is net neg 12.5L -weight down 1lb from yesterday and 15lbs -creatinine has bumped today 1.64>>1.73 and BUN 60>>70. -her weight since March has been around 314lbs and she is near this at 319lbs today.  -she was on high dose Lasix 120mg  TID on admission - ? whether she is absorbing Lasix -will change Lasix to Demadex 40mg  BID with PRN metolazone -follow I&Os, daily weights and renal function -continue 2gm Na diet and fluid restriction to 1500cc  2. CAD: - s/p CABG in 2012. -no anginal sx  -Hs trop negative x 2.  -Continue ASA, Plavix, statin and ? blocker.  3. Carotid Artery Disease/ PVD:  -s/p carotid stent in July 2018. - Followed by Dr. Donzetta Matters.  -She is due to repeat surveillance carotid arterial dopplers. -continue ASA, Plavix and statin.   4. NSVT:  -14 beat run on tele, but pt completely asymptomatic. - EF normal on echo. - K normal.  - Home metoprolol dose recently increased from 25 mg to 50 mg - discussed with her primary Cardiologist - she had nuclear stress test in Dec 2019 with mild ischemia in the basal to mid inferolateral wall but felt to be essentially unchanged from prior study. She is asymptomatic with no angina and EF normal - will continue medical therapy with BB.  5. DM:  -Hgb A1c 5.8 on admit, but CBGs have been elevated during admission up into the 300s.   -Management per primary team.   6. HTN:  -well controlled one exam today at 109/62mmHg -continue on Toprol XL 50mg  daily  7. Rt LE Wound: - 2/2 mechanical fall in Feb.  -Slow healing.  -Has been managed by wound care center.    For questions or updates, please contact [place] Please consult [email or web address] for contact info under        Signed, Fransico Him, MD  04/03/2019, 8:08 AM

## 2019-04-03 NOTE — Plan of Care (Signed)
?  Problem: Education: ?Goal: Knowledge of General Education information will improve ?Description: Including pain rating scale, medication(s)/side effects and non-pharmacologic comfort measures ?Outcome: Progressing ?  ?Problem: Health Behavior/Discharge Planning: ?Goal: Ability to manage health-related needs will improve ?Outcome: Progressing ?  ?Problem: Clinical Measurements: ?Goal: Ability to maintain clinical measurements within normal limits will improve ?Outcome: Progressing ?Goal: Will remain free from infection ?Outcome: Progressing ?Goal: Diagnostic test results will improve ?Outcome: Progressing ?Goal: Cardiovascular complication will be avoided ?Outcome: Progressing ?  ?Problem: Activity: ?Goal: Risk for activity intolerance will decrease ?Outcome: Progressing ?  ?Problem: Nutrition: ?Goal: Adequate nutrition will be maintained ?Outcome: Progressing ?  ?Problem: Coping: ?Goal: Level of anxiety will decrease ?Outcome: Progressing ?  ?Problem: Elimination: ?Goal: Will not experience complications related to bowel motility ?Outcome: Progressing ?  ?Problem: Pain Managment: ?Goal: General experience of comfort will improve ?Outcome: Progressing ?  ?Problem: Safety: ?Goal: Ability to remain free from injury will improve ?Outcome: Progressing ?  ?Problem: Skin Integrity: ?Goal: Risk for impaired skin integrity will decrease ?Outcome: Progressing ?  ?

## 2019-04-03 NOTE — Progress Notes (Addendum)
PROGRESS NOTE  Felicia BeckmannSharon Rivas ZHY:[id number] DOB: 07/05/1956 DOA: 03/26/2019 PCP: Felicia ChenJohns, [name], MD  HPI/Recap of past 24 hours: Felicia BeckmannSharon Rivas is a 63 y.o. female with medical history significant of CAD sp CABG, asthma, diastolic CHF, DM 2, HTN, HLD, CVA chronic   diastolic  CHF, on 2L of O2 as needed    Presented with  Shortness of breath Gained 25 lb in the past 4 days , patient endorsed some swelling in her lower extremities she is currently on Lasix 20 mg twice a day and states she has been compliant she usually on 2 L as needed but started to use oxygen around-the-clock because she was more short of breath. No recent travel history Patient called her cardiologist who recommended to come to emergency department to be further evaluated  Endorsing that her Lasix did not make her urinate as much as it used to and she has been somewhat constipated Been having more abdominal distention than usual. 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  04/03/19: Patient was seen and examined while sitting in her chair.  She wants to go home.  She denies any chest pain or dyspnea at rest.  Bilateral lower extremity edema with hyperpigmentation noted on exam.  Assessment/Plan: Principal Problem:   Acute on chronic diastolic heart failure (HCC) Active Problems:   Asthma   DM2 (diabetes mellitus, type 2) (HCC)   Morbid obesity (HCC)   Hypertension   Renal insufficiency   Acute on chronic respiratory failure (HCC)   Constipation   CHF (congestive heart failure) (HCC)   NSVT (nonsustained ventricular tachycardia) (HCC)   Acute on chronic diastolic CHF Cardiology following Currently net  I&O -12.5 L since admission Continue strict I's and O's and daily weight Continue cardiac medications  AKI on CKD 3 Creatinine at baseline appears to be 1.44 with GFR 38 Creatinine today 1.73 with GFR of 31 Continue to avoid nephrotoxins Repeat BMP in the morning  Uncontrolled type 2 diabetes with hyperglycemia Last hemoglobin A1c 5.8 Continue insulin coverage Avoid hypoglycemia  Severe morbid obesity BMI 51 Recommend weight loss outpatient with healthy dieting and regular physical activity  Hypovolemic hyponatremia Sodium 133 Continue fluid restriction  Chronic normocytic anemia Hemoglobin appears to be close to baseline Hemoglobin 10.7 on 03/30/2019 Baseline hemoglobin 11.6 No sign of overt bleeding Monitor H&H  Physical debility/ambulatory dysfunction PT recommended home health PT Fall precaution  History of CAD/CABG -Stable, continue aspirin Plavix beta-blocker and statin, last Myoview 12/201 9 was intermediate risk study  CVA, no residual deficits, continue aspirin Plavix and statin -Stable  Chronic hip and ankle pain -Was taking naproxen for this which is unsafe in the setting of renal insufficiency and CHF -Now on Percocet as needed  Code Status: full Family Communication: patient  Disposition Plan: home when ready   Consultants:  cardiology  Procedures:  echo    Objective: Vitals:   04/02/19 2001 04/02/19 2115 04/03/19 0437 04/03/19 0754  BP: (!) 95/43  135/71 (!) 109/55  Pulse: 64  60 69  Resp: 18  18 20   Temp: 98.7 F (37.1 C)  97.8 F (36.6 C) 98 F (36.7 C)  TempSrc: Oral  Oral Oral  SpO2: 100% 100% 98% 100%  Weight:   (!) 144.9 kg   Height:        Intake/Output Summary (Last 24 hours) at 04/03/2019 0830 Last data filed at 04/03/2019 0439 Gross per 24 hour  Intake 240 ml  Output 2100 ml  Net -1860 ml   Filed Weights   04/01/19 0505 04/02/19 0347 04/03/19 0437  Weight: (!) 145.1 kg (!) 145.5 kg (!) 144.9 kg    Exam:   . General: 63 y.o. year-old female morbidly obese in no acute distress.  Alert and oriented x3. . Cardiovascular: Regular rate and rhythm with no rubs or gallops.  No thyromegaly or JVD noted.   [name]. Respiratory: Mild rales at bases with no wheezes.  Poor inspiratory effort. . Abdomen: Soft nontender nondistended with normal bowel sounds x4 quadrants. . Musculoskeletal: Lower extremity edema with hyperpigmentation affecting left lower extremity.  Right lower extremity is wrapped with wound dressing.. 2/4 pulses in all 4 extremities. [name]. Psychiatry: Mood is appropriate for condition and setting   Data Reviewed: CBC: Recent Labs  Lab 03/30/19 0432  WBC 8.7  HGB 10.7*  HCT 32.9*  MCV 91.9  PLT 224   Basic Metabolic Panel: Recent Labs  Lab 03/30/19 0432 03/31/19 0233 04/01/19 0356 04/02/19 0748 04/03/19 0353  NA 136 135 135 133* 133*  K 5.0 4.4 4.4 3.7 3.7  CL 94* 91* 89* 89* 87*  CO2 30 32 34* 32 32  GLUCOSE 241* 307* 337* 304* 319*  BUN 42* 49* 55* 60* 70*  CREATININE 1.44* 1.60* 1.55* 1.64* 1.73*  CALCIUM 9.8 10.0 9.8 9.5 9.6  MG  --   --   --  2.5*  --    GFR: Estimated Creatinine Clearance: 48.5 mL/min (A) (by C-G formula based on SCr of 1.73 mg/dL (H)). Liver Function Tests: No results for input(s): AST, ALT, ALKPHOS, BILITOT, PROT, ALBUMIN in the last 168 hours. No results for input(s): LIPASE, AMYLASE in the last 168 hours. No results for input(s): AMMONIA in the last 168 hours. Coagulation Profile: No results for input(s): INR, PROTIME in the last 168 hours. Cardiac Enzymes: No results for input(s): CKTOTAL, CKMB, CKMBINDEX, TROPONINI in the last 168 hours. BNP (last 3 results) Recent Labs    07/17/18 1123  PROBNP 399*   HbA1C: No results for input(s): HGBA1C in the last 72 hours. CBG: Recent Labs  Lab 04/02/19 0625 04/02/19 1120 04/02/19 1644 04/02/19 2156 04/03/19 0642  GLUCAP 283* 322* 394* 350* 302*   Lipid Profile: No results for input(s):  CHOL, HDL, LDLCALC, TRIG, CHOLHDL, LDLDIRECT in the last 72 hours. Thyroid Function Tests: No results for input(s): TSH, T4TOTAL, FREET4, T3FREE, THYROIDAB in the last 72 hours. Anemia Panel: No results for input(s): VITAMINB12, FOLATE, FERRITIN, TIBC, IRON, RETICCTPCT in the last 72 hours. Urine analysis:    Component Value Date/Time   COLORURINE YELLOW 12/14/2018 2225   APPEARANCEUR HAZY (A) 12/14/2018 2225   LABSPEC 1.013 12/14/2018 2225   PHURINE 5.0 12/14/2018 2225   GLUCOSEU 50 (A) 12/14/2018 2225   HGBUR MODERATE (A) 12/14/2018 2225   BILIRUBINUR NEGATIVE 12/14/2018 2225   KETONESUR NEGATIVE 12/14/2018 2225   PROTEINUR NEGATIVE 12/14/2018 2225   UROBILINOGEN 0.2 11/10/2014 1340   NITRITE NEGATIVE 12/14/2018 2225   LEUKOCYTESUR NEGATIVE 12/14/2018 2225   Sepsis Labs: @LABRCNTIP (procalcitonin:4,lacticidven:4)  ) Recent Results (from the past 240 hour(s))  SARS Coronavirus 2 (CEPHEID - Performed in [facility]), Hosp Order     Status: None  Collection Time: 03/26/19  9:04 PM   Specimen: Nasopharyngeal Swab  Result Value Ref Range Status   SARS Coronavirus 2 NEGATIVE NEGATIVE Final    Comment: (NOTE) If result is NEGATIVE SARS-CoV-2 target nucleic acids are NOT DETECTED. The SARS-CoV-2 RNA is generally detectable in upper and lower  respiratory specimens during the acute phase of infection. The lowest  concentration of SARS-CoV-2 viral copies this assay can detect is 250  copies / mL. A negative result does not preclude SARS-CoV-2 infection  and should not be used as the sole basis for treatment or other  patient management decisions.  A negative result may occur with  improper specimen collection / handling, submission of specimen other  than nasopharyngeal swab, presence of viral mutation(s) within the  areas targeted by this assay, and inadequate number of viral copies  (<250 copies / mL). A negative result must be combined with clinical  observations,  patient history, and epidemiological information. If result is POSITIVE SARS-CoV-2 target nucleic acids are DETECTED. The SARS-CoV-2 RNA is generally detectable in upper and lower  respiratory specimens dur ing the acute phase of infection.  Positive  results are indicative of active infection with SARS-CoV-2.  Clinical  correlation with patient history and other diagnostic information is  necessary to determine patient infection status.  Positive results do  not rule out bacterial infection or co-infection with other viruses. If result is PRESUMPTIVE POSTIVE SARS-CoV-2 nucleic acids MAY BE PRESENT.   A presumptive positive result was obtained on the submitted specimen  and confirmed on repeat testing.  While 2019 novel coronavirus  (SARS-CoV-2) nucleic acids may be present in the submitted sample  additional confirmatory testing may be necessary for epidemiological  and / or clinical management purposes  to differentiate between  SARS-CoV-2 and other Sarbecovirus currently known to infect humans.  If clinically indicated additional testing with an alternate test  methodology [phone]) is advised. The SARS-CoV-2 RNA is generally  detectable in upper and lower respiratory sp ecimens during the acute  phase of infection. The expected result is Negative. Fact Sheet for Patients:  StrictlyIdeas.no Fact Sheet for Healthcare Providers: BankingDealers.co.za This test is not yet approved or cleared by the [place] FDA and has been authorized for detection and/or diagnosis of SARS-CoV-2 by FDA under an Emergency Use Authorization (EUA).  This EUA will remain in effect (meaning this test can be used) for the duration of the COVID-19 declaration under Section 564(b)(1) of the Act, 21 U.S.C. section 360bbb-3(b)(1), unless the authorization is terminated or revoked sooner. Performed at [facility], [place] [address] [place], [place]  [address]       Studies: No results found.  Scheduled Meds: . aspirin EC  81 mg Oral Daily  . atorvastatin  80 mg Oral Daily  . clopidogrel  75 mg Oral Daily  . enoxaparin (LOVENOX) injection  70 mg Subcutaneous Daily  . gabapentin  400 mg Oral TID  . insulin aspart  0-5 Units Subcutaneous QHS  . insulin aspart  0-9 Units Subcutaneous TID WC  . insulin regular human CONCENTRATED  50 Units Subcutaneous Q breakfast  . insulin regular human CONCENTRATED  50 Units Subcutaneous QAC supper  . metoprolol succinate  50 mg Oral Daily  . mometasone-formoterol  2 puff Inhalation BID  . pantoprazole  40 mg Oral Q1200  . senna  1 tablet Oral BID  . sodium chloride flush  3 mL Intravenous Once  . sodium chloride flush  3 mL Intravenous  Q12H  . torsemide  40 mg Oral BID    Continuous Infusions: . sodium chloride Stopped (03/29/19 0907)     LOS: 7 days     Darlin Droparole N Jahnay Lantier, MD [facility] Pager [phone]  If 7PM-7AM, please contact night-coverage [email or web address] Password [email or web address] 04/03/2019, 8:30 AM

## 2019-04-03 NOTE — TOC Initial Note (Signed)
Transition of Care [facility]) - Initial/Assessment Note    Patient Details  Name: Felicia Rivas MRN: [id number] Date of Birth: 1955/11/22  [facility]) CM/SW Contact:    Candie Chroman, LCSW Phone Number: 04/03/2019, 3:33 PM  Clinical Narrative: CSW met with patient, introduced role, and explained that therapy recommendations would be discussed. Family member [name] on speaker phone. Patient agreeable to home health PT, OT, and RN. First preference agency is [facility] because she has worked with them before. Made referral to [facility]. Patient already has a bedside commode but is asking about a shower chair and elevated toilet seat. Will order when discharged. Patient already on home oxygen through [place]. No further concerns. CSW encouraged patient to contact CSW as needed. CSW will continue to follow patient for support and facilitate return home once stable for discharge.            Expected Discharge Plan: [place] Barriers to Discharge: Continued Medical Work up   Patient Goals and CMS Choice   CMS Medicare.gov Compare Post Acute Care list provided to:: Patient    Expected Discharge Plan and Services Expected Discharge Plan: [place] Choice: Home Health, Durable Medical Equipment Living arrangements for the past 2 months: Single Family Home                 DME Arranged: Shower stool, Eelevated commode seat         HH Arranged: PT, OT, RN [place] Agency: [place] Date [facility] Agency Contacted: 04/03/19   Representative spoke with at [place]: Adela Lank  Prior Living Arrangements/Services Living arrangements for the past 2 months: [place] Lives with:: Adult Children Patient language and need for interpreter reviewed:: Yes(No needs) Do you feel safe going back to the place where you live?: Yes      Need for Family Participation in Patient Care: Yes (Comment) Care giver support system in place?:  Yes (comment) Current home services: DME Criminal Activity/Legal Involvement Pertinent to Current Situation/Hospitalization: No - Comment as needed  Activities of Daily Living Home Assistive Devices/Equipment: Walker (specify type), Wheelchair, Bedside commode/3-in-1, Blood pressure cuff, CBG Meter, Eyeglasses, Oxygen ADL Screening (condition at time of admission) Patient's cognitive ability adequate to safely complete daily activities?: Yes Is the patient deaf or have difficulty hearing?: No Does the patient have difficulty seeing, even when wearing glasses/contacts?: No Does the patient have difficulty concentrating, remembering, or making decisions?: No Patient able to express need for assistance with ADLs?: Yes Does the patient have difficulty dressing or bathing?: Yes(due to skin grafts right leg) Independently performs ADLs?: No Communication: Independent Dressing (OT): Needs assistance Is this a change from baseline?: Pre-admission baseline Grooming: Needs assistance Is this a change from baseline?: Pre-admission baseline Feeding: Independent Bathing: Needs assistance Is this a change from baseline?: Pre-admission baseline Toileting: Needs assistance Is this a change from baseline?: Pre-admission baseline In/Out Bed: Needs assistance Is this a change from baseline?: Pre-admission baseline Walks in Home: Independent with device (comment) Is this a change from baseline?: Pre-admission baseline Does the patient have difficulty walking or climbing stairs?: No(unable) Weakness of Legs: Both Weakness of Arms/Hands: None  Permission Sought/Granted Permission sought to share information with : Facility [facility] administrator granted to share info w AGENCY: [facility]        Emotional Assessment Appearance:: Appears stated age Attitude/Demeanor/Rapport: Engaged, Gracious Affect (typically  observed): Accepting, Appropriate, Calm, Pleasant Orientation: : Oriented  to Self, Oriented to Place, Oriented to  Time, Oriented to Situation Alcohol / Substance Use: Never Used Psych Involvement: No (comment)  Admission diagnosis:  Left bundle branch block [I44.7] SOB (shortness of breath) [R06.02] Constipation [K59.00] Patient Active Problem List   Diagnosis Date Noted  . Coronary artery disease involving native coronary artery of native heart without angina pectoris   . NSVT (nonsustained ventricular tachycardia) ([place])   . Acute on chronic respiratory failure ([place]) 03/27/2019  . Constipation 03/27/2019  . CHF (congestive heart failure) ([place]) 03/27/2019  . Acute on chronic diastolic heart failure ([place]) 03/26/2019  . Acute exacerbation of CHF (congestive heart failure) ([place]) 03/26/2019  . Pressure ulcer of right heel, unspecified stage 12/15/2018  . Acute renal injury ([place]) 12/14/2018  . Perianal abscess 12/14/2018  . Abnormal EKG 07/17/2018  . Metatarsalgia of both feet 07/16/2018  . Other acquired hammer toe 07/16/2018  . Diabetic peripheral neuropathy ([place]) 07/04/2018  . Diabetic polyneuropathy associated with type 2 diabetes mellitus ([place]) 04/27/2018  . Psychophysiological insomnia 03/19/2018  . Uncontrolled type 2 diabetes mellitus with background retinopathy and macular edema ([place]) 03/19/2018  . Degeneration of lumbar intervertebral disc 02/08/2018  . Weakness of left leg 01/19/2018  . Vitamin D deficiency 09/22/2016  . Past history of myocardial infarction 04/18/2016  . Diabetic retinopathy associated with type 2 diabetes mellitus ([place]) 11/19/2015  . Long-term insulin use ([place]) 11/19/2015  . Acute on chronic diastolic congestive heart failure ([place]) 11/14/2014  . ACE-inhibitor cough 11/10/2014  . History of asthma 11/10/2014  . Carotid artery disease ([place]) 11/10/2014  . Cough 11/10/2014  . LVH (left ventricular hypertrophy) 11/10/2014  . Shortness of breath 11/10/2014  . Hypotension 11/10/2014  . Renal insufficiency 11/10/2014  .  Lightheadedness 11/10/2014  . Bronchitis, acute 11/10/2014  . Carotid stenosis   . Pain in joint, ankle and foot 04/05/2013  . Mild intermittent asthma without complication 23/34/3568  . Preventative health care 11/26/2011  . Hypertension 11/25/2011  . Incisional infection 11/03/2011  . Retinopathy 10/18/2011  . Hx of CABG 2012   . Chronic diastolic heart failure ([place])   . HLD (hyperlipidemia)   . Morbid obesity ([place]) 09/01/2011  . Asthma 08/31/2011  . DM2 (diabetes mellitus, type 2) ([place]) 08/31/2011   PCP:  Marjory Sneddon, MD Pharmacy:   [facility] [place] [address] [place] [address] Phone: [phone] Fax: [phone]     Social Determinants of Health (SDOH) Interventions    Readmission Risk Interventions No flowsheet data found.

## 2019-04-03 NOTE — Progress Notes (Signed)
Inpatient Diabetes Program Recommendations  AACE/ADA: New Consensus Statement on Inpatient Glycemic Control (2015)  Target Ranges:  Prepandial:   less than 140 mg/dL      Peak postprandial:   less than 180 mg/dL (1-2 hours)      Critically ill patients:  140 - 180 mg/dL   Lab Results  Component Value Date   GLUCAP 302 (H) 04/03/2019   HGBA1C 5.8 (H) 03/27/2019    Review of Glycemic Control Results for Felicia Rivas, Felicia Rivas (MRN [id number]) as of 04/03/2019 09:33  Ref. Range 04/02/2019 16:44 04/02/2019 21:56 04/03/2019 06:42  Glucose-Capillary Latest Ref Range: 70 - 99 mg/dL 394 (H) 350 (H) 302 (H)   Diabetes history:Type 2 DM Outpatient Diabetes medications:U-500 20 units QAM, 10-15 units QPM Current orders for Inpatient glycemic control:Novolog 0-9 units TID, Novolog 0-5 units QHS, U-50050units QAM, 50units QPM, Lantus 20 units X 2 doses QAM  Inpatient Diabetes Program Recommendations:  Glucose trends continue to remain >300's mg/dL. Verified changes made to injection sites to ensure absorption.   Consider increasing correction to Novolog 0-20 units TID and increase U-500 to 60 units BID.   Thanks, Bronson Curb, MSN, RNC-OB Diabetes Coordinator [phone] (8a-5p)

## 2019-04-04 LAB — GLUCOSE, CAPILLARY
Glucose-Capillary: 125 mg/dL — ABNORMAL HIGH (ref 70–99)
Glucose-Capillary: 131 mg/dL — ABNORMAL HIGH (ref 70–99)
Glucose-Capillary: 125 mg/dL — ABNORMAL HIGH (ref 70–99)
Glucose-Capillary: 221 mg/dL — ABNORMAL HIGH (ref 70–99)
Glucose-Capillary: 220 mg/dL — ABNORMAL HIGH (ref 70–99)
Glucose-Capillary: 163 mg/dL — ABNORMAL HIGH (ref 70–99)

## 2019-04-04 LAB — BASIC METABOLIC PANEL
Anion gap: 14 (ref 5–15)
BUN: 78 mg/dL — ABNORMAL HIGH (ref 8–23)
CO2: 34 mmol/L — ABNORMAL HIGH (ref 22–32)
Calcium: 9.5 mg/dL (ref 8.9–10.3)
Chloride: 88 mmol/L — ABNORMAL LOW (ref 98–111)
Creatinine, Ser: 1.82 mg/dL — ABNORMAL HIGH (ref 0.44–1.00)
GFR calc Af Amer: 33 mL/min — ABNORMAL LOW (ref >60)
GFR calc non Af Amer: 29 mL/min — ABNORMAL LOW (ref >60)
Glucose, Bld: 137 mg/dL — ABNORMAL HIGH (ref 70–99)
Potassium: 3.5 mmol/L (ref 3.5–5.1)
Sodium: 136 mmol/L (ref 135–145)

## 2019-04-04 MED ORDER — ALUM & MAG HYDROXIDE-SIMETH 200-200-20 MG/5ML PO SUSP
30.0000 mL | ORAL | Status: DC | PRN
  Administered 2019-04-04 – 2019-04-05 (×3): 30 mL via ORAL
  Filled 2019-04-04 (×3): qty 30

## 2019-04-04 MED ORDER — TORSEMIDE 20 MG PO TABS
20.0000 mg | ORAL_TABLET | Freq: Two times a day (BID) | ORAL | Status: DC
  Administered 2019-04-04: 20 mg via ORAL
  Filled 2019-04-04 (×2): qty 1

## 2019-04-04 NOTE — Progress Notes (Signed)
Occupational Therapy Treatment Patient Details Name: Felicia Rivas MRN: [id number] DOB: 23-Oct-1955 Today's Date: 04/04/2019    History of present illness Pt is a 63 y/o female with PMH of CAD s/p CABG, asthma, CHF, DM2, HTN, CVA, 2 L O2 as needed. Presenting with SOB, 25 lb weight gain in 4 days and B LE edema. Admitted for CHF exacerbation.     OT comments  Pt is eager to go home, states she feels very near her baseline. Reinforced energy conservation strategies and worked on increasing endurance through ADL. Encouraged pt to elevate feet and educated in benefits.   Follow Up Recommendations  No OT follow up;Supervision/Assistance - 24 hour    Equipment Recommendations  3 in 1 bedside commode    Recommendations for Other Services      Precautions / Restrictions Precautions Precautions: Fall Precaution Comments: skin graft RLE with ankle fx awaiting further sx       Mobility Bed Mobility               General bed mobility comments: OOB upon arrival   Transfers Overall transfer level: Modified independent Equipment used: Rolling walker (2 wheeled);None                  Balance Overall balance assessment: Needs assistance   Sitting balance-Leahy Scale: Good       Standing balance-Leahy Scale: Poor Standing balance comment: leans on sink                            ADL either performed or assessed with clinical judgement   ADL Overall ADL's : Needs assistance/impaired     Grooming: Brushing hair;Oral care;Standing;Supervision/safety Grooming Details (indicate cue type and reason): stood x 4 minutes at sink               Lower Body Dressing Details (indicate cue type and reason): pt relies on her daughter to assist with socks or avoids them, she thinks she has a sock aide Toilet Transfer: RW;BSC;Modified Independent;Stand-pivot   Toileting- Clothing Manipulation and Hygiene: Modified independent;Sit to/from stand       Functional  mobility during ADLs: Supervision/safety;Rolling walker       Vision       Perception     Praxis      Cognition Arousal/Alertness: Awake/alert Behavior During Therapy: WFL for tasks assessed/performed Overall Cognitive Status: Within Functional Limits for tasks assessed                                          Exercises     Shoulder Instructions       General Comments      Pertinent Vitals/ Pain       Pain Assessment: Faces Faces Pain Scale: Hurts a little bit Pain Location: BLE  Pain Descriptors / Indicators: Aching Pain Intervention(s): Monitored during session(encouraged elevation)  Home Living Family/patient expects to be discharged to:: Private residence                                        Prior Functioning/Environment              Frequency  Min 2X/week        Progress Toward Goals  OT Goals(current goals can  now be found in the care plan section)  Progress towards OT goals: Progressing toward goals  Acute Rehab OT Goals Patient Stated Goal: to get back to normal  OT Goal Formulation: With patient Time For Goal Achievement: 04/10/19 Potential to Achieve Goals: Good  Plan Discharge plan remains appropriate;Frequency remains appropriate    Co-evaluation                 AM-PAC OT "6 Clicks" Daily Activity     Outcome Measure   Help from another person eating meals?: None Help from another person taking care of personal grooming?: A Little Help from another person toileting, which includes using toliet, bedpan, or urinal?: None Help from another person bathing (including washing, rinsing, drying)?: A Little Help from another person to put on and taking off regular upper body clothing?: None Help from another person to put on and taking off regular lower body clothing?: A Little 6 Click Score: 21    End of Session Equipment Utilized During Treatment: Rolling walker;Oxygen  OT Visit Diagnosis:  Muscle weakness (generalized) (M62.81)   Activity Tolerance Patient tolerated treatment well   Patient Left in chair;with call bell/phone within reach   Nurse Communication          Time: [id number] OT Time Calculation (min): 23 min  Charges: OT General Charges $OT Visit: 1 Visit OT Treatments $Self Care/Home Management : 23-37 mins  Martie RoundJulie Francisca Langenderfer, OTR/L Acute Rehabilitation Services Pager: [phone] Office: [phone]   Evern BioMayberry, Nathalie Cavendish Lynn 04/04/2019, 11:31 AM

## 2019-04-04 NOTE — Progress Notes (Signed)
PROGRESS NOTE  Felicia Rivas ZOX:[id number] DOB: 07/31/1956 DOA: 03/26/2019 PCP: [name], [name], MD  HPI/Recap of past 24 hours: Felicia BeckmannSharon Rivas is a 63 y.o. female with medical history significant of CAD sp CABG, asthma, diastolic CHF, DM 2, HTN, HLD, CVA chronic   diastolic  CHF, on 2L of O2 as needed    Presented with  Shortness of breath Gained 25 lb in the past 4 days , patient endorsed some swelling in her lower extremities she is currently on Lasix 20 mg twice a day and states she has been compliant she usually on 2 L as needed but started to use oxygen around-the-clock because she was more short of breath. No recent travel history Patient called her cardiologist who recommended to come to emergency department to be further evaluated  Endorsing that her Lasix did not make her urinate as much as it used to and she has been somewhat constipated Been having more abdominal distention than usual. 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  04/04/19: Patient was seen and examined at her bedside.  She denies chest pain or Dyspnea at rest.  Persistent bilateral lower extremity edema with hyperpigmentation possibly secondary to chronic venous stasis.  Wound care specialist consulted for right lower extremity wound.    Assessment/Plan: Principal Problem:   Acute on chronic diastolic heart failure (HCC) Active Problems:   Asthma   DM2 (diabetes mellitus, type 2) (HCC)   Morbid obesity (HCC)   Hypertension   Renal insufficiency   Acute on chronic respiratory failure (HCC)   Constipation   CHF (congestive heart failure) (HCC)   NSVT (nonsustained ventricular tachycardia) (HCC)  Coronary artery disease involving native coronary artery of native heart without angina pectoris   Acute on chronic diastolic CHF Cardiology following, ongoing diuresing Currently net I&O -12.5 L >>-14.5L since admission [facility] now on 20 mg twice daily Continue strict I's and O's and daily weight Continue cardiac medications  Worsening AKI on CKD 3 Creatinine at baseline appears to be 1.44 with GFR 38 Creatinine 1.73 >> 1.82  Demadex dose reduced from 40 mg twice daily to 20 mg daily C/ to avoid nephrotoxins Repeat BMP in the morning  Chronic RLE wound Per wound care specialist on 03/27/2019:Dressing procedure/placement/frequency: Dressings to be left in placed, only changed at the wound care center due to graft placement. This is a weekly visit, she self reports.  Will update EMR to indicate no dressing changes needed, graft material should not be disrupted.   Wound care specialist re-consulted on 04/03/2019.  Uncontrolled type 2 diabetes with hyperglycemia Last hemoglobin A1c 5.8 Continue insulin coverage Avoid hypoglycaemia  Severe morbid obesity BMI 51 Recommend weight loss outpatient with healthy dieting and regular physical activity  Resolved hypovolemic hyponatremia Sodium 133>>136 C/w fluid restriction  Chronic normocytic anemia Hemoglobin appears to be close to baseline Hemoglobin 10.7 on 03/30/2019 Baseline hemoglobin 11.6 No sign of overt bleeding Monitor H&H  Physical debility/ambulatory dysfunction PT recommended home health PT Fall precaution  History of CAD/CABG -Stable, continue aspirin Plavix beta-blocker and statin, last Myoview 12/201 9 was intermediate risk study  CVA, no residual deficits, continue aspirin Plavix and statin -Stable  Chronic hip and ankle pain -Was taking naproxen for this which is unsafe in the setting of renal insufficiency and CHF -Now on Percocet as needed  Code Status: full Family  Communication: patient  Disposition Plan:  Home possibly tomorrow 04/05/19 or when cardiology signs off.   Consultants:  Cardiology  Wound care specialist  Procedures:  echo    Objective: Vitals:   04/04/19 0442 04/04/19 0752 04/04/19 0915 04/04/19 1150  BP: (!) 116/46 (!) 114/99  118/64  Pulse: 66 67 75 63  Resp: 18 16 16 18   Temp: 98.4 F (36.9 C) 98 F (36.7 C)  98 F (36.7 C)  TempSrc: Oral Oral  Oral  SpO2: 100% 100% 98% 96%  Weight: (!) 145.3 kg     Height:        Intake/Output Summary (Last 24 hours) at 04/04/2019 1259 Last data filed at 04/04/2019 1252 Gross per 24 hour  Intake 1080 ml  Output 2500 ml  Net -1420 ml   Filed Weights   04/02/19 0347 04/03/19 0437 04/04/19 0442  Weight: (!) 145.5 kg (!) 144.9 kg (!) 145.3 kg    Exam:  . General: 63 y.o. year-old female morbidly obese in no acute distress.  Alert and oriented x3.   . Cardiovascular: Regular rate and rhythm no rubs or gallops.  No JVD or thyromegaly noted.   [name]. Respiratory: Clear to auscultation no wheezes or rales.  Poor inspiratory effort. . Abdomen: Obese nontender nondistended with normal bowel sounds x4. . Musculoskeletal: Bilateral lower extremity edema with hyperpigmentation.  Right lower extremity wound in surgical dressing. [name]. Psychiatry: Mood is appropriate for condition and setting.   Data Reviewed: CBC: Recent Labs  Lab 03/30/19 0432  WBC 8.7  HGB 10.7*  HCT 32.9*  MCV 91.9  PLT 224   Basic Metabolic Panel: Recent Labs  Lab 03/31/19 0233 04/01/19 0356 04/02/19 0748 04/03/19 0353 04/04/19 0446  NA 135 135 133* 133* 136  K 4.4 4.4 3.7 3.7 3.5  CL 91* 89* 89* 87* 88*  CO2 32 34* 32 32 34*  GLUCOSE 307* 337* 304* 319* 137*  BUN 49* 55* 60* 70* 78*  CREATININE 1.60* 1.55* 1.64* 1.73* 1.82*  CALCIUM 10.0 9.8 9.5 9.6 9.5  MG  --   --  2.5*  --   --    GFR: Estimated Creatinine Clearance: 46.2 mL/min (A) (by C-G formula based on SCr of 1.82 mg/dL (H)). Liver  Function Tests: No results for input(s): AST, ALT, ALKPHOS, BILITOT, PROT, ALBUMIN in the last 168 hours. No results for input(s): LIPASE, AMYLASE in the last 168 hours. No results for input(s): AMMONIA in the last 168 hours. Coagulation Profile: No results for input(s): INR, PROTIME in the last 168 hours. Cardiac Enzymes: No results for input(s): CKTOTAL, CKMB, CKMBINDEX, TROPONINI in the last 168 hours. BNP (last 3 results) Recent Labs    07/17/18 1123  PROBNP 399*   HbA1C: No results for input(s): HGBA1C in the last 72 hours. CBG: Recent Labs  Lab 04/03/19 2119 04/03/19 2242 04/04/19 0443 04/04/19 0615 04/04/19 1151  GLUCAP 74 125* 131* 125* 221*   Lipid Profile: No results for input(s): CHOL, HDL, LDLCALC, TRIG, CHOLHDL, LDLDIRECT in the last 72 hours. Thyroid Function Tests: No results for input(s): TSH, T4TOTAL, FREET4, T3FREE, THYROIDAB in the last 72 hours. Anemia Panel: No results for input(s): VITAMINB12, FOLATE, FERRITIN, TIBC, IRON, RETICCTPCT in the last 72 hours. Urine analysis:    Component Value Date/Time   COLORURINE YELLOW 12/14/2018  2225   APPEARANCEUR HAZY (A) 12/14/2018 2225   LABSPEC 1.013 12/14/2018 2225   PHURINE 5.0 12/14/2018 2225   GLUCOSEU 50 (A) 12/14/2018 2225   HGBUR MODERATE (A) 12/14/2018 2225   BILIRUBINUR NEGATIVE 12/14/2018 2225   KETONESUR NEGATIVE 12/14/2018 2225   PROTEINUR NEGATIVE 12/14/2018 2225   UROBILINOGEN 0.2 11/10/2014 1340   NITRITE NEGATIVE 12/14/2018 2225   LEUKOCYTESUR NEGATIVE 12/14/2018 2225   Sepsis Labs: @LABRCNTIP (procalcitonin:4,lacticidven:4)  ) Recent Results (from the past 240 hour(s))  SARS Coronavirus 2 (CEPHEID - Performed in [facility] lab), Hosp Order     Status: None   Collection Time: 03/26/19  9:04 PM   Specimen: Nasopharyngeal Swab  Result Value Ref Range Status   SARS Coronavirus 2 NEGATIVE NEGATIVE Final    Comment: (NOTE) If result is NEGATIVE SARS-CoV-2 target nucleic acids  are NOT DETECTED. The SARS-CoV-2 RNA is generally detectable in upper and lower  respiratory specimens during the acute phase of infection. The lowest  concentration of SARS-CoV-2 viral copies this assay can detect is 250  copies / mL. A negative result does not preclude SARS-CoV-2 infection  and should not be used as the sole basis for treatment or other  patient management decisions.  A negative result may occur with  improper specimen collection / handling, submission of specimen other  than nasopharyngeal swab, presence of viral mutation(s) within the  areas targeted by this assay, and inadequate number of viral copies  (<250 copies / mL). A negative result must be combined with clinical  observations, patient history, and epidemiological information. If result is POSITIVE SARS-CoV-2 target nucleic acids are DETECTED. The SARS-CoV-2 RNA is generally detectable in upper and lower  respiratory specimens dur ing the acute phase of infection.  Positive  results are indicative of active infection with SARS-CoV-2.  Clinical  correlation with patient history and other diagnostic information is  necessary to determine patient infection status.  Positive results do  not rule out bacterial infection or co-infection with other viruses. If result is PRESUMPTIVE POSTIVE SARS-CoV-2 nucleic acids MAY BE PRESENT.   A presumptive positive result was obtained on the submitted specimen  and confirmed on repeat testing.  While 2019 novel coronavirus  (SARS-CoV-2) nucleic acids may be present in the submitted sample  additional confirmatory testing may be necessary for epidemiological  and / or clinical management purposes  to differentiate between  SARS-CoV-2 and other Sarbecovirus currently known to infect humans.  If clinically indicated additional testing with an alternate test  methodology [phone]([id number]) is advised. The SARS-CoV-2 RNA is generally  detectable in upper and lower respiratory sp ecimens  during the acute  phase of infection. The expected result is Negative. Fact Sheet for Patients:  BoilerBrush.com.cyhttps://www.fda.gov/media/136312/download Fact Sheet for Healthcare Providers: https://pope.com/https://www.fda.gov/media/136313/download This test is not yet approved or cleared by the [place] FDA and has been authorized for detection and/or diagnosis of SARS-CoV-2 by FDA under an Emergency Use Authorization (EUA).  This EUA will remain in effect (meaning this test can be used) for the duration of the COVID-19 declaration under Section 564(b)(1) of the Act, 21 U.S.C. section 360bbb-3(b)(1), unless the authorization is terminated or revoked sooner. Performed at [facility], [address] [place], [place] [address]       Studies: No results found.  Scheduled Meds: . aspirin EC  81 mg Oral Daily  . atorvastatin  80 mg Oral Daily  . clopidogrel  75 mg Oral Daily  . enoxaparin (LOVENOX) injection  70  mg Subcutaneous Daily  . gabapentin  400 mg Oral TID  . insulin aspart  0-20 Units Subcutaneous TID WC  . insulin aspart  0-5 Units Subcutaneous QHS  . insulin aspart  0-5 Units Subcutaneous QHS  . insulin regular human CONCENTRATED  50 Units Subcutaneous QAC supper  . insulin regular human CONCENTRATED  50 Units Subcutaneous BID WC  . metoprolol succinate  50 mg Oral Daily  . mometasone-formoterol  2 puff Inhalation BID  . pantoprazole  40 mg Oral Q1200  . senna  1 tablet Oral BID  . sodium chloride flush  3 mL Intravenous Once  . sodium chloride flush  3 mL Intravenous Q12H  . torsemide  20 mg Oral BID    Continuous Infusions: . sodium chloride Stopped (03/29/19 0907)     LOS: 8 days     Kayleen Memos, MD [facility] Pager [phone]  If 7PM-7AM, please contact night-coverage [email or web address] Password [facility] 04/04/2019, 12:59 PM

## 2019-04-04 NOTE — Progress Notes (Signed)
Progress Note  Patient Name: Felicia Rivas Date of Encounter: 04/04/2019  Primary Cardiologist: Kirk Ruths, MD   Subjective   SOB has much improved and LE edema better.  NO further VT on tele  Inpatient Medications    Scheduled Meds: . aspirin EC  81 mg Oral Daily  . atorvastatin  80 mg Oral Daily  . clopidogrel  75 mg Oral Daily  . enoxaparin (LOVENOX) injection  70 mg Subcutaneous Daily  . gabapentin  400 mg Oral TID  . insulin aspart  0-20 Units Subcutaneous TID WC  . insulin aspart  0-5 Units Subcutaneous QHS  . insulin aspart  0-5 Units Subcutaneous QHS  . insulin regular human CONCENTRATED  50 Units Subcutaneous QAC supper  . insulin regular human CONCENTRATED  50 Units Subcutaneous BID WC  . metoprolol succinate  50 mg Oral Daily  . mometasone-formoterol  2 puff Inhalation BID  . pantoprazole  40 mg Oral Q1200  . senna  1 tablet Oral BID  . sodium chloride flush  3 mL Intravenous Once  . sodium chloride flush  3 mL Intravenous Q12H  . torsemide  40 mg Oral BID   Continuous Infusions: . sodium chloride Stopped (03/29/19 0907)   PRN Meds: sodium chloride, acetaminophen **OR** acetaminophen, albuterol, bisacodyl, ondansetron **OR** ondansetron (ZOFRAN) IV, oxyCODONE-acetaminophen, sodium chloride flush   Vital Signs    Vitals:   04/03/19 1355 04/03/19 2001 04/04/19 0442 04/04/19 0752  BP: (!) 84/71 (!) 106/56 (!) 116/46 (!) 114/99  Pulse: 70 65 66 67  Resp: 20 20 18 16   Temp: 97.8 F (36.6 C) 98.2 F (36.8 C) 98.4 F (36.9 C) 98 F (36.7 C)  TempSrc: Oral Oral Oral Oral  SpO2: 100% 100% 100% 100%  Weight:   (!) 145.3 kg   Height:        Intake/Output Summary (Last 24 hours) at 04/04/2019 0838 Last data filed at 04/04/2019 0836 Gross per 24 hour  Intake 1200 ml  Output 2800 ml  Net -1600 ml   Last 3 Weights 04/04/2019 04/03/2019 04/02/2019  Weight (lbs) 320 lb 4.8 oz 319 lb 8 oz 320 lb 12.8 oz  Weight (kg) 145.287 kg 144.924 kg 145.514 kg       Telemetry    NSR - Personally Reviewed  ECG    No new EKG to review  - Personally Reviewed  Physical Exam   GEN: Well nourished, well developed in no acute distress HEENT: Normal NECK: No JVD; No carotid bruits LYMPHATICS: No lymphadenopathy CARDIAC:RRR, no murmurs, rubs, gallops RESPIRATORY:  Clear to auscultation without rales, wheezing or rhonchi  ABDOMEN: Soft, non-tender, non-distended MUSCULOSKELETAL:  1-2+ LE edema; No deformity  SKIN: Warm and dry NEUROLOGIC:  Alert and oriented x 3 PSYCHIATRIC:  Normal affect    Labs    High Sensitivity Troponin:   Recent Labs  Lab 03/26/19 1953 03/27/19 0016  TROPONINIHS 13 12      Cardiac EnzymesNo results for input(s): TROPONINI in the last 168 hours. No results for input(s): TROPIPOC in the last 168 hours.   Chemistry Recent Labs  Lab 04/02/19 0748 04/03/19 0353 04/04/19 0446  NA 133* 133* 136  K 3.7 3.7 3.5  CL 89* 87* 88*  CO2 32 32 34*  GLUCOSE 304* 319* 137*  BUN 60* 70* 78*  CREATININE 1.64* 1.73* 1.82*  CALCIUM 9.5 9.6 9.5  GFRNONAA 33* 31* 29*  GFRAA 38* 36* 33*  ANIONGAP 12 14 14      Hematology Recent Labs  Lab 03/30/19 0432  WBC 8.7  RBC 3.58*  HGB 10.7*  HCT 32.9*  MCV 91.9  MCH 29.9  MCHC 32.5  RDW 14.5  PLT 224    BNP No results for input(s): BNP, PROBNP in the last 168 hours.   DDimer  No results for input(s): DDIMER in the last 168 hours.   Radiology    No results found.  Cardiac Studies   Echo 03/27/2019 IMPRESSIONS  1. The left ventricle has normal systolic function, with an ejection fraction of 55-60%. The cavity size was mildly dilated. There is moderately increased left ventricular wall thickness. Left ventricular diastolic Doppler parameters are consistent with pseudonormalization. Elevated 2. The right ventricle has normal systolic function. The cavity was normal. There is no increase in right ventricular wall thickness. 3. The mitral valve is grossly normal.  There is mild mitral annular calcification present. Trivial MR. 4. The aortic valve was not well visualized. 5. Left atrial size was moderately dilated.  Patient Profile     Felicia Rivas a 63 y.o.femalewith a hx of CAD s/p CABG x 3, Chronic diastolic CHF, morbid obesity, DM, HTN, carotid artery disease s/pright carotid stent July 2018/ PAD followed by vascular (Dr. Randie Heinzain) and HLD who is being seen for the evaluation ofCHF.   Assessment & Plan    1. Acute on Chronic Diastolic HF:  -she has been diuresing and LE edema has improved -likely some of her LE edema is related to chronic venous insuff.  -she put out 2.8L yesterday and is net neg 12.1L -weight up 1lb from yesterday and 14lbs down total from admit -creatinine continues to trend upward 1.64>>1.73>>1.82 and BUN 60>>70>>78. -her weight since March has been around 314lbs and she is near this at 320lbs today.  -nearing being ready for discharge -she was on high dose Lasix 120mg  TID on admission - ? whether she is absorbing Lasix -now on PO Torsemide and diuresing well -difficult to tell how much is fluid weight but her lungs are clear on exam and LE edema improved.  Her creatinine and BUN continue to increase.  I think some of her LE edema is due to chronic venous insuff.  I think she is intravascularly euvolemic and recommend cutting Demadex back to 20mg  BID due to uptrending creatinine -follow I&Os, daily weights and renal function -continue 2gm Na diet and fluid restriction to 1500cc  2. CAD: - s/p CABG in 2012. -no anginal sx  -Hs trop negative x 2.  -Continue ASA, Plavix, statin and ? blocker.  3. Carotid Artery Disease/ PVD:  -s/p carotid stent in July 2018. - Followed by Dr. Randie Heinzain.  -She is due to repeat surveillance carotid arterial dopplers. -continue ASA, Plavix and statin.   4. NSVT:  -14 beat run on tele, but pt completely asymptomatic. - EF normal on echo. - K normal.  - Home metoprolol dose  recently increased from 25 mg to 50 mg - discussed with her primary Cardiologist - she had nuclear stress test in Dec 2019 with mild ischemia in the basal to mid inferolateral wall but felt to be essentially unchanged from prior study. She is asymptomatic with no angina and EF normal - will continue medical therapy with BB. -no further NSVT on tele -continue Toprol XL 50mg  daily.  5. DM:  -Hgb A1c 5.8 on admit, but CBGs have been elevated during admission up into the 300s.  -Management per primary team.   6. HTN:  -better controlled on exam today at 116/46-114/5299mmHg -continue  on Toprol XL 50mg  daily  7. Rt LE Wound: - 2/2 mechanical fall in Feb.  -Slow healing.  -Has been managed by wound care center.    For questions or updates, please contact [facility] Please consult [email or web address] for contact info under        Signed, Armanda Magicraci Gerhardt Gleed, MD  04/04/2019, 8:38 AM

## 2019-04-04 NOTE — Progress Notes (Addendum)
Physical Therapy Treatment Patient Details Name: Felicia Rivas MRN: [id number] DOB: 1956/04/22 Today's Date: 04/04/2019    History of Present Illness Pt is a 63 y/o female with PMH of CAD s/p CABG, asthma, CHF, DM2, HTN, CVA, 2 L O2 as needed. Presenting with SOB, 25 lb weight gain in 4 days and B LE edema. Admitted for CHF exacerbation.      PT Comments    Pt pleasant and eager to return home. Pt continues to attempt to increase ambulation distance on 3L but it not able to accurately judge her fatigue level and last 2 sessions with this therapist has required chair brought to her due to over estimating ability despite education and cues during gait. Pt and daughter (on phone) educated for walking program and progression as well as HEP with both voicing understanding. Pt agreeable to HHPT only if it will not impact the number of visits she can receive after ankle sx.  HR 110 with gait, unable to get pulse ox reading    Follow Up Recommendations  Home health PT;Supervision for mobility/OOB(only if it will not interfere with visits she needs after ankle sx)     Equipment Recommendations  None recommended by PT    Recommendations for Other Services       Precautions / Restrictions Precautions Precautions: Fall Precaution Comments: skin graft RLE with ankle fx awaiting further sx    Mobility  Bed Mobility               General bed mobility comments: OOB upon arrival   Transfers Overall transfer level: Needs assistance Equipment used: None Transfers: Sit to/from Stand Sit to Stand: Min guard         General transfer comment: supervision to stand from recliner with armrests, minguard to stand from chair without armrests with increased momentum and struggle to rise  Ambulation/Gait Ambulation/Gait assistance: Min guard Gait Distance (Feet): 160 Feet Assistive device: Rolling walker (2 wheeled) Gait Pattern/deviations: Step-through pattern;Decreased stride length;Wide  base of support;Trunk flexed   Gait velocity interpretation: >2.62 ft/sec, indicative of community ambulatory General Gait Details: pt very eager to progress gait but unable to accurately judge her tolerance despite cues. After walking 160' pt began to veer into wall to left despite cues and assist with increased trunk flexion and required chair pulled to her and seated rest prior to walking 36' back to room   Stairs             Wheelchair Mobility    Modified Rankin (Stroke Patients Only)       Balance Overall balance assessment: Needs assistance   Sitting balance-Leahy Scale: Good     Standing balance support: Bilateral upper extremity supported Standing balance-Leahy Scale: Poor Standing balance comment: bil Ue support for standing and gait                            Cognition Arousal/Alertness: Awake/alert Behavior During Therapy: WFL for tasks assessed/performed Overall Cognitive Status: Within Functional Limits for tasks assessed                                        Exercises General Exercises - Lower Extremity Long Arc Quad: 20 reps;AROM;Both;Seated Hip Flexion/Marching: Both;Seated;15 reps;AAROM    General Comments        Pertinent Vitals/Pain Pain Assessment: 0-10 Faces Pain Scale: Hurts a  little bit Pain Location: BLE  Pain Descriptors / Indicators: Aching Pain Intervention(s): Limited activity within patient's tolerance;Monitored during session;Repositioned    Home Living Family/patient expects to be discharged to:: Private residence                    Prior Function            PT Goals (current goals can now be found in the care plan section) Acute Rehab PT Goals Patient Stated Goal: to get back to normal  Progress towards PT goals: Progressing toward goals    Frequency           PT Plan Current plan remains appropriate    Co-evaluation              AM-PAC PT "6 Clicks" Mobility    Outcome Measure  Help needed turning from your back to your side while in a flat bed without using bedrails?: A Little Help needed moving from lying on your back to sitting on the side of a flat bed without using bedrails?: A Little Help needed moving to and from a bed to a chair (including a wheelchair)?: None Help needed standing up from a chair using your arms (e.g., wheelchair or bedside chair)?: None Help needed to walk in hospital room?: A Little Help needed climbing 3-5 steps with a railing? : A Lot 6 Click Score: 19    End of Session Equipment Utilized During Treatment: Oxygen Activity Tolerance: Patient tolerated treatment well Patient left: in chair;with call bell/phone within reach Nurse Communication: Mobility status PT Visit Diagnosis: Other abnormalities of gait and mobility (R26.89);Unsteadiness on feet (R26.81);Muscle weakness (generalized) (M62.81)     Time: [id number] PT Time Calculation (min) (ACUTE ONLY): 25 min  Charges:  $Gait Training: 8-22 mins $Therapeutic Exercise: 8-22 mins                     Felicia Rivas, PT Acute Rehabilitation Services Pager: [phone] Office: [phone]    Felicia Rivas 04/04/2019, 12:34 PM

## 2019-04-04 NOTE — Progress Notes (Signed)
Patient called me to room she had unwrapped her right leg dressings off leg wanted me to look at wound writer dressed wound back with dry clean dressings she did have some slough on the back of her heel writer cleaned it off with saline patted dry with guaze then applied dressing. I have not seen wound care nurse per wound care consult today.

## 2019-04-04 NOTE — Plan of Care (Signed)
?  Problem: Education: ?Goal: Knowledge of General Education information will improve ?Description: Including pain rating scale, medication(s)/side effects and non-pharmacologic comfort measures ?Outcome: Progressing ?  ?Problem: Health Behavior/Discharge Planning: ?Goal: Ability to manage health-related needs will improve ?Outcome: Progressing ?  ?Problem: Clinical Measurements: ?Goal: Ability to maintain clinical measurements within normal limits will improve ?Outcome: Progressing ?Goal: Will remain free from infection ?Outcome: Progressing ?Goal: Diagnostic test results will improve ?Outcome: Progressing ?Goal: Cardiovascular complication will be avoided ?Outcome: Progressing ?  ?Problem: Activity: ?Goal: Risk for activity intolerance will decrease ?Outcome: Progressing ?  ?Problem: Nutrition: ?Goal: Adequate nutrition will be maintained ?Outcome: Progressing ?  ?Problem: Coping: ?Goal: Level of anxiety will decrease ?Outcome: Progressing ?  ?Problem: Elimination: ?Goal: Will not experience complications related to bowel motility ?Outcome: Progressing ?  ?Problem: Pain Managment: ?Goal: General experience of comfort will improve ?Outcome: Progressing ?  ?Problem: Safety: ?Goal: Ability to remain free from injury will improve ?Outcome: Progressing ?  ?Problem: Skin Integrity: ?Goal: Risk for impaired skin integrity will decrease ?Outcome: Progressing ?  ?

## 2019-04-05 ENCOUNTER — Other Ambulatory Visit: Payer: Self-pay | Admitting: *Deleted

## 2019-04-05 ENCOUNTER — Ambulatory Visit: Payer: BC Managed Care – PPO | Admitting: Family

## 2019-04-05 ENCOUNTER — Telehealth: Payer: Self-pay | Admitting: *Deleted

## 2019-04-05 DIAGNOSIS — I5033 Acute on chronic diastolic (congestive) heart failure: Secondary | ICD-10-CM

## 2019-04-05 DIAGNOSIS — I2510 Atherosclerotic heart disease of native coronary artery without angina pectoris: Secondary | ICD-10-CM

## 2019-04-05 DIAGNOSIS — E876 Hypokalemia: Secondary | ICD-10-CM

## 2019-04-05 DIAGNOSIS — I739 Peripheral vascular disease, unspecified: Secondary | ICD-10-CM

## 2019-04-05 DIAGNOSIS — L97519 Non-pressure chronic ulcer of other part of right foot with unspecified severity: Secondary | ICD-10-CM

## 2019-04-05 LAB — BASIC METABOLIC PANEL
Anion gap: 13 (ref 5–15)
Anion gap: 15 (ref 5–15)
Anion gap: 15 (ref 5–15)
BUN: 78 mg/dL — ABNORMAL HIGH (ref 8–23)
BUN: 81 mg/dL — ABNORMAL HIGH (ref 8–23)
BUN: 79 mg/dL — ABNORMAL HIGH (ref 8–23)
CO2: 34 mmol/L — ABNORMAL HIGH (ref 22–32)
CO2: 33 mmol/L — ABNORMAL HIGH (ref 22–32)
CO2: 32 mmol/L (ref 22–32)
Calcium: 9.2 mg/dL (ref 8.9–10.3)
Calcium: 9.3 mg/dL (ref 8.9–10.3)
Calcium: 9.2 mg/dL (ref 8.9–10.3)
Chloride: 87 mmol/L — ABNORMAL LOW (ref 98–111)
Chloride: 87 mmol/L — ABNORMAL LOW (ref 98–111)
Chloride: 86 mmol/L — ABNORMAL LOW (ref 98–111)
Creatinine, Ser: 1.58 mg/dL — ABNORMAL HIGH (ref 0.44–1.00)
Creatinine, Ser: 1.63 mg/dL — ABNORMAL HIGH (ref 0.44–1.00)
Creatinine, Ser: 1.62 mg/dL — ABNORMAL HIGH (ref 0.44–1.00)
GFR calc Af Amer: 40 mL/min — ABNORMAL LOW (ref >60)
GFR calc Af Amer: 38 mL/min — ABNORMAL LOW (ref >60)
GFR calc Af Amer: 38 mL/min — ABNORMAL LOW (ref >60)
GFR calc non Af Amer: 34 mL/min — ABNORMAL LOW (ref >60)
GFR calc non Af Amer: 33 mL/min — ABNORMAL LOW (ref >60)
GFR calc non Af Amer: 33 mL/min — ABNORMAL LOW (ref >60)
Glucose, Bld: 154 mg/dL — ABNORMAL HIGH (ref 70–99)
Glucose, Bld: 205 mg/dL — ABNORMAL HIGH (ref 70–99)
Glucose, Bld: 346 mg/dL — ABNORMAL HIGH (ref 70–99)
Potassium: 2.8 mmol/L — ABNORMAL LOW (ref 3.5–5.1)
Potassium: 3.4 mmol/L — ABNORMAL LOW (ref 3.5–5.1)
Potassium: 3.6 mmol/L (ref 3.5–5.1)
Sodium: 134 mmol/L — ABNORMAL LOW (ref 135–145)
Sodium: 135 mmol/L (ref 135–145)
Sodium: 133 mmol/L — ABNORMAL LOW (ref 135–145)

## 2019-04-05 LAB — CBC
HCT: 27.2 % — ABNORMAL LOW (ref 36.0–46.0)
HCT: 28.4 % — ABNORMAL LOW (ref 36.0–46.0)
Hemoglobin: 8.9 g/dL — ABNORMAL LOW (ref 12.0–15.0)
Hemoglobin: 9.2 g/dL — ABNORMAL LOW (ref 12.0–15.0)
MCH: 29.7 pg (ref 26.0–34.0)
MCH: 29.4 pg (ref 26.0–34.0)
MCHC: 32.7 g/dL (ref 30.0–36.0)
MCHC: 32.4 g/dL (ref 30.0–36.0)
MCV: 90.7 fL (ref 80.0–100.0)
MCV: 90.7 fL (ref 80.0–100.0)
Platelets: 261 10*3/uL (ref 150–400)
Platelets: 274 10*3/uL (ref 150–400)
RBC: 3 MIL/uL — ABNORMAL LOW (ref 3.87–5.11)
RBC: 3.13 MIL/uL — ABNORMAL LOW (ref 3.87–5.11)
RDW: 14.7 % (ref 11.5–15.5)
RDW: 14.6 % (ref 11.5–15.5)
WBC: 10.2 10*3/uL (ref 4.0–10.5)
WBC: 9.3 10*3/uL (ref 4.0–10.5)
nRBC: 0 % (ref 0.0–0.2)
nRBC: 0 % (ref 0.0–0.2)

## 2019-04-05 LAB — MAGNESIUM: Magnesium: 2.7 mg/dL — ABNORMAL HIGH (ref 1.7–2.4)

## 2019-04-05 LAB — GLUCOSE, CAPILLARY: Glucose-Capillary: 205 mg/dL — ABNORMAL HIGH (ref 70–99)

## 2019-04-05 MED ORDER — TORSEMIDE 20 MG PO TABS: 40.0000 mg | ORAL_TABLET | Freq: Every day | ORAL | Status: DC

## 2019-04-05 MED ORDER — ALUM & MAG HYDROXIDE-SIMETH 200-200-20 MG/5ML PO SUSP
30.0000 mL | Freq: Once | ORAL | Status: AC
  Administered 2019-04-05: 30 mL via ORAL
  Filled 2019-04-05: qty 30

## 2019-04-05 MED ORDER — GABAPENTIN 400 MG PO CAPS: 400.0000 mg | ORAL_CAPSULE | Freq: Three times a day (TID) | ORAL | 0 refills | Status: DC

## 2019-04-05 MED ORDER — METOPROLOL SUCCINATE ER 50 MG PO TB24: 50.0000 mg | ORAL_TABLET | Freq: Every day | ORAL | 0 refills | Status: DC

## 2019-04-05 MED ORDER — POTASSIUM CHLORIDE CRYS ER 20 MEQ PO TBCR: 40.0000 meq | EXTENDED_RELEASE_TABLET | Freq: Every day | ORAL | Status: DC

## 2019-04-05 MED ORDER — POTASSIUM CHLORIDE CRYS ER 20 MEQ PO TBCR: 40.0000 meq | EXTENDED_RELEASE_TABLET | Freq: Two times a day (BID) | ORAL | 0 refills | Status: DC

## 2019-04-05 MED ORDER — OXYCODONE-ACETAMINOPHEN 5-325 MG PO TABS: 1.0000 | ORAL_TABLET | Freq: Two times a day (BID) | ORAL | 0 refills | Status: DC | PRN

## 2019-04-05 MED ORDER — TORSEMIDE 20 MG PO TABS: 20.0000 mg | ORAL_TABLET | Freq: Every evening | ORAL | 0 refills | Status: DC

## 2019-04-05 MED ORDER — POTASSIUM CHLORIDE CRYS ER 20 MEQ PO TBCR: 40.0000 meq | EXTENDED_RELEASE_TABLET | Freq: Three times a day (TID) | ORAL | 0 refills | Status: DC

## 2019-04-05 MED ORDER — ASPIRIN EC 81 MG PO TBEC: 81.0000 mg | DELAYED_RELEASE_TABLET | Freq: Every day | ORAL | 3 refills | Status: AC

## 2019-04-05 MED ORDER — TORSEMIDE 20 MG PO TABS: 40.0000 mg | ORAL_TABLET | Freq: Every day | ORAL | 0 refills | Status: DC

## 2019-04-05 MED ORDER — TORSEMIDE 20 MG PO TABS
20.0000 mg | ORAL_TABLET | Freq: Once | ORAL | Status: AC
  Administered 2019-04-05: 20 mg via ORAL

## 2019-04-05 MED ORDER — PANTOPRAZOLE SODIUM 40 MG PO TBEC: 40.0000 mg | DELAYED_RELEASE_TABLET | Freq: Every day | ORAL | 0 refills | Status: AC

## 2019-04-05 MED ORDER — TORSEMIDE 20 MG PO TABS: 20.0000 mg | ORAL_TABLET | Freq: Every evening | ORAL | Status: DC

## 2019-04-05 MED ORDER — POTASSIUM CHLORIDE CRYS ER 20 MEQ PO TBCR
40.0000 meq | EXTENDED_RELEASE_TABLET | Freq: Three times a day (TID) | ORAL | Status: DC
  Administered 2019-04-05 (×2): 40 meq via ORAL
  Filled 2019-04-05 (×2): qty 2

## 2019-04-05 NOTE — Progress Notes (Signed)
Inpatient Diabetes Program Recommendations  AACE/ADA: New Consensus Statement on Inpatient Glycemic Control (2015)  Target Ranges:  Prepandial:   less than 140 mg/dL      Peak postprandial:   less than 180 mg/dL (1-2 hours)      Critically ill patients:  140 - 180 mg/dL   Lab Results  Component Value Date   GLUCAP 205 (H) 04/05/2019   HGBA1C 5.8 (H) 03/27/2019    Review of Glycemic Control Results for Felicia Rivas, Felicia Rivas (MRN [id number]) as of 04/05/2019 10:29  Ref. Range 04/04/2019 16:24 04/04/2019 21:46 04/05/2019 06:08  Glucose-Capillary Latest Ref Range: 70 - 99 mg/dL 220 (H) 163 (H) 205 (H)   Diabetes history:Type 2 DM Outpatient Diabetes medications:U-500 20 units QAM, 10-15 units QPM Current orders for Inpatient glycemic control:Novolog 0-20 unitsTID, Novolog 0-5 units QHS, U-50050units QAM,50units QPM,   Inpatient Diabetes Program Recommendations:  Noted there were two orders within the [facility] for U-500. One for BID dosing with meals and a separate order for "daily with supper". This could potentially place patient at risk for an additional dose causing hypoglycemia.  MD: Please discontinue the U-500 order for "daily with supper" and the duplicate Novolog 0-5 units QHS.   Thanks, Bronson Curb, MSN, RNC-OB Diabetes Coordinator [phone] (8a-5p)

## 2019-04-05 NOTE — Progress Notes (Signed)
Pt states she is feeling better with no c/o at this time.

## 2019-04-05 NOTE — Telephone Encounter (Signed)
-----   Message from Abigail Butts, PA-C sent at 04/05/2019  1:54 PM EDT ----- Regarding: TOC appointment Hey there! Can you please place a TOC call to this patient. She is scheduled to see me 04/11/2019. Thank you!  Daleen Snook

## 2019-04-05 NOTE — Progress Notes (Signed)
Writer found stasis ulcer under dressing right lower extremity when changing dressing yesterday have consulted with wound care nurse what needs to be done with patient['s graft wounds and statsis wound right lower heel area she reports patient needs surgical consult at this point. I have Let Dr.Hall know what the wound care nurse recommended. Patient reports has had the right heel stasis wound since Feb 2020 she is followed by Dr.Vogler at [facility] to see about her skin grafts on anterior right lower extremitity and her right heel chronic wound. She has her big toe wrapped up right foot will not let me remove dressing to look at her big toe as well. I ended up changing dressing on right leg yesterday after patient had un wrapped leg yesterday I noticed a slight odor with dressing change from her right heel with some drainage. I left the silicon layers intact did not disturb the graft areas. Await further orders from Md for treatment orders.

## 2019-04-05 NOTE — Progress Notes (Signed)
Progress Note  Patient Name: Felicia BeckmannSharon Rivas Date of Encounter: 04/05/2019  Primary Cardiologist: Olga MillersBrian Crenshaw, MD   Subjective   Feels good with no SOB.   LE edema continues to improve.  Wants to go home  Inpatient Medications    Scheduled Meds: . alum & mag hydroxide-simeth  30 mL Oral Once  . aspirin EC  81 mg Oral Daily  . atorvastatin  80 mg Oral Daily  . clopidogrel  75 mg Oral Daily  . enoxaparin (LOVENOX) injection  70 mg Subcutaneous Daily  . gabapentin  400 mg Oral TID  . insulin aspart  0-20 Units Subcutaneous TID WC  . insulin aspart  0-5 Units Subcutaneous QHS  . insulin aspart  0-5 Units Subcutaneous QHS  . insulin regular human CONCENTRATED  50 Units Subcutaneous QAC supper  . insulin regular human CONCENTRATED  50 Units Subcutaneous BID WC  . metoprolol succinate  50 mg Oral Daily  . mometasone-formoterol  2 puff Inhalation BID  . pantoprazole  40 mg Oral Q1200  . potassium chloride  40 mEq Oral TID  . [START ON 04/06/2019] potassium chloride  40 mEq Oral Daily  . senna  1 tablet Oral BID  . sodium chloride flush  3 mL Intravenous Once  . sodium chloride flush  3 mL Intravenous Q12H  . torsemide  20 mg Oral BID   Continuous Infusions: . sodium chloride Stopped (03/29/19 0907)   PRN Meds: sodium chloride, acetaminophen **OR** acetaminophen, albuterol, alum & mag hydroxide-simeth, bisacodyl, ondansetron **OR** ondansetron (ZOFRAN) IV, oxyCODONE-acetaminophen, sodium chloride flush   Vital Signs    Vitals:   04/04/19 1150 04/04/19 1925 04/04/19 2042 04/05/19 0531  BP: 118/64 135/75  94/62  Pulse: 63 70 70 68  Resp: 18 18 (!) 22 18  Temp: 98 F (36.7 C) 98.3 F (36.8 C)  98 F (36.7 C)  TempSrc: Oral Oral  Oral  SpO2: 96% 99% 98% 100%  Weight:    (!) 144.3 kg  Height:        Intake/Output Summary (Last 24 hours) at 04/05/2019 0812 Last data filed at 04/05/2019 0810 Gross per 24 hour  Intake 1560 ml  Output 801 ml  Net 759 ml   Last 3  Weights 04/05/2019 04/04/2019 04/03/2019  Weight (lbs) 318 lb 3.2 oz 320 lb 4.8 oz 319 lb 8 oz  Weight (kg) 144.335 kg 145.287 kg 144.924 kg      Telemetry    NSR with ? SVT for 6 beast - Personally Reviewed  ECG    No new EKG to review  - Personally Reviewed  Physical Exam   GEN: Well nourished, well developed in no acute distress HEENT: Normal NECK: No JVD; No carotid bruits LYMPHATICS: No lymphadenopathy CARDIAC:RRR, no murmurs, rubs, gallops RESPIRATORY:  Clear to auscultation without rales, wheezing or rhonchi  ABDOMEN: Soft, non-tender, non-distended MUSCULOSKELETAL:  1+ LE edema; No deformity  SKIN: Warm and dry NEUROLOGIC:  Alert and oriented x 3 PSYCHIATRIC:  Normal affect    Labs    High Sensitivity Troponin:   Recent Labs  Lab 03/26/19 1953 03/27/19 0016  TROPONINIHS 13 12      Cardiac EnzymesNo results for input(s): TROPONINI in the last 168 hours. No results for input(s): TROPIPOC in the last 168 hours.   Chemistry Recent Labs  Lab 04/04/19 0446 04/05/19 0154 04/05/19 0603  NA 136 134* 135  K 3.5 2.8* 3.4*  CL 88* 87* 87*  CO2 34* 34* 33*  GLUCOSE 137*  154* 205*  BUN 78* 78* 81*  CREATININE 1.82* 1.58* 1.63*  CALCIUM 9.5 9.2 9.3  GFRNONAA 29* 34* 33*  GFRAA 33* 40* 38*  ANIONGAP 14 13 15      Hematology Recent Labs  Lab 03/30/19 0432 04/05/19 0154 04/05/19 0603  WBC 8.7 10.2 9.3  RBC 3.58* 3.00* 3.13*  HGB 10.7* 8.9* 9.2*  HCT 32.9* 27.2* 28.4*  MCV 91.9 90.7 90.7  MCH 29.9 29.7 29.4  MCHC 32.5 32.7 32.4  RDW 14.5 14.7 14.6  PLT 224 261 274    BNP No results for input(s): BNP, PROBNP in the last 168 hours.   DDimer  No results for input(s): DDIMER in the last 168 hours.   Radiology    No results found.  Cardiac Studies   Echo 03/27/2019 IMPRESSIONS  1. The left ventricle has normal systolic function, with an ejection fraction of 55-60%. The cavity size was mildly dilated. There is moderately increased left ventricular  wall thickness. Left ventricular diastolic Doppler parameters are consistent with pseudonormalization. Elevated 2. The right ventricle has normal systolic function. The cavity was normal. There is no increase in right ventricular wall thickness. 3. The mitral valve is grossly normal. There is mild mitral annular calcification present. Trivial MR. 4. The aortic valve was not well visualized. 5. Left atrial size was moderately dilated.  Patient Profile     Felicia Rivas a 63 y.o.femalewith a hx of CAD s/p CABG x 3, Chronic diastolic CHF, morbid obesity, DM, HTN, carotid artery disease s/pright carotid stent July 2018/ PAD followed by vascular (Dr. Donzetta Matters) and HLD who is being seen for the evaluation ofCHF.   Assessment & Plan    1. Acute on Chronic Diastolic HF:  -she has been diuresing and LE edema has improved -likely some of her LE edema is related to chronic venous insuff.  -she put out 801cc yesterday and is net neg 13.6L -weight down 2lbs from yesterday and 16lbs down total from admit -creatinine improving with diuresis (1.82>1.58>1.63). -her weight since March has been around 314lbs and she is near this at 318lbs today.  -she was on high dose Lasix 120mg  TID on admission - ? whether she is absorbing Lasix -now on PO Torsemide and diuresing well although UOP dropped off yesterday after decreasing Demadex to 20mg  BID due to bumped creatinine the other day -I think some of her LE edema is due to chronic venous insuff.  I think she is intravascularly near euvolemic -will increase Torsemide to 40mg  qam and 20mg  qpm -follow I&Os, daily weights and renal function-continue 2gm Na diet and fluid restriction to 1500cc  2. CAD: - s/p CABG in 2012. -no anginal sx  -Hs trop negative x 2.  -Continue ASA, Plavix, statin and ? blocker.  3. Carotid Artery Disease/ PVD:  -s/p carotid stent in July 2018. - Followed by Dr. Donzetta Matters.  -She is due to repeat surveillance carotid arterial  dopplers. -continue ASA, Plavix and statin.   4. NSVT:  -14 beat run on tele, but pt completely asymptomatic. - EF normal on echo. - K normal.  - Home metoprolol dose recently increased from 25 mg to 50 mg - discussed with her primary Cardiologist - she had nuclear stress test in Dec 2019 with mild ischemia in the basal to mid inferolateral wall but felt to be essentially unchanged from prior study. She is asymptomatic with no angina and EF normal - will continue medical therapy with BB. -no further NSVT on tele -continue Toprol XL  50mg  daily.  5.  Hypokalemia -would like K+>4 -replete K+ today  6. DM:  -Hgb A1c 5.8 on admit, but CBGs have been elevated during admission up into the 300s.  -Management per primary team.   7. HTN:  -BP soft this am at 94/7262mmHg early this am but normal yesterday throughout the day -continue on Toprol XL 50mg  daily  8. Rt LE Wound: - 2/2 mechanical fall in Feb.  -Slow healing.  -Has been managed by wound care center.   [facility] will sign off.   Medication Recommendations:  Torsemide 40mg  qam and 20mg  qpm, Toprol XL 50mg  daily, ASA 81mg  daily, Plavix 75mg  daily,Lipitor 80mg  daily.  Potassium dose to be determined at discharge Other recommendations (labs, testing, etc):  BMET in 1 week Follow up as an outpatient:  TOC followup in 1 week with Dr. Jens Somrenshaw or PA in office  For questions or updates, please contact [facility] Please consult [email or web address] for contact info under        Signed, Armanda Magicraci Turner, MD  04/05/2019, 8:12 AM

## 2019-04-05 NOTE — Progress Notes (Signed)
CCMD informed this nurse that pt was having lower HR than usual (50's) and abnormal PVCs. Pt stated she was having indigestion that had been ongoing for 2 days. Denies chest, back, shoulder or jaw pain/tightness. Maalox given per orders.

## 2019-04-05 NOTE — Telephone Encounter (Signed)
Aortogram scheduled for Wednesday 04-10-2019. Patient is currently inpatient but may go home prior to Wednesday.

## 2019-04-05 NOTE — Consult Note (Addendum)
Hospital Consult    Reason for Consult:  Non healing wound Requesting Physician:  Margo AyeHall MRN #:  [id number]  History of Present Illness: This is a 63 y.o. female who was admitted to the hospital on 7/7 for SOB and was requiring home O2 around the clock.  She has chronic leg wounds on the right ankle after leg fracture.  When these wounds wouldn't heal, she was sent to the [facility] and then underwent skin grafts earlier this year.  She states they have improved since the skin grafts.  She states that after an echo last week, her toe got stubbed and her nail came off and this is being dressed as well.  VVS is consulted.  She was seen by Dr. Darrick PennaFields in October 2019 and at that time, she did not have any non healing wounds.  Her ABI's at that time were 0.54 (R) and 0.83 (L), which were essentially unchanged from 6 months prior.  She has hx of right ICA stent.    She does have elevated creatinine of 1.63 today, which is up from yesterday, but down from previous days.   She has hx of CABG x 3 by Dr. Laneta SimmersBartle in the past.    The pt is on a statin for cholesterol management.  The pt is on a daily aspirin.   Other AC:  Plavix The pt is on on beta blocker for hypertension.   The pt is diabetic.  On insulin Tobacco hx:  Remote-quit in 2013  Past Medical History:  Diagnosis Date   Asthma    Asthma    CAD (coronary artery disease)    a. NSTEMI 12/12 -> s/p CABGx3.    Carotid artery disease (HCC)    a. Carotid US (08/2013): R 60-79%; L 40-59%; f/u 6 mos.   Carotid stenosis    a. Carotid US (08/2013):  R 60-79%; L 40-59%; f/u 6 mos   Chronic diastolic CHF (congestive heart failure) (HCC)    a. Echo 08/2011: mod LVH, nl EF. b. Echo 01/2013: EF 55-60%, mod-sev LVH, mod-sev LA dilitation   Chronic diastolic heart failure (HCC)    a. Echo 08/2011: mod LVH, nl EF. b. Echo 01/2013: EF 55-60%, mod-sev LVH, mod-sev LA dilitation.   Complication of anesthesia    '' Myself & my family wake up  during surgery "   Complication of anesthesia    Coronary artery disease    a. NSTEMI 12/12 -> s/p CABGx3.   Cough    Cough    a. 2014: Suspect a combination of CHF, GERD and ACE inhibitor-induced cough.   Diabetes mellitus    Diabetes mellitus (HCC)    History of chicken pox    HLD (hyperlipidemia)    HTN (hypertension)    Hyperlipidemia    Hypertension    Morbid obesity (HCC)    Myocardial infarction (HCC)    PONV (postoperative nausea and vomiting)    S/P CABG (coronary artery bypass graft)    a. 2012 - Dr. Laneta SimmersBartle;  LIMA-LAD, SVG-OM 2, SVG-PDA.   S/P CABG x 3 09/07/11   Dr. Laneta SimmersBartle;   LIMA-LAD, SVG-OM 2, SVG-PDA.   Sleep disturbance    a. Had sleep study several years ago, wasn't told she has OSA but told she needs O2 at night.   Sleep disturbance    Had sleep study several years ago, wasn't told she has OSA but told she needs O2 at night   Stroke [facility](HCC)    Stroke [facility](HCC) 2006  denies residual   TIA (transient ischemic attack)     Past Surgical History:  Procedure Laterality Date   CAROTID ANGIOGRAPHY N/A 03/17/2017   Procedure: Carotid Angiography;  Surgeon: Sherren Kerns, MD;  Location: [facility] INVASIVE CV LAB;  Service: Cardiovascular;  Laterality: N/A;   CAROTID PTA/STENT INTERVENTION Right 03/29/2017   Procedure: Carotid PTA/Stent Intervention;  Surgeon: Sherren Kerns, MD;  Location: [facility] INVASIVE CV LAB;  Service: Cardiovascular;  Laterality: Right;   CHOLECYSTECTOMY     CHOLECYSTECTOMY  1990's   CORONARY ARTERY BYPASS GRAFT     CORONARY ARTERY BYPASS GRAFT  09/07/2011   CABG X3; Procedure: CORONARY ARTERY BYPASS GRAFTING (CABG);  Surgeon: Alleen Borne, MD;  Location: [facility] OR;  Service: Open Heart Surgery;  Laterality: N/A;   LEFT HEART CATHETERIZATION WITH CORONARY ANGIOGRAM N/A 09/02/2011   Procedure: LEFT HEART CATHETERIZATION WITH CORONARY ANGIOGRAM;  Surgeon: Herby Abraham, MD;  Location: [facility] CATH LAB;  Service: Cardiovascular;   Laterality: N/A;   TUBAL LIGATION  1981    Allergies  Allergen Reactions   Lisinopril Cough, Nausea And Vomiting and Other (See Comments)    ACE Inhibitor Cough ?Cough - changed to ARB 02/2013. ACE Inhibitor Cough ACE Inhibitor Cough ?Cough - changed to ARB 02/2013. ACE Inhibitor Cough ?Cough - changed to ARB 02/2013.   Other Hives    Unknown IV antibiotic   Tramadol     Causes respiratory distress    Prior to Admission medications   Medication Sig Start Date End Date Taking? Authorizing Provider  albuterol (PROVENTIL HFA;VENTOLIN HFA) 108 (90 BASE) MCG/ACT inhaler Inhale 2 puffs into the lungs every 6 (six) hours as needed for wheezing or shortness of breath. 11/11/14  Yes Dunn, Tacey Ruiz, PA-C  aspirin EC 81 MG tablet Take 1 tablet (81 mg total) by mouth daily. Patient taking differently: Take 325 mg by mouth daily.  07/17/18  Yes Kilroy, Luke K, PA-C  atorvastatin (LIPITOR) 80 MG tablet Take 1 tablet (80 mg total) by mouth daily. 07/17/18  Yes Kilroy, Luke K, PA-C  budesonide-formoterol (SYMBICORT) 160-4.5 MCG/ACT inhaler Inhale 2 puffs into the lungs 2 (two) times daily as needed (SOB).  10/22/18  Yes [provider]  clopidogrel (PLAVIX) 75 MG tablet Take 1 tablet (75 mg total) by mouth daily. 02/01/19  Yes Fields, Janetta Hora, MD  furosemide (LASIX) 40 MG tablet Take 3 tablets (120 mg total) by mouth 2 (two) times daily. 02/04/19  Yes Lewayne Bunting, MD  gabapentin (NEURONTIN) 300 MG capsule Take 600 mg by mouth 3 (three) times daily.    Yes [provider]  insulin regular human CONCENTRATED (HUMULIN R) 500 UNIT/ML injection Inject 10-20 Units into the skin See admin instructions. Inject 20 units in the morning (8AM) and 10-15units at night (10PM) with additional as needed. *Max daily dosage 65units/day 02/20/17  Yes [provider]  Liraglutide 18 MG/3ML SOPN Inject 1.8 mg into the skin daily.   Yes [provider]  metoprolol succinate (TOPROL-XL)  25 MG 24 hr tablet Take 1 tablet (25 mg total) by mouth daily. 07/17/18  Yes Lewayne Bunting, MD  OXYGEN Inhale 2.5 L/day into the lungs continuous.    Yes [provider]  potassium chloride SA (K-DUR,KLOR-CON) 20 MEQ tablet Take 2 tablets (40 mEq total) by mouth daily. 09/07/18  Yes Lewayne Bunting, MD  Vitamin D, Ergocalciferol, (DRISDOL) 1.25 MG (50000 UT) CAPS capsule Take 50,000 Units by mouth every Sunday. 11/09/18  Yes [provider]  collagenase (SANTYL) ointment Apply topically daily. Patient not taking: Reported on 03/26/2019 12/15/18   Lanelle BalHarbrecht, Lawrence, MD  doxycycline (VIBRA-TABS) 100 MG tablet Take 1 tablet (100 mg total) by mouth 2 (two) times daily. Patient not taking: Reported on 01/18/2019 12/15/18   Lanelle BalHarbrecht, Lawrence, MD    Social History   Socioeconomic History   Marital status: Married    Spouse name: Not on file   Number of children: 3   Years of education: 16   Highest education level: Not on file  Occupational History   Occupation: Magazine features editorTeacher    Employer: [employer]  Social Needs   Financial resource strain: Not on file   Food insecurity    Worry: Not on file    Inability: Not on file   Transportation needs    Medical: Not on file    Non-medical: Not on file  Tobacco Use   Smoking status: Former Smoker    Packs/day: 0.50    Years: 25.00    Pack years: 12.50    Types: Cigarettes    Quit date: 11/11/2011    Years since quitting: 7.4   Smokeless tobacco: Former Engineer, waterUser  Substance and Sexual Activity   Alcohol use: No   Drug use: No   Sexual activity: Never  Lifestyle   Physical activity    Days per week: Not on file    Minutes per session: Not on file   Stress: Not on file  Relationships   Social connections    Talks on phone: Not on file    Gets together: Not on file    Attends religious service: Not on file    Active member of club or organization: Not on file    Attends meetings of clubs or  organizations: Not on file    Relationship status: Not on file   Intimate partner violence    Fear of current or ex partner: Not on file    Emotionally abused: Not on file    Physically abused: Not on file    Forced sexual activity: Not on file  Other Topics Concern   Not on file  Social History Narrative   ** Merged History Encounter **       Regular exercise-yes     Family History  Problem Relation Age of Onset   Cancer Mother    Heart failure Mother        Also has hx MVP s/p MVR   Diabetes Mother    Coronary artery disease Father        Died of MI at age 64   Heart disease Other        Parent    ROS: [x]  Positive   [ ]  Negative   [ ]  All sytems reviewed and are negative  Cardiac: [x]  Hx CAD/CABG [x]  SOB []  DOE  Vascular: []  pain in legs while walking []  pain in legs at rest []  pain in legs at night [x]  non-healing ulcers [x]  swelling in legs  Pulmonary: [x]  asthma/wheezing [x]  home O2  Neurologic: [x]  hx of CVA []  mini stroke  Hematologic: []  hx of cancer []  bleeding problems []  problems with blood clotting easily  Endocrine:   [x]  diabetes []  thyroid disease  GI []  vomiting blood []  blood in stool  GU: []  CKD/renal failure []  HD--[]  M/W/F or []  T/T/S  Psychiatric: []  anxiety []  depression  Musculoskeletal: []  arthritis []  joint pain  Integumentary: []  rashes []  ulcers  Constitutional: []  fever []   chills   Physical Examination  Vitals:   04/05/19 0531 04/05/19 0816  BP: 94/62 101/62  Pulse: 68 66  Resp: 18 20  Temp: 98 F (36.7 C) 97.7 F (36.5 C)  SpO2: 100% 100%   Body mass index is 51.36 kg/m.  General:  WDWN in NAD Gait: Not observed HENT: WNL, normocephalic Pulmonary: normal non-labored breathing, without Rales, rhonchi,  wheezing Cardiac: regular Abdomen:  soft, NT/ND, no masses Skin: without rashes Vascular Exam/Pulses: Monophasic doppler signals right DP/PT; bilateral femoral pulses are palpable.    Extremities:Medial and lateral ankle wounds present on the right; right great toe wound present. Musculoskeletal: no muscle wasting or atrophy  Neurologic: A&O X 3;  No focal weakness or paresthesias are detected; speech is fluent/normal Psychiatric:  The pt has Normal affect.  CBC    Component Value Date/Time   WBC 9.3 04/05/2019 0603   RBC 3.13 (L) 04/05/2019 0603   HGB 9.2 (L) 04/05/2019 0603   HCT 28.4 (L) 04/05/2019 0603   PLT 274 04/05/2019 0603   MCV 90.7 04/05/2019 0603   MCH 29.4 04/05/2019 0603   MCHC 32.4 04/05/2019 0603   RDW 14.6 04/05/2019 0603   LYMPHSABS 1.3 12/14/2018 0912   MONOABS 0.5 12/14/2018 0912   EOSABS 0.0 12/14/2018 0912   BASOSABS 0.1 12/14/2018 0912    BMET    Component Value Date/Time   NA 135 04/05/2019 0603   NA 143 02/04/2019 1342   K 3.4 (L) 04/05/2019 0603   CL 87 (L) 04/05/2019 0603   CO2 33 (H) 04/05/2019 0603   GLUCOSE 205 (H) 04/05/2019 0603   BUN 81 (H) 04/05/2019 0603   BUN 19 02/04/2019 1342   CREATININE 1.63 (H) 04/05/2019 0603   CREATININE 1.06 (H) 11/16/2016 1503   CALCIUM 9.3 04/05/2019 0603   GFRNONAA 33 (L) 04/05/2019 0603   GFRNONAA 63 01/27/2015 1621   GFRAA 38 (L) 04/05/2019 0603   GFRAA 72 01/27/2015 1621    COAGS: Lab Results  Component Value Date   INR 1.17 09/07/2011   INR 1.06 09/01/2011     Non-Invasive Vascular Imaging:   Venous duplex BLE 03/27/2019: Right: There is no evidence of deep vein thrombosis in the lower extremity. However, portions of this examination were limited- see technologist comments above. No cystic structure found in the popliteal fossa. Left: There is no evidence of deep vein thrombosis in the lower extremity. However, portions of this examination were limited- see technologist comments above. No cystic structure found in the popliteal fossa.  ABI's 06/28/18:  ABI/TBI Today's ABI Today's TBI Previous ABI Previous  TBI  +-------+-----------+-----------+------------+------------+  Right   0.54        0.23        0.59         0.31          +-------+-----------+-----------+------------+------------+  Left    0.83        0.57        0.91         0.51          +-------+-----------+-----------+------------+------------+  Carotid duplex 06/28/18: Right Carotid: Non-hemodynamically significant plaque <50% noted in the CCA.                Patent right ICA stent with a EDV suggestive of 40-59% distal to                stent.  Right ECA not visualized as noted above.  Left Carotid: Velocities in the left ICA are consistent with a 40-59% stenosis.               Non-hemodynamically significant plaque noted in the CCA. The ECA               appears >50% stenosed.  Vertebrals:  Bilateral vertebral arteries demonstrate antegrade flow. Subclavians: Bilateral subclavian arteries were stenotic. Bilateral subclavian              artery flow was disturbed.  2D Echocardiogram 03/27/2019: IMPRESSIONS  1. The left ventricle has normal systolic function, with an ejection fraction of 55-60%. The cavity size was mildly dilated. There is moderately increased left ventricular wall thickness. Left ventricular diastolic Doppler parameters are consistent with  pseudonormalization. Elevated  2. The right ventricle has normal systolic function. The cavity was normal. There is no increase in right ventricular wall thickness.  3. The mitral valve is grossly normal. There is mild mitral annular calcification present. Trivial MR.  4. The aortic valve was not well visualized.  5. Left atrial size was moderately dilated.   ASSESSMENT/PLAN: This is a 63 y.o. female with hx of right ankle fx s/p surgery and subsequent skin grafting being followed at the wound center and VVS is consulted for non healing wounds.   -pt was seen by Dr. Darrick PennaFields back in October and had decreased ABI of 0.54 on the right at that time, but did  not have any non healing wounds at that time.  She developed wounds after her right ankle fx and is being followed by the wound center with subsequent skin grafts. -pt will need arteriogram next week with Dr. Chestine Sporelark on Wednesday.  Okay for pt to discharge and come back next week.   Doreatha MassedSamantha Rhyne, PA-C [facility] [phone]  I have seen and evaluated the patient. I agree with the PA note as documented above. Patient had right ankle fracture back in February 2020 and non-healing surgical wounds since per her report.  States went to wound care clinic and getting grafting as outpatient - unclear what substitute was used.  Last year ABI's were 0.54 on right with monophasic waveforms - no wounds at that time.  Previously followed by Dr. Darrick PennaFields and underwent right carotid stent procedure..  Would benefit from right leg arteriogram and possible intervention.  Palpable femoral pulses bilatearlly and right DP/PT monophasic.  States wants to go home.  Discussed with Dr. Margo AyeHall and otherwise clear for discharge.  Has right great toenail falling off, right feel wound, lateral ankle wound and shin wounds.  None of this appears infected.  Will arrange for outpatient arteriogram on Wednesday next week with me.  Discussed importance of elevating ankle.  Significant co-morbidities - walks with walker, on home oxygen with diastolic heart failure, CAD s/p CABG, HTN, HLD, DM.    Cephus Shellinghristopher J. Charmian Forbis, MD [facility] [place] Office: [phone] Pager: [phone]

## 2019-04-05 NOTE — Consult Note (Signed)
[place] Nurse wound consult note Patient receiving care in [facility] 3E10. Reason for Consult: While in the unit seeing a different patient, the primary RN for this patient, Barbie, approached me and asked if I was going to see this patient.  She explained that an order had been placed for the [place] nurses yesterday, but no one had responded.  When she and I investigated the order, we discovered it was placed as a "follow up" order and this order type does not populate to the [place] work list.  I then learned from [place] what the [place] consult was supposed to be about.  My [place] colleague, M. Austin placed a note on 03/27/19 at 11:21.  She also placed an order to leave the dressing on the RLE in place due to graft material being placed on the extremity by Dr. Prudy Feeler.  Wound type: surgical  The primary RN, Barbie, reports she removed the outer dressing yesterday.  Barbie was questioning me as to what should be done for the RLE leg and dressing.  I explained to her and the Unit Director, that since this is a surgical wound with graft material, the best option would be to ask the Attending MD Dr. Aileen Fass, to consider reaching out to the surgical services here.  Or, perhaps for Dr. Nevada Crane to reach out to Dr. Prudy Feeler and have a physician to physician consultation about the leg.  I do not have access to the records of Dr. Prudy Feeler or the [facility].  [place] nurse will not follow at this time.  Please re-consult the [place] team if needed.  Val Riles, RN, MSN, CWOCN, CNS-BC, pager [phone]

## 2019-04-05 NOTE — Progress Notes (Addendum)
PROGRESS NOTE  Felicia BeckmannSharon Magee Rivas:[id number]:[id number] DOB: 05/28/1956 DOA: 03/26/2019 PCP: Felicia ChenJohns, [name], MD  HPI/Recap of past 24 hours: Felicia BeckmannSharon Rivas is a 63 y.o. female with medical history significant of CAD sp CABG, asthma, diastolic CHF, DM 2, HTN, HLD, CVA chronic   diastolic  CHF, on 2L of O2 as needed. Presented with  Shortness of breath Gained 25 lb in the past 4 days , patient endorsed some swelling in her lower extremities she is currently on Lasix 20 mg twice a day and states she has been compliant she usually on 2 L as needed but started to use oxygen around-the-clock because she was more short of breath. No recent travel history Patient called her cardiologist who recommended to come to emergency department to be further evaluated  Endorsing that her Lasix did not make her urinate as much as it used to and she has been somewhat constipated Been having more abdominal distention than usual. 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  04/05/19: Patient was seen and examined at her bedside.  She wants to go home.  Right lower extremity wound assessed by wound care specialist with recommendation for surgery consult.  Electrolyte abnormalities noted this morning that are being repleted.  Vascular surgery, Felicia Rivas,  consulted for her chronic right lower extremity wound post skin graft placement.    Assessment/Plan: Principal Problem:   Acute on chronic diastolic heart failure (HCC) Active Problems:   Asthma   DM2 (diabetes mellitus, type 2) (HCC)   Morbid obesity (HCC)   Hypertension   Renal insufficiency   Acute on chronic respiratory failure (HCC)   Constipation   CHF  (congestive heart failure) (HCC)   NSVT (nonsustained ventricular tachycardia) (HCC)   Coronary artery disease involving native coronary artery of native heart without angina pectoris   Hypokalemia   Acute on chronic diastolic CHF Cardiology following, ongoing diuresing Last 2D echo done on 03/27/2019 shows preserved LVEF. Currently net I&O -12.5 L >>-14.5L >> 13.6L since admission [facility] now on 20 mg twice daily Continue medications as recommended by cardiology Continue strict I's and O's and daily weight  Worsening AKI on CKD 3 Creatinine at baseline appears to be 1.44 with GFR 38 Creatinine 1.73 >> 1.82 >> 1.58>> 1.63 Demadex dose reduced from 40 mg twice daily to 20 mg qhs Continue to avoid nephrotoxins Continue to monitor urine output Repeat BMP in the morning  Chronic RLE wound/Stasis ulcer? Per wound care specialist on 03/27/2019:Dressing procedure/placement/frequency: Dressings to be left in placed, only changed at the wound care center due to graft placement. This is a weekly visit, she self reports.  Will update EMR to indicate no dressing changes needed, graft material should not be disrupted.   Wound care specialist re-consulted on 04/03/2019. Recommendation to consult general surgery for wound care specialist Consult page has been sent to Felicia Rivas on 04/05/2019. Not done by Gen surgery, will consult vascular surgery. May need plastic surgery. Vasc surgery Felicia Rivas consulted to assist.  Patient reports has had the right heel stasis wound since Feb 2020 she is [facility] to see about her skin grafts on  anterior right lower extremitity and her right heel chronic wound. She has her big toe wrapped up.   Bilateral lower extremity edema, likely chronic venous stasis Currently on diuretics Demadex 20 mg nightly Edema is persistent  Hypokalemia Potassium 2.8 on 04/07/2019 Improved after repletion to 3.4  Continue to replete as needed Magnesium 2.7 on 04/05/2019.  Acute on chronic hypoxic respiratory failure On 2 L of oxygen as needed at home at baseline Home O2 evaluation prior to DC  Uncontrolled type 2 diabetes with hyperglycemia Last hemoglobin A1c 5.8 Continue insulin coverage Continue to avoid hypoglycaemia  Severe morbid obesity BMI 51 Recommend weight loss outpatient with healthy dieting and regular physical activity  Resolved hypovolemic hyponatremia Sodium 133>>136 C/w fluid restriction  Chronic normocytic anemia Hemoglobin appears to be close to baseline Hemoglobin 10.7 on 03/30/2019 Baseline hemoglobin 11.6 No sign of overt bleeding Monitor H&H  Physical debility/ambulatory dysfunction PT recommended home health PT Fall precaution  History of CAD/CABG -Stable, continue aspirin Plavix beta-blocker and statin, last Myoview 12/201 9 was intermediate risk study  CVA, no residual deficits, continue aspirin Plavix and statin -Stable  Chronic hip and ankle pain -Was taking naproxen for this which is unsafe in the setting of renal insufficiency and CHF -Now on Percocet as needed  Code Status: full Family Communication: patient  Disposition Plan:  Home possibly tomorrow 04/06/2023 when surgery signs off.   Consultants:  Cardiology  Wound care specialist  Vascular surgery Felicia Rivas on 04/05/19.  Procedures:  2D echo    Objective: Vitals:   04/04/19 1925 04/04/19 2042 04/05/19 0531 04/05/19 0816  BP: 135/75  94/62 101/62  Pulse: 70 70 68 66  Resp: 18 (!) 22 18 20   Temp: 98.3 F (36.8 C)  98 F (36.7 C) 97.7 F (36.5 C)  TempSrc: Oral  Oral Oral  SpO2: 99% 98% 100% 100%  Weight:   (!) 144.3 kg   Height:        Intake/Output Summary (Last 24 hours) at 04/05/2019 0925 Last data filed at 04/05/2019 0810 Gross per 24 hour  Intake 1320 ml  Output 801 ml  Net 519 ml   Filed Weights   04/03/19 0437 04/04/19 0442 04/05/19 0531  Weight:  (!) 144.9 kg (!) 145.3 kg (!) 144.3 kg    Exam:  . General: 63 y.o. year-old female morbidly obese in no acute distress.  Alert and oriented x3.   . Cardiovascular: Regular rate and rhythm with no rubs or gallops.  No JVD or thyromegaly noted.   [name]. Respiratory: Clear to auscultation with no wheezes or rales.  Poor inspiratory effort. . Abdomen: Obese nontender nondistended with normal bowel sounds x4. . Musculoskeletal: Bilateral lower extremity edema with hyperpigmentation.  Right lower extremity wrapped in surgical dressing. [name]. Psychiatry: Mood is appropriate for condition and setting.   Data Reviewed: CBC: Recent Labs  Lab 03/30/19 0432 04/05/19 0154 04/05/19 0603  WBC 8.7 10.2 9.3  HGB 10.7* 8.9* 9.2*  HCT 32.9* 27.2* 28.4*  MCV 91.9 90.7 90.7  PLT 224 261 274   Basic Metabolic Panel: Recent Labs  Lab 04/02/19 0748 04/03/19 0353 04/04/19 0446 04/05/19 0154 04/05/19 0603  NA 133* 133* 136 134* 135  K 3.7 3.7 3.5 2.8* 3.4*  CL 89* 87* 88* 87* 87*  CO2 32 32 34* 34* 33*  GLUCOSE 304* 319* 137* 154* 205*  BUN 60* 70* 78* 78* 81*  CREATININE 1.64* 1.73* 1.82* 1.58* 1.63*  CALCIUM 9.5 9.6 9.5 9.2 9.3  MG  2.5*  --   --   --  2.7*   GFR: Estimated Creatinine Clearance: 51.4 mL/min (A) (by C-G formula based on SCr of 1.63 mg/dL (H)). Liver Function Tests: No results for input(s): AST, ALT, ALKPHOS, BILITOT, PROT, ALBUMIN in the last 168 hours. No results for input(s): LIPASE, AMYLASE in the last 168 hours. No results for input(s): AMMONIA in the last 168 hours. Coagulation Profile: No results for input(s): INR, PROTIME in the last 168 hours. Cardiac Enzymes: No results for input(s): CKTOTAL, CKMB, CKMBINDEX, TROPONINI in the last 168 hours. BNP (last 3 results) Recent Labs    07/17/18 1123  PROBNP 399*   HbA1C: No results for input(s): HGBA1C in the last 72 hours. CBG: Recent Labs  Lab 04/04/19 0615 04/04/19 1151 04/04/19 1624 04/04/19 2146 04/05/19 0608   GLUCAP 125* 221* 220* 163* 205*   Lipid Profile: No results for input(s): CHOL, HDL, LDLCALC, TRIG, CHOLHDL, LDLDIRECT in the last 72 hours. Thyroid Function Tests: No results for input(s): TSH, T4TOTAL, FREET4, T3FREE, THYROIDAB in the last 72 hours. Anemia Panel: No results for input(s): VITAMINB12, FOLATE, FERRITIN, TIBC, IRON, RETICCTPCT in the last 72 hours. Urine analysis:    Component Value Date/Time   COLORURINE YELLOW 12/14/2018 2225   APPEARANCEUR HAZY (A) 12/14/2018 2225   LABSPEC 1.013 12/14/2018 2225   PHURINE 5.0 12/14/2018 2225   GLUCOSEU 50 (A) 12/14/2018 2225   HGBUR MODERATE (A) 12/14/2018 2225   BILIRUBINUR NEGATIVE 12/14/2018 2225   KETONESUR NEGATIVE 12/14/2018 2225   PROTEINUR NEGATIVE 12/14/2018 2225   UROBILINOGEN 0.2 11/10/2014 1340   NITRITE NEGATIVE 12/14/2018 2225   LEUKOCYTESUR NEGATIVE 12/14/2018 2225   Sepsis Labs: @LABRCNTIP (procalcitonin:4,lacticidven:4)  ) Recent Results (from the past 240 hour(s))  SARS Coronavirus 2 (CEPHEID - Performed in [facility] lab), Hosp Order     Status: None   Collection Time: 03/26/19  9:04 PM   Specimen: Nasopharyngeal Swab  Result Value Ref Range Status   SARS Coronavirus 2 NEGATIVE NEGATIVE Final    Comment: (NOTE) If result is NEGATIVE SARS-CoV-2 target nucleic acids are NOT DETECTED. The SARS-CoV-2 RNA is generally detectable in upper and lower  respiratory specimens during the acute phase of infection. The lowest  concentration of SARS-CoV-2 viral copies this assay can detect is 250  copies / mL. A negative result does not preclude SARS-CoV-2 infection  and should not be used as the sole basis for treatment or other  patient management decisions.  A negative result may occur with  improper specimen collection / handling, submission of specimen other  than nasopharyngeal swab, presence of viral mutation(s) within the  areas targeted by this assay, and inadequate number of viral copies  (<250  copies / mL). A negative result must be combined with clinical  observations, patient history, and epidemiological information. If result is POSITIVE SARS-CoV-2 target nucleic acids are DETECTED. The SARS-CoV-2 RNA is generally detectable in upper and lower  respiratory specimens dur ing the acute phase of infection.  Positive  results are indicative of active infection with SARS-CoV-2.  Clinical  correlation with patient history and other diagnostic information is  necessary to determine patient infection status.  Positive results do  not rule out bacterial infection or co-infection with other viruses. If result is PRESUMPTIVE POSTIVE SARS-CoV-2 nucleic acids MAY BE PRESENT.   A presumptive positive result was obtained on the submitted specimen  and confirmed on repeat testing.  While 2019 novel coronavirus  (SARS-CoV-2) nucleic acids may be present in  the submitted sample  additional confirmatory testing may be necessary for epidemiological  and / or clinical management purposes  to differentiate between  SARS-CoV-2 and other Sarbecovirus currently known to infect humans.  If clinically indicated additional testing with an alternate test  methodology [phone]) is advised. The SARS-CoV-2 RNA is generally  detectable in upper and lower respiratory sp ecimens during the acute  phase of infection. The expected result is Negative. Fact Sheet for Patients:  StrictlyIdeas.no Fact Sheet for Healthcare Providers: BankingDealers.co.za This test is not yet approved or cleared by the [place] FDA and has been authorized for detection and/or diagnosis of SARS-CoV-2 by FDA under an Emergency Use Authorization (EUA).  This EUA will remain in effect (meaning this test can be used) for the duration of the COVID-19 declaration under Section 564(b)(1) of the Act, 21 U.S.C. section 360bbb-3(b)(1), unless the authorization is terminated or revoked  sooner. Performed at [facility], [place] [address], [place], [place] [address]       Studies: No results found.  Scheduled Meds: . aspirin EC  81 mg Oral Daily  . atorvastatin  80 mg Oral Daily  . clopidogrel  75 mg Oral Daily  . enoxaparin (LOVENOX) injection  70 mg Subcutaneous Daily  . gabapentin  400 mg Oral TID  . insulin aspart  0-20 Units Subcutaneous TID WC  . insulin aspart  0-5 Units Subcutaneous QHS  . insulin aspart  0-5 Units Subcutaneous QHS  . insulin regular human CONCENTRATED  50 Units Subcutaneous QAC supper  . insulin regular human CONCENTRATED  50 Units Subcutaneous BID WC  . metoprolol succinate  50 mg Oral Daily  . mometasone-formoterol  2 puff Inhalation BID  . pantoprazole  40 mg Oral Q1200  . potassium chloride  40 mEq Oral TID  . [START ON 04/06/2019] potassium chloride  40 mEq Oral Daily  . senna  1 tablet Oral BID  . sodium chloride flush  3 mL Intravenous Once  . sodium chloride flush  3 mL Intravenous Q12H  . torsemide  20 mg Oral QPM  . [START ON 04/06/2019] torsemide  40 mg Oral Q breakfast    Continuous Infusions: . sodium chloride Stopped (03/29/19 0907)     LOS: 9 days     Kayleen Memos, MD [facility] Pager [phone]  If 7PM-7AM, please contact night-coverage [email or web address] Password [facility] 04/05/2019, 9:25 AM

## 2019-04-05 NOTE — Discharge Summary (Addendum)
Discharge Summary  Felicia Rivas ZOX:[id number] DOB: 02-18-1956  PCP: [name], MD  Admit date: 03/26/2019 Discharge date: 04/05/2019  Time spent: 35 minutes  Recommendations for Outpatient Follow-up:  1. Follow-up with your cardiologist 2. Follow-up with vascular surgery, Dr. Chestine Spore. Aortogram scheduled for Wednesday 04-10-2019.  3. Follow-up with your primary care provider 4. Take your medications as prescribed 5. Continue physical therapy 6. Fall precautions  Medication recommendations per cardiology: Torsemide  qam and  qpm, Toprol XL  daily, ASA  daily, Plavix  daily, Lipitor  daily.  Potassium dose to be determined at discharge Other recommendations (labs, testing, etc):  BMET in 1 week Follow up as an outpatient:  TOC followup in 1 week with Dr. Jens Som or PA in office  Discharge Diagnoses:  Active Hospital Problems   Diagnosis Date Noted  . Acute on chronic diastolic heart failure (HCC) 03/26/2019  . Hypokalemia   . Coronary artery disease involving native coronary artery of native heart without angina pectoris   . NSVT (nonsustained ventricular tachycardia) (HCC)   . Acute on chronic respiratory failure (HCC) 03/27/2019  . Constipation 03/27/2019  . CHF (congestive heart failure) (HCC) 03/27/2019  . Renal insufficiency 11/10/2014  . Hypertension 11/25/2011  . Morbid obesity (HCC) 09/01/2011  . DM2 (diabetes mellitus, type 2) (HCC) 08/31/2011  . Asthma 08/31/2011    Resolved Hospital Problems  No resolved problems to display.    Discharge Condition: Stable  Diet recommendation: Low-sodium heart healthy carb modified diet.  Vitals:   04/05/19 0816 04/05/19 1159  BP: 101/62 (!) 98/54  Pulse: 66 63  Resp: 20 16  Temp: 97.7 F (36.5 C) 98.5 F (36.9 C)  SpO2: 100% 100%    History of present illness:  Felicia Rivas a 63 y.o.femalewith medical history significant of CAD sp CABG, asthma, diastolic CHF, DM 2, HTN, HLD, CVA  chronic diastolic CHF, on 2L of O2 as needed. Presented with Shortness of breath Gained 25 lb in the past 4 days , patient endorsed some swelling in her lower extremities she is currently on Lasix 20 mg twice a day and states she has been compliant she usually on 2 L as needed but started to use oxygen around-the-clock because she was more short of breath. No recent travel history Patient called her cardiologist who recommended to come to emergency department to be further evaluated  Endorsing that her Lasix did not make her urinate as much as it used to and she has been somewhat constipated Been having more abdominal distention than usual. 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  04/05/19: Patient was seen and examined at her bedside.  She wants to go home.  Right lower extremity wound assessed by wound care specialist with recommendation for surgery consult.  Electrolyte abnormalities noted this morning that are being repleted.  Patient is adamant about going home today.  Vascular surgery, Dr. Chestine Spore,  consulted for her chronic right lower extremity wound post skin graft placement. Has right great toenail falling off, right feel wound, lateral ankle wound and shin wounds.  None of this appears infected per Dr. Chestine Spore.    Vascular surgery will arrange for outpatient arteriogram on Wednesday 04/10/19 next  week.  Discussed importance of elevating ankle.   Patient was seen by Dr. Oneida Alar back in October and had decreased ABI of 0.54 on the right at that time, but did not have any non healing wounds at that time.  She developed wounds after her right ankle fx and is being followed by the  wound center with subsequent skin grafts. -pt will need arteriogram next week with Dr. Carlis Abbott on Wednesday.        Hospital Course:  Principal Problem:   Acute on chronic diastolic heart failure (HCC) Active Problems:   Asthma   DM2 (diabetes mellitus, type 2) (HCC)   Morbid obesity (HCC)   Hypertension   Renal insufficiency   Acute on chronic respiratory failure (HCC)   Constipation   CHF (congestive heart failure) (HCC)   NSVT (nonsustained ventricular tachycardia) (HCC)   Coronary artery disease involving native coronary artery of native heart without angina pectoris   Hypokalemia   Acute on chronic diastolic CHF Cardiology following, ongoing diuresing Last 2D echo done on 03/27/2019 shows preserved LVEF. Currently net I&O -12.5 L >>-14.5L >> 13.6L since admission Switched to [facility] yesterday now on 20 mg twice daily Continue medications as recommended by cardiology Follow-up with cardiology outpatient  Worsening AKI on CKD 3 Creatinine at baseline appears to be 1.44 with GFR 38 Creatinine 1.73 >> 1.82 >> 1.58>> 1.63 Demadex dose reduced from 40 mg twice daily to 20 mg qhs Continue to avoid nephrotoxins Repeat BMP in 1 week Follow-up with your primary care provider  Chronic RLE wound/Stasis ulcer? Per wound care specialist on 03/27/2019:Dressing procedure/placement/frequency: Dressings to be left in placed, only changed at the wound care center due to graft placement. This is a weekly visit, she self reports.  Will update EMR to indicate no dressing changes needed, graft material should not be disrupted.  Recommendation per wound care specialist to consult. Vasc surgery Dr. Carlis Abbott consulted to assist.  Patient reports has had the right heel stasis wound since Feb 2020 she is followed by Dr.Vogler at [place] [facility] to see about her skin grafts on anterior right lower extremitity and her right heel chronic wound. She has her big toe wrapped.  Will  follow-up with Dr. Katheran Awe outpatient as stated above.  Bilateral lower extremity edema, likely secondary to chronic venous stasis Currently on diuretics Demadex 20 mg nightly Edema is persistent  Resolved hypokalemia post repletion Potassium 3.6 on 04/05/2019 post repletion Continue KCl supplement 40 mEq twice daily Follow-up with cardiology Repeat BMP on 04/12/2019.  Acute on chronic hypoxic respiratory failure On 2 L of oxygen as needed at home at baseline Home O2 evaluation prior to DC On home O2 2 L PRN at baseline  Uncontrolled type 2 diabetes with hyperglycemia Last hemoglobin A1c 5.8 Declined inpatient dosing intermittently Resume home diabetic regimen and follow up with your PCP  Severe morbid obesity BMI 51 Recommend weight loss outpatient with healthy dieting and regular physical activity  Resolved hypovolemic hyponatremia Sodium 133>>136 C/w fluid restriction  Chronic normocytic anemia Hemoglobin appears to be close to baseline Hemoglobin 10.7 on 03/30/2019 Baseline hemoglobin 11.6 No sign of overt bleeding  Physical debility/ambulatory dysfunction PT recommended home health PT Fall precaution  History of CAD/CABG -Stable, continue aspirin Plavix beta-blocker and statin, last Myoview 12/201 9 was intermediate risk study  CVA, no residual deficits, continue aspirin Plavix and statin -Stable  Chronic hip and ankle pain -Was taking naproxen for this which is unsafe in the setting of renal  insufficiency and CHF -Now on Percocet as needed  Code Status:full   Consultants:  Cardiology  Wound care specialist  Vascular surgery Dr. Chestine Sporelark on 04/05/19.  Procedures:  2D echo   Discharge Exam: BP (!) 98/54 (BP Location: Left Arm)   Pulse 63   Temp 98.5 F (36.9 C) (Oral)   Resp 16   Ht 5\' 6"  (1.676 m)   Wt (!) 144.3 kg Comment: scale a  SpO2 100%   BMI 51.36 kg/m    General: 63 y.o. year-old female morbidly obese in no acute  distress.  Alert and oriented x3.    Cardiovascular: Regular rate and rhythm with no rubs or gallops.  No JVD or thyromegaly noted.    Respiratory: Clear to auscultation with no wheezes or rales.  Poor inspiratory effort.  Abdomen: Obese nontender nondistended with normal bowel sounds x4.  Musculoskeletal: Bilateral lower extremity edema with hyperpigmentation.  Right lower extremity and right first great toe wrapped in surgical dressing.  Psychiatry: Mood is appropriate for condition and setting.   Discharge Instructions You were cared for by a hospitalist during your hospital stay. If you have any questions about your discharge medications or the care you received while you were in the hospital after you are discharged, you can call the unit and asked to speak with the hospitalist on call if the hospitalist that took care of you is not available. Once you are discharged, your primary care physician will handle any further medical issues. Please note that NO REFILLS for any discharge medications will be authorized once you are discharged, as it is imperative that you return to your primary care physician (or establish a relationship with a primary care physician if you do not have one) for your aftercare needs so that they can reassess your need for medications and monitor your lab values.   Allergies as of 04/05/2019      Reactions   Lisinopril Cough, Nausea And Vomiting, Other (See Comments)   ACE Inhibitor Cough ?Cough - changed to ARB 02/2013. ACE Inhibitor Cough ACE Inhibitor Cough ?Cough - changed to ARB 02/2013. ACE Inhibitor Cough ?Cough - changed to ARB 02/2013.   Other Hives   Unknown IV antibiotic   Tramadol    Causes respiratory distress      Medication List    STOP taking these medications   collagenase ointment Commonly known as: SANTYL   doxycycline 100 MG tablet Commonly known as: VIBRA-TABS   furosemide 40 MG tablet Commonly known as: LASIX     TAKE these  medications   albuterol 108 (90 Base) MCG/ACT inhaler Commonly known as: VENTOLIN HFA Inhale 2 puffs into the lungs every 6 (six) hours as needed for wheezing or shortness of breath.   aspirin EC 81 MG tablet Take 1 tablet (81 mg total) by mouth daily. What changed: how much to take Notes to patient: 04/06/19@8  am   atorvastatin 80 MG tablet Commonly known as: LIPITOR Take 1 tablet (80 mg total) by mouth daily. Notes to patient: Take 04/06/19@8  am   budesonide-formoterol 160-4.5 MCG/ACT inhaler Commonly known as: SYMBICORT Inhale 2 puffs into the lungs 2 (two) times daily as needed (SOB).   clopidogrel 75 MG tablet Commonly known as: Plavix Take 1 tablet (75 mg total) by mouth daily. Notes to patient: 04/06/19@8  am   gabapentin 400 MG capsule Commonly known as: NEURONTIN Take 1 capsule (400 mg total) by mouth 3 (three) times daily. What changed:   medication strength  how much  to take Notes to patient: 04/05/19@2  pm   insulin regular human CONCENTRATED 500 UNIT/ML injection Commonly known as: HUMULIN R Inject 10-20 Units into the skin See admin instructions. Inject 20 units in the morning (8AM) and 10-15units at night (10PM) with additional as needed. *Max daily dosage 65units/day Notes to patient: 04/06/19@8  am   liraglutide 18 MG/3ML Sopn Commonly known as: VICTOZA Inject 1.8 mg into the skin daily. Notes to patient: 04/06/19@8  am   metoprolol succinate 50 MG 24 hr tablet Commonly known as: TOPROL-XL Take 1 tablet (50 mg total) by mouth daily. Take with or immediately following a meal. Start taking on: April 06, 2019 What changed:   medication strength  how much to take  additional instructions Notes to patient: Start 04/06/19@8am    oxyCODONE-acetaminophen 5-325 MG tablet Commonly known as: PERCOCET/ROXICET Take 1 tablet by mouth 2 (two) times daily as needed for severe pain.   OXYGEN Inhale 2.5 L/day into the lungs continuous.   pantoprazole 40 MG  tablet Commonly known as: PROTONIX Take 1 tablet (40 mg total) by mouth daily at 12 noon. Notes to patient: 04/06/19@12  noon   potassium chloride SA 20 MEQ tablet Commonly known as: K-DUR Take 2 tablets (40 mEq total) by mouth 2 (two) times daily. What changed: when to take this   torsemide 20 MG tablet Commonly known as: DEMADEX Take 2 tablets (40 mg total) by mouth daily with breakfast. Please take Torsemide 40 mg qam and 20 mg qpm Start taking on: April 06, 2019   Vitamin D (Ergocalciferol) 1.25 MG (50000 UT) Caps capsule Commonly known as: DRISDOL Take 50,000 Units by mouth every Sunday. Notes to patient: Take every Sunday!!!            Durable Medical Equipment  (From admission, onward)         Start     Ordered   04/05/19 0443  For home use only DME 3 n 1  Once     07 /17/20 0442         Allergies  Allergen Reactions  . Lisinopril Cough, Nausea And Vomiting and Other (See Comments)    ACE Inhibitor Cough ?Cough - changed to ARB 02/2013. ACE Inhibitor Cough ACE Inhibitor Cough ?Cough - changed to ARB 02/2013. ACE Inhibitor Cough ?Cough - changed to ARB 02/2013.  [name]. Other Hives    Unknown IV antibiotic  . Tramadol     Causes respiratory distress   Follow-up Information    [name], [name], MD. Call in 1 day.   Specialty: Internal Medicine Why: Please call for a post hospital follow-up appointment. Contact information: [address] [place] [address] [phone]        Cephus Shellinglark, Christopher J, MD. Call in 1 day(s).   Specialty: Vascular Surgery Why: Aortogram scheduled for Wednesday 04-10-2019.  Contact information: [address] [place] [address] [phone]        Beatriz StallionKroeger, Krista M., PA-C Follow up on 04/11/2019.   Specialty: Physician Assistant Why: Please arrive 15 minutes early for your 9:00am post-hospital cardiology follow-up appointment.  Contact information: [address] [place] [address] [phone]             The results of significant diagnostics from this hospitalization (including imaging, microbiology, ancillary and laboratory) are listed below for reference.    Significant Diagnostic Studies: Dg Chest 2 View  Result Date: 03/26/2019 CLINICAL DATA:  Shortness of breath EXAM: CHEST - 2 VIEW COMPARISON:  11/11/2014 FINDINGS: Shallow lung inflation with mild  cardiomegaly. Remote median sternotomy. No focal airspace consolidation or pulmonary edema. No pleural effusion or pneumothorax. IMPRESSION: No active cardiopulmonary disease. Electronically Signed   By: Deatra Robinson M.D.   On: 03/26/2019 17:22   Dg Abd 1 View  Result Date: 03/26/2019 CLINICAL DATA:  Chronic constipation. EXAM: ABDOMEN - 1 VIEW COMPARISON:  None. FINDINGS: The bowel gas pattern is normal. No radio-opaque calculi or other significant radiographic abnormality are seen. There is a large stool burden. There are degenerative changes throughout the lumbar spine and bilateral hips. IMPRESSION: Large stool burden. Electronically Signed   By: Katherine Mantle M.D.   On: 03/26/2019 22:54   Vas [place] Lower Extremity Venous (dvt)  Result Date: 03/27/2019  Lower Venous Study Indications: Edema.  Risk Factors: None identified. Limitations: Body habitus, poor ultrasound/tissue interface and patient pain tolerance. Comparison Study: No prior studies. Performing Technologist: Chanda Busing RVT  Examination Guidelines: A complete evaluation includes B-mode imaging, spectral Doppler, color Doppler, and power Doppler as needed of all accessible portions of each vessel. Bilateral testing is considered an integral part of a complete examination. Limited examinations for reoccurring indications may be performed as noted.  +---------+---------------+---------+-----------+----------+--------------+ RIGHT    CompressibilityPhasicitySpontaneityPropertiesSummary         +---------+---------------+---------+-----------+----------+--------------+ CFV      Full           Yes      Yes                                 +---------+---------------+---------+-----------+----------+--------------+ SFJ      Full                                                        +---------+---------------+---------+-----------+----------+--------------+ FV Prox  Full                                                        +---------+---------------+---------+-----------+----------+--------------+ FV Mid   Full                                                        +---------+---------------+---------+-----------+----------+--------------+ FV Distal               Yes      Yes                                 +---------+---------------+---------+-----------+----------+--------------+ PFV      Full                                                        +---------+---------------+---------+-----------+----------+--------------+ POP      Full           Yes      Yes                                 +---------+---------------+---------+-----------+----------+--------------+  PTV      Full                                                        +---------+---------------+---------+-----------+----------+--------------+ PERO                                                  Not visualized +---------+---------------+---------+-----------+----------+--------------+   +---------+---------------+---------+-----------+----------+--------------+ LEFT     CompressibilityPhasicitySpontaneityPropertiesSummary        +---------+---------------+---------+-----------+----------+--------------+ CFV      Full           Yes      Yes                                 +---------+---------------+---------+-----------+----------+--------------+ SFJ      Full                                                         +---------+---------------+---------+-----------+----------+--------------+ FV Prox  Full                                                        +---------+---------------+---------+-----------+----------+--------------+ FV Mid   Full           Yes      Yes                                 +---------+---------------+---------+-----------+----------+--------------+ FV Distal                                             Not visualized +---------+---------------+---------+-----------+----------+--------------+ PFV      Full                                                        +---------+---------------+---------+-----------+----------+--------------+ POP      Full           Yes      Yes                                 +---------+---------------+---------+-----------+----------+--------------+ PTV      Full                                                        +---------+---------------+---------+-----------+----------+--------------+  PERO                                                  Not visualized +---------+---------------+---------+-----------+----------+--------------+     Summary: Right: There is no evidence of deep vein thrombosis in the lower extremity. However, portions of this examination were limited- see technologist comments above. No cystic structure found in the popliteal fossa. Left: There is no evidence of deep vein thrombosis in the lower extremity. However, portions of this examination were limited- see technologist comments above. No cystic structure found in the popliteal fossa.  *See table(s) above for measurements and observations. Electronically signed by Gretta Began MD on 03/27/2019 at 3:17:22 PM.    Final     Microbiology: Recent Results (from the past 240 hour(s))  SARS Coronavirus 2 (CEPHEID - Performed in [facility] hospital lab), Hosp Order     Status: None   Collection Time: 03/26/19  9:04 PM   Specimen: Nasopharyngeal Swab  Result  Value Ref Range Status   SARS Coronavirus 2 NEGATIVE NEGATIVE Final    Comment: (NOTE) If result is NEGATIVE SARS-CoV-2 target nucleic acids are NOT DETECTED. The SARS-CoV-2 RNA is generally detectable in upper and lower  respiratory specimens during the acute phase of infection. The lowest  concentration of SARS-CoV-2 viral copies this assay can detect is 250  copies / mL. A negative result does not preclude SARS-CoV-2 infection  and should not be used as the sole basis for treatment or other  patient management decisions.  A negative result may occur with  improper specimen collection / handling, submission of specimen other  than nasopharyngeal swab, presence of viral mutation(s) within the  areas targeted by this assay, and inadequate number of viral copies  (<250 copies / mL). A negative result must be combined with clinical  observations, patient history, and epidemiological information. If result is POSITIVE SARS-CoV-2 target nucleic acids are DETECTED. The SARS-CoV-2 RNA is generally detectable in upper and lower  respiratory specimens dur ing the acute phase of infection.  Positive  results are indicative of active infection with SARS-CoV-2.  Clinical  correlation with patient history and other diagnostic information is  necessary to determine patient infection status.  Positive results do  not rule out bacterial infection or co-infection with other viruses. If result is PRESUMPTIVE POSTIVE SARS-CoV-2 nucleic acids MAY BE PRESENT.   A presumptive positive result was obtained on the submitted specimen  and confirmed on repeat testing.  While 2019 novel coronavirus  (SARS-CoV-2) nucleic acids may be present in the submitted sample  additional confirmatory testing may be necessary for epidemiological  and / or clinical management purposes  to differentiate between  SARS-CoV-2 and other Sarbecovirus currently known to infect humans.  If clinically indicated additional testing  with an alternate test  methodology [phone]) is advised. The SARS-CoV-2 RNA is generally  detectable in upper and lower respiratory sp ecimens during the acute  phase of infection. The expected result is Negative. Fact Sheet for Patients:  BoilerBrush.com.cy Fact Sheet for Healthcare Providers: https://pope.com/ This test is not yet approved or cleared by the [place] FDA and has been authorized for detection and/or diagnosis of SARS-CoV-2 by FDA under an Emergency Use Authorization (EUA).  This EUA will remain in effect (meaning this test can be used) for the duration of the COVID-19 declaration under Section  564(b)(1) of the Act, 21 U.S.C. section 360bbb-3(b)(1), unless the authorization is terminated or revoked sooner. Performed at [facility], [address] [place], [place] [address]      Labs: Basic Metabolic Panel: Recent Labs  Lab 04/02/19 [phone] 04/03/19 0353 04/04/19 0446 04/05/19 0154 04/05/19 0603 04/05/19 1147  NA 133* 133* 136 134* 135 133*  K 3.7 3.7 3.5 2.8* 3.4* 3.6  CL 89* 87* 88* 87* 87* 86*  CO2 32 32 34* 34* 33* 32  GLUCOSE 304* 319* 137* 154* 205* 346*  BUN 60* 70* 78* 78* 81* 79*  CREATININE 1.64* 1.73* 1.82* 1.58* 1.63* 1.62*  CALCIUM 9.5 9.6 9.5 9.2 9.3 9.2  MG 2.5*  --   --   --  2.7*  --    Liver Function Tests: No results for input(s): AST, ALT, ALKPHOS, BILITOT, PROT, ALBUMIN in the last 168 hours. No results for input(s): LIPASE, AMYLASE in the last 168 hours. No results for input(s): AMMONIA in the last 168 hours. CBC: Recent Labs  Lab 03/30/19 0432 04/05/19 0154 04/05/19 0603  WBC 8.7 10.2 9.3  HGB 10.7* 8.9* 9.2*  HCT 32.9* 27.2* 28.4*  MCV 91.9 90.7 90.7  PLT 224 261 274   Cardiac Enzymes: No results for input(s): CKTOTAL, CKMB, CKMBINDEX, TROPONINI in the last 168 hours. BNP: BNP (last 3 results) Recent Labs    03/26/19 1826  BNP 85.4    ProBNP (last 3  results) Recent Labs    07/17/18 1123  PROBNP 399*    CBG: Recent Labs  Lab 04/04/19 0615 04/04/19 1151 04/04/19 1624 04/04/19 2146 04/05/19 0608  GLUCAP 125* 221* 220* 163* 205*       Signed:  Darlin Droparole N Hall, MD [facility] 04/05/2019, 2:27 PM

## 2019-04-05 NOTE — Discharge Instructions (Signed)
Heart Failure, Diagnosis  Heart failure means that your heart is not able to pump blood in the right way. This makes it hard for your body to work well. Heart failure is usually a long-term (chronic) condition. You must take good care of yourself and follow your treatment plan from your doctor. What are the causes? This condition may be caused by:  High blood pressure.  Build up of cholesterol and fat in the arteries.  Heart attack. This injures the heart muscle.  Heart valves that do not open and close properly.  Damage of the heart muscle. This is also called cardiomyopathy.  Lung disease.  Abnormal heart rhythms. What increases the risk? The risk of heart failure goes up as a person ages. This condition is also more likely to develop in people who:  Are overweight.  Are female.  Smoke or chew tobacco.  Abuse alcohol or illegal drugs.  Have taken medicines that can damage the heart.  Have diabetes.  Have abnormal heart rhythms.  Have thyroid problems.  Have low blood counts (anemia). What are the signs or symptoms? Symptoms of this condition include:  Shortness of breath.  Coughing.  Swelling of the feet, ankles, legs, or belly.  Losing weight for no reason.  Trouble breathing.  Waking from sleep because of the need to sit up and get more air.  Rapid heartbeat.  Being very tired.  Feeling dizzy, or feeling like you may pass out (faint).  Having no desire to eat.  Feeling like you may vomit (nauseous).  Peeing (urinating) more at night.  Feeling confused. How is this treated?     This condition may be treated with:  Medicines. These can be given to treat blood pressure and to make the heart muscles stronger.  Changes in your daily life. These may include eating a healthy diet, staying at a healthy body weight, quitting tobacco and illegal drug use, or doing exercises.  Surgery. Surgery can be done to open blocked valves, or to put devices in  the heart, such as pacemakers.  A donor heart (heart transplant). You will receive a healthy heart from a donor. Follow these instructions at home:  Treat other conditions as told by your doctor. These may include high blood pressure, diabetes, thyroid disease, or abnormal heart rhythms.  Learn as much as you can about heart failure.  Get support as you need it.  Keep all follow-up visits as told by your doctor. This is important. Summary  Heart failure means that your heart is not able to pump blood in the right way.  This condition is caused by high blood pressure, heart attack, or damage of the heart muscle.  Symptoms of this condition include shortness of breath and swelling of the feet, ankles, legs, or belly. You may also feel very tired or feel like you may vomit.  You may be treated with medicines, surgery, or changes in your daily life.  Treat other health conditions as told by your doctor. This information is not intended to replace advice given to you by your health care provider. Make sure you discuss any questions you have with your health care provider. Document Released: 06/14/2008 Document Revised: 11/23/2018 Document Reviewed: 11/23/2018 Elsevier Patient Education  2020 [employer]   Heart Failure Action Plan A heart failure action plan helps you understand what to do when you have symptoms of heart failure. Follow the plan that was created by you and your health care provider. Review your plan each  time you visit your health care provider. Red zone These signs and symptoms mean you should get medical help right away:  You have trouble breathing when resting.  You have a dry cough that is getting worse.  You have swelling or pain in your legs or abdomen that is getting worse.  You suddenly gain more than 2-3 lb (0.9-1.4 kg) in a day, or more than 5 lb (2.3 kg) in one week. This amount may be more or less depending on your condition.  You have trouble  staying awake or you feel confused.  You have chest pain.  You do not have an appetite.  You pass out. If you experience any of these symptoms:  Call your local emergency services (911 in the [place] right away or seek help at the emergency department of the nearest hospital. Yellow zone These signs and symptoms mean your condition may be getting worse and you should make some changes:  You have trouble breathing when you are active or you need to sleep with extra pillows.  You have swelling in your legs or abdomen.  You gain 2-3 lb (0.9-1.4 kg) in one day, or 5 lb (2.3 kg) in one week. This amount may be more or less depending on your condition.  You get tired easily.  You have trouble sleeping.  You have a dry cough. If you experience any of these symptoms:  Contact your health care provider within the next day.  Your health care provider may adjust your medicines. Green zone These signs mean you are doing well and can continue what you are doing:  You do not have shortness of breath.  You have very little swelling or no new swelling.  Your weight is stable (no gain or loss).  You have a normal activity level.  You do not have chest pain or any other new symptoms. Follow these instructions at home:  Take over-the-counter and prescription medicines only as told by your health care provider.  Weigh yourself daily. Your target weight is __________ lb (__________ kg). ? Call your health care provider if you gain more than __________ lb (__________ kg) in a day, or more than __________ lb (__________ kg) in one week.  Eat a heart-healthy diet. Work with a diet and nutrition specialist (dietitian) to create an eating plan that is best for you.  Keep all follow-up visits as told by your health care provider. This is important. Where to find more information  American Heart Association: www.heart.org Summary  Follow the action plan that was created by you and your  health care provider.  Get help right away if you have any symptoms in the Red zone. This information is not intended to replace advice given to you by your health care provider. Make sure you discuss any questions you have with your health care provider. Document Released: 10/15/2016 Document Revised: 08/18/2017 Document Reviewed: 10/15/2016 Elsevier Patient Education  2020 [employer]   Heart Failure Exacerbation  Heart failure is a condition in which the heart does not fill up with enough blood, and therefore does not pump enough blood and oxygen to the body. When this happens, parts of the body do not get the blood and oxygen they need to function properly. This can cause symptoms such as breathing problems, fatigue, swelling, and confusion. Heart failure exacerbation refers to heart failure symptoms that get worse. The symptoms may get worse suddenly or develop slowly over time. Heart failure exacerbation is a serious medical problem  that should be treated right away. What are the causes? A heart failure exacerbation can be triggered by:  Not taking your heart failure medicines correctly.  Infections.  Eating an unhealthy diet or a diet that is high in salt (sodium).  Drinking too much fluid.  Drinking alcohol.  Taking illegal drugs, such as cocaine or methamphetamine.  Not exercising. Other causes include:  Other heart conditions such as an irregular heartbeat (arrhythmia).  Anemia.  Other medical problems, such as kidney failure. Sometimes the cause of the exacerbation is not known. What are the signs or symptoms? When heart failure symptoms suddenly or slowly get worse, this may be a sign of heart failure exacerbation. Symptoms of heart failure include:  Breathing problems or shortness of breath.  Chronic coughing or wheezing.  Fatigue.  Nausea or lack of appetite.  Feeling light-headed.  Confusion or memory loss.  Increased heart rate or irregular  heartbeat.  Buildup of fluid in the legs, ankles, feet, or abdomen.  Difficulty breathing when lying down. How is this diagnosed? This condition is diagnosed based on:  Your symptoms and medical history.  A physical exam. You may also have tests, including:  Electrocardiogram (ECG). This test measures the electrical activity of your heart.  Echocardiogram. This test uses sound waves to take a picture of your heart to see how well it works.  Blood tests.  Imaging tests, such as: ? Chest X-ray. ? MRI. ? Ultrasound.  Stress test. This test examines how well your heart functions when you exercise. Your heart is monitored while you exercise on a treadmill or exercise bike. If you cannot exercise, medicines may be used to increase your heartbeat in place of exercise.  Cardiac catheterization. During this test, a thin, flexible tube (catheter) is inserted into a blood vessel and threaded up to your heart. This test allows your health care provider to check the arteries that lead to your heart (coronary arteries).  Right heart catheterization. During this test, the pressure in your heart is measured. How is this treated? This condition may be treated by:  Adjusting your heart medicines.  Maintaining a healthy lifestyle. This includes: ? Eating a heart-healthy diet that is low in sodium. ? Not using any products that contain nicotine or tobacco, such as cigarettes and e-cigarettes. ? Regular exercise. ? Monitoring your fluid intake. ? Monitoring your weight and reporting changes to your health care provider.  Treating sleep apnea, if you have this condition.  Surgery. This may include: ? Implanting a device that helps both sides of your heart contract at the same time (cardiac resynchronization therapy device). This can help with heart function and relieve heart failure symptoms. ? Implanting a device that can correct heart rhythm problems (implantable cardioverter  defibrillator). ? Connecting a device to your heart to help it pump blood (ventricular assist device). ? Heart transplant. Follow these instructions at home: Medicines  Take over-the-counter and prescription medicines only as told by your health care provider.  Do not stop taking your medicines or change the amount you take. If you are having problems or side effects from your medicines, talk to your health care provider.  If you are having difficulty paying for your medicines, contact a social worker or your clinic. There are many programs to assist with medicine costs.  Talk to your health care provider before starting any new medicines or supplements.  Make sure your health care provider and pharmacist have a list of all the medicines you are taking.  Eating and drinking   Avoid drinking alcohol.  Eat a heart-healthy diet as told by your health care provider. This includes: ? Plenty of fruits and vegetables. ? Lean proteins. ? Low-fat dairy. ? Whole grains. ? Foods that are low in sodium. Activity   Exercise regularly as told by your health care provider. Balance exercise with rest.  Ask your health care provider what activities are safe for you. This includes sexual activity, exercise, and daily tasks at home or work. Lifestyle  Do not use any products that contain nicotine or tobacco, such as cigarettes and e-cigarettes. If you need help quitting, ask your health care provider.  Maintain a healthy weight. Ask your health care provider what weight is healthy for you.  Consider joining a patient support group. This can help with emotional problems you may have, such as stress and anxiety. General instructions  Talk to your health care provider about flu and pneumonia vaccines.  Keep a list of medicines that you are taking. This may help in emergency situations.  Keep all follow-up visits as told by your health care provider. This is important. Contact a health care  provider if:  You have questions about your medicines or you miss a dose.  You feel anxious, depressed, or stressed.  You have swelling in your feet, ankles, legs, or abdomen.  You have shortness of breath during activity or exercise.  You have a cough.  You have a fever.  You have trouble sleeping.  You gain 2-3 lb (1-1.4 kg) in 24 hours or 5 lb (2.3 kg) in a week. Get help right away if:  You have chest pain.  You have shortness of breath while resting.  You have severe fatigue.  You are confused.  You have severe dizziness.  You have a rapid or irregular heartbeat.  You have nausea or you vomit.  You have a cough that is worse at night or you cannot lie flat.  You have a cough that will not go away.  You have severe depression or sadness. Summary  When heart failure symptoms get worse, it is called heart failure exacerbation.  Common causes of this condition include taking medicines incorrectly, infections, and drinking alcohol.  This condition may be treated by adjusting medicines, maintaining a healthy lifestyle, or surgery.  Do not stop taking your medicines or change the amount you take. If you are having problems or side effects from your medicines, talk to your health care provider. This information is not intended to replace advice given to you by your health care provider. Make sure you discuss any questions you have with your health care provider. Document Released: 01/17/2017 Document Revised: 08/18/2017 Document Reviewed: 01/17/2017 Elsevier Patient Education  2020 [employer] Inc[employer]   Heart Failure, Self Care Heart failure is a serious condition. This sheet explains things you need to do to take care of yourself at home. To help you stay as healthy as possible, you may be asked to change your diet, take certain medicines, and make other changes in your life. Your doctor may also give you more specific instructions. If you have problems or questions,  call your doctor. What are the risks? Having heart failure makes it more likely for you to have some problems. These problems can get worse if you do not take good care of yourself. Problems may include:  Blood clotting problems. This may cause a stroke.  Damage to the kidneys, liver, or lungs.  Abnormal heart rhythms. Supplies needed:  Scale for weighing yourself.  Blood pressure monitor.  Notebook.  Medicines. How to care for yourself when you have heart failure Medicines Take over-the-counter and prescription medicines only as told by your doctor. Take your medicines every day.  Do not stop taking your medicine unless your doctor tells you to do so.  Do not skip any medicines.  Get your prescriptions refilled before you run out of medicine. This is important. Eating and drinking   Eat heart-healthy foods. Talk with a diet specialist (dietitian) to create an eating plan.  Choose foods that: ? Have no trans fat. ? Are low in saturated fat and cholesterol.  Choose healthy foods, such as: ? Fresh or frozen fruits and vegetables. ? Fish. ? Low-fat (lean) meats. ? Legumes, such as beans, peas, and lentils. ? Fat-free or low-fat dairy products. ? Whole-grain foods. ? High-fiber foods.  Limit salt (sodium) if told by your doctor. Ask your diet specialist to tell you which seasonings are healthy for your heart.  Cook in healthy ways instead of frying. Healthy ways of cooking include roasting, grilling, broiling, baking, poaching, steaming, and stir-frying.  Limit how much fluid you drink, if told by your doctor. Alcohol use  Do not drink alcohol if: ? Your doctor tells you not to drink. ? Your heart was damaged by alcohol, or you have very bad heart failure. ? You are pregnant, may be pregnant, or are planning to become pregnant.  If you drink alcohol: ? Limit how much you use to:  0-1 drink a day for women.  0-2 drinks a day for men. ? Be aware of how much  alcohol is in your drink. In the [place] one drink equals one 12 oz bottle of beer (355 mL), one 5 oz glass of wine (148 mL), or one 1 oz glass of hard liquor (44 mL). Lifestyle   Do not use any products that contain nicotine or tobacco, such as cigarettes, e-cigarettes, and chewing tobacco. If you need help quitting, ask your doctor. ? Do not use nicotine gum or patches before talking to your doctor.  Do not use illegal drugs.  Lose weight if told by your doctor.  Do physical activity if told by your doctor. Talk to your doctor before you begin an exercise if: ? You are an older adult. ? You have very bad heart failure.  Learn to manage stress. If you need help, ask your doctor.  Get rehab (rehabilitation) to help you stay independent and to help with your quality of life.  Plan time to rest when you get tired. Check weight and blood pressure   Weigh yourself every day. This will help you to know if fluid is building up in your body. ? Weigh yourself every morning after you pee (urinate) and before you eat breakfast. ? Wear the same amount of clothing each time. ? Write down your daily weight. Give your record to your doctor.  Check and write down your blood pressure as told by your doctor.  Check your pulse as told by your doctor. Dealing with very hot and very cold weather  If it is very hot: ? Avoid activities that take a lot of energy. ? Use air conditioning or fans, or find a cooler place. ? Avoid caffeine and alcohol. ? Wear clothing that is loose-fitting, lightweight, and light-colored.  If it is very cold: ? Avoid activities that take a lot of energy. ? Layer your clothes. ? Wear mittens or gloves, a hat, and  a scarf when you go outside. ? Avoid alcohol. Follow these instructions at home:  Stay up to date with shots (vaccines). Get pneumococcal and flu (influenza) shots.  Keep all follow-up visits as told by your doctor. This is important. Contact a doctor  if:  You gain weight quickly.  You have increasing shortness of breath.  You cannot do your normal activities.  You get tired easily.  You cough a lot.  You don't feel like eating or feel like you may vomit (nauseous).  You become puffy (swell) in your hands, feet, ankles, or belly (abdomen).  You cannot sleep well because it is hard to breathe.  You feel like your heart is beating fast (palpitations).  You get dizzy when you stand up. Get help right away if:  You have trouble breathing.  You or someone else notices a change in your behavior, such as having trouble staying awake.  You have chest pain or discomfort.  You pass out (faint). These symptoms may be an emergency. Do not wait to see if the symptoms will go away. Get medical help right away. Call your local emergency services (911 in the [place] Do not drive yourself to the hospital. Summary  Heart failure is a serious condition. To care for yourself, you may have to change your diet, take medicines, and make other lifestyle changes.  Take your medicines every day. Do not stop taking them unless your doctor tells you to do so.  Eat heart-healthy foods, such as fresh or frozen fruits and vegetables, fish, lean meats, legumes, fat-free or low-fat dairy products, and whole-grain or high-fiber foods.  Ask your doctor if you can drink alcohol. You may have to stop alcohol use if you have very bad heart failure.  Contact your doctor if you gain weight quickly or feel that your heart is beating too fast. Get help right away if you pass out, or have chest pain or trouble breathing. This information is not intended to replace advice given to you by your health care provider. Make sure you discuss any questions you have with your health care provider. Document Released: 12/19/2018 Document Revised: 12/18/2018 Document Reviewed: 12/19/2018 Elsevier Patient Education  2020 [employer] Inc[employer]   Heart Failure Eating Plan Heart  failure, also called congestive heart failure, occurs when your heart does not pump blood well enough to meet your body's needs for oxygen-rich blood. Heart failure is a long-term (chronic) condition. Living with heart failure can be challenging. However, following your health care provider's instructions about a healthy lifestyle and working with a diet and nutrition specialist (dietitian) to choose the right foods may help to improve your symptoms. What are tips for following this plan? Reading food labels  Check food labels for the amount of sodium per serving. Choose foods that have less than 140 mg (milligrams) of sodium in each serving.  Check food labels for the number of calories per serving. This is important if you need to limit your daily calorie intake to lose weight.  Check food labels for the serving size. If you eat more than one serving, you will be eating more sodium and calories than what is listed on the label.  Look for foods that are labeled as "sodium-free," "very low sodium," or "low sodium." ? Foods labeled as "reduced sodium" or "lightly salted" may still have more sodium than what is recommended for you. Cooking  Avoid adding salt when cooking. Ask your health care provider or dietitian before using salt substitutes.  Season food with salt-free seasonings, spices, or herbs. Check the label of seasoning mixes to make sure they do not contain salt.  Cook with heart-healthy oils, such as olive, canola, soybean, or sunflower oil.  Do not fry foods. Cook foods using low-fat methods, such as baking, boiling, grilling, and broiling.  Limit unhealthy fats when cooking by: ? Removing the skin from poultry, such as chicken. ? Removing all visible fats from meats. ? Skimming the fat off from stews, soups, and gravies before serving them. Meal planning   Limit your intake of: ? Processed, canned, or pre-packaged foods. ? Foods that are high in trans fat, such as fried  foods. ? Sweets, desserts, sugary drinks, and other foods with added sugar. ? Full-fat dairy products, such as whole milk.  Eat a balanced diet that includes: ? 4-5 servings of fruit each day and 4-5 servings of vegetables each day. At each meal, try to fill half of your plate with fruits and vegetables. ? Up to 6-8 servings of whole grains each day. ? Up to 2 servings of lean meat, poultry, or fish each day. One serving of meat is equal to 3 oz. This is about the same size as a deck of cards. ? 2 servings of low-fat dairy each day. ? Heart-healthy fats. Healthy fats called omega-3 fatty acids are found in foods such as flaxseed and cold-water fish like sardines, salmon, and mackerel.  Aim to eat 25-35 g (grams) of fiber a day. Foods that are high in fiber include apples, broccoli, carrots, beans, peas, and whole grains.  Do not add salt or condiments that contain salt (such as soy sauce) to foods before eating.  When eating at a restaurant, ask that your food be prepared with less salt or no salt, if possible.  Try to eat 2 or more vegetarian meals each week.  Eat more home-cooked food and eat less restaurant, buffet, and fast food. General information  Do not eat more than 2,300 mg of salt (sodium) a day. The amount of sodium that is recommended for you may be lower, depending on your condition.  Maintain a healthy body weight as directed. Ask your health care provider what a healthy weight is for you. ? Check your weight every day. ? Work with your health care provider and dietitian to make a plan that is right for you to lose weight or maintain your current weight.  Limit how much fluid you drink. Ask your health care provider or dietitian how much fluid you can have each day.  Limit or avoid alcohol as told by your health care provider or dietitian. Recommended foods The items listed may not be a complete list. Talk with your dietitian about what dietary choices are best for  you. Fruits All fresh, frozen, and canned fruits. Dried fruits, such as raisins, prunes, and cranberries. Vegetables All fresh vegetables. Vegetables that are frozen without sauce or added salt. Low-sodium or sodium-free canned vegetables. Grains Bread with less than 80 mg of sodium per slice. Whole-wheat pasta, quinoa, and brown rice. Oats and oatmeal. Barley. Millet. Grits and cream of wheat. Whole-grain and whole-wheat cold cereal. Meats and other protein foods Lean cuts of meat. Skinless chicken and [place]. Fish with high omega-3 fatty acids, such as salmon, sardines, and other cold-water fishes. Eggs. Dried beans, peas, and edamame. Unsalted nuts and nut butters. Dairy Low-fat or nonfat (skim) milk and dried milk. Rice milk, soy milk, and almond milk. Low-fat or nonfat yogurt. Small amounts  of reduced-sodium block cheese. Low-sodium cottage cheese. Fats and oils Olive, canola, soybean, flaxseed, or sunflower oil. Avocado. Sweets and desserts Apple sauce. Granola bars. Sugar-free pudding and gelatin. Frozen fruit bars. Seasoning and other foods Fresh and dried herbs. Lemon or lime juice. Vinegar. Low-sodium ketchup. Salt-free marinades, salad dressings, sauces, and seasonings. The items listed above may not be a complete list of foods and beverages you can eat. Contact a dietitian for more information. Foods to avoid The items listed may not be a complete list. Talk with your dietitian about what dietary choices are best for you. Fruits Fruits that are dried with sodium-containing preservatives. Vegetables Canned vegetables. Frozen vegetables with sauce or seasonings. Creamed vegetables. [place] fries. Onion rings. Pickled vegetables and sauerkraut. Grains Bread with more than 80 mg of sodium per slice. Hot or cold cereal with more than 140 mg sodium per serving. Salted pretzels and crackers. Pre-packaged breadcrumbs. Bagels, croissants, and biscuits. Meats and other protein foods Ribs  and chicken wings. Bacon, ham, pepperoni, bologna, salami, and packaged luncheon meats. Hot dogs, bratwurst, and sausage. Canned meat. Smoked meat and fish. Salted nuts and seeds. Dairy Whole milk, half-and-half, and cream. Buttermilk. Processed cheese, cheese spreads, and cheese curds. Regular cottage cheese. Feta cheese. Shredded cheese. String cheese. Fats and oils Butter, lard, shortening, ghee, and bacon fat. Canned and packaged gravies. Seasoning and other foods Onion salt, garlic salt, table salt, and sea salt. Marinades. Regular salad dressings. Relishes, pickles, and olives. Meat flavorings and tenderizers, and bouillon cubes. Horseradish, ketchup, and mustard. Worcestershire sauce. Teriyaki sauce, soy sauce (including reduced sodium). Hot sauce and Tabasco sauce. Steak sauce, fish sauce, oyster sauce, and cocktail sauce. Taco seasonings. Barbecue sauce. Tartar sauce. The items listed above may not be a complete list of foods and beverages you should avoid. Contact a dietitian for more information. Summary  A heart failure eating plan includes changes that limit your intake of sodium and unhealthy fat, and it may help you lose weight or maintain a healthy weight. Your health care provider may also recommend limiting how much fluid you drink.  Most people with heart failure should eat no more than 2,300 mg of salt (sodium) a day. The amount of sodium that is recommended for you may be lower, depending on your condition.  Contact your health care provider or dietitian before making any major changes to your diet. This information is not intended to replace advice given to you by your health care provider. Make sure you discuss any questions you have with your health care provider. Document Released: 01/20/2017 Document Revised: 11/01/2018 Document Reviewed: 01/20/2017 Elsevier Patient Education  2020 [place] With Heart Failure  Heart failure is a long-term (chronic)  condition in which the heart cannot pump enough blood through the body. When this happens, parts of the body do not get the blood and oxygen they need. There is no cure for heart failure at this time, so it is important for you to take good care of yourself and follow the treatment plan set by your health care provider. If you are living with heart failure, there are ways to help you manage the disease. Follow these instructions at home: Living with heart failure requires you to make changes in your life. Your health care team will teach you about the changes you need to make in order to relieve your symptoms and lower your risk of going to the hospital. Follow the treatment plan as set by your health  care provider. Medicines Medicines are important in reducing your heart's workload, slowing the progression of heart failure, and improving your symptoms.  Take over-the-counter and prescription medicines only as told by your health care provider.  Do not stop taking your medicine unless your health care provider tells you to do that.  Do not skip any dose of your medicine.  Refill prescriptions before you run out of medicine. You need your medicines every day. Eating and drinking   Eat heart-healthy foods. Talk with a dietitian to make an eating plan that is right for you. ? If directed by your health care provider: ? Limit salt (sodium). Lowering your sodium intake may reduce symptoms of heart failure. Ask a dietitian to recommend heart-healthy seasonings. ? Limit your fluid intake. Fluid restriction may reduce symptoms of heart failure. ? Use low-fat cooking methods instead of frying. Low-fat methods include roasting, grilling, broiling, baking, poaching, steaming, and stir-frying. ? Choose foods that contain no trans fat and are low in saturated fat and cholesterol. Healthy choices include fresh or frozen fruits and vegetables, fish, lean meats, legumes, fat-free or low-fat dairy products, and  whole-grain or high-fiber foods.  Limit alcohol intake to no more than 1 drink a day for nonpregnant women and 2 drinks a day for men. One drink equals 12 oz of beer, 5 oz of wine, or 1 oz of hard liquor. ? Drinking more than that is harmful to your heart. Tell your health care provider if you drink alcohol several times a week. ? Talk with your health care provider about whether any level of alcohol use is safe for you. Activity   Ask your health care provider about attending cardiac rehabilitation. These programs include aerobic physical activity, which provides many benefits for your heart.  If no cardiac rehabilitation program is available, ask your health care provider what aerobic exercises are safe for you to do. Lifestyle Make the lifestyle changes recommended by your health care provider. In general:  Lose weight if your health care provider tells you to do that. Weight loss may reduce symptoms of heart failure.  Do not use any products that contain nicotine or tobacco, such as cigarettes or e-cigarettes. If you need help quitting, ask your health care provider.  Do not use street (illegal) drugs.  Return to your normal activities as told by your health care provider. Ask your health care provider what activities are safe for you. General instructions   Make sure you weigh yourself every day to track your weight. Rapid weight gain may indicate an increase in fluid in your body and may increase the workload of your heart. ? Weigh yourself every morning. Do this after you urinate but before you eat breakfast. ? Wear the same type of clothing, without shoes, each time you weigh yourself. ? Weigh yourself on the same scale and in the same spot each time.  Living with chronic heart failure often leads to emotions such as fear, stress, anxiety, and depression. If you feel any of these emotions and need help coping, contact your health care provider. Other ways to get help  include: ? Talking to friends and family members about your condition. They can give you support and guidance. Explain your symptoms to them and, if comfortable, invite them to attend appointments or rehabilitation with you. ? Joining a support group for people with chronic heart failure. Talking with other people who have the same symptoms may give you new ways of coping with your disease and  your emotions.  Stay up to date with your shots (vaccines). Staying current on pneumococcal and influenza vaccines is especially important in preventing germs from attacking your airways (respiratory infections).  Keep all follow-up visits as told by your health care provider. This is important. How to recognize changes in your condition You and your family members need to know what changes to watch for in your condition. Watch for the following changes and report them to your health care provider:  Sudden weight gain. Ask your health care provider what amount of weight gain to report.  Shortness of breath: ? Feeling short of breath while at rest, with no exercise or activity that required great effort. ? Feeling breathless with activity.  Swelling of your lower legs or ankles.  Difficulty sleeping: ? You wake up feeling short of breath. ? You have to use more pillows to raise your head in order to sleep.  Frequent, dry, hacking cough.  Loss of appetite.  Feeling more tired all the time.  Depression or feelings of sadness or hopelessness.  Bloating in the stomach. Where to find more information  Local support groups. Ask your health care provider about groups near you.  The American Heart Association: www.heart.org Contact a health care provider if:  You have a rapid weight gain.  You have increasing shortness of breath that is unusual for you.  You are unable to participate in your usual physical activities.  You tire easily.  You cough more than normal, especially with physical  activity.  You have any swelling or more swelling in areas such as your hands, feet, ankles, or abdomen.  You feel like your heart is beating quickly (palpitations).  You become dizzy or light-headed when you stand up. Get help right away if:  You have difficulty breathing.  You notice or your family notices a change in your awareness, such as having trouble staying awake or having difficulty with concentration.  You have pain or discomfort in your chest.  You have an episode of fainting (syncope). Summary  There is no cure for heart failure, so it is important for you to take good care of yourself and follow the treatment plan set by your health care provider.  Medicines are important in reducing your heart's workload, slowing the progression of heart failure, and improving your symptoms.  Living with chronic heart failure often leads to emotions such as fear, stress, anxiety, and depression. If you are feeling any of these emotions and need help coping, contact your health care provider. This information is not intended to replace advice given to you by your health care provider. Make sure you discuss any questions you have with your health care provider. Document Released: 01/18/2017 Document Revised: 08/18/2017 Document Reviewed: 01/18/2017 Elsevier Patient Education  2020 [employer].

## 2019-04-08 NOTE — Telephone Encounter (Signed)
Patient contacted regarding discharge from [facility] on 04/05/2019.  Patient understands to follow up with provider Roby Lofts, PA on 04/11/2019 at 9:00 AM at [facility]. Patient understands discharge instructions? Yes Patient understands medications and regiment? Yes Patient understands to bring all medications to this visit? Yes  Patient aware to come alone and wear a mask.

## 2019-04-10 ENCOUNTER — Ambulatory Visit (HOSPITAL_COMMUNITY)
Admission: RE | Admit: 2019-04-10 | Discharge: 2019-04-10 | Disposition: A | Discharge status: Home / Self Care | Payer: BC Managed Care – PPO | Attending: Vascular Surgery | Admitting: Vascular Surgery

## 2019-04-10 ENCOUNTER — Other Ambulatory Visit: Payer: Self-pay

## 2019-04-10 ENCOUNTER — Encounter (HOSPITAL_COMMUNITY)
Admission: RE | Disposition: A | Discharge status: Home / Self Care | Payer: Self-pay | Source: Home / Self Care | Attending: Vascular Surgery

## 2019-04-10 ENCOUNTER — Telehealth: Payer: Self-pay | Admitting: Medical

## 2019-04-10 ENCOUNTER — Other Ambulatory Visit (HOSPITAL_COMMUNITY): Admission: RE | Admit: 2019-04-10 | Payer: BC Managed Care – PPO | Source: Ambulatory Visit

## 2019-04-10 DIAGNOSIS — Z87891 Personal history of nicotine dependence: Secondary | ICD-10-CM | POA: Insufficient documentation

## 2019-04-10 DIAGNOSIS — Z833 Family history of diabetes mellitus: Secondary | ICD-10-CM | POA: Insufficient documentation

## 2019-04-10 DIAGNOSIS — Z885 Allergy status to narcotic agent status: Secondary | ICD-10-CM | POA: Insufficient documentation

## 2019-04-10 DIAGNOSIS — Z8249 Family history of ischemic heart disease and other diseases of the circulatory system: Secondary | ICD-10-CM | POA: Insufficient documentation

## 2019-04-10 DIAGNOSIS — G4733 Obstructive sleep apnea (adult) (pediatric): Secondary | ICD-10-CM | POA: Diagnosis not present

## 2019-04-10 DIAGNOSIS — Z7982 Long term (current) use of aspirin: Secondary | ICD-10-CM | POA: Insufficient documentation

## 2019-04-10 DIAGNOSIS — Z888 Allergy status to other drugs, medicaments and biological substances status: Secondary | ICD-10-CM | POA: Diagnosis not present

## 2019-04-10 DIAGNOSIS — Z951 Presence of aortocoronary bypass graft: Secondary | ICD-10-CM | POA: Diagnosis not present

## 2019-04-10 DIAGNOSIS — I252 Old myocardial infarction: Secondary | ICD-10-CM | POA: Insufficient documentation

## 2019-04-10 DIAGNOSIS — I6523 Occlusion and stenosis of bilateral carotid arteries: Secondary | ICD-10-CM | POA: Insufficient documentation

## 2019-04-10 DIAGNOSIS — I739 Peripheral vascular disease, unspecified: Secondary | ICD-10-CM | POA: Diagnosis not present

## 2019-04-10 DIAGNOSIS — I5032 Chronic diastolic (congestive) heart failure: Secondary | ICD-10-CM | POA: Diagnosis not present

## 2019-04-10 DIAGNOSIS — E11621 Type 2 diabetes mellitus with foot ulcer: Secondary | ICD-10-CM | POA: Insufficient documentation

## 2019-04-10 DIAGNOSIS — Z79899 Other long term (current) drug therapy: Secondary | ICD-10-CM | POA: Diagnosis not present

## 2019-04-10 DIAGNOSIS — I110 Hypertensive heart disease with heart failure: Secondary | ICD-10-CM | POA: Insufficient documentation

## 2019-04-10 DIAGNOSIS — I2510 Atherosclerotic heart disease of native coronary artery without angina pectoris: Secondary | ICD-10-CM | POA: Diagnosis not present

## 2019-04-10 DIAGNOSIS — Z7951 Long term (current) use of inhaled steroids: Secondary | ICD-10-CM | POA: Diagnosis not present

## 2019-04-10 DIAGNOSIS — Z9851 Tubal ligation status: Secondary | ICD-10-CM | POA: Insufficient documentation

## 2019-04-10 DIAGNOSIS — Z1159 Encounter for screening for other viral diseases: Secondary | ICD-10-CM | POA: Insufficient documentation

## 2019-04-10 DIAGNOSIS — Z9981 Dependence on supplemental oxygen: Secondary | ICD-10-CM | POA: Diagnosis not present

## 2019-04-10 DIAGNOSIS — I70233 Atherosclerosis of native arteries of right leg with ulceration of ankle: Secondary | ICD-10-CM | POA: Diagnosis not present

## 2019-04-10 DIAGNOSIS — Z7902 Long term (current) use of antithrombotics/antiplatelets: Secondary | ICD-10-CM | POA: Insufficient documentation

## 2019-04-10 DIAGNOSIS — J45909 Unspecified asthma, uncomplicated: Secondary | ICD-10-CM | POA: Insufficient documentation

## 2019-04-10 DIAGNOSIS — E785 Hyperlipidemia, unspecified: Secondary | ICD-10-CM | POA: Diagnosis not present

## 2019-04-10 DIAGNOSIS — L97319 Non-pressure chronic ulcer of right ankle with unspecified severity: Secondary | ICD-10-CM | POA: Insufficient documentation

## 2019-04-10 DIAGNOSIS — Z8673 Personal history of transient ischemic attack (TIA), and cerebral infarction without residual deficits: Secondary | ICD-10-CM | POA: Insufficient documentation

## 2019-04-10 DIAGNOSIS — Z6841 Body Mass Index (BMI) 40.0 and over, adult: Secondary | ICD-10-CM | POA: Insufficient documentation

## 2019-04-10 DIAGNOSIS — Z794 Long term (current) use of insulin: Secondary | ICD-10-CM | POA: Diagnosis not present

## 2019-04-10 DIAGNOSIS — Z95828 Presence of other vascular implants and grafts: Secondary | ICD-10-CM | POA: Insufficient documentation

## 2019-04-10 HISTORY — PX: PERIPHERAL VASCULAR INTERVENTION: CATH118257

## 2019-04-10 HISTORY — PX: ABDOMINAL AORTOGRAM W/LOWER EXTREMITY: CATH118223

## 2019-04-10 LAB — GLUCOSE, CAPILLARY
Glucose-Capillary: 133 mg/dL — ABNORMAL HIGH (ref 70–99)
Glucose-Capillary: 34 mg/dL — CL (ref 70–99)
Glucose-Capillary: 86 mg/dL (ref 70–99)
Glucose-Capillary: 53 mg/dL — ABNORMAL LOW (ref 70–99)
Glucose-Capillary: 132 mg/dL — ABNORMAL HIGH (ref 70–99)
Glucose-Capillary: 70 mg/dL (ref 70–99)
Glucose-Capillary: 78 mg/dL (ref 70–99)

## 2019-04-10 LAB — POCT I-STAT, CHEM 8
BUN: 29 mg/dL — ABNORMAL HIGH (ref 8–23)
Calcium, Ion: 1 mmol/L — ABNORMAL LOW (ref 1.15–1.40)
Chloride: 102 mmol/L (ref 98–111)
Creatinine, Ser: 1.1 mg/dL — ABNORMAL HIGH (ref 0.44–1.00)
Glucose, Bld: 210 mg/dL — ABNORMAL HIGH (ref 70–99)
HCT: 27 % — ABNORMAL LOW (ref 36.0–46.0)
Hemoglobin: 9.2 g/dL — ABNORMAL LOW (ref 12.0–15.0)
Potassium: 4.4 mmol/L (ref 3.5–5.1)
Sodium: 137 mmol/L (ref 135–145)
TCO2: 29 mmol/L (ref 22–32)

## 2019-04-10 LAB — SARS CORONAVIRUS 2 BY RT PCR (HOSPITAL ORDER, PERFORMED IN ~~LOC~~ HOSPITAL LAB): SARS Coronavirus 2: NEGATIVE

## 2019-04-10 LAB — POCT ACTIVATED CLOTTING TIME
Activated Clotting Time: 263 seconds
Activated Clotting Time: 246 seconds

## 2019-04-10 SURGERY — ABDOMINAL AORTOGRAM W/LOWER EXTREMITY
Anesthesia: LOCAL | Laterality: Right

## 2019-04-10 MED ORDER — SODIUM CHLORIDE 0.9 % IV SOLN
INTRAVENOUS | Status: DC
  Administered 2019-04-10: 12:00:00 via INTRAVENOUS

## 2019-04-10 MED ORDER — GABAPENTIN 400 MG PO CAPS
400.0000 mg | ORAL_CAPSULE | Freq: Once | ORAL | Status: DC
  Filled 2019-04-10: qty 1

## 2019-04-10 MED ORDER — PROTAMINE SULFATE 10 MG/ML IV SOLN
INTRAVENOUS | Status: AC
  Filled 2019-04-10: qty 5

## 2019-04-10 MED ORDER — MIDAZOLAM HCL 2 MG/2ML IJ SOLN
INTRAMUSCULAR | Status: AC
  Filled 2019-04-10: qty 2

## 2019-04-10 MED ORDER — HEPARIN SODIUM (PORCINE) 1000 UNIT/ML IJ SOLN
INTRAMUSCULAR | Status: DC | PRN
  Administered 2019-04-10: 2000 [IU] via INTRAVENOUS
  Administered 2019-04-10: 12000 [IU] via INTRAVENOUS

## 2019-04-10 MED ORDER — SODIUM CHLORIDE 0.9% FLUSH: 3.0000 mL | Freq: Two times a day (BID) | INTRAVENOUS | Status: DC

## 2019-04-10 MED ORDER — SODIUM CHLORIDE 0.9 % WEIGHT BASED INFUSION: 1.0000 mL/kg/h | INTRAVENOUS | Status: DC

## 2019-04-10 MED ORDER — DEXTROSE 50 % IV SOLN
INTRAVENOUS | Status: DC | PRN
  Administered 2019-04-10 (×2): 25 mL via INTRAVENOUS

## 2019-04-10 MED ORDER — HYDRALAZINE HCL 20 MG/ML IJ SOLN: 5.0000 mg | INTRAMUSCULAR | Status: DC | PRN

## 2019-04-10 MED ORDER — LIDOCAINE HCL (PF) 1 % IJ SOLN
INTRAMUSCULAR | Status: DC | PRN
  Administered 2019-04-10: 25 mL

## 2019-04-10 MED ORDER — HEPARIN (PORCINE) IN NACL 1000-0.9 UT/500ML-% IV SOLN
INTRAVENOUS | Status: DC | PRN
  Administered 2019-04-10 (×2): 500 mL

## 2019-04-10 MED ORDER — HEPARIN (PORCINE) IN NACL 1000-0.9 UT/500ML-% IV SOLN
INTRAVENOUS | Status: AC
  Filled 2019-04-10: qty 500

## 2019-04-10 MED ORDER — DEXTROSE 50 % IV SOLN
INTRAVENOUS | Status: AC
  Filled 2019-04-10: qty 50

## 2019-04-10 MED ORDER — ACETAMINOPHEN 325 MG PO TABS: 650.0000 mg | ORAL_TABLET | ORAL | Status: DC | PRN

## 2019-04-10 MED ORDER — FENTANYL CITRATE (PF) 100 MCG/2ML IJ SOLN
INTRAMUSCULAR | Status: AC
  Filled 2019-04-10: qty 2

## 2019-04-10 MED ORDER — LABETALOL HCL 5 MG/ML IV SOLN: 10.0000 mg | INTRAVENOUS | Status: DC | PRN

## 2019-04-10 MED ORDER — ONDANSETRON HCL 4 MG/2ML IJ SOLN: 4.0000 mg | Freq: Four times a day (QID) | INTRAMUSCULAR | Status: DC | PRN

## 2019-04-10 MED ORDER — FENTANYL CITRATE (PF) 100 MCG/2ML IJ SOLN
INTRAMUSCULAR | Status: DC | PRN
  Administered 2019-04-10: 25 ug via INTRAVENOUS

## 2019-04-10 MED ORDER — MIDAZOLAM HCL 2 MG/2ML IJ SOLN
INTRAMUSCULAR | Status: DC | PRN
  Administered 2019-04-10: 1 mg via INTRAVENOUS

## 2019-04-10 MED ORDER — SODIUM CHLORIDE 0.9% FLUSH: 3.0000 mL | INTRAVENOUS | Status: DC | PRN

## 2019-04-10 MED ORDER — HEPARIN SODIUM (PORCINE) 1000 UNIT/ML IJ SOLN
INTRAMUSCULAR | Status: AC
  Filled 2019-04-10: qty 2

## 2019-04-10 MED ORDER — PROTAMINE SULFATE 10 MG/ML IV SOLN
INTRAVENOUS | Status: DC | PRN
  Administered 2019-04-10: 40 mg via INTRAVENOUS
  Administered 2019-04-10: 5 mg via INTRAVENOUS

## 2019-04-10 MED ORDER — IODIXANOL 320 MG/ML IV SOLN
INTRAVENOUS | Status: DC | PRN
  Administered 2019-04-10: 210 mL via INTRA_ARTERIAL

## 2019-04-10 MED ORDER — SODIUM CHLORIDE 0.9 % IV SOLN: 250.0000 mL | INTRAVENOUS | Status: DC | PRN

## 2019-04-10 MED ORDER — LIDOCAINE HCL (PF) 1 % IJ SOLN
INTRAMUSCULAR | Status: AC
  Filled 2019-04-10: qty 30

## 2019-04-10 SURGICAL SUPPLY — 28 items
BALLN STERLING OTW 5X220X150 (BALLOONS) ×3
BALLOON STERLING OTW 5X220X150 (BALLOONS) IMPLANT
CATH CXI 2.6F 65 ST (CATHETERS) ×1
CATH CXI SUPP 2.6F 150 ST (CATHETERS) ×1 IMPLANT
CATH OMNI FLUSH 5F 65CM (CATHETERS) ×1 IMPLANT
CATH QUICKCROSS .018X135CM (MICROCATHETER) ×1 IMPLANT
CATH QUICKCROSS SUPP .035X90CM (MICROCATHETER) ×1 IMPLANT
CATH SPRT STRG 65X2.6FR ACPT (CATHETERS) IMPLANT
CLOSURE MYNX CONTROL 6F/7F (Vascular Products) IMPLANT
COVER DOME SNAP 22 D (MISCELLANEOUS) ×1 IMPLANT
DEVICE ONE SNARE 10MM (MISCELLANEOUS) ×1 IMPLANT
GLIDEWIRE ADV .035X180CM (WIRE) ×1 IMPLANT
KIT ENCORE 26 ADVANTAGE (KITS) ×1 IMPLANT
KIT MICROPUNCTURE NIT STIFF (SHEATH) ×1 IMPLANT
KIT PV (KITS) ×3 IMPLANT
PATCH THROMBIX TOPICAL PLAIN (HEMOSTASIS) ×1 IMPLANT
SHEATH FLEX ANSEL ANG 6F 45CM (SHEATH) ×1 IMPLANT
SHEATH MICROPUNCTURE PEDAL 4FR (SHEATH) ×1 IMPLANT
SHEATH PINNACLE 5F 10CM (SHEATH) ×1 IMPLANT
SHEATH PINNACLE 6F 10CM (SHEATH) ×1 IMPLANT
SHEATH PROBE COVER 6X72 (BAG) ×2 IMPLANT
STENT VIABAHN 5X150X120 (Permanent Stent) ×1 IMPLANT
STENT VIABAHN 5X250X120 (Permanent Stent) ×1 IMPLANT
SYR MEDRAD MARK V 150ML (SYRINGE) ×1 IMPLANT
TRANSDUCER W/STOPCOCK (MISCELLANEOUS) ×3 IMPLANT
TRAY PV CATH (CUSTOM PROCEDURE TRAY) ×3 IMPLANT
WIRE BENTSON .035X145CM (WIRE) ×1 IMPLANT
WIRE G V18X300CM (WIRE) ×2 IMPLANT

## 2019-04-10 NOTE — H&P (Signed)
History and Physical Interval Note:  04/10/2019 1:23 PM  Felicia Rivas  has presented today for surgery, with the diagnosis of PVD ulcer.  The various methods of treatment have been discussed with the patient and family. After consideration of risks, benefits and other options for treatment, the patient has consented to  Procedure(s): ABDOMINAL AORTOGRAM W/LOWER EXTREMITY (Bilateral) as a surgical intervention.  The patient's history has been reviewed, patient examined, no change in status, stable for surgery.  I have reviewed the patient's chart and labs.  Questions were answered to the patient's satisfaction.    CLI with non-healing wounds right lower extremity.  [place]    Reason for Consult:  Non healing wound Requesting Physician:  Nevada Crane MRN #:  [id number]  History of Present Illness: This is a 63 y.o. female who was admitted to the hospital on 7/7 for SOB and was requiring home O2 around the clock.  She has chronic leg wounds on the right ankle after leg fracture.  When these wounds wouldn't heal, she was sent to the [facility] and then underwent skin grafts earlier this year.  She states they have improved since the skin grafts.  She states that after an echo last week, her toe got stubbed and her nail came off and this is being dressed as well.  [facility] is consulted.  She was seen by Dr. Oneida Alar in October 2019 and at that time, she did not have any non healing wounds.  Her ABI's at that time were 0.54 (R) and 0.83 (L), which were essentially unchanged from 6 months prior.  She has hx of right ICA stent.    She does have elevated creatinine of 1.63 today, which is up from yesterday, but down from previous days.   She has hx of CABG x 3 by Dr. Cyndia Bent in the past.    The pt is on a statin for cholesterol management.  The pt is on a daily aspirin.   Other AC:  Plavix The pt is on on beta blocker for hypertension.   The pt is diabetic.  On  insulin Tobacco hx:  Remote-quit in 2013      Past Medical History:  Diagnosis Date  . Asthma   . Asthma   . CAD (coronary artery disease)    a. NSTEMI 12/12 -> s/p CABGx3.   . Carotid artery disease ([place])    a. Carotid US (08/2013): R 60-79%; L 40-59%; f/u 6 mos.  . Carotid stenosis    a. Carotid US (08/2013):  R 60-79%; L 40-59%; f/u 6 mos  . Chronic diastolic CHF (congestive heart failure) ([place])    a. Echo 08/2011: mod LVH, nl EF. b. Echo 01/2013: EF 55-60%, mod-sev LVH, mod-sev LA dilitation  . Chronic diastolic heart failure ([place])    a. Echo 08/2011: mod LVH, nl EF. b. Echo 01/2013: EF 55-60%, mod-sev LVH, mod-sev LA dilitation.  . Complication of anesthesia    '' Myself & my family wake up during surgery "  . Complication of anesthesia   . Coronary artery disease    a. NSTEMI 12/12 -> s/p CABGx3.  [name] Cough   . Cough    a. 2014: Suspect a combination of CHF, GERD and ACE inhibitor-induced cough.  . Diabetes mellitus   . Diabetes mellitus ([place])   . History of chicken pox   . HLD (hyperlipidemia)   . HTN (hypertension)   . Hyperlipidemia   . Hypertension   .  Morbid obesity (HCC)   . Myocardial infarction (HCC)   . PONV (postoperative nausea and vomiting)   . S/P CABG (coronary artery bypass graft)    a. 2012 - Dr. Laneta SimmersBartle;  LIMA-LAD, SVG-OM 2, SVG-PDA.  . S/P CABG x 3 09/07/11   Dr. Laneta SimmersBartle;   LIMA-LAD, SVG-OM 2, SVG-PDA.  . Sleep disturbance    a. Had sleep study several years ago, wasn't told she has OSA but told she needs O2 at night.  . Sleep disturbance    Had sleep study several years ago, wasn't told she has OSA but told she needs O2 at night  . Stroke (HCC)   . Stroke [facility](HCC) 2006   denies residual  . TIA (transient ischemic attack)          Past Surgical History:  Procedure Laterality Date  . CAROTID ANGIOGRAPHY N/A 03/17/2017   Procedure: Carotid Angiography;  Surgeon: Sherren KernsFields, Charles E, MD;  [facility];  Service: Cardiovascular;  Laterality: N/A;  . CAROTID PTA/STENT INTERVENTION Right 03/29/2017   Procedure: Carotid PTA/Stent Intervention;  Surgeon: Sherren KernsFields, Charles E, MD;  Location: [facility] INVASIVE CV LAB;  Service: Cardiovascular;  Laterality: Right;  . CHOLECYSTECTOMY    . CHOLECYSTECTOMY  1990's  . CORONARY ARTERY BYPASS GRAFT    . CORONARY ARTERY BYPASS GRAFT  09/07/2011   CABG X3; Procedure: CORONARY ARTERY BYPASS GRAFTING (CABG);  Surgeon: Alleen BorneBryan K Bartle, MD;  [facility];  Service: Open Heart Surgery;  Laterality: N/A;  . LEFT HEART CATHETERIZATION WITH CORONARY ANGIOGRAM N/A 09/02/2011   Procedure: LEFT HEART CATHETERIZATION WITH CORONARY ANGIOGRAM;  Surgeon: Herby Abrahamhomas D Stuckey, MD;  [facility];  Service: Cardiovascular;  Laterality: N/A;  . TUBAL LIGATION  1981         Allergies  Allergen Reactions  . Lisinopril Cough, Nausea And Vomiting and Other (See Comments)    ACE Inhibitor Cough ?Cough - changed to ARB 02/2013. ACE Inhibitor Cough ACE Inhibitor Cough ?Cough - changed to ARB 02/2013. ACE Inhibitor Cough ?Cough - changed to ARB 02/2013.  [name]. Other Hives    Unknown IV antibiotic  . Tramadol     Causes respiratory distress           Prior to Admission medications   Medication Sig Start Date End Date Taking? Authorizing Provider  albuterol (PROVENTIL HFA;VENTOLIN HFA) 108 (90 BASE) MCG/ACT inhaler Inhale 2 puffs into the lungs every 6 (six) hours as needed for wheezing or shortness of breath. 11/11/14  Yes Dunn, Tacey Ruizayna N, PA-C  aspirin EC 81 MG tablet Take 1 tablet (81 mg total) by mouth daily. Patient taking differently: Take 325 mg by mouth daily.  07/17/18  Yes Kilroy, Luke K, PA-C  atorvastatin (LIPITOR) 80 MG tablet Take 1 tablet (80 mg total) by mouth daily. 07/17/18  Yes Kilroy, Luke K, PA-C  budesonide-formoterol (SYMBICORT) 160-4.5 MCG/ACT inhaler Inhale 2 puffs into the lungs 2 (two) times daily as needed (SOB).  10/22/18  Yes  [provider]  clopidogrel (PLAVIX) 75 MG tablet Take 1 tablet (75 mg total) by mouth daily. 02/01/19  Yes Fields, Janetta Horaharles E, MD  furosemide (LASIX) 40 MG tablet Take 3 tablets (120 mg total) by mouth 2 (two) times daily. 02/04/19  Yes Lewayne Buntingrenshaw, Brian S, MD  gabapentin (NEURONTIN) 300 MG capsule Take 600 mg by mouth 3 (three) times daily.    Yes [provider]  insulin regular human CONCENTRATED (HUMULIN R) 500 UNIT/ML injection Inject 10-20 Units into  the skin See admin instructions. Inject 20 units in the morning (8AM) and 10-15units at night (10PM) with additional as needed. *Max daily dosage 65units/day 02/20/17  Yes [provider]  Liraglutide 18 MG/3ML SOPN Inject 1.8 mg into the skin daily.   Yes [provider]  metoprolol succinate (TOPROL-XL) 25 MG 24 hr tablet Take 1 tablet (25 mg total) by mouth daily. 07/17/18  Yes Lewayne Buntingrenshaw, Brian S, MD  OXYGEN Inhale 2.5 L/day into the lungs continuous.    Yes [provider]  potassium chloride SA (K-DUR,KLOR-CON) 20 MEQ tablet Take 2 tablets (40 mEq total) by mouth daily. 09/07/18  Yes Lewayne Buntingrenshaw, Brian S, MD  Vitamin D, Ergocalciferol, (DRISDOL) 1.25 MG (50000 UT) CAPS capsule Take 50,000 Units by mouth every Sunday. 11/09/18  Yes [provider]  collagenase (SANTYL) ointment Apply topically daily. Patient not taking: Reported on 03/26/2019 12/15/18   Lanelle BalHarbrecht, Lawrence, MD  doxycycline (VIBRA-TABS) 100 MG tablet Take 1 tablet (100 mg total) by mouth 2 (two) times daily. Patient not taking: Reported on 01/18/2019 12/15/18   Lanelle BalHarbrecht, Lawrence, MD    Social History        Socioeconomic History  . Marital status: Married    Spouse name: Not on file  . Number of children: 3  . Years of education: 5216  . Highest education level: Not on file  Occupational History  . Occupation: Magazine features editorTeacher    Employer: [employer]  Social Needs  . Financial resource strain: Not on  file  . Food insecurity    Worry: Not on file    Inability: Not on file  . Transportation needs    Medical: Not on file    Non-medical: Not on file  Tobacco Use  . Smoking status: Former Smoker    Packs/day: 0.50    Years: 25.00    Pack years: 12.50    Types: Cigarettes    Quit date: 11/11/2011    Years since quitting: 7.4  . Smokeless tobacco: Former Engineer, waterUser  Substance and Sexual Activity  . Alcohol use: No  . Drug use: No  . Sexual activity: Never  Lifestyle  . Physical activity    Days per week: Not on file    Minutes per session: Not on file  . Stress: Not on file  Relationships  . Social Musicianconnections    Talks on phone: Not on file    Gets together: Not on file    Attends religious service: Not on file    Active member of club or organization: Not on file    Attends meetings of clubs or organizations: Not on file    Relationship status: Not on file  . Intimate partner violence    Fear of current or ex partner: Not on file    Emotionally abused: Not on file    Physically abused: Not on file    Forced sexual activity: Not on file  Other Topics Concern  . Not on file  Social History Narrative   ** Merged History Encounter **       Regular exercise-yes     Family History  Problem Relation Age of Onset  . Cancer Mother   . Heart failure Mother        Also has hx MVP s/p MVR  . Diabetes Mother   . Coronary artery disease Father        Died of MI at age 64  . Heart disease Other  Parent    ROS: [x]  Positive   [ ]  Negative   [ ]  All sytems reviewed and are negative  Cardiac: [x]  Hx CAD/CABG [x]  SOB []  DOE  Vascular: []  pain in legs while walking []  pain in legs at rest []  pain in legs at night [x]  non-healing ulcers [x]  swelling in legs  Pulmonary: [x]  asthma/wheezing [x]  home O2  Neurologic: [x]  hx of CVA []  mini stroke  Hematologic: []  hx of cancer []  bleeding problems []   problems with blood clotting easily  Endocrine:   [x]  diabetes []  thyroid disease  GI []  vomiting blood []  blood in stool  GU: []  CKD/renal failure []  HD--[]  M/W/F or []  T/T/S  Psychiatric: []  anxiety []  depression  Musculoskeletal: []  arthritis []  joint pain  Integumentary: []  rashes []  ulcers  Constitutional: []  fever []  chills   Physical Examination  Vitals:   04/05/19 0531 04/05/19 0816  BP: 94/62 101/62  Pulse: 68 66  Resp: 18 20  Temp: 98 F (36.7 C) 97.7 F (36.5 C)  SpO2: 100% 100%   Body mass index is 51.36 kg/m.  General:  WDWN in NAD Gait: Not observed HENT: WNL, normocephalic Pulmonary: normal non-labored breathing, without Rales, rhonchi,  wheezing Cardiac: regular Abdomen:  soft, NT/ND, no masses Skin: without rashes Vascular Exam/Pulses: Monophasic doppler signals right DP/PT; bilateral femoral pulses are palpable.  Extremities:Medial and lateral ankle wounds present on the right; right great toe wound present. Musculoskeletal: no muscle wasting or atrophy       Neurologic: A&O X 3;  No focal weakness or paresthesias are detected; speech is fluent/normal Psychiatric:  The pt has Normal affect.  CBC Labs (Brief)          Component Value Date/Time   WBC 9.3 04/05/2019 0603   RBC 3.13 (L) 04/05/2019 0603   HGB 9.2 (L) 04/05/2019 0603   HCT 28.4 (L) 04/05/2019 0603   PLT 274 04/05/2019 0603   MCV 90.7 04/05/2019 0603   MCH 29.4 04/05/2019 0603   MCHC 32.4 04/05/2019 0603   RDW 14.6 04/05/2019 0603   LYMPHSABS 1.3 12/14/2018 0912   MONOABS 0.5 12/14/2018 0912   EOSABS 0.0 12/14/2018 0912   BASOSABS 0.1 12/14/2018 0912      BMET Labs (Brief)          Component Value Date/Time   NA 135 04/05/2019 0603   NA 143 02/04/2019 1342   K 3.4 (L) 04/05/2019 0603   CL 87 (L) 04/05/2019 0603   CO2 33 (H) 04/05/2019 0603   GLUCOSE 205 (H) 04/05/2019 0603   BUN 81 (H) 04/05/2019 0603   BUN 19  02/04/2019 1342   CREATININE 1.63 (H) 04/05/2019 0603   CREATININE 1.06 (H) 11/16/2016 1503   CALCIUM 9.3 04/05/2019 0603   GFRNONAA 33 (L) 04/05/2019 0603   GFRNONAA 63 01/27/2015 1621   GFRAA 38 (L) 04/05/2019 0603   GFRAA 72 01/27/2015 1621      COAGS: Recent Labs       Lab Results  Component Value Date   INR 1.17 09/07/2011   INR 1.06 09/01/2011       Non-Invasive Vascular Imaging:   Venous duplex BLE 03/27/2019: Right: There is no evidence of deep vein thrombosis in the lower extremity. However, portions of this examination were limited- see technologist comments above. No cystic structure found in the popliteal fossa. Left: There is no evidence of deep vein thrombosis in the lower extremity. However, portions of this examination were limited- see technologist comments  above. No cystic structure found in the popliteal fossa.  ABI's 06/28/18: ABI/TBIToday's ABIToday's TBIPrevious ABIPrevious TBI +-------+-----------+-----------+------------+------------+ Right 0.54 0.23 0.59 0.31  +-------+-----------+-----------+------------+------------+ Left 0.83 0.57 0.91 0.51  +-------+-----------+-----------+------------+------------+  Carotid duplex 06/28/18: Right Carotid: Non-hemodynamically significant plaque <50% noted in the CCA. Patent right ICA stent with a EDV suggestive of 40-59% distal to stent. Right ECA not visualized as noted above.  Left Carotid: Velocities in the left ICA are consistent with a 40-59% stenosis. Non-hemodynamically significant plaque noted in the CCA. The ECA appears >50% stenosed.  Vertebrals: Bilateral vertebral arteries demonstrate antegrade flow. Subclavians: Bilateral subclavian arteries were stenotic. Bilateral subclavian artery flow was disturbed.  2D Echocardiogram  03/27/2019: IMPRESSIONS 1. The left ventricle has normal systolic function, with an ejection fraction of 55-60%. The cavity size was mildly dilated. There is moderately increased left ventricular wall thickness. Left ventricular diastolic Doppler parameters are consistent with pseudonormalization. Elevated 2. The right ventricle has normal systolic function. The cavity was normal. There is no increase in right ventricular wall thickness. 3. The mitral valve is grossly normal. There is mild mitral annular calcification present. Trivial MR. 4. The aortic valve was not well visualized. 5. Left atrial size was moderately dilated.   ASSESSMENT/PLAN: This is a 63 y.o. female with hx of right ankle fx s/p surgery and subsequent skin grafting being followed at the wound center and [facility] is consulted for non healing wounds.   -pt was seen by Dr. Darrick Penna back in October and had decreased ABI of 0.54 on the right at that time, but did not have any non healing wounds at that time.  She developed wounds after her right ankle fx and is being followed by the wound center with subsequent skin grafts. -pt will need arteriogram next week with Dr. Chestine Spore on Wednesday.  Okay for pt to discharge and come back next week.   Doreatha Massed, PA-C [facility] [phone]  I have seen and evaluated the patient. I agree with the PA note as documented above. Patient had right ankle fracture back in February 2020 and non-healing surgical wounds since per her report.  States went to wound care clinic and getting grafting as outpatient - unclear what substitute was used.  Last year ABI's were 0.54 on right with monophasic waveforms - no wounds at that time.  Previously followed by Dr. Darrick Penna and underwent right carotid stent procedure..  Would benefit from right leg arteriogram and possible intervention.  Palpable femoral pulses bilatearlly and right DP/PT monophasic.  States wants to go home.   Discussed with Dr. Margo Aye and otherwise clear for discharge.  Has right great toenail falling off, right feel wound, lateral ankle wound and shin wounds.  None of this appears infected.  Will arrange for outpatient arteriogram on Wednesday next week with me.  Discussed importance of elevating ankle.  Significant co-morbidities - walks with walker, on home oxygen with diastolic heart failure, CAD s/p CABG, HTN, HLD, DM.    Cephus Shelling, MD [facility] [place] Office: [phone] Pager: [phone]

## 2019-04-10 NOTE — Progress Notes (Addendum)
Dr Early notified of client c/o back pain and order noted; I discussed right pedal access with Dr Donnetta Hutching and notified him client has dressing over right posterior tibial  site and that I could not visually see site and that I was checking right foot for pedal pulse, color pink, capillary refill < 3 sec and no c/o pain

## 2019-04-10 NOTE — Progress Notes (Signed)
Up to wheelchair and tolerated well; right foot with 1+ dorsalis pedis pulse,right foot color pink and capillary refill less than 3sec; left groin stable after getting up, no bleeding or hematoma

## 2019-04-10 NOTE — Progress Notes (Signed)
Right arm IV noted to be infiltrated with baseball size swelling noted; IV d/ced

## 2019-04-10 NOTE — Discharge Instructions (Signed)
Femoral Site Care °This sheet gives you information about how to care for yourself after your procedure. Your health care provider may also give you more specific instructions. If you have problems or questions, contact your health care provider. °What can I expect after the procedure? °After the procedure, it is common to have: °· Bruising that usually fades within 1-2 weeks. °· Tenderness at the site. °Follow these instructions at home: °Wound care °· Follow instructions from your health care provider about how to take care of your insertion site. Make sure you: °? Wash your hands with soap and water before you change your bandage (dressing). If soap and water are not available, use hand sanitizer. °? Change your dressing as told by your health care provider. °? Leave stitches (sutures), skin glue, or adhesive strips in place. These skin closures may need to stay in place for 2 weeks or longer. If adhesive strip edges start to loosen and curl up, you may trim the loose edges. Do not remove adhesive strips completely unless your health care provider tells you to do that. °· Do not take baths, swim, or use a hot tub until your health care provider approves. °· You may shower 24-48 hours after the procedure or as told by your health care provider. °? Gently wash the site with plain soap and water. °? Pat the area dry with a clean towel. °? Do not rub the site. This may cause bleeding. °· Do not apply powder or lotion to the site. Keep the site clean and dry. °· Check your femoral site every day for signs of infection. Check for: °? Redness, swelling, or pain. °? Fluid or blood. °? Warmth. °? Pus or a bad smell. °Activity °· For the first 2-3 days after your procedure, or as long as directed: °? Avoid climbing stairs as much as possible. °? Do not squat. °· Do not lift anything that is heavier than 10 lb (4.5 kg), or the limit that you are told, until your health care provider says that it is safe. °· Rest as  directed. °? Avoid sitting for a long time without moving. Get up to take short walks every 1-2 hours. °· Do not drive for 24 hours if you were given a medicine to help you relax (sedative). °General instructions °· Take over-the-counter and prescription medicines only as told by your health care provider. °· Keep all follow-up visits as told by your health care provider. This is important. °Contact a health care provider if you have: °· A fever or chills. °· You have redness, swelling, or pain around your insertion site. °Get help right away if: °· The catheter insertion area swells very fast. °· You pass out. °· You suddenly start to sweat or your skin gets clammy. °· The catheter insertion area is bleeding, and the bleeding does not stop when you hold steady pressure on the area. °· The area near or just beyond the catheter insertion site becomes pale, cool, tingly, or numb. °These symptoms may represent a serious problem that is an emergency. Do not wait to see if the symptoms will go away. Get medical help right away. Call your local emergency services (911 in the [place] Do not drive yourself to the hospital. °Summary °· After the procedure, it is common to have bruising that usually fades within 1-2 weeks. °· Check your femoral site every day for signs of infection. °· Do not lift anything that is heavier than 10 lb (4.5 kg), or the   limit that you are told, until your health care provider says that it is safe. °This information is not intended to replace advice given to you by your health care provider. Make sure you discuss any questions you have with your health care provider. °Document Released: 05/09/2014 Document Revised: 09/18/2017 Document Reviewed: 09/18/2017 °Elsevier Patient Education © 2020 Else[employer] Inc[employer] ° ° ° °Moderate Conscious Sedation, Adult, Care After °These instructions provide you with information about caring for yourself after your procedure. Your health care provider may also give you  more specific instructions. Your treatment has been planned according to current medical practices, but problems sometimes occur. Call your health care provider if you have any problems or questions after your procedure. °What can I expect after the procedure? °After your procedure, it is common: °· To feel sleepy for several hours. °· To feel clumsy and have poor balance for several hours. °· To have poor judgment for several hours. °· To vomit if you eat too soon. °Follow these instructions at home: °For at least 24 hours after the procedure: ° °· Do not: °? Participate in activities where you could fall or become injured. °? Drive. °? Use heavy machinery. °? Drink alcohol. °? Take sleeping pills or medicines that cause drowsiness. °? Make important decisions or sign legal documents. °? Take care of children on your own. °· Rest. °Eating and drinking °· Follow the diet recommended by your health care provider. °· If you vomit: °? Drink water, juice, or soup when you can drink without vomiting. °? Make sure you have little or no nausea before eating solid foods. °General instructions °· Have a responsible adult stay with you until you are awake and alert. °· Take over-the-counter and prescription medicines only as told by your health care provider. °· If you smoke, do not smoke without supervision. °· Keep all follow-up visits as told by your health care provider. This is important. °Contact a health care provider if: °· You keep feeling nauseous or you keep vomiting. °· You feel light-headed. °· You develop a rash. °· You have a fever. °Get help right away if: °· You have trouble breathing. °This information is not intended to replace advice given to you by your health care provider. Make sure you discuss any questions you have with your health care provider. °Document Released: 06/26/2013 Document Revised: 08/18/2017 Document Reviewed: 12/26/2015 °Elsevier Patient Education © 2020 Elsevier Inc. ° °

## 2019-04-10 NOTE — Progress Notes (Signed)
CBG was tested in [place] upon the pt's arrival, and it was 53 , and she was given 8 oz of juice with four packets of sugar. Her repeat CBG was 70 at 1756. She was given next a small cup of peanut  butter.

## 2019-04-10 NOTE — Op Note (Addendum)
Patient name: Felicia Rivas MRN: [id number] DOB: Oct 12, 1955 Sex: female  04/10/2019 Pre-operative Diagnosis: Critical limb ischemia of the right lower extremity with tissue loss Post-operative diagnosis:  Same Surgeon:  Marty Heck, MD Procedure Performed: 1.  Ultrasound-guided access of the left common femoral artery 2.  Aortogram 3.  Bilateral lower extremity arteriogram with runoff 4.  Ultrasound-guided retrograde access of the right posterior tibial artery at the ankle 5.  Right SFA endovascular bypass (5 mm x 25 cm Viabahn and 5 mm x 15 cm Viabahn stents, angioplasty with 5 mm Sterling) 6.  127 minutes of monitored moderate conscious sedation time  Indications: Patient is a 63 year old female who was seen in consultation for nonhealing wound of her right lower extremity.  She ultimately broke her ankle earlier this year and has had plating done as well as multiple skin grafts.  She has had ongoing chronic wounds.  Noninvasive imaging was done that showed monophasic runoff at the ankle with noncompressible ABIs.  She presents today for right lower extremity arteriogram possible intervention after risks benefits were discussed.  Findings:   Aortogram showed no flow-limiting aortoiliac stenosis.  Left lower extremity arteriogram showed a patent common femoral profunda with a very diseased but patent SFA patent popliteal and three-vessel runoff.  Right lower extremity arteriogram showed a long segment SFA occlusion with only a small SFA stump proximally that was diseased and ultimately reconstitution of a more robust above-knee popliteal artery.  The patient had three-vessel runoff of the right lower extremity.  Ultimately the patient is a very poor operative candidate.  She has a BMI of 50 and is morbidly obese with limited mobility as well as history of a stroke, coronary artery disease status post MI with CABG, sleep apnea on home oxygen etc. I elected to perform endovascular  intervention.  Ultimately I tried to get down antegrade across her SFA occlusion and got into a dissection plane and could not reenter the true lumen with a small perforation distally in the distal SFA.  Ultimately I accessed the right posterior tibial artery at the ankle retrograde with ultrasound and then ultimately crossed SFA occlusion retrograde and snared my wire from the sheath.  Over the flossed wire we then angioplastied the entire SFA up to the ostium with a 5 mm angioplasty balloon and then performed endovascular bypass with two long 5 mm Viabahn stents.  Patient had inline flow down the SFA with no residual stenosis and three-vessel runoff at the completion of the case.  She had a palpable pulse in her right foot.   Procedure:  The patient was identified in the holding area and taken to room 8.  The patient was then placed supine on the table and prepped and draped in the usual sterile fashion.  A time out was called.  Ultrasound was used to evaluate the left common femoral artery.  It was patent .  A digital ultrasound image was acquired.  A micropuncture needle was used to access the left common femoral artery under ultrasound guidance.  An 018 wire was advanced without resistance and a micropuncture sheath was placed.  The 018 wire was removed and a benson wire was placed.  The micropuncture sheath was exchanged for a 5 french sheath.  An omniflush catheter was advanced over the wire to the level of L-1.  An abdominal angiogram was obtained.  Next the catheter was pulled down and bilateral lower extremity runoff was obtained.  Pertinent findings are noted above.  Given the right side is the side of interest with nonhealing wound, I elected to try and intervene given that she is a poor operative candidate.  She would certainly benefit from open bypass if she were better surgical candidate.  Ultimately used a Glidewire advantage and selected contralateral common iliac and got my wire down her profunda  on the right.  Exchanged for a long 6 JamaicaFrench Ansell sheath over the aortic bifurcation in the left groin.  Patient was given 100 units/kg heparin.  At that point in time I then cannulated SFA stump with my Glidewire advantage and using a quick cross catheter tried multiple times to get across her SFA occlusion where it reconstituted in the mid to distal SFA although it was very diseased there.  Ultimately I was in a large dissection plane and could not reenter the true lumen antegrade.  I also tried to re-enter using smaller wire and catheter with V18.  In addition I had a small perforation in the distal SFA.  At that point time I elected to access the right foot.  The right foot was prepped and draped in the usual sterile fashion.  Ultrasound was used to evaluate the posterior tibial artery at the ankle it was patent and image was saved.  A micro-access needle was then used to access the posterior tibial artery with ultrasound I had pulsatile backbleeding put in a microwire and then a short 4 JamaicaFrench sheath.  I then used a V 18 with a CXI catheter and came retrograde up the posterior tibial across the popliteal and then cross the SFA occlusion retrograde until I got back in the stump just at the takeoff of the common femoral.  This wire was then snared from the left groin sheath with a flossed wire.  I then got some additional pictures to look at where she reconstituted and it appeared that her above-knee popliteal artery was healthy but pretty much the entire SFA was diseased and or occluded.  I then angioplastied the entire SFA segment with a 5 mm Sterling.  We then elected to perform a vascular bypass given that the artery looked fairly diseased with a lot of residual stenosis after balloon angioplasty alone.  A 5 mm x 25 cm Viabahn was deployed from the above knee popliteal artery up into the SFA I then got a dedicated picture to identify the takeoff of the SFA stump and then deployed a second 5 mm x 15 cm  Viabahn at the takeoff of the SFA.  All this was then ballooned with a 5 mm balloon.  We did get a very steep right anterior oblique image once we had deployed the Viabahn to ensure that we did not jail the profunda.  The proximal profunda remained patent and the stent was right up to the takeoff of SFA.  There was inline flow down the SFA with no evidence of residual stenosis and preserved three-vessel runoff in the right lower extremity.  Patient had a palpable pulse in the right foot.  The sheath was removed and the posterior tibial artery manual pressure was held.  I exchanged for a short 6 French sheath in the left groin after all wires and catheters were removed.  I attempted to deploy a mynx in the left groin but unfortunately it failed and we had to hold manual pressure.  She will be taken to recovery in stable condition.  Complication: None  Condition: Stable    Cephus Shellinghristopher J. Morena Mckissack, MD [facility] [place] Office: [phone] Pager: [phone]  Cephus Shellinghristopher J Martha Soltys

## 2019-04-10 NOTE — Telephone Encounter (Signed)
LVM, reminding pt of her appt on 04-11-19 with Roby Lofts.

## 2019-04-11 ENCOUNTER — Encounter (HOSPITAL_COMMUNITY): Payer: Self-pay | Admitting: Vascular Surgery

## 2019-04-11 ENCOUNTER — Ambulatory Visit: Payer: BC Managed Care – PPO | Admitting: Medical

## 2019-04-15 DIAGNOSIS — I447 Left bundle-branch block, unspecified: Secondary | ICD-10-CM

## 2019-04-19 DIAGNOSIS — G894 Chronic pain syndrome: Secondary | ICD-10-CM | POA: Insufficient documentation

## 2019-04-23 NOTE — Progress Notes (Signed)
HPI: FU CAD and diastolic CHF. She is status post CABG 08/2011.Carotid DopplersApril 2018 showed 80-99% right and 1-39% left. Patient had carotid stent placed July 2018.Nuclear study December 2019 showed ejection fraction 47%, reversible medium size inferolateral defect suggestive of ischemia.  Treated medically.  Pain with acute on chronic diastolic congestive heart failure March 2020.  Lasix was increased. Last echocardiogram July 2020 showed normal LV function, moderate left ventricular hypertrophy, grade 2 diastolic dysfunction and moderate left atrial enlargement.  Patient had arteriogram July 2020 that showed an occluded SFA on the right.  She had stents placed at that time.  Patient was admitted July 2020 with acute on chronic diastolic congestive heart failure and diuresed.  Since she was last seen,  her dyspnea is much better.  She lost approximately 35 pounds when she was in the hospital.  No chest pain.  No syncope.  Her pedal edema has improved.  Current Outpatient Medications  Medication Sig Dispense Refill  . albuterol (PROVENTIL HFA;VENTOLIN HFA) 108 (90 BASE) MCG/ACT inhaler Inhale 2 puffs into the lungs every 6 (six) hours as needed for wheezing or shortness of breath. 1 Inhaler 1  . aspirin EC 81 MG tablet Take 1 tablet (81 mg total) by mouth daily. 90 tablet 3  . atorvastatin (LIPITOR) 80 MG tablet Take 1 tablet (80 mg total) by mouth daily. 30 tablet 8  . budesonide-formoterol (SYMBICORT) 160-4.5 MCG/ACT inhaler Inhale 2 puffs into the lungs 2 (two) times daily as needed (SOB).     [name]. clopidogrel (PLAVIX) 75 MG tablet Take 1 tablet (75 mg total) by mouth daily. 90 tablet 3  . gabapentin (NEURONTIN) 300 MG capsule Take 600 mg by mouth 3 (three) times daily.    . insulin regular human CONCENTRATED (HUMULIN R) 500 UNIT/ML injection Inject 10-20 Units into the skin See admin instructions. Inject 20 units in the morning (8AM) and 10-15units at night (10PM) with additional as  needed. *Max daily dosage 65units/day    . Liraglutide 18 MG/3ML SOPN Inject 1.8 mg into the skin daily.    . metoprolol succinate (TOPROL-XL) 50 MG 24 hr tablet Take 1 tablet (50 mg total) by mouth daily. Take with or immediately following a meal. 30 tablet 0  . oxyCODONE-acetaminophen (PERCOCET/ROXICET) 5-325 MG tablet Take 1 tablet by mouth 2 (two) times daily as needed for severe pain. 10 tablet 0  . OXYGEN Inhale 2.5 L/day into the lungs continuous.     . pantoprazole (PROTONIX) 40 MG tablet Take 1 tablet (40 mg total) by mouth daily at 12 noon. 30 tablet 0  . potassium chloride SA (K-DUR) 20 MEQ tablet Take 2 tablets (40 mEq total) by mouth 2 (two) times daily. 90 tablet 0  . torsemide (DEMADEX) 20 MG tablet Take 2 tablets (40 mg total) by mouth daily with breakfast. Please take Torsemide 40 mg qam and 20 mg qpm 90 tablet 0  . VITAMIN D, CHOLECALCIFEROL, PO Take 1 tablet by mouth daily.     No current facility-administered medications for this visit.      Past Medical History:  Diagnosis Date  . Asthma   . Asthma   . CAD (coronary artery disease)    a. NSTEMI 12/12 -> s/p CABGx3.   . Carotid artery disease (HCC)    a. Carotid US (08/2013): R 60-79%; L 40-59%; f/u 6 mos.  . Carotid stenosis    a. Carotid US (08/2013):  R 60-79%; L 40-59%; f/u 6 mos  .  Chronic diastolic CHF (congestive heart failure) (HCC)    a. Echo 08/2011: mod LVH, nl EF. b. Echo 01/2013: EF 55-60%, mod-sev LVH, mod-sev LA dilitation  . Chronic diastolic heart failure (HCC)    a. Echo 08/2011: mod LVH, nl EF. b. Echo 01/2013: EF 55-60%, mod-sev LVH, mod-sev LA dilitation.  . Complication of anesthesia    '' Myself & my family wake up during surgery "  . Complication of anesthesia   . Coronary artery disease    a. NSTEMI 12/12 -> s/p CABGx3.  [name]. Cough   . Cough    a. 2014: Suspect a combination of CHF, GERD and ACE inhibitor-induced cough.  . Diabetes mellitus   . Diabetes mellitus (HCC)   . History of  chicken pox   . HLD (hyperlipidemia)   . HTN (hypertension)   . Hyperlipidemia   . Hypertension   . Morbid obesity (HCC)   . Myocardial infarction (HCC)   . PONV (postoperative nausea and vomiting)   . S/P CABG (coronary artery bypass graft)    a. 2012 - Dr. Laneta SimmersBartle;  LIMA-LAD, SVG-OM 2, SVG-PDA.  . S/P CABG x 3 09/07/11   Dr. Laneta SimmersBartle;   LIMA-LAD, SVG-OM 2, SVG-PDA.  . Sleep disturbance    a. Had sleep study several years ago, wasn't told she has OSA but told she needs O2 at night.  . Sleep disturbance    Had sleep study several years ago, wasn't told she has OSA but told she needs O2 at night  . Stroke (HCC)   . Stroke [facility](HCC) 2006   denies residual  . TIA (transient ischemic attack)     Past Surgical History:  Procedure Laterality Date  . ABDOMINAL AORTOGRAM W/LOWER EXTREMITY Bilateral 04/10/2019   Procedure: ABDOMINAL AORTOGRAM W/LOWER EXTREMITY;  Surgeon: Cephus Shellinglark, Christopher J, MD;  [facility];  Service: Cardiovascular;  Laterality: Bilateral;  . CAROTID ANGIOGRAPHY N/A 03/17/2017   Procedure: Carotid Angiography;  Surgeon: Sherren KernsFields, Charles E, MD;  [facility];  Service: Cardiovascular;  Laterality: N/A;  . CAROTID PTA/STENT INTERVENTION Right 03/29/2017   Procedure: Carotid PTA/Stent Intervention;  Surgeon: Sherren KernsFields, Charles E, MD;  Location: [facility] INVASIVE CV LAB;  Service: Cardiovascular;  Laterality: Right;  . CHOLECYSTECTOMY    . CHOLECYSTECTOMY  1990's  . CORONARY ARTERY BYPASS GRAFT    . CORONARY ARTERY BYPASS GRAFT  09/07/2011   CABG X3; Procedure: CORONARY ARTERY BYPASS GRAFTING (CABG);  Surgeon: Alleen BorneBryan K Bartle, MD;  [facility];  Service: Open Heart Surgery;  Laterality: N/A;  . LEFT HEART CATHETERIZATION WITH CORONARY ANGIOGRAM N/A 09/02/2011   Procedure: LEFT HEART CATHETERIZATION WITH CORONARY ANGIOGRAM;  Surgeon: Herby Abrahamhomas D Stuckey, MD;  [facility];  Service: Cardiovascular;  Laterality: N/A;  . PERIPHERAL VASCULAR INTERVENTION  Right 04/10/2019   Procedure: PERIPHERAL VASCULAR INTERVENTION;  Surgeon: Cephus Shellinglark, Christopher J, MD;  Location: [facility] INVASIVE CV LAB;  Service: Cardiovascular;  Laterality: Right;  superficial femoral artery  . TUBAL LIGATION  1981    Social History   Socioeconomic History  . Marital status: Married    Spouse name: Not on file  . Number of children: 3  . Years of education: 9216  . Highest education level: Not on file  Occupational History  . Occupation: Magazine features editorTeacher    Employer: [employer]  Social Needs  . Financial resource strain: Not on file  . Food insecurity    Worry: Not on file    Inability: Not on  file  . Transportation needs    Medical: Not on file    Non-medical: Not on file  Tobacco Use  . Smoking status: Former Smoker    Packs/day: 0.50    Years: 25.00    Pack years: 12.50    Types: Cigarettes    Quit date: 11/11/2011    Years since quitting: 7.4  . Smokeless tobacco: Former Network engineer and Sexual Activity  . Alcohol use: No  . Drug use: No  . Sexual activity: Never  Lifestyle  . Physical activity    Days per week: Not on file    Minutes per session: Not on file  . Stress: Not on file  Relationships  . Social Herbalist on phone: Not on file    Gets together: Not on file    Attends religious service: Not on file    Active member of club or organization: Not on file    Attends meetings of clubs or organizations: Not on file    Relationship status: Not on file  . Intimate partner violence    Fear of current or ex partner: Not on file    Emotionally abused: Not on file    Physically abused: Not on file    Forced sexual activity: Not on file  Other Topics Concern  . Not on file  Social History Narrative   ** Merged History Encounter **       Regular exercise-yes    Family History  Problem Relation Age of Onset  . Cancer Mother   . Heart failure Mother        Also has hx MVP s/p MVR  . Diabetes Mother   . Coronary artery  disease Father        Died of MI at age 58  . Heart disease Other        Parent    ROS: no fevers or chills, productive cough, hemoptysis, dysphasia, odynophagia, melena, hematochezia, dysuria, hematuria, rash, seizure activity, orthopnea, PND, Physical Exam: Well-developed obese in no acute distress.  Skin is warm and dry.  HEENT is normal.  Neck is supple.  Chest is clear to auscultation with normal expansion.  Cardiovascular exam is regular rate and rhythm.  Abdominal exam nontender or distended. No masses palpated. Extremities show 1+ edema. neuro grossly intact  A/P  1 chronic diastolic congestive heart failure-patient has improved since her recent hospitalization.  We will continue present dose of Demadex.  She will weigh daily and take an additional 20 mg of Demadex for weight gain of 2 to 3 pounds.  Continue fluid restriction and low-sodium diet.  Check potassium and renal function.  2 coronary artery disease-patient continues to deny chest pain.  Continue medical therapy with aspirin and statin.  3 chronic stage III kidney disease-recheck potassium and renal function today.  Referred to nephrology.  4 hypertension-patient's blood pressure is controlled.  Continue present medications and follow.  5 hyperlipidemia-continue statin.  6 peripheral vascular disease/carotid artery disease-patient denies lower extremity pain.  Followed by vascular surgery.  Continue aspirin and statin.  7 morbid obesity-we again discussed the importance of diet, exercise and weight loss.  Kirk Ruths, MD

## 2019-04-24 ENCOUNTER — Other Ambulatory Visit: Payer: Self-pay

## 2019-04-24 ENCOUNTER — Other Ambulatory Visit: Payer: Self-pay | Admitting: *Deleted

## 2019-04-24 ENCOUNTER — Encounter: Payer: Self-pay | Admitting: Cardiology

## 2019-04-24 ENCOUNTER — Ambulatory Visit (INDEPENDENT_AMBULATORY_CARE_PROVIDER_SITE_OTHER): Payer: BC Managed Care – PPO | Admitting: Cardiology

## 2019-04-24 VITALS — BP 136/70 | HR 62 | Temp 98.4°F | Ht 66.0 in | Wt 311.0 lb

## 2019-04-24 DIAGNOSIS — I739 Peripheral vascular disease, unspecified: Secondary | ICD-10-CM

## 2019-04-24 DIAGNOSIS — N289 Disorder of kidney and ureter, unspecified: Secondary | ICD-10-CM | POA: Diagnosis not present

## 2019-04-24 DIAGNOSIS — I5032 Chronic diastolic (congestive) heart failure: Secondary | ICD-10-CM | POA: Diagnosis not present

## 2019-04-24 DIAGNOSIS — I25810 Atherosclerosis of coronary artery bypass graft(s) without angina pectoris: Secondary | ICD-10-CM

## 2019-04-24 MED ORDER — METOPROLOL SUCCINATE ER 50 MG PO TB24: 50.0000 mg | ORAL_TABLET | Freq: Every day | ORAL | 3 refills | Status: DC

## 2019-04-24 NOTE — Patient Instructions (Signed)
Medication Instructions:  NO CHANGE If you need a refill on your cardiac medications before your next appointment, please call your pharmacy.   Lab work: Your physician recommends that you HAVE LAB WORK WHEN CONVENIENT If you have labs (blood work) drawn today and your tests are completely normal, you will receive your results only by: [name] MyChart Message (if you have MyChart) OR . A paper copy in the mail If you have any lab test that is abnormal or we need to change your treatment, we will call you to review the results.  Follow-Up: At [facility], you and your health needs are our priority.  As part of our continuing mission to provide you with exceptional heart care, we have created designated Provider Care Teams.  These Care Teams include your primary Cardiologist (physician) and Advanced Practice Providers (APPs -  Physician Assistants and Nurse Practitioners) who all work together to provide you with the care you need, when you need it. Your physician recommends that you schedule a follow-up appointment in: [place]

## 2019-04-25 ENCOUNTER — Telehealth: Payer: Self-pay | Admitting: Cardiology

## 2019-04-25 NOTE — Telephone Encounter (Signed)
Faxed records to [facility] for referral for renal insufficiency.

## 2019-05-08 ENCOUNTER — Other Ambulatory Visit: Payer: Self-pay

## 2019-05-08 DIAGNOSIS — I739 Peripheral vascular disease, unspecified: Secondary | ICD-10-CM

## 2019-05-09 LAB — BASIC METABOLIC PANEL
BUN/Creatinine Ratio: 14 (ref 12–28)
BUN: 18 mg/dL (ref 8–27)
CO2: 27 mmol/L (ref 20–29)
Calcium: 9.6 mg/dL (ref 8.7–10.3)
Chloride: 95 mmol/L — ABNORMAL LOW (ref 96–106)
Creatinine, Ser: 1.26 mg/dL — ABNORMAL HIGH (ref 0.57–1.00)
GFR calc Af Amer: 52 mL/min/{1.73_m2} — ABNORMAL LOW (ref >59)
GFR calc non Af Amer: 45 mL/min/{1.73_m2} — ABNORMAL LOW (ref >59)
Glucose: 167 mg/dL — ABNORMAL HIGH (ref 65–99)
Potassium: 4.9 mmol/L (ref 3.5–5.2)
Sodium: 139 mmol/L (ref 134–144)

## 2019-05-14 ENCOUNTER — Encounter (HOSPITAL_COMMUNITY): Payer: BC Managed Care – PPO

## 2019-05-14 ENCOUNTER — Encounter: Payer: BC Managed Care – PPO | Admitting: Vascular Surgery

## 2019-05-20 ENCOUNTER — Other Ambulatory Visit: Payer: Self-pay

## 2019-05-20 MED ORDER — METOPROLOL SUCCINATE ER 50 MG PO TB24: 50.0000 mg | ORAL_TABLET | Freq: Every day | ORAL | 3 refills | Status: AC

## 2019-05-20 MED ORDER — ATORVASTATIN CALCIUM 80 MG PO TABS: 80.0000 mg | ORAL_TABLET | Freq: Every day | ORAL | 8 refills | Status: DC

## 2019-06-05 ENCOUNTER — Other Ambulatory Visit: Payer: Self-pay | Admitting: Cardiology

## 2019-07-01 ENCOUNTER — Other Ambulatory Visit: Payer: Self-pay

## 2019-07-01 ENCOUNTER — Telehealth (HOSPITAL_COMMUNITY): Payer: Self-pay

## 2019-07-01 DIAGNOSIS — I739 Peripheral vascular disease, unspecified: Secondary | ICD-10-CM

## 2019-07-01 NOTE — Telephone Encounter (Signed)

## 2019-07-02 ENCOUNTER — Encounter: Payer: Self-pay | Admitting: Vascular Surgery

## 2019-07-02 ENCOUNTER — Other Ambulatory Visit: Payer: Self-pay

## 2019-07-02 ENCOUNTER — Ambulatory Visit (INDEPENDENT_AMBULATORY_CARE_PROVIDER_SITE_OTHER)
Admission: RE | Admit: 2019-07-02 | Discharge: 2019-07-02 | Disposition: A | Discharge status: Home / Self Care | Payer: BC Managed Care – PPO | Source: Ambulatory Visit | Attending: Vascular Surgery | Admitting: Vascular Surgery

## 2019-07-02 ENCOUNTER — Ambulatory Visit (HOSPITAL_COMMUNITY)
Admission: RE | Admit: 2019-07-02 | Discharge: 2019-07-02 | Disposition: A | Discharge status: Home / Self Care | Payer: BC Managed Care – PPO | Source: Ambulatory Visit | Attending: Vascular Surgery | Admitting: Vascular Surgery

## 2019-07-02 ENCOUNTER — Ambulatory Visit (INDEPENDENT_AMBULATORY_CARE_PROVIDER_SITE_OTHER): Payer: BC Managed Care – PPO | Admitting: Vascular Surgery

## 2019-07-02 VITALS — BP 99/44 | HR 59 | Temp 97.0°F | Resp 16

## 2019-07-02 DIAGNOSIS — I739 Peripheral vascular disease, unspecified: Secondary | ICD-10-CM | POA: Insufficient documentation

## 2019-07-02 DIAGNOSIS — I6523 Occlusion and stenosis of bilateral carotid arteries: Secondary | ICD-10-CM | POA: Diagnosis not present

## 2019-07-02 DIAGNOSIS — I998 Other disorder of circulatory system: Secondary | ICD-10-CM | POA: Diagnosis not present

## 2019-07-02 DIAGNOSIS — I70229 Atherosclerosis of native arteries of extremities with rest pain, unspecified extremity: Secondary | ICD-10-CM

## 2019-07-02 NOTE — Progress Notes (Signed)
Patient name: Felicia Rivas Dedic MRN: [id number] DOB: 09/19/1956 Sex: female  REASON FOR VISIT: Follow-up after right SFA endovascular bypass  HPI: Felicia Rivas Kunsman is a 63 y.o. female with multiple medical problems that presents for follow-up.  Patient previously presented with critical limb ischemia of the right lower extremity with tissue loss after a fall and had distal ankle fracture.  On 04/10/2019 ultimately underwent retrograde access of the right posterior tibial at the ankle and then right SFA endovascular bypass.  Her wound is completely healed.  She states her leg feels good.  No specific concerns other than asking about surveillance of her right carotid stent previously done by Dr. Darrick Pennafields.  No new neurologic symptoms otherwise.  She seems very pleased.  Past Medical History:  Diagnosis Date  . Asthma   . Asthma   . CAD (coronary artery disease)    a. NSTEMI 12/12 -> s/p CABGx3.   . Carotid artery disease (HCC)    a. Carotid US (08/2013): R 60-79%; L 40-59%; f/u 6 mos.  . Carotid stenosis    a. Carotid US (08/2013):  R 60-79%; L 40-59%; f/u 6 mos  . Chronic diastolic CHF (congestive heart failure) (HCC)    a. Echo 08/2011: mod LVH, nl EF. b. Echo 01/2013: EF 55-60%, mod-sev LVH, mod-sev LA dilitation  . Chronic diastolic heart failure (HCC)    a. Echo 08/2011: mod LVH, nl EF. b. Echo 01/2013: EF 55-60%, mod-sev LVH, mod-sev LA dilitation.  . Complication of anesthesia    '' Myself & my family wake up during surgery "  . Complication of anesthesia   . Coronary artery disease    a. NSTEMI 12/12 -> s/p CABGx3.  [name]. Cough   . Cough    a. 2014: Suspect a combination of CHF, GERD and ACE inhibitor-induced cough.  . Diabetes mellitus   . Diabetes mellitus (HCC)   . History of chicken pox   . HLD (hyperlipidemia)   . HTN (hypertension)   . Hyperlipidemia   . Hypertension   . Morbid obesity (HCC)   . Myocardial infarction (HCC)   . PONV (postoperative nausea and vomiting)   . S/P  CABG (coronary artery bypass graft)    a. 2012 - Dr. Laneta SimmersBartle;  LIMA-LAD, SVG-OM 2, SVG-PDA.  . S/P CABG x 3 09/07/11   Dr. Laneta SimmersBartle;   LIMA-LAD, SVG-OM 2, SVG-PDA.  . Sleep disturbance    a. Had sleep study several years ago, wasn't told she has OSA but told she needs O2 at night.  . Sleep disturbance    Had sleep study several years ago, wasn't told she has OSA but told she needs O2 at night  . Stroke (HCC)   . Stroke [facility](HCC) 2006   denies residual  . TIA (transient ischemic attack)     Past Surgical History:  Procedure Laterality Date  . ABDOMINAL AORTOGRAM W/LOWER EXTREMITY Bilateral 04/10/2019   Procedure: ABDOMINAL AORTOGRAM W/LOWER EXTREMITY;  Surgeon: Cephus Shellinglark, Josefa Syracuse J, MD;  [facility];  Service: Cardiovascular;  Laterality: Bilateral;  . CAROTID ANGIOGRAPHY N/A 03/17/2017   Procedure: Carotid Angiography;  Surgeon: Sherren KernsFields, Charles E, MD;  [facility];  Service: Cardiovascular;  Laterality: N/A;  . CAROTID PTA/STENT INTERVENTION Right 03/29/2017   Procedure: Carotid PTA/Stent Intervention;  Surgeon: Sherren KernsFields, Charles E, MD;  Location: [facility] INVAS[facility] CV LAB;  Service: Cardiovascular;  Laterality: Right;  . CHOLECYSTECTOMY    . CHOLECYSTECTOMY  1990's  . CORONARY ARTERY BYPASS GRAFT    .  CORONARY ARTERY BYPASS GRAFT  09/07/2011   CABG X3; Procedure: CORONARY ARTERY BYPASS GRAFTING (CABG);  Surgeon: Gaye Pollack, MD;  Location: [facility];  Service: Open Heart Surgery;  Laterality: N/A;  . LEFT HEART CATHETERIZATION WITH CORONARY ANGIOGRAM N/A 09/02/2011   Procedure: LEFT HEART CATHETERIZATION WITH CORONARY ANGIOGRAM;  Surgeon: Hillary Bow, MD;  Location: [facility] CATH LAB;  Service: Cardiovascular;  Laterality: N/A;  . PERIPHERAL VASCULAR INTERVENTION Right 04/10/2019   Procedure: PERIPHERAL VASCULAR INTERVENTION;  Surgeon: Marty Heck, MD;  Location: [facility] CV LAB;  Service: Cardiovascular;  Laterality: Right;  superficial femoral artery  .  TUBAL LIGATION  1981    Family History  Problem Relation Age of Onset  . Cancer Mother   . Heart failure Mother        Also has hx MVP s/p MVR  . Diabetes Mother   . Coronary artery disease Father        Died of MI at age 62  . Heart disease Other        Parent    SOCIAL HISTORY: Social History   Tobacco Use  . Smoking status: Former Smoker    Packs/day: 0.50    Years: 25.00    Pack years: 12.50    Types: Cigarettes    Quit date: 11/11/2011    Years since quitting: 7.6  . Smokeless tobacco: Former Network engineer Use Topics  . Alcohol use: No    Allergies  Allergen Reactions  . Lisinopril Cough, Nausea And Vomiting and Other (See Comments)    ACE Inhibitor Cough ?Cough - changed to ARB 02/2013. ACE Inhibitor Cough ACE Inhibitor Cough ?Cough - changed to ARB 02/2013. ACE Inhibitor Cough ?Cough - changed to ARB 02/2013.  [name] Other Hives    Unknown IV antibiotic  . Tramadol     Causes respiratory distress    Current Outpatient Medications  Medication Sig Dispense Refill  . albuterol (PROVENTIL HFA;VENTOLIN HFA) 108 (90 BASE) MCG/ACT inhaler Inhale 2 puffs into the lungs every 6 (six) hours as needed for wheezing or shortness of breath. 1 Inhaler 1  . aspirin EC 81 MG tablet Take 1 tablet (81 mg total) by mouth daily. 90 tablet 3  . atorvastatin (LIPITOR) 80 MG tablet Take 1 tablet (80 mg total) by mouth daily. 30 tablet 8  . budesonide-formoterol (SYMBICORT) 160-4.5 MCG/ACT inhaler Inhale 2 puffs into the lungs 2 (two) times daily as needed (SOB).     [name] clopidogrel (PLAVIX) 75 MG tablet Take 1 tablet (75 mg total) by mouth daily. 90 tablet 3  . gabapentin (NEURONTIN) 300 MG capsule Take three capsules (900 mg dose) by mouth 3 (three) times a day.    . insulin regular human CONCENTRATED (HUMULIN R) 500 UNIT/ML injection Inject 10-20 Units into the skin See admin instructions. Inject 20 units in the morning (8AM) and 10-15units at night (10PM) with additional as needed.  *Max daily dosage 65units/day    . Liraglutide 18 MG/3ML SOPN Inject 1.8 mg into the skin daily.    . metoprolol succinate (TOPROL-XL) 50 MG 24 hr tablet Take 1 tablet (50 mg total) by mouth daily. Take with or immediately following a meal. 90 tablet 3  . OXYGEN Inhale 2.5 L/day into the lungs continuous.     . pantoprazole (PROTONIX) 40 MG tablet Take 1 tablet (40 mg total) by mouth daily at 12 noon. 30 tablet 0  . potassium chloride SA (K-DUR) 20 MEQ tablet Take 2 tablets (40  mEq total) by mouth 2 (two) times daily. 90 tablet 0  . torsemide (DEMADEX) 20 MG tablet take 2 TABLETS (40 mg) EVERY MORNING and 1 TABLET (20 mg) EVERY EVENING] 180 tablet 3  . VITAMIN D, CHOLECALCIFEROL, PO Take 1 tablet by mouth daily.    [name] oxyCODONE-acetaminophen (PERCOCET/ROXICET) 5-325 MG tablet Take 1 tablet by mouth 2 (two) times daily as needed for severe pain. (Patient not taking: Reported on 07/02/2019) 10 tablet 0   No current facility-administered medications for this visit.     REVIEW OF SYSTEMS:   denotes positive finding,  denotes negative finding Cardiac  Comments:  Chest pain or chest pressure:    Shortness of breath upon exertion:    Short of breath when lying flat:    Irregular heart rhythm:        Vascular    Pain in calf, thigh, or hip brought on by ambulation:    Pain in feet at night that wakes you up from your sleep:     Blood clot in your veins:    Leg swelling:         Pulmonary    Oxygen at home:    Productive cough:     Wheezing:         Neurologic    Sudden weakness in arms or legs:     Sudden numbness in arms or legs:     Sudden onset of difficulty speaking or slurred speech:    Temporary loss of vision in one eye:     Problems with dizziness:         Gastrointestinal    Blood in stool:     Vomited blood:         Genitourinary    Burning when urinating:     Blood in urine:        Psychiatric    Major depression:         Hematologic    Bleeding problems:     Problems with blood clotting too easily:        Skin    Rashes or ulcers:        Constitutional    Fever or chills:      PHYSICAL EXAM: Vitals:   07/02/19 1535  BP: (!) 99/44  Pulse: (!) 59  Resp: 16  Temp: (!) 97 F (36.1 C)  TempSrc: Temporal  SpO2: 100%    GENERAL:  Sitting in wheelchair.  Nasal cannula. CARDIAC: There is a regular rate and rhythm.  VASCULAR:  Sitting in wheelchair, unable to evaluate groins Right DP palpable No tissue loss lower extremity, wound healed   DATA:   Right SFA stent with triphasic waveforms and patent.  ABIs are 1.25 on the right and triphasic, 0.96 on the left and biphasic: The right ABIs significantly improved from 0.54 previously  Assessment/Plan:  63 yo with significant medical co-morbidities presents for follow-up after right SFA endovascular bypass with a long Viabahn that included retrograde posterior tibial access at the ankle.  Her stent is patent on duplex today with triphasic waveforms.  The velocities look adequate.  She now has normal ABI at the foot.  On exam she has a palpable dorsalis pedis.  Very pleased with her progress.  I will see her back in 6 months with right lower extremity arterial duplex and ABIs.  She was asking about surveillance of her right carotid stent.  I will plan to get carotid duplex since her last surveillance study was  one year ago.  I will follow-up with her if the results are concerning.   Cephus Shelling, MD [facility] Office: [phone] Pager: [phone]

## 2019-07-03 ENCOUNTER — Other Ambulatory Visit: Payer: Self-pay | Admitting: *Deleted

## 2019-07-03 ENCOUNTER — Ambulatory Visit (HOSPITAL_COMMUNITY)
Admission: RE | Admit: 2019-07-03 | Discharge: 2019-07-03 | Disposition: A | Discharge status: Home / Self Care | Payer: BC Managed Care – PPO | Source: Ambulatory Visit | Attending: Family | Admitting: Family

## 2019-07-03 DIAGNOSIS — I6523 Occlusion and stenosis of bilateral carotid arteries: Secondary | ICD-10-CM

## 2019-07-10 ENCOUNTER — Telehealth: Payer: Self-pay | Admitting: *Deleted

## 2019-07-10 NOTE — Telephone Encounter (Signed)
    [facility] Pre-operative Risk Assessment    Request for surgical clearance:  1. What type of surgery is being performed? Right ankle hardware removal/ ankle fusion   2. When is this surgery scheduled? TBD   3. What type of clearance is required (medical clearance vs. Pharmacy clearance to hold med vs. Both)? medical  4. Are there any medications that need to be held prior to surgery and how long? none   5. Practice name and name of physician performing surgery?  Timothy vogler md   6. What is your office phone number [phone]    7.   What is your office fax number [phone]  8.   Anesthesia type (None, local, MAC, general) ? general   Felicia Rivas 07/10/2019, 5:44 PM  _________________________________________________________________   (provider comments below)

## 2019-07-11 ENCOUNTER — Telehealth: Payer: Self-pay

## 2019-07-11 NOTE — Telephone Encounter (Signed)
   Primary Cardiologist: Kirk Ruths, MD  Chart reviewed as part of pre-operative protocol coverage. Given past medical history and time since last visit, based on ACC/AHA guidelines, Felicia Rivas would be at acceptable risk for the planned procedure without further cardiovascular testing.   Spoke with patient on 07/11/2019 and she has been doing well from a cardiac perspective since her last OV with Dr. Stanford Breed 04/25/2019. She reports her weight has been stable and she has had no SOB, LE swelling or chest pain. She is on Plavix therapy, managed by Dr. Carlis Abbott with vascular surgery. If this needs to be held, recommendations will need to come from his team.   I will route this recommendation to the requesting party via Epic fax function and remove from pre-op pool.  Please call with questions.  Kathyrn Drown, NP 07/11/2019, 10:13 AM

## 2019-07-11 NOTE — Telephone Encounter (Signed)
Pt called and wanted to know if she could hold her Plavix for ankle surgery.   Left message advising her to have the surgeon send a request to hold Plavix with the information of what she is having done. That will determine how long she will be off the med if approved.   York Cerise, CMA

## 2019-07-12 NOTE — Progress Notes (Signed)
Virtual Visit via Video Note   This visit type was conducted due to national recommendations for restrictions regarding the COVID-19 Pandemic (e.g. social distancing) in an effort to limit this patient's exposure and mitigate transmission in our community.  Due to her co-morbid illnesses, this patient is at least at moderate risk for complications without adequate follow up.  This format is felt to be most appropriate for this patient at this time.  All issues noted in this document were discussed and addressed.  A limited physical exam was performed with this format.  Please refer to the patient's chart for her consent to telehealth for [facility].   Date:  07/15/2019   ID:  Felicia Rivas, DOB 12/28/1955, MRN [id number]  Patient Location:Home Provider Location: Home  PCP:  [name], MD  Cardiologist:  Dr Jens Som  Evaluation Performed:  Follow-Up Visit  Chief Complaint:  FU CAD  History of Present Illness:    FU CAD and diastolic CHF. She is status post CABG 08/2011.Patient had carotid stent placed July 2018.Nuclear study December 2019 showed ejection fraction 47%, reversible medium size inferolateral defect suggestive of ischemia. Treated medically. Patient with acute on chronic diastolic congestive heart failure March 2020. Lasix was increased. Last echocardiogram July 2020 showed normal LV function, moderate left ventricular hypertrophy, grade 2 diastolic dysfunction and moderate left atrial enlargement.  Patient had arteriogram July 2020 that showed an occluded SFA on the right.  She had stents placed at that time.  Carotid Dopplers October 2020 showed 40 to 59% right and 1 to 39% left stenosis. Since she was last seen,she is confined to her wheelchair because of previous leg injury/fracture.  She denies increased dyspnea.  No chest pain or syncope.  The patient does not have symptoms concerning for COVID-19 infection (fever, chills, cough, or new shortness of breath).     Past Medical History:  Diagnosis Date  . Asthma   . Asthma   . CAD (coronary artery disease)    a. NSTEMI 12/12 -> s/p CABGx3.   . Carotid artery disease (HCC)    a. Carotid US (08/2013): R 60-79%; L 40-59%; f/u 6 mos.  . Carotid stenosis    a. Carotid US (08/2013):  R 60-79%; L 40-59%; f/u 6 mos  . Chronic diastolic CHF (congestive heart failure) (HCC)    a. Echo 08/2011: mod LVH, nl EF. b. Echo 01/2013: EF 55-60%, mod-sev LVH, mod-sev LA dilitation  . Chronic diastolic heart failure (HCC)    a. Echo 08/2011: mod LVH, nl EF. b. Echo 01/2013: EF 55-60%, mod-sev LVH, mod-sev LA dilitation.  . Complication of anesthesia    '' Myself & my family wake up during surgery "  . Complication of anesthesia   . Coronary artery disease    a. NSTEMI 12/12 -> s/p CABGx3.  [name] Cough   . Cough    a. 2014: Suspect a combination of CHF, GERD and ACE inhibitor-induced cough.  . Diabetes mellitus   . Diabetes mellitus (HCC)   . History of chicken pox   . HLD (hyperlipidemia)   . HTN (hypertension)   . Hyperlipidemia   . Hypertension   . Morbid obesity (HCC)   . Myocardial infarction (HCC)   . PONV (postoperative nausea and vomiting)   . S/P CABG (coronary artery bypass graft)    a. 2012 - Dr. Laneta Simmers;  LIMA-LAD, SVG-OM 2, SVG-PDA.  . S/P CABG x 3 09/07/11   Dr. Laneta Simmers;   LIMA-LAD, SVG-OM 2, SVG-PDA.  [name]  Sleep disturbance    a. Had sleep study several years ago, wasn't told she has OSA but told she needs O2 at night.  . Sleep disturbance    Had sleep study several years ago, wasn't told she has OSA but told she needs O2 at night  . Stroke ([place])   . Stroke [facility]) 2006   denies residual  . TIA (transient ischemic attack)    Past Surgical History:  Procedure Laterality Date  . ABDOMINAL AORTOGRAM W/LOWER EXTREMITY Bilateral 04/10/2019   Procedure: ABDOMINAL AORTOGRAM W/LOWER EXTREMITY;  Surgeon: Marty Heck, MD;  Location: [place] CV LAB;  Service: Cardiovascular;  Laterality:  Bilateral;  . CAROTID ANGIOGRAPHY N/A 03/17/2017   Procedure: Carotid Angiography;  Surgeon: Elam Dutch, MD;  Location: [place] CV LAB;  Service: Cardiovascular;  Laterality: N/A;  . CAROTID PTA/STENT INTERVENTION Right 03/29/2017   Procedure: Carotid PTA/Stent Intervention;  Surgeon: Elam Dutch, MD;  Location: [place] CV LAB;  Service: Cardiovascular;  Laterality: Right;  . CHOLECYSTECTOMY    . CHOLECYSTECTOMY  1990's  . CORONARY ARTERY BYPASS GRAFT    . CORONARY ARTERY BYPASS GRAFT  09/07/2011   CABG X3; Procedure: CORONARY ARTERY BYPASS GRAFTING (CABG);  Surgeon: Gaye Pollack, MD;  Location: [place];  Service: Open Heart Surgery;  Laterality: N/A;  . LEFT HEART CATHETERIZATION WITH CORONARY ANGIOGRAM N/A 09/02/2011   Procedure: LEFT HEART CATHETERIZATION WITH CORONARY ANGIOGRAM;  Surgeon: Hillary Bow, MD;  Location[facility] C[facility] LAB;  Service: Cardiovascular;  Laterality: N/A;  . PERIPHERAL VASCULAR INTERVENTION Right 04/10/2019   Procedure: PERIPHERAL VASCULAR INTERVENTION;  Surgeon: Marty Heck, MD;  Location: [place] CV LAB;  Service: Cardiovascular;  Laterality: Right;  superficial femoral artery  . TUBAL LIGATION  1981     Current Meds  Medication Sig  . albuterol (PROVENTIL HFA;VENTOLIN HFA) 108 (90 BASE) MCG/ACT inhaler Inhale 2 puffs into the lungs every 6 (six) hours as needed for wheezing or shortness of breath.  [name] aspirin EC 81 MG tablet Take 1 tablet (81 mg total) by mouth daily.  [name] atorvastatin (LIPITOR) 80 MG tablet Take 1 tablet (80 mg total) by mouth daily.  . budesonide-formoterol (SYMBICORT) 160-4.5 MCG/ACT inhaler Inhale 2 puffs into the lungs 2 (two) times daily as needed (SOB).   [name] clopidogrel (PLAVIX) 75 MG tablet Take 1 tablet (75 mg total) by mouth daily.  [name] gabapentin (NEURONTIN) 300 MG capsule Take three capsules (900 mg dose) by mouth 3 (three) times a day.  . insulin regular human CONCENTRATED (HUMULIN R) 500 UNIT/ML injection  Inject 10-20 Units into the skin See admin instructions. Inject 20 units in the morning (8AM) and 10-15units at night (10PM) with additional as needed. *Max daily dosage 65units/day  . Liraglutide 18 MG/3ML SOPN Inject 1.8 mg into the skin daily.  . metoprolol succinate (TOPROL-XL) 50 MG 24 hr tablet Take 1 tablet (50 mg total) by mouth daily. Take with or immediately following a meal.  . OXYGEN Inhale 2.5 L/day into the lungs continuous.   . pantoprazole (PROTONIX) 40 MG tablet Take 1 tablet (40 mg total) by mouth daily at 12 noon.  . potassium chloride SA (K-DUR) 20 MEQ tablet Take 2 tablets (40 mEq total) by mouth 2 (two) times daily.  [name] torsemide (DEMADEX) 20 MG tablet take 2 TABLETS (40 mg) EVERY MORNING and 1 TABLET (20 mg) EVERY EVENING]  . VITAMIN D, CHOLECALCIFEROL, PO Take 1 tablet by mouth daily.  . [DISCONTINUED] oxyCODONE-acetaminophen (PERCOCET/ROXICET)  5-325 MG tablet Take 1 tablet by mouth 2 (two) times daily as needed for severe pain.     Allergies:   Lisinopril, Other, and Tramadol   Social History   Tobacco Use  . Smoking status: Former Smoker    Packs/day: 0.50    Years: 25.00    Pack years: 12.50    Types: Cigarettes    Quit date: 11/11/2011    Years since quitting: 7.6  . Smokeless tobacco: Former Engineer, water Use Topics  . Alcohol use: No  . Drug use: No     Family Hx: The patient's family history includes Cancer in her mother; Coronary artery disease in her father; Diabetes in her mother; Heart disease in an other family member; Heart failure in her mother.  ROS:   Please see the history of present illness.    No Fever, chills  or productive cough; back pain and leg pain noted. All other systems reviewed and are negative.   Recent Labs: 07/17/2018: NT-Pro BNP 399 03/26/2019: B Natriuretic Peptide 85.4 03/27/2019: ALT 19; TSH 2.747 04/05/2019: Magnesium 2.7; Platelets 274 04/10/2019: Hemoglobin 9.2 05/08/2019: BUN 18; Creatinine, Ser 1.26; Potassium 4.9;  Sodium 139   Recent Lipid Panel Lab Results  Component Value Date/Time   CHOL 139 10/16/2017 08:14 AM   TRIG 127 10/16/2017 08:14 AM   HDL 34 (L) 10/16/2017 08:14 AM   CHOLHDL 4.1 10/16/2017 08:14 AM   CHOLHDL 4.0 11/16/2016 03:03 PM   LDLCALC 80 10/16/2017 08:14 AM    Wt Readings from Last 3 Encounters:  07/15/19 (!) 327 lb (148.3 kg)  04/24/19 (!) 311 lb (141.1 kg)  04/10/19 (!) 312 lb (141.5 kg)     Objective:    Vital Signs:  BP 125/67   Pulse 63   Ht 5\' 6"  (1.676 m)   Wt (!) 327 lb (148.3 kg)   BMI 52.78 kg/m    VITAL SIGNS:  reviewed NAD Answers questions appropriately Normal affect Remainder of physical examination not performed (telehealth visit; coronavirus pandemic)  ASSESSMENT & PLAN:    1. Coronary artery disease-patient is not having chest pain.  Continue medical therapy with aspirin and statin. 2. Chronic diastolic congestive heart failure-she appears to be doing well from a symptomatic standpoint.  Continue Demadex at present dose. 3. Chronic stage III kidney disease-followed by nephrology 4. Hypertension-blood pressure is controlled today.  Continue present medications and follow. 5. Peripheral vascular disease-followed by vascular surgery.  Continue medical therapy with aspirin and statin. 6. Hyperlipidemia-continue statin. 7. Morbid obesity 8. Preoperative evaluation prior to leg surgery-patient will require surgery from her previous ankle fracture.  Functional capacity is difficult to assess as she cannot ambulate because of her ankle.  Previous nuclear study did show some ischemia but confined to 1 vascular territory.  She is not having chest pain.  I explained that given her multiple comorbidities including previous heart disease, diastolic congestive heart failure, renal insufficiency she would be higher risk than normal but we would not pursue further cardiac testing preoperatively.  I agree that if general anesthesia could be avoided a local with  sedation would be preferable. 9. Carotid artery disease-she will need follow-up carotid Dopplers October 2021.  COVID-19 Education: The importance of social distancing was discussed today.  Time:   Today, I have spent 17 minutes with the patient with telehealth technology discussing the above problems.     Medication Adjustments/Labs and Tests Ordered: Current medicines are reviewed at length with the patient today.  Concerns  regarding medicines are outlined above.   Tests Ordered: No orders of the defined types were placed in this encounter.   Medication Changes: No orders of the defined types were placed in this encounter.   Follow Up:  Either In Person or Virtual in 6 month(s)  Signed, Olga MillersBrian Filippa Yarbough, MD  07/15/2019 10:19 AM    [facility]

## 2019-07-13 DIAGNOSIS — S82891P Other fracture of right lower leg, subsequent encounter for closed fracture with malunion: Secondary | ICD-10-CM | POA: Insufficient documentation

## 2019-07-15 ENCOUNTER — Telehealth (INDEPENDENT_AMBULATORY_CARE_PROVIDER_SITE_OTHER): Payer: BC Managed Care – PPO | Admitting: Cardiology

## 2019-07-15 ENCOUNTER — Encounter: Payer: Self-pay | Admitting: Cardiology

## 2019-07-15 VITALS — BP 125/67 | HR 63 | Ht 66.0 in | Wt 327.0 lb

## 2019-07-15 DIAGNOSIS — I25810 Atherosclerosis of coronary artery bypass graft(s) without angina pectoris: Secondary | ICD-10-CM

## 2019-07-15 DIAGNOSIS — I6523 Occlusion and stenosis of bilateral carotid arteries: Secondary | ICD-10-CM

## 2019-07-15 DIAGNOSIS — I10 Essential (primary) hypertension: Secondary | ICD-10-CM | POA: Diagnosis not present

## 2019-07-15 DIAGNOSIS — I5032 Chronic diastolic (congestive) heart failure: Secondary | ICD-10-CM | POA: Diagnosis not present

## 2019-07-15 NOTE — Patient Instructions (Signed)
Medication Instructions:  NO CHANGE *If you need a refill on your cardiac medications before your next appointment, please call your pharmacy*  Lab Work: If you have labs (blood work) drawn today and your tests are completely normal, you will receive your results only by: . MyChart Message (if you have MyChart) OR . A paper copy in the mail If you have any lab test that is abnormal or we need to change your treatment, we will call you to review the results.  Follow-Up: At [facility], you and your health needs are our priority.  As part of our continuing mission to provide you with exceptional heart care, we have created designated Provider Care Teams.  These Care Teams include your primary Cardiologist (physician) and Advanced Practice Providers (APPs -  Physician Assistants and Nurse Practitioners) who all work together to provide you with the care you need, when you need it.  Your next appointment:   6 month(s)  The format for your next appointment:   In Person  Provider:   Brian Crenshaw, MD   

## 2019-07-26 DIAGNOSIS — Z981 Arthrodesis status: Secondary | ICD-10-CM | POA: Insufficient documentation

## 2019-08-05 ENCOUNTER — Ambulatory Visit: Payer: BC Managed Care – PPO | Admitting: Cardiology

## 2019-11-10 DIAGNOSIS — I739 Peripheral vascular disease, unspecified: Secondary | ICD-10-CM | POA: Insufficient documentation

## 2019-11-14 DIAGNOSIS — B9620 Unspecified Escherichia coli [E. coli] as the cause of diseases classified elsewhere: Secondary | ICD-10-CM | POA: Insufficient documentation

## 2019-11-29 ENCOUNTER — Other Ambulatory Visit: Payer: Self-pay | Admitting: Cardiology

## 2020-01-13 ENCOUNTER — Telehealth: Payer: Self-pay | Admitting: Cardiology

## 2020-01-13 NOTE — Telephone Encounter (Signed)
Left message to call back  

## 2020-01-13 NOTE — Progress Notes (Signed)
Virtual Visit via Video Note   This visit type was conducted due to national recommendations for restrictions regarding the COVID-19 Pandemic (e.g. social distancing) in an effort to limit this patient's exposure and mitigate transmission in our community.  Due to her co-morbid illnesses, this patient is at least at moderate risk for complications without adequate follow up.  This format is felt to be most appropriate for this patient at this time.  All issues noted in this document were discussed and addressed.  A limited physical exam was performed with this format.  Please refer to the patient's chart for her consent to telehealth for [facility]   Date:  01/14/2020   ID:  Felicia Rivas, DOB 03/27/1956, MRN [id number]  Patient Location: Home Provider Location: Home  PCP:  [name], MD  Cardiologist:  Olga Millers, MD  Electrophysiologist:  None   Evaluation Performed:  Follow-Up Visit  Chief Complaint:  Follow up  History of Present Illness:    Felicia Rivas is a 64 y.o. female we are following for ongoing assessment and management of CAD, with CABG in 08/2011, Nuclear study December 2019 showed ejection fraction 47%, reversible medium size inferolateral defect suggestive of ischemia. Treated medically.  Patient with acute on chronic diastolic congestive heart failure March 2020. Lasix was increased.Last echocardiogram July 2020 showed normal LV function, moderate left ventricular hypertrophy, grade 2 diastolic dysfunction and moderate left atrial enlargement. Patient had arteriogram July 2020 that showed an occluded SFA on the right. She had stents placed at that time.  Carotid Dopplers October 2020 showed 40 to 59% right and 1 to 39% left stenosis.Since she was last seen,she is confined to her wheelchair because of previous leg injury/fracture  On last office visit dated 07/15/2019 she was stable from a CV standpoint. She was continued on medication regimen and  to be followed by vascular surgery. She was in a wheel chair due to ankle fracture.Was planned to have surgical repair. Local anesthesia was recommended due to cardiovascular risk if possible.   She was seen in the ED on 11/10/2019 for hypertension. Has had surgical repair of her ankle fracture by Dr.Vogal on 07/26/2019 and is being seen on follow up by orthopedics with home PT and OT. She is now on O2 via [place] all the time due to chronically decreased O2 saturations.    The patient does not have symptoms concerning for COVID-19 infection (fever, chills, cough, or new shortness of breath).    Past Medical History:  Diagnosis Date  . Asthma   . Asthma   . CAD (coronary artery disease)    a. NSTEMI 12/12 -> s/p CABGx3.   . Carotid artery disease (HCC)    a. Carotid US (08/2013): R 60-79%; L 40-59%; f/u 6 mos.  . Carotid stenosis    a. Carotid US (08/2013):  R 60-79%; L 40-59%; f/u 6 mos  . Chronic diastolic CHF (congestive heart failure) (HCC)    a. Echo 08/2011: mod LVH, nl EF. b. Echo 01/2013: EF 55-60%, mod-sev LVH, mod-sev LA dilitation  . Chronic diastolic heart failure (HCC)    a. Echo 08/2011: mod LVH, nl EF. b. Echo 01/2013: EF 55-60%, mod-sev LVH, mod-sev LA dilitation.  . Complication of anesthesia    '' Myself & my family wake up during surgery "  . Complication of anesthesia   . Coronary artery disease    a. NSTEMI 12/12 -> s/p CABGx3.  [name] Cough   . Cough  a. 2014: Suspect a combination of CHF, GERD and ACE inhibitor-induced cough.  . Diabetes mellitus   . Diabetes mellitus (HCC)   . History of chicken pox   . HLD (hyperlipidemia)   . HTN (hypertension)   . Hyperlipidemia   . Hypertension   . Morbid obesity (HCC)   . Myocardial infarction (HCC)   . PONV (postoperative nausea and vomiting)   . S/P CABG (coronary artery bypass graft)    a. 2012 - Dr. Laneta Simmers;  LIMA-LAD, SVG-OM 2, SVG-PDA.  . S/P CABG x 3 09/07/11   Dr. Laneta Simmers;   LIMA-LAD, SVG-OM 2, SVG-PDA.  . Sleep  disturbance    a. Had sleep study several years ago, wasn't told she has OSA but told she needs O2 at night.  . Sleep disturbance    Had sleep study several years ago, wasn't told she has OSA but told she needs O2 at night  . Stroke (HCC)   . Stroke [facility]) 2006   denies residual  . TIA (transient ischemic attack)    Past Surgical History:  Procedure Laterality Date  . ABDOMINAL AORTOGRAM W/LOWER EXTREMITY Bilateral 04/10/2019   Procedure: ABDOMINAL AORTOGRAM W/LOWER EXTREMITY;  Surgeon: Cephus Shelling, MD;  Location: [facility] INVASIVE CV LAB;  Service: Cardiovascular;  Laterality: Bilateral;  . CAROTID ANGIOGRAPHY N/A 03/17/2017   Procedure: Carotid Angiography;  Surgeon: Sherren Kerns, MD;  Location: [facility] INVASIVE CV LAB;  Service: Cardiovascular;  Laterality: N/A;  . CAROTID PTA/STENT INTERVENTION Right 03/29/2017   Procedure: Carotid PTA/Stent Intervention;  Surgeon: Sherren Kerns, MD;  Location: [facility] INVASIVE CV LAB;  Service: Cardiovascular;  Laterality: Right;  . CHOLECYSTECTOMY    . CHOLECYSTECTOMY  1990's  . CORONARY ARTERY BYPASS GRAFT    . CORONARY ARTERY BYPASS GRAFT  09/07/2011   CABG X3; Procedure: CORONARY ARTERY BYPASS GRAFTING (CABG);  Surgeon: Alleen Borne, MD;  Location: [facility] OR;  Service: Open Heart Surgery;  Laterality: N/A;  . LEFT HEART CATHETERIZATION WITH CORONARY ANGIOGRAM N/A 09/02/2011   Procedure: LEFT HEART CATHETERIZATION WITH CORONARY ANGIOGRAM;  Surgeon: Herby Abraham, MD;  Location: [facility] CATH LAB;  Service: Cardiovascular;  Laterality: N/A;  . PERIPHERAL VASCULAR INTERVENTION Right 04/10/2019   Procedure: PERIPHERAL VASCULAR INTERVENTION;  Surgeon: Cephus Shelling, MD;  Location: [facility] INVASIVE CV LAB;  Service: Cardiovascular;  Laterality: Right;  superficial femoral artery  . TUBAL LIGATION  1981     Current Meds  Medication Sig  . albuterol (PROVENTIL HFA;VENTOLIN HFA) 108 (90 BASE) MCG/ACT inhaler Inhale 2 puffs into the lungs every 6 (six) hours  as needed for wheezing or shortness of breath.  [name] aspirin EC 81 MG tablet Take 1 tablet (81 mg total) by mouth daily.  [name] atorvastatin (LIPITOR) 80 MG tablet Take 1 tablet (80 mg total) by mouth daily.  . budesonide-formoterol (SYMBICORT) 160-4.5 MCG/ACT inhaler Inhale 2 puffs into the lungs 2 (two) times daily as needed (SOB).   [name] clopidogrel (PLAVIX) 75 MG tablet Take 1 tablet (75 mg total) by mouth daily.  [name] gabapentin (NEURONTIN) 300 MG capsule Take three capsules (900 mg dose) by mouth 3 (three) times a day.  . insulin regular human CONCENTRATED (HUMULIN R) 500 UNIT/ML injection Inject 10-20 Units into the skin See admin instructions. Inject 20 units in the morning (8AM) and 10-15units at night (10PM) with additional as needed. *Max daily dosage 65units/day  . Liraglutide 18 MG/3ML SOPN Inject 1.8 mg into the skin daily.  . metoprolol succinate (TOPROL-XL) 50 MG  24 hr tablet Take 1 tablet (50 mg total) by mouth daily. Take with or immediately following a meal.  . OXYGEN Inhale 2.5 L/day into the lungs continuous.   . pantoprazole (PROTONIX) 40 MG tablet Take 1 tablet (40 mg total) by mouth daily at 12 noon.  . potassium chloride SA (KLOR-CON) 20 MEQ tablet Take 2 tablets (40 mEq total) by mouth daily.  [name] torsemide (DEMADEX) 20 MG tablet take 2 TABLETS (40 mg) EVERY MORNING and 1 TABLET (20 mg) EVERY EVENING]  . VITAMIN D, CHOLECALCIFEROL, PO Take 1 tablet by mouth daily.  . [DISCONTINUED] atorvastatin (LIPITOR) 80 MG tablet Take 1 tablet (80 mg total) by mouth daily.  . [DISCONTINUED] clopidogrel (PLAVIX) 75 MG tablet Take 1 tablet (75 mg total) by mouth daily.  . [DISCONTINUED] torsemide (DEMADEX) 20 MG tablet take 2 TABLETS (40 mg) EVERY MORNING and 1 TABLET (20 mg) EVERY EVENING]     Allergies:   Lisinopril, Other, and Tramadol   Social History   Tobacco Use  . Smoking status: Former Smoker    Packs/day: 0.50    Years: 25.00    Pack years: 12.50    Types: Cigarettes    Quit date:  11/11/2011    Years since quitting: 8.1  . Smokeless tobacco: Former Network engineer Use Topics  . Alcohol use: No  . Drug use: No     Family Hx: The patient's family history includes Cancer in her mother; Coronary artery disease in her father; Diabetes in her mother; Heart disease in an other family member; Heart failure in her mother.  ROS:   Please see the history of present illness.    All other systems reviewed and are negative.   Prior CV studies:   The following studies were reviewed today: Echocardiogram 03/27/2019 1. The left ventricle has normal systolic function, with an ejection  fraction of 55-60%. The cavity size was mildly dilated. There is  moderately increased left ventricular wall thickness. Left ventricular  diastolic Doppler parameters are consistent with  pseudonormalization. Elevated  2. The right ventricle has normal systolic function. The cavity was  normal. There is no increase in right ventricular wall thickness.  3. The mitral valve is grossly normal. There is mild mitral annular  calcification present. Trivial MR.  4. The aortic valve was not well visualized.  5. Left atrial size was moderately dilated  Labs/Other Tests and Data Reviewed:    EKG:  Not completed as this a virtual visit.  Recent Labs: 03/26/2019: B Natriuretic Peptide 85.4 03/27/2019: ALT 19; TSH 2.747 04/05/2019: Magnesium 2.7; Platelets 274 04/10/2019: Hemoglobin 9.2 05/08/2019: BUN 18; Creatinine, Ser 1.26; Potassium 4.9; Sodium 139   Recent Lipid Panel Lab Results  Component Value Date/Time   CHOL 139 10/16/2017 08:14 AM   TRIG 127 10/16/2017 08:14 AM   HDL 34 (L) 10/16/2017 08:14 AM   CHOLHDL 4.1 10/16/2017 08:14 AM   CHOLHDL 4.0 11/16/2016 03:03 PM   LDLCALC 80 10/16/2017 08:14 AM    Wt Readings from Last 3 Encounters:  01/14/20 300 lb (136.1 kg)  07/15/19 (!) 327 lb (148.3 kg)  04/24/19 (!) 311 lb (141.1 kg)     Objective:    Vital Signs:  BP (!) 151/71   Pulse  72   Ht 5\' 6"  (1.676 m)   Wt 300 lb (136.1 kg)   SpO2 98%   BMI 48.42 kg/m    VITAL SIGNS:  reviewed GEN:  no acute distress EYES:  sclerae anicteric, EOMI -  Extraocular Movements Intact RESPIRATORY:  Wearing O2 via [place], Some dyspnea with excessive speaking.  MUSCULOSKELETAL:  Dressing to right ankle with brace. Not weight bearing. NEURO:  alert and oriented x 3, no obvious focal deficit PSYCH:  normal affect  ASSESSMENT & PLAN:    1. Chronic diastolic CHF: She appears euvolemic. Does not know what her dry weight is as she has lost about 40 lbs from recent surgery due weakness and nausea post operatively. She is eating and drinking but not active. I will continue her medications, but check BMET to evaluate her kidney function torsemide.  2. CAD: Hx of CABG with most recent NM study revealing reversible moderate sized inferolateral defect. She is being treated medically with DAPT, BB and statin therapy,  3. PAD: Stents to the right SFA, placed July of 2020. Continue DAPT  4. Right Ankle Fracture: Followed by Dr. Petra Kuba. She is not weight bearing yet. Being seen by OT and PT now, but will need clearance for weight bearing from Dr.Vogel to progress with PT as she has become very deconditioned. [name]   COVID-19 Education: The signs and symptoms of COVID-19 were discussed with the patient and how to seek care for testing (follow up with PCP or arrange E-visit). The importance of social distancing was discussed today.  Time:   Today, I have spent 15 minutes with the patient with telehealth technology discussing the above problems.     Medication Adjustments/Labs and Tests Ordered: Current medicines are reviewed at length with the patient today.  Concerns regarding medicines are outlined above.   Tests Ordered: Orders Placed This Encounter  Procedures  . CBC  . Basic Metabolic Panel (BMET)    Medication Changes: Meds ordered this encounter  Medications  . atorvastatin (LIPITOR) 80 MG  tablet    Sig: Take 1 tablet (80 mg total) by mouth daily.    Dispense:  90 tablet    Refill:  3  . clopidogrel (PLAVIX) 75 MG tablet    Sig: Take 1 tablet (75 mg total) by mouth daily.    Dispense:  90 tablet    Refill:  3  . torsemide (DEMADEX) 20 MG tablet    Sig: take 2 TABLETS (40 mg) EVERY MORNING and 1 TABLET (20 mg) EVERY EVENING]    Dispense:  180 tablet    Refill:  3    Disposition:  Follow up 3 months with Dr Jens Som. Patient requests in person.  Signed, Bettey Mare. Liborio Nixon, ANP, AACC  01/14/2020 9:05 AM    [facility]

## 2020-01-13 NOTE — Telephone Encounter (Signed)
Patient is requesting to have paperwork completed by Dr. Jens Som for the [facility]. Please call to assist.

## 2020-01-14 ENCOUNTER — Telehealth (INDEPENDENT_AMBULATORY_CARE_PROVIDER_SITE_OTHER): Payer: BC Managed Care – PPO | Admitting: Adult Health

## 2020-01-14 ENCOUNTER — Encounter: Payer: Self-pay | Admitting: Adult Health

## 2020-01-14 VITALS — BP 151/71 | HR 72 | Ht 66.0 in | Wt 300.0 lb

## 2020-01-14 DIAGNOSIS — Z79899 Other long term (current) drug therapy: Secondary | ICD-10-CM | POA: Diagnosis not present

## 2020-01-14 DIAGNOSIS — I739 Peripheral vascular disease, unspecified: Secondary | ICD-10-CM

## 2020-01-14 DIAGNOSIS — I5032 Chronic diastolic (congestive) heart failure: Secondary | ICD-10-CM | POA: Diagnosis not present

## 2020-01-14 DIAGNOSIS — Z951 Presence of aortocoronary bypass graft: Secondary | ICD-10-CM

## 2020-01-14 DIAGNOSIS — I10 Essential (primary) hypertension: Secondary | ICD-10-CM

## 2020-01-14 DIAGNOSIS — I6523 Occlusion and stenosis of bilateral carotid arteries: Secondary | ICD-10-CM

## 2020-01-14 DIAGNOSIS — E7800 Pure hypercholesterolemia, unspecified: Secondary | ICD-10-CM

## 2020-01-14 MED ORDER — TORSEMIDE 20 MG PO TABS: ORAL_TABLET | ORAL | 3 refills | Status: DC

## 2020-01-14 MED ORDER — ATORVASTATIN CALCIUM 80 MG PO TABS: 80.0000 mg | ORAL_TABLET | Freq: Every day | ORAL | 3 refills | Status: AC

## 2020-01-14 MED ORDER — CLOPIDOGREL BISULFATE 75 MG PO TABS: 75.0000 mg | ORAL_TABLET | Freq: Every day | ORAL | 3 refills | Status: AC

## 2020-01-14 NOTE — Patient Instructions (Signed)
Medication Instructions:  Continue current medications  *If you need a refill on your cardiac medications before your next appointment, please call your pharmacy*   Lab Work: CBC and BMP  If you have labs (blood work) drawn today and your tests are completely normal, you will receive your results only by: [name] MyChart Message (if you have MyChart) OR . A paper copy in the mail If you have any lab test that is abnormal or we need to change your treatment, we will call you to review the results.   Testing/Procedures: None ordered   Follow-Up: At [facility], you and your health needs are our priority.  As part of our continuing mission to provide you with exceptional heart care, we have created designated Provider Care Teams.  These Care Teams include your primary Cardiologist (physician) and Advanced Practice Providers (APPs -  Physician Assistants and Nurse Practitioners) who all work together to provide you with the care you need, when you need it.  We recommend signing up for the patient portal called "MyChart".  Sign up information is provided on this After Visit Summary.  MyChart is used to connect with patients for Virtual Visits (Telemedicine).  Patients are able to view lab/test results, encounter notes, upcoming appointments, etc.  Non-urgent messages can be sent to your provider as well.   To learn more about what you can do with MyChart, go to [email or web address].    Your next appointment:   4 month(s)  The format for your next appointment:   In Person  Provider:   You may see Olga Millers, MD or one of the following Advanced Practice Providers on your designated Care Team:    Corine Shelter, PA-C  [place], [place]  Edd Fabian, [place]

## 2020-01-24 NOTE — Telephone Encounter (Signed)
Routed to primary nurse. Patient never returned call. Not sure if MD will complete DMV placard paperwork.

## 2020-01-27 NOTE — Telephone Encounter (Signed)
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 She will place the paperwork in the mail.

## 2020-03-05 ENCOUNTER — Telehealth: Payer: Self-pay | Admitting: Cardiology

## 2020-03-05 NOTE — Telephone Encounter (Signed)
Agree with APP ov Olga Millers

## 2020-03-05 NOTE — Telephone Encounter (Signed)
Received Pt Schedule request. She is scheduled 03/12/2020 for a virtual appointment. Patient's message stated:  Felicia Rivas is not breathing well at all and [facility] told her that she had edema on her lungs. Her kidney blood work looks worse to me? Mama is still bed bound so would need video or schedule transport.

## 2020-03-05 NOTE — Telephone Encounter (Signed)
Returned call to patient concerning shortness of breath. Patient states she was having to using her inhaler more, which was unusual so she went to [facility] hospital and was diagnosed with UTI, was told something was wrong with her kidneys, and was told her CXR showed edema on her lungs. She states she was given too much IV fluids so was then given IV lasix. She is unsure if the fluid is off her lungs. She is also on antibiotics (previously clindamycin for leg/ankle surgery/infection for 8 weeks and now on ceftin after UTI diagnosis). She was told antibiotics may be impacting kidney function and has a f/u with nephrology next week.  Patient states she feels OK, however daughter gets on the phone - states patient cannot breathe well, has to sit upright to breath, appetite decreased, has had to increase oxygen. Patient was not audibly SOB on the phone.  Patient was previously scheduled for VV with MD on 6/24. Advised she should be evaluated in office (last in office cardiology visit was 04/2019). She was been r/s for 6/25 with Verdon Cummins NP (first available)  Routed to MD/RN as Lorain Childes

## 2020-03-12 ENCOUNTER — Telehealth: Payer: BC Managed Care – PPO | Admitting: Cardiology

## 2020-03-12 DIAGNOSIS — N183 Chronic kidney disease, stage 3 unspecified: Secondary | ICD-10-CM | POA: Insufficient documentation

## 2020-03-12 NOTE — Progress Notes (Deleted)
Cardiology Clinic Note   Patient Name: Felicia Rivas Date of Encounter: 03/12/2020  Primary Care Provider:  Marjory Sneddon, MD Primary Cardiologist:  Kirk Ruths, MD  Patient Profile    ***  Past Medical History    Past Medical History:  Diagnosis Date  . Asthma   . Asthma   . CAD (coronary artery disease)    a. NSTEMI 12/12 -> s/p CABGx3.   . Carotid artery disease ([place])    a. Carotid US (08/2013): R 60-79%; L 40-59%; f/u 6 mos.  . Carotid stenosis    a. Carotid US (08/2013):  R 60-79%; L 40-59%; f/u 6 mos  . Chronic diastolic CHF (congestive heart failure) ([place])    a. Echo 08/2011: mod LVH, nl EF. b. Echo 01/2013: EF 55-60%, mod-sev LVH, mod-sev LA dilitation  . Chronic diastolic heart failure ([place])    a. Echo 08/2011: mod LVH, nl EF. b. Echo 01/2013: EF 55-60%, mod-sev LVH, mod-sev LA dilitation.  . Complication of anesthesia    '' Myself & my family wake up during surgery "  . Complication of anesthesia   . Coronary artery disease    a. NSTEMI 12/12 -> s/p CABGx3.  [name] Cough   . Cough    a. 2014: Suspect a combination of CHF, GERD and ACE inhibitor-induced cough.  . Diabetes mellitus   . Diabetes mellitus ([place])   . History of chicken pox   . HLD (hyperlipidemia)   . HTN (hypertension)   . Hyperlipidemia   . Hypertension   . Morbid obesity ([place])   . Myocardial infarction ([place])   . PONV (postoperative nausea and vomiting)   . S/P CABG (coronary artery bypass graft)    a. 2012 - Dr. Cyndia Bent;  LIMA-LAD, SVG-OM 2, SVG-PDA.  . S/P CABG x 3 09/07/11   Dr. Cyndia Bent;   LIMA-LAD, SVG-OM 2, SVG-PDA.  . Sleep disturbance    a. Had sleep study several years ago, wasn't told she has OSA but told she needs O2 at night.  . Sleep disturbance    Had sleep study several years ago, wasn't told she has OSA but told she needs O2 at night  . Stroke ([place])   . Stroke [facility]) 2006   denies residual  . TIA (transient ischemic attack)    Past Surgical History:  Procedure  Laterality Date  . ABDOMINAL AORTOGRAM W/LOWER EXTREMITY Bilateral 04/10/2019   Procedure: ABDOMINAL AORTOGRAM W/LOWER EXTREMITY;  Surgeon: Marty Heck, MD;  Location: [facility];  Service: Cardiovascular;  Laterality: Bilateral;  . CAROTID ANGIOGRAPHY N/A 03/17/2017   Procedure: Carotid Angiography;  Surgeon: Elam Dutch, MD;  Location: [place] [facility];  Service: Cardiovascular;  Laterality: N/A;  . CAROTID PTA/STENT INTERVENTION Right 03/29/2017   Procedure: Carotid PTA/Stent Intervention;  Surgeon: Elam Dutch, MD;  Location: [place] [facility];  Service: Cardiovascular;  Laterality: Right;  . CHOLECYSTECTOMY    . CHOLECYSTECTOMY  1990's  . CORONARY ARTERY BYPASS GRAFT    . CORONARY ARTERY BYPASS GRAFT  09/07/2011   CABG X3; Procedure: CORONARY ARTERY BYPASS GRAFTING (CABG);  Surgeon: Gaye Pollack, MD;  Location: [facility];  Service: Open Heart Surgery;  Laterality: N/A;  . LEFT HEART CATHETERIZATION WITH CORONARY ANGIOGRAM N/A 09/02/2011   Procedure: LEFT HEART CATHETERIZATION WITH CORONARY ANGIOGRAM;  Surgeon: Hillary Bow, MD;  Location: [facility] CATH LAB;  Service: Cardiovascular;  Laterality: N/A;  . PERIPHERAL VASCULAR INTERVENTION Right 04/10/2019   Procedure: PERIPHERAL VASCULAR INTERVENTION;  Surgeon: Cephus Shelling, MD;  Location: [facility] INVASIVE CV LAB;  Service: Cardiovascular;  Laterality: Right;  superficial femoral artery  . TUBAL LIGATION  1981    Allergies  Allergies  Allergen Reactions  . Lisinopril Cough, Nausea And Vomiting and Other (See Comments)    ACE Inhibitor Cough ?Cough - changed to ARB 02/2013. ACE Inhibitor Cough ACE Inhibitor Cough ?Cough - changed to ARB 02/2013. ACE Inhibitor Cough ?Cough - changed to ARB 02/2013.  [name] Other Hives    Unknown IV antibiotic  . Tramadol     Causes respiratory distress    History of Present Illness    ***  Home Medications    Prior to Admission medications   Medication Sig Start Date End  Date Taking? Authorizing Provider  albuterol (PROVENTIL HFA;VENTOLIN HFA) 108 (90 BASE) MCG/ACT inhaler Inhale 2 puffs into the lungs every 6 (six) hours as needed for wheezing or shortness of breath. 11/11/14   Laurann Montana, PA-C  aspirin EC 81 MG tablet Take 1 tablet (81 mg total) by mouth daily. 04/05/19   Darlin Drop, DO  atorvastatin (LIPITOR) 80 MG tablet Take 1 tablet (80 mg total) by mouth daily. 01/14/20   Jodelle Gross, NP  budesonide-formoterol [facility]) 160-4.5 MCG/ACT inhaler Inhale 2 puffs into the lungs 2 (two) times daily as needed (SOB).  10/22/18   [provider]  clopidogrel (PLAVIX) 75 MG tablet Take 1 tablet (75 mg total) by mouth daily. 01/14/20   Jodelle Gross, NP  gabapentin (NEURONTIN) 300 MG capsule Take three capsules (900 mg dose) by mouth 3 (three) times a day. 06/13/19   [provider]  insulin regular human CONCENTRATED (HUMULIN R) 500 UNIT/ML injection Inject 10-20 Units into the skin See admin instructions. Inject 20 units in the morning (8AM) and 10-15units at night (10PM) with additional as needed. *Max daily dosage 65units/day 02/20/17   [provider]  Liraglutide 18 MG/3ML SOPN Inject 1.8 mg into the skin daily.    [provider]  metoprolol succinate (TOPROL-XL) 50 MG 24 hr tablet Take 1 tablet (50 mg total) by mouth daily. Take with or immediately following a meal. 05/20/19   Kilroy, Eda Paschal, PA-C  OXYGEN Inhale 2.5 L/day into the lungs continuous.     [provider]  pantoprazole (PROTONIX) 40 MG tablet Take 1 tablet (40 mg total) by mouth daily at 12 noon. 04/05/19   Darlin Drop, DO  potassium chloride SA (KLOR-CON) 20 MEQ tablet Take 2 tablets (40 mEq total) by mouth daily. 11/29/19   Lewayne Bunting, MD  torsemide (DEMADEX) 20 MG tablet take 2 TABLETS (40 mg) EVERY MORNING and 1 TABLET (20 mg) EVERY EVENING] 01/14/20   Jodelle Gross, NP  VITAMIN D, CHOLECALCIFEROL, PO Take 1 tablet by mouth  daily.    [provider]    Family History    Family History  Problem Relation Age of Onset  . Cancer Mother   . Heart failure Mother        Also has hx MVP s/p MVR  . Diabetes Mother   . Coronary artery disease Father        Died of MI at age 31  . Heart disease Other        Parent   She indicated that her mother is alive. She indicated that her father is deceased. She indicated that only one of her two sisters is alive. She indicated that her brother is alive.  She indicated that her maternal grandmother is deceased. She indicated that her maternal grandfather is deceased. She indicated that her paternal grandmother is deceased. She indicated that her paternal grandfather is deceased. She indicated that only one of her two daughters is alive. She indicated that the status of her other is unknown.  Social History    Social History   Socioeconomic History  . Marital status: Married    Spouse name: Not on file  . Number of children: 3  . Years of education: 63  . Highest education level: Not on file  Occupational History  . 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 Smoking status: Former Smoker    Packs/day: 0.50    Years: 25.00    Pack years: 12.50    Types: Cigarettes    Quit date: 11/11/2011    Years since quitting: 8.3  . Smokeless tobacco: Former Clinical biochemist  . Vaping Use: Never used  Substance and Sexual Activity  . Alcohol use: No  . Drug use: No  . Sexual activity: Never  Other Topics Concern  . Not on file  Social History Narrative   ** Merged History Encounter **       Regular exercise-yes   Social Determinants of Health   Financial Resource Strain:   . Difficulty of Paying Living Expenses:   Food Insecurity:   . Worried About Programme researcher, broadcasting/film/video in the Last Year:   . Barista in the Last Year:   Transportation Needs:   . Freight forwarder (Medical):   [name] Lack of Transportation (Non-Medical):    Physical Activity:   . Days of Exercise per Week:   . Minutes of Exercise per Session:   Stress:   . Feeling of Stress :   Social Connections:   . Frequency of Communication with Friends and Family:   . Frequency of Social Gatherings with Friends and Family:   . Attends Religious Services:   . Active Member of Clubs or Organizations:   . Attends Banker Meetings:   [name] Marital Status:   Intimate Partner Violence:   . Fear of Current or Ex-Partner:   . Emotionally Abused:   [name] Physically Abused:   . Sexually Abused:      Review of Systems    General:  No chills, fever, night sweats or weight changes.  Cardiovascular:  No chest pain, dyspnea on exertion, edema, orthopnea, palpitations, paroxysmal nocturnal dyspnea. Dermatological: No rash, lesions/masses Respiratory: No cough, dyspnea Urologic: No hematuria, dysuria Abdominal:   No nausea, vomiting, diarrhea, bright red blood per rectum, melena, or hematemesis Neurologic:  No visual changes, wkns, changes in mental status. All other systems reviewed and are otherwise negative except as noted above.  Physical Exam    VS:  There were no vitals taken for this visit. , BMI There is no height or weight on file to calculate BMI. GEN: Well nourished, well developed, in no acute distress. HEENT: normal. Neck: Supple, no JVD, carotid bruits, or masses. Cardiac: RRR, no murmurs, rubs, or gallops. No clubbing, cyanosis, edema.  Radials/DP/PT 2+ and equal bilaterally.  Respiratory:  Respirations regular and unlabored, clear to auscultation bilaterally. GI: Soft, nontender, nondistended, BS + x 4. MS: no deformity or atrophy. Skin: warm and dry, no rash. Neuro:  Strength and sensation are intact. Psych: Normal affect.  Accessory Clinical Findings    ECG personally reviewed by me today- *** -  No acute changes  Assessment & Plan   1.  ***  Thomasene Ripple. Genee Rann NP-C    03/12/2020, 7:52 AM [facility]  [facility] [address] Office [phone] Fax [phone]

## 2020-03-13 ENCOUNTER — Ambulatory Visit: Payer: BC Managed Care – PPO | Admitting: General Practice

## 2020-04-10 NOTE — Progress Notes (Deleted)
HPI:FU CAD and diastolic CHF. She is status post CABG 08/2011.Patient had carotid stent placed July 2018.Nuclear study December 2019 showed ejection fraction 47%, reversible medium size inferolateral defect suggestive of ischemia. Treated medically. Patient with acute on chronic diastolic congestive heart failure March 2020. Lasix was increased.Last echocardiogram July 2020 showed normal LV function, moderate left ventricular hypertrophy, grade 2 diastolic dysfunction and moderate left atrial enlargement. Patient had arteriogram July 2020 that showed an occluded SFA on the right. She had stents placed at that time.  ABIs 10 20 normal. Carotid Dopplers October 2020 showed 40 to 59% right and 1 to 39% left stenosis.Since she was last seen,  Current Outpatient Medications  Medication Sig Dispense Refill  . albuterol (PROVENTIL HFA;VENTOLIN HFA) 108 (90 BASE) MCG/ACT inhaler Inhale 2 puffs into the lungs every 6 (six) hours as needed for wheezing or shortness of breath. 1 Inhaler 1  . aspirin EC 81 MG tablet Take 1 tablet (81 mg total) by mouth daily. 90 tablet 3  . atorvastatin (LIPITOR) 80 MG tablet Take 1 tablet (80 mg total) by mouth daily. 90 tablet 3  . budesonide-formoterol (SYMBICORT) 160-4.5 MCG/ACT inhaler Inhale 2 puffs into the lungs 2 (two) times daily as needed (SOB).     [name] clopidogrel (PLAVIX) 75 MG tablet Take 1 tablet (75 mg total) by mouth daily. 90 tablet 3  . gabapentin (NEURONTIN) 300 MG capsule Take three capsules (900 mg dose) by mouth 3 (three) times a day.    . insulin regular human CONCENTRATED (HUMULIN R) 500 UNIT/ML injection Inject 10-20 Units into the skin See admin instructions. Inject 20 units in the morning (8AM) and 10-15units at night (10PM) with additional as needed. *Max daily dosage 65units/day    . Liraglutide 18 MG/3ML SOPN Inject 1.8 mg into the skin daily.    . metoprolol succinate (TOPROL-XL) 50 MG 24 hr tablet Take 1 tablet (50 mg total) by  mouth daily. Take with or immediately following a meal. 90 tablet 3  . OXYGEN Inhale 2.5 L/day into the lungs continuous.     . pantoprazole (PROTONIX) 40 MG tablet Take 1 tablet (40 mg total) by mouth daily at 12 noon. 30 tablet 0  . potassium chloride SA (KLOR-CON) 20 MEQ tablet Take 2 tablets (40 mEq total) by mouth daily. 180 tablet 3  . torsemide (DEMADEX) 20 MG tablet take 2 TABLETS (40 mg) EVERY MORNING and 1 TABLET (20 mg) EVERY EVENING] 180 tablet 3  . VITAMIN D, CHOLECALCIFEROL, PO Take 1 tablet by mouth daily.     No current facility-administered medications for this visit.     Past Medical History:  Diagnosis Date  . Asthma   . Asthma   . CAD (coronary artery disease)    a. NSTEMI 12/12 -> s/p CABGx3.   . Carotid artery disease ([facility])    a. Carotid US (08/2013): R 60-79%; L 40-59%; f/u 6 mos.  . Carotid stenosis    a. Carotid US (08/2013):  R 60-79%; L 40-59%; f/u 6 mos  . Chronic diastolic CHF (congestive heart failure) ([facility])    a. Echo 08/2011: mod LVH, nl EF. b. Echo 01/2013: EF 55-60%, mod-sev LVH, mod-sev LA dilitation  . Chronic diastolic heart failure ([facility])    a. Echo 08/2011: mod LVH, nl EF. b. Echo 01/2013: EF 55-60%, mod-sev LVH, mod-sev LA dilitation.  . Complication of anesthesia    '' Myself & my family wake up during surgery "  . Complication of  anesthesia   . Coronary artery disease    a. NSTEMI 12/12 -> s/p CABGx3.  [name] Cough   . Cough    a. 2014: Suspect a combination of CHF, GERD and ACE inhibitor-induced cough.  . Diabetes mellitus   . Diabetes mellitus ([facility])   . History of chicken pox   . HLD (hyperlipidemia)   . HTN (hypertension)   . Hyperlipidemia   . Hypertension   . Morbid obesity ([facility])   . Myocardial infarction ([facility])   . PONV (postoperative nausea and vomiting)   . S/P CABG (coronary artery bypass graft)    a. 2012 - Dr. Laneta Simmers;  LIMA-LAD, SVG-OM 2, SVG-PDA.  . S/P CABG x 3 09/07/11   Dr. Laneta Simmers;   LIMA-LAD, SVG-OM 2, SVG-PDA.  . Sleep  disturbance    a. Had sleep study several years ago, wasn't told she has OSA but told she needs O2 at night.  . Sleep disturbance    Had sleep study several years ago, wasn't told she has OSA but told she needs O2 at night  . Stroke ([facility])   . Stroke [facility]) 2006   denies residual  . TIA (transient ischemic attack)     Past Surgical History:  Procedure Laterality Date  . ABDOMINAL AORTOGRAM W/LOWER EXTREMITY Bilateral 04/10/2019   Procedure: ABDOMINAL AORTOGRAM W/LOWER EXTREMITY;  Surgeon: Cephus Shelling, MD;  Location: [facility] INVASIVE CV LAB;  Service: Cardiovascular;  Laterality: Bilateral;  . CAROTID ANGIOGRAPHY N/A 03/17/2017   Procedure: Carotid Angiography;  Surgeon: Sherren Kerns, MD;  Location: [facility] INVASIVE CV LAB;  Service: Cardiovascular;  Laterality: N/A;  . CAROTID PTA/STENT INTERVENTION Right 03/29/2017   Procedure: Carotid PTA/Stent Intervention;  Surgeon: Sherren Kerns, MD;  Location: [facility] INVASIVE CV LAB;  Service: Cardiovascular;  Laterality: Right;  . CHOLECYSTECTOMY    . CHOLECYSTECTOMY  1990's  . CORONARY ARTERY BYPASS GRAFT    . CORONARY ARTERY BYPASS GRAFT  09/07/2011   CABG X3; Procedure: CORONARY ARTERY BYPASS GRAFTING (CABG);  Surgeon: Alleen Borne, MD;  Location: [facility] OR;  Service: Open Heart Surgery;  Laterality: N/A;  . LEFT HEART CATHETERIZATION WITH CORONARY ANGIOGRAM N/A 09/02/2011   Procedure: LEFT HEART CATHETERIZATION WITH CORONARY ANGIOGRAM;  Surgeon: Herby Abraham, MD;  Location: [facility] CATH LAB;  Service: Cardiovascular;  Laterality: N/A;  . PERIPHERAL VASCULAR INTERVENTION Right 04/10/2019   Procedure: PERIPHERAL VASCULAR INTERVENTION;  Surgeon: Cephus Shelling, MD;  Location: [facility] INVASIVE CV LAB;  Service: Cardiovascular;  Laterality: Right;  superficial femoral artery  . TUBAL LIGATION  1981    Social History   Socioeconomic History  . Marital status: Married    Spouse name: Not on file  . Number of children: 3  . Years of education: 87    . Highest education level: Not on file  Occupational History  . 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 Smoking status: Former Smoker    Packs/day: 0.50    Years: 25.00    Pack years: 12.50    Types: Cigarettes    Quit date: 11/11/2011    Years since quitting: 8.4  . Smokeless tobacco: Former Clinical biochemist  . Vaping Use: Never used  Substance and Sexual Activity  . Alcohol use: No  . Drug use: No  . Sexual activity: Never  Other Topics Concern  . Not on file  Social History Narrative   ** Merged History Encounter **  Regular exercise-yes   Social Determinants of Health   Financial Resource Strain:   . Difficulty of Paying Living Expenses:   Food Insecurity:   . Worried About Programme researcher, broadcasting/film/video in the Last Year:   . Barista in the Last Year:   Transportation Needs:   . Freight forwarder (Medical):   [name] Lack of Transportation (Non-Medical):   Physical Activity:   . Days of Exercise per Week:   . Minutes of Exercise per Session:   Stress:   . Feeling of Stress :   Social Connections:   . Frequency of Communication with Friends and Family:   . Frequency of Social Gatherings with Friends and Family:   . Attends Religious Services:   . Active Member of Clubs or Organizations:   . Attends Banker Meetings:   [name] Marital Status:   Intimate Partner Violence:   . Fear of Current or Ex-Partner:   . Emotionally Abused:   [name] Physically Abused:   . Sexually Abused:     Family History  Problem Relation Age of Onset  . Cancer Mother   . Heart failure Mother        Also has hx MVP s/p MVR  . Diabetes Mother   . Coronary artery disease Father        Died of MI at age 48  . Heart disease Other        Parent    ROS: no fevers or chills, productive cough, hemoptysis, dysphasia, odynophagia, melena, hematochezia, dysuria, hematuria, rash, seizure activity, orthopnea, PND, pedal edema, claudication. Remaining  systems are negative.  Physical Exam: Well-developed well-nourished in no acute distress.  Skin is warm and dry.  HEENT is normal.  Neck is supple.  Chest is clear to auscultation with normal expansion.  Cardiovascular exam is regular rate and rhythm.  Abdominal exam nontender or distended. No masses palpated. Extremities show no edema. neuro grossly intact  ECG- personally reviewed  A/P  1. Coronary artery disease-no CP.  Continue medical therapy including aspirin and statin. 2. Chronic diastolic congestive heart failure-euvolemic; continue demadex. 3. Chronic stage III kidney disease-followed by nephrology 4. Hypertension-blood pressure is controlled today.  Continue present medical regimen. 5. Peripheral vascular disease-followed by vascular surgery.  Continue aspirin and statin. 6. Hyperlipidemia-continue statin. 7. Morbid obesity-we discussed importance of diet and weight loss. 8. Carotid artery disease-she will need follow-up carotid Dopplers October 2021.  Olga Millers, MD

## 2020-04-14 NOTE — Telephone Encounter (Signed)
Appt cancelled

## 2020-04-15 ENCOUNTER — Ambulatory Visit: Payer: Medicare PPO | Admitting: Cardiology

## 2020-04-27 ENCOUNTER — Telehealth: Payer: Medicare PPO | Admitting: Cardiology

## 2020-04-27 ENCOUNTER — Telehealth: Payer: Self-pay | Admitting: Medical

## 2020-04-27 ENCOUNTER — Other Ambulatory Visit: Payer: Self-pay

## 2020-04-27 NOTE — Telephone Encounter (Signed)
   Received a call from [facility] with [facility] to report an abnormal potassium level. She drew labs from the patient's PICC line today which resulted a K of 6.2. She is on torsemide 40mg  qAM and 20mg  qPM with potassium 40 mEq daily. Encouraged ED visit for repeat BMET. If K remains elevated this can be managed at that time and she can get recommendations for how to take her potassium going forward. planned to relay this message to the patient. Anticipate close outpatient monitoring after ED visit.   , PA-C 04/27/20; 5:59 PM

## 2020-05-11 NOTE — Progress Notes (Signed)
Virtual Visit via Video Note changed to phone visit at patient request   This visit type was conducted due to national recommendations for restrictions regarding the COVID-19 Pandemic (e.g. social distancing) in an effort to limit this patient's exposure and mitigate transmission in our community.  Due to her co-morbid illnesses, this patient is at least at moderate risk for complications without adequate follow up.  This format is felt to be most appropriate for this patient at this time.  All issues noted in this document were discussed and addressed.  A limited physical exam was performed with this format.  Please refer to the patient's chart for her consent to telehealth for [facility].      Date:  05/19/2020   ID:  Felicia Rivas, DOB 1956-08-01, MRN [id number]  Patient Location:Home Provider Location: Home  PCP:  [name], MD  Cardiologist:  Dr Jens Som  Evaluation Performed:  Follow-Up Visit  Chief Complaint:  FU CAD  History of Present Illness:    FU CAD and diastolic CHF. She is status post CABG 08/2011.Patient had carotid stent placed July 2018. Nuclear study December 2019 showed ejection fraction 47%, reversible medium size inferolateral defect suggestive of ischemia. Treated medically. Patient had arteriogram July 2020 that showed an occluded SFA on the right. She had stents placed at that time. Carotid Dopplers October 2020 showed 40 to 59% right and 1 to 39% left stenosis.Echocardiogram at [facility] July 2021 showed ejection fraction 50 to 55%, mild to moderate left ventricular hypertrophy. Since she was last seen,she denies dyspnea, chest pain, palpitations or syncope.  She is in rehabilitation following problems with renal insufficiency and infection of her foot.  The patient does not have symptoms concerning for COVID-19 infection (fever, chills, cough, or new shortness of breath).    Past Medical History:  Diagnosis Date   Asthma    Asthma    CAD  (coronary artery disease)    a. NSTEMI 12/12 -> s/p CABGx3.    Carotid artery disease (HCC)    a. Carotid US (08/2013): R 60-79%; L 40-59%; f/u 6 mos.   Carotid stenosis    a. Carotid US (08/2013):  R 60-79%; L 40-59%; f/u 6 mos   Chronic diastolic CHF (congestive heart failure) (HCC)    a. Echo 08/2011: mod LVH, nl EF. b. Echo 01/2013: EF 55-60%, mod-sev LVH, mod-sev LA dilitation   Chronic diastolic heart failure (HCC)    a. Echo 08/2011: mod LVH, nl EF. b. Echo 01/2013: EF 55-60%, mod-sev LVH, mod-sev LA dilitation.   Complication of anesthesia    '' Myself & my family wake up during surgery "   Complication of anesthesia    Coronary artery disease    a. NSTEMI 12/12 -> s/p CABGx3.   Cough    Cough    a. 2014: Suspect a combination of CHF, GERD and ACE inhibitor-induced cough.   Diabetes mellitus    Diabetes mellitus (HCC)    History of chicken pox    HLD (hyperlipidemia)    HTN (hypertension)    Hyperlipidemia    Hypertension    Morbid obesity (HCC)    Myocardial infarction (HCC)    PONV (postoperative nausea and vomiting)    S/P CABG (coronary artery bypass graft)    a. 2012 - Dr. Laneta Simmers;  LIMA-LAD, SVG-OM 2, SVG-PDA.   S/P CABG x 3 09/07/11   Dr. Laneta Simmers;   LIMA-LAD, SVG-OM 2, SVG-PDA.   Sleep disturbance    a. Had sleep study  several years ago, wasn't told she has OSA but told she needs O2 at night.   Sleep disturbance    Had sleep study several years ago, wasn't told she has OSA but told she needs O2 at night   Stroke [facility])    Stroke [facility]) 2006   denies residual   TIA (transient ischemic attack)    Past Surgical History:  Procedure Laterality Date   ABDOMINAL AORTOGRAM W/LOWER EXTREMITY Bilateral 04/10/2019   Procedure: ABDOMINAL AORTOGRAM W/LOWER EXTREMITY;  Surgeon: Cephus Shelling, MD;  Location: [facility] INVASIVE CV LAB;  Service: Cardiovascular;  Laterality: Bilateral;   CAROTID ANGIOGRAPHY N/A 03/17/2017   Procedure: Carotid  Angiography;  Surgeon: Sherren Kerns, MD;  Location: [facility] INVASIVE CV LAB;  Service: Cardiovascular;  Laterality: N/A;   CAROTID PTA/STENT INTERVENTION Right 03/29/2017   Procedure: Carotid PTA/Stent Intervention;  Surgeon: Sherren Kerns, MD;  Location: [facility] INVASIVE CV LAB;  Service: Cardiovascular;  Laterality: Right;   CHOLECYSTECTOMY     CHOLECYSTECTOMY  1990's   CORONARY ARTERY BYPASS GRAFT     CORONARY ARTERY BYPASS GRAFT  09/07/2011   CABG X3; Procedure: CORONARY ARTERY BYPASS GRAFTING (CABG);  Surgeon: Alleen Borne, MD;  Location: [facility] OR;  Service: Open Heart Surgery;  Laterality: N/A;   LEFT HEART CATHETERIZATION WITH CORONARY ANGIOGRAM N/A 09/02/2011   Procedure: LEFT HEART CATHETERIZATION WITH CORONARY ANGIOGRAM;  Surgeon: Herby Abraham, MD;  Location: [facility] CATH LAB;  Service: Cardiovascular;  Laterality: N/A;   PERIPHERAL VASCULAR INTERVENTION Right 04/10/2019   Procedure: PERIPHERAL VASCULAR INTERVENTION;  Surgeon: Cephus Shelling, MD;  Location: [facility] INVASIVE CV LAB;  Service: Cardiovascular;  Laterality: Right;  superficial femoral artery   TUBAL LIGATION  1981     Current Meds  Medication Sig   albuterol (PROVENTIL HFA;VENTOLIN HFA) 108 (90 BASE) MCG/ACT inhaler Inhale 2 puffs into the lungs every 6 (six) hours as needed for wheezing or shortness of breath.   aspirin EC 81 MG tablet Take 1 tablet (81 mg total) by mouth daily.   atorvastatin (LIPITOR) 80 MG tablet Take 1 tablet (80 mg total) by mouth daily.   budesonide-formoterol (SYMBICORT) 160-4.5 MCG/ACT inhaler Inhale 2 puffs into the lungs 2 (two) times daily as needed (SOB).    clopidogrel (PLAVIX) 75 MG tablet Take 1 tablet (75 mg total) by mouth daily.   gabapentin (NEURONTIN) 300 MG capsule Take three capsules (900 mg dose) by mouth 3 (three) times a day.   insulin regular human CONCENTRATED (HUMULIN R) 500 UNIT/ML injection Inject 10-20 Units into the skin See admin instructions. Inject 20 units  in the morning (8AM) and 10-15units at night (10PM) with additional as needed. *Max daily dosage 65units/day   Liraglutide 18 MG/3ML SOPN Inject 1.8 mg into the skin daily.   metoprolol succinate (TOPROL-XL) 50 MG 24 hr tablet Take 1 tablet (50 mg total) by mouth daily. Take with or immediately following a meal.   OXYGEN Inhale 2.5 L/day into the lungs continuous.    pantoprazole (PROTONIX) 40 MG tablet Take 1 tablet (40 mg total) by mouth daily at 12 noon.   torsemide (DEMADEX) 20 MG tablet take 2 TABLETS (40 mg) EVERY MORNING and 1 TABLET (20 mg) EVERY EVENING]   VITAMIN D, CHOLECALCIFEROL, PO Take 1 tablet by mouth daily.     Allergies:   Lisinopril, Other, and Tramadol   Social History   Tobacco Use   Smoking status: Former Smoker    Packs/day: 0.50    Years: 25.00  Pack years: 12.50    Types: Cigarettes    Quit date: 11/11/2011    Years since quitting: 8.5   Smokeless tobacco: Former Forensic psychologist Use: Never used  Substance Use Topics   Alcohol use: No   Drug use: No     Family Hx: The patient's family history includes Cancer in her mother; Coronary artery disease in her father; Diabetes in her mother; Heart disease in an other family member; Heart failure in her mother.  ROS:   Please see the history of present illness.    No Fever, chills  or productive cough All other systems reviewed and are negative.   Recent Lipid Panel Lab Results  Component Value Date/Time   CHOL 139 10/16/2017 08:14 AM   TRIG 127 10/16/2017 08:14 AM   HDL 34 (L) 10/16/2017 08:14 AM   CHOLHDL 4.1 10/16/2017 08:14 AM   CHOLHDL 4.0 11/16/2016 03:03 PM   LDLCALC 80 10/16/2017 08:14 AM    Wt Readings from Last 3 Encounters:  05/19/20 280 lb (127 kg)  01/14/20 300 lb (136.1 kg)  07/15/19 (!) 327 lb (148.3 kg)     Objective:    Vital Signs:  BP 140/65    Pulse 73    Ht 5\' 6"  (1.676 m)    Wt 280 lb (127 kg)    BMI 45.19 kg/m    VITAL SIGNS:   reviewed NAD Answers questions appropriately Normal affect Remainder of physical examination not performed (telehealth visit; coronavirus pandemic)  ASSESSMENT & PLAN:    1. Coronary artery disease status post coronary artery bypass graft-patient denies chest pain.  Continue medical therapy with aspirin and statin. 2. Chronic diastolic congestive heart failure-based on history she appears to be doing reasonably well.  Continue Demadex at present dose. 3. Hypertension-blood pressure controlled.  Continue present medical regimen. 4. Hyperlipidemia-continue statin. 5. Carotid artery disease-she will need follow-up carotid Dopplers October 2021. 6. Chronic stage III kidney disease-followed by nephrology. 7. Peripheral vascular disease-followed by vascular surgery. 8. Morbid obesity-pt has lost 60 lbs.  COVID-19 Education: The importance of social distancing was discussed today.  Time:   Today, I have spent 17 minutes with the patient with telehealth technology discussing the above problems.     Medication Adjustments/Labs and Tests Ordered: Current medicines are reviewed at length with the patient today.  Concerns regarding medicines are outlined above.   Tests Ordered: No orders of the defined types were placed in this encounter.   Medication Changes: No orders of the defined types were placed in this encounter.   Follow Up:  In Person in 6 month(s)  Signed, November 2021, MD  05/19/2020 9:40 AM    [facility]

## 2020-05-19 ENCOUNTER — Encounter: Payer: Self-pay | Admitting: Cardiology

## 2020-05-19 ENCOUNTER — Telehealth (INDEPENDENT_AMBULATORY_CARE_PROVIDER_SITE_OTHER): Payer: Medicare PPO | Admitting: Cardiology

## 2020-05-19 VITALS — BP 140/65 | HR 73 | Ht 66.0 in | Wt 280.0 lb

## 2020-05-19 DIAGNOSIS — I739 Peripheral vascular disease, unspecified: Secondary | ICD-10-CM | POA: Diagnosis not present

## 2020-05-19 DIAGNOSIS — E7800 Pure hypercholesterolemia, unspecified: Secondary | ICD-10-CM

## 2020-05-19 DIAGNOSIS — I10 Essential (primary) hypertension: Secondary | ICD-10-CM

## 2020-05-19 DIAGNOSIS — I6523 Occlusion and stenosis of bilateral carotid arteries: Secondary | ICD-10-CM

## 2020-05-19 DIAGNOSIS — I25810 Atherosclerosis of coronary artery bypass graft(s) without angina pectoris: Secondary | ICD-10-CM | POA: Diagnosis not present

## 2020-05-19 NOTE — Patient Instructions (Signed)

## 2020-06-01 ENCOUNTER — Telehealth: Payer: Self-pay | Admitting: Cardiology

## 2020-06-01 NOTE — Telephone Encounter (Signed)
Would resume Demadex 40 mg in the morning and 20 mg in the afternoon which was previous dose. Olga Millers

## 2020-06-01 NOTE — Telephone Encounter (Signed)
Returned the call to the patient. She stated that last week at rehab she weighed 192 pounds so the provider at the facility told her to decrease the Torsemide to 20 mg once daily. This week she weighed 262 pounds. She stated that they realize that they made an error with her weight last week but now do not have an accurate weight. They are unable to know if she has gained any weight with the Torsemide 20 mg once daily dose change.   She has been on Torsemide 20 mg once daily for a week now and wants to know if she should go back on the 40 mg in the morning and 20 mg in the afternoon dose. She denies any excess swelling and shortness of breath. She is nervous that she will gain fluid if she stays on the current dose.   She has been advised that she should let the provider there know at the facility if she develops any swelling or shortness of breath so they may reevaluate her dosage of torsemide but we will let Dr. Jens Som know as well for any recommendations.

## 2020-06-01 NOTE — Telephone Encounter (Signed)
Advised patient and will fax to [phone]

## 2020-06-01 NOTE — Telephone Encounter (Signed)
Pt states that she is in a rehab facility for a broken ankle. States they take her weight and thought she has lost 60 lbs so the doctor told her to take only 1 torsemide (DEMADEX) 20 MG tablet instead on 3. They realized something was wrong with the scale, so they did it the old fashioned way and found out that she only lost 20 lbs. Pt wants to know should she start taking the 3 pills a day torsemide (DEMADEX) 20 MG tablet.

## 2020-06-02 ENCOUNTER — Telehealth: Payer: Self-pay | Admitting: Cardiology

## 2020-06-02 LAB — HM DIABETES EYE EXAM

## 2020-06-02 NOTE — Telephone Encounter (Signed)
New message:     Patient calling and would like for some one to call her. Please call patient.

## 2020-06-02 NOTE — Telephone Encounter (Signed)
Called and spoke to patient. She states that she spoke to a RN yesterday who was going to fax over her orders for torsemide 40 mg in the AM and 20 mg in the PM. She states that the [phone] is no longer a good fax number and that it needs to be faxed to [phone] Attn: Gunnar Fusi. Made patient aware that I would fax orders to the updated order. She verbalized understanding and thanked me for the call. Confirmation received that fax was successful.

## 2020-06-29 ENCOUNTER — Other Ambulatory Visit: Payer: Self-pay | Admitting: Cardiology

## 2020-08-20 ENCOUNTER — Telehealth: Payer: Self-pay | Admitting: Cardiology

## 2020-08-20 NOTE — Telephone Encounter (Signed)
Patient is calling to speak with Dr. Jens Som or his nurse

## 2020-08-20 NOTE — Telephone Encounter (Signed)
Returned call to pt she states that she emailed DMV forns about 3 weeks ago and was wondering the status. I do not see this in the chart. Pt will send again today.

## 2020-08-24 NOTE — Telephone Encounter (Signed)
Spoke with pt, she reports emailing the forms via my chart. Patient aware no message have come for [place] with the forms. Patient was given the fax number and ask for the forms to be faxed to [place].

## 2020-12-22 ENCOUNTER — Telehealth: Payer: Self-pay | Admitting: Cardiology

## 2020-12-22 NOTE — Telephone Encounter (Signed)
*  STAT* If patient is at the pharmacy, call can be transferred to refill team.   1. Which medications need to be refilled? (please list name of each medication and dose if known)    torsemide (DEMADEX) 20 MG tablet Potassium   2. Which pharmacy/location (including street and city if local pharmacy) is medication to be sent to?  [facility] - [place], [place] - [address]  3. Do they need a 30 day or 90 day supply? 90 day supply

## 2020-12-28 ENCOUNTER — Other Ambulatory Visit: Payer: Self-pay | Admitting: Cardiology

## 2021-01-12 NOTE — Telephone Encounter (Signed)
Called patient in regards to MyChart message.  She reports shortness of breath for at least 1 week She states she has been trying to wean herself off of home O2 and was previously able to NOT use it when going to doc visits, etc but now has she has to use it when using wheelchair in her home to move to her potty seat She is taking torsemide 2 tabs in AM and NONE in PM - unable to take b/c it keeps her up going to bathroom She has been only take in AM for at least 6 months, per her report She reports good UOP on current dose She asked HH infusion nurse about increasing torsemide dose.[facility] nurse was concerned about this given current renal insufficiency and being on IV vancomycin  She is unable to weigh - cannot be weight bearing  She has no LE edema  Routed to MD/RN to review and advise  She has visit in June - would like to be seen sooner if MD thinks appropriate

## 2021-01-18 ENCOUNTER — Telehealth: Payer: Self-pay | Admitting: Cardiology

## 2021-01-18 NOTE — Telephone Encounter (Signed)
Spoke with pt, she is tired all the time and is weaker than her normal. 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 She is having to use her inhaler more than in the past and is also using her oxygen more often. She reports that she is getting SOB with the sponge bath she is having to take because she is confined to a wheelchair because of ankle surgery. She has no increased edema in her feet but her abdomen is distended. She is not able to weigh. She does admit she has stopped taking the 20 mg of the torsemide in the evening because she was staying up all night going to the bathroom. Explained to the patient to take the second dose of torsemide at 1 pm in the afternoon and it should wear off by bedtime. Patient feels she needs to take another torsemide now and then she will start back taking 1 daily in the early afternoon. Patient voiced understanding to let us know if symptoms do not improve by the end of the week. Shanda Bumps aware of the above conversation.

## 2021-01-18 NOTE — Telephone Encounter (Signed)
Felicia Rivas is calling stating she has been seeing Felicia Rivas weekly for 3 weeks to give IV antibiotics for her infection. She states over the past 3 weeks she has been noticing decreasing air movement to posterior lung fields and her heart also sounds quieter every week. She reports she seems to be taking on fluid centrally more so each week. Felicia Rivas's O2 is 98% on 2 liters. She does not feel she needs a ER visit, but she is trending a little worse weekly. Felicia Rivas believes is her fluid issue could be resolved it could keep her out of the ER and feels she is getting weaker due to her heart and lungs working harder. She also reports Felicia Rivas's infection is almost gone. Please advise.

## 2021-02-01 ENCOUNTER — Other Ambulatory Visit: Payer: Self-pay

## 2021-02-01 DIAGNOSIS — I739 Peripheral vascular disease, unspecified: Secondary | ICD-10-CM

## 2021-02-01 DIAGNOSIS — Z7409 Other reduced mobility: Secondary | ICD-10-CM | POA: Insufficient documentation

## 2021-02-17 DEATH — deceased

## 2021-02-23 ENCOUNTER — Ambulatory Visit (HOSPITAL_COMMUNITY): Payer: Medicare Other | Attending: Vascular Surgery

## 2021-02-23 ENCOUNTER — Ambulatory Visit: Payer: Self-pay | Admitting: Vascular Surgery

## 2021-02-23 ENCOUNTER — Ambulatory Visit (HOSPITAL_COMMUNITY): Admission: RE | Admit: 2021-02-23 | Payer: Medicare Other | Source: Ambulatory Visit

## 2021-02-26 ENCOUNTER — Encounter: Payer: Self-pay | Admitting: Vascular Surgery

## 2021-03-16 ENCOUNTER — Encounter: Payer: Medicare Other | Admitting: Physician Assistant

## 2021-03-16 ENCOUNTER — Ambulatory Visit: Payer: Medicare PPO | Admitting: Cardiology

## 2021-03-17 NOTE — Progress Notes (Signed)
This encounter was created in error - please disregard.
